# Patient Record
Sex: Female | Born: 1950 | Race: White | Hispanic: No | Marital: Married | Smoking: Never smoker
Health system: Southern US, Community
[De-identification: ages and names within clinical notes are randomized; demographics above are authoritative.]

## PROBLEM LIST (undated history)

## (undated) DIAGNOSIS — N189 Chronic kidney disease, unspecified: Secondary | ICD-10-CM

## (undated) DIAGNOSIS — T7840XA Allergy, unspecified, initial encounter: Secondary | ICD-10-CM

## (undated) DIAGNOSIS — F329 Major depressive disorder, single episode, unspecified: Secondary | ICD-10-CM

## (undated) DIAGNOSIS — M545 Low back pain, unspecified: Secondary | ICD-10-CM

## (undated) DIAGNOSIS — F419 Anxiety disorder, unspecified: Secondary | ICD-10-CM

## (undated) DIAGNOSIS — K589 Irritable bowel syndrome without diarrhea: Secondary | ICD-10-CM

## (undated) DIAGNOSIS — Z9889 Other specified postprocedural states: Secondary | ICD-10-CM

## (undated) DIAGNOSIS — H9191 Unspecified hearing loss, right ear: Secondary | ICD-10-CM

## (undated) DIAGNOSIS — M329 Systemic lupus erythematosus, unspecified: Secondary | ICD-10-CM

## (undated) DIAGNOSIS — I480 Paroxysmal atrial fibrillation: Secondary | ICD-10-CM

## (undated) DIAGNOSIS — K5790 Diverticulosis of intestine, part unspecified, without perforation or abscess without bleeding: Secondary | ICD-10-CM

## (undated) DIAGNOSIS — M810 Age-related osteoporosis without current pathological fracture: Secondary | ICD-10-CM

## (undated) DIAGNOSIS — E119 Type 2 diabetes mellitus without complications: Secondary | ICD-10-CM

## (undated) DIAGNOSIS — R9439 Abnormal result of other cardiovascular function study: Secondary | ICD-10-CM

## (undated) DIAGNOSIS — G43909 Migraine, unspecified, not intractable, without status migrainosus: Secondary | ICD-10-CM

## (undated) DIAGNOSIS — G4730 Sleep apnea, unspecified: Secondary | ICD-10-CM

## (undated) DIAGNOSIS — I10 Essential (primary) hypertension: Secondary | ICD-10-CM

## (undated) DIAGNOSIS — E781 Pure hyperglyceridemia: Secondary | ICD-10-CM

## (undated) DIAGNOSIS — J189 Pneumonia, unspecified organism: Secondary | ICD-10-CM

## (undated) DIAGNOSIS — F32A Depression, unspecified: Secondary | ICD-10-CM

## (undated) DIAGNOSIS — G8929 Other chronic pain: Secondary | ICD-10-CM

## (undated) DIAGNOSIS — R112 Nausea with vomiting, unspecified: Secondary | ICD-10-CM

## (undated) DIAGNOSIS — R498 Other voice and resonance disorders: Secondary | ICD-10-CM

## (undated) DIAGNOSIS — M1990 Unspecified osteoarthritis, unspecified site: Secondary | ICD-10-CM

## (undated) DIAGNOSIS — K635 Polyp of colon: Secondary | ICD-10-CM

## (undated) DIAGNOSIS — E039 Hypothyroidism, unspecified: Secondary | ICD-10-CM

## (undated) HISTORY — PX: CARDIAC CATHETERIZATION: SHX172

## (undated) HISTORY — DX: Chronic kidney disease, unspecified: N18.9

## (undated) HISTORY — DX: Unspecified osteoarthritis, unspecified site: M19.90

## (undated) HISTORY — PX: COLONOSCOPY: SHX174

## (undated) HISTORY — DX: Other voice and resonance disorders: R49.8

## (undated) HISTORY — DX: Depression, unspecified: F32.A

## (undated) HISTORY — DX: Polyp of colon: K63.5

## (undated) HISTORY — DX: Allergy, unspecified, initial encounter: T78.40XA

## (undated) HISTORY — DX: Pure hyperglyceridemia: E78.1

## (undated) HISTORY — DX: Irritable bowel syndrome, unspecified: K58.9

## (undated) HISTORY — PX: FRACTURE SURGERY: SHX138

## (undated) HISTORY — DX: Migraine, unspecified, not intractable, without status migrainosus: G43.909

## (undated) HISTORY — DX: Sleep apnea, unspecified: G47.30

## (undated) HISTORY — DX: Age-related osteoporosis without current pathological fracture: M81.0

## (undated) HISTORY — DX: Diverticulosis of intestine, part unspecified, without perforation or abscess without bleeding: K57.90

## (undated) HISTORY — DX: Major depressive disorder, single episode, unspecified: F32.9

## (undated) HISTORY — PX: OTHER SURGICAL HISTORY: SHX169

## (undated) HISTORY — DX: Unspecified hearing loss, right ear: H91.91

## (undated) HISTORY — DX: Anxiety disorder, unspecified: F41.9

## (undated) HISTORY — DX: Paroxysmal atrial fibrillation: I48.0

## (undated) HISTORY — DX: Essential (primary) hypertension: I10

---

## 1986-02-22 HISTORY — PX: ABDOMINAL HYSTERECTOMY: SHX81

## 2003-05-08 DIAGNOSIS — Z86010 Personal history of colonic polyps: Secondary | ICD-10-CM

## 2003-05-08 DIAGNOSIS — Z860101 Personal history of adenomatous and serrated colon polyps: Secondary | ICD-10-CM

## 2003-05-08 HISTORY — DX: Personal history of adenomatous and serrated colon polyps: Z86.0101

## 2003-05-08 HISTORY — DX: Personal history of colonic polyps: Z86.010

## 2006-02-22 HISTORY — PX: COCHLEAR IMPLANT: SUR684

## 2009-07-17 ENCOUNTER — Ambulatory Visit (HOSPITAL_COMMUNITY): Admission: RE | Admit: 2009-07-17 | Discharge: 2009-07-17 | Payer: Self-pay | Admitting: Otolaryngology

## 2010-05-11 LAB — DIFFERENTIAL
Eosinophils Absolute: 0.2 10*3/uL (ref 0.0–0.7)
Eosinophils Relative: 2 % (ref 0–5)
Lymphocytes Relative: 30 % (ref 12–46)
Monocytes Absolute: 0.6 10*3/uL (ref 0.1–1.0)
Neutrophils Relative %: 61 % (ref 43–77)

## 2010-05-11 LAB — COMPREHENSIVE METABOLIC PANEL
ALT: 24 U/L (ref 0–35)
AST: 17 U/L (ref 0–37)
Calcium: 9.7 mg/dL (ref 8.4–10.5)
Chloride: 105 mEq/L (ref 96–112)
Creatinine, Ser: 0.84 mg/dL (ref 0.4–1.2)
Glucose, Bld: 95 mg/dL (ref 70–99)
Potassium: 3.7 mEq/L (ref 3.5–5.1)
Sodium: 140 mEq/L (ref 135–145)

## 2010-05-11 LAB — CBC
HCT: 38.7 % (ref 36.0–46.0)
MCV: 86.7 fL (ref 78.0–100.0)
Platelets: 302 10*3/uL (ref 150–400)
RBC: 4.46 MIL/uL (ref 3.87–5.11)
RDW: 14.4 % (ref 11.5–15.5)
WBC: 9.5 10*3/uL (ref 4.0–10.5)

## 2010-05-11 LAB — PROTIME-INR
INR: 1.04 (ref 0.00–1.49)
Prothrombin Time: 13.5 seconds (ref 11.6–15.2)

## 2010-05-11 LAB — URINALYSIS, ROUTINE W REFLEX MICROSCOPIC
Bilirubin Urine: NEGATIVE
Specific Gravity, Urine: 1.021 (ref 1.005–1.030)
Urobilinogen, UA: 0.2 mg/dL (ref 0.0–1.0)

## 2010-05-11 LAB — GLUCOSE, CAPILLARY
Glucose-Capillary: 132 mg/dL — ABNORMAL HIGH (ref 70–99)
Glucose-Capillary: 138 mg/dL — ABNORMAL HIGH (ref 70–99)

## 2010-05-11 LAB — APTT: aPTT: 27 seconds (ref 24–37)

## 2013-04-05 LAB — HM COLONOSCOPY

## 2014-09-30 DIAGNOSIS — I4819 Other persistent atrial fibrillation: Secondary | ICD-10-CM | POA: Insufficient documentation

## 2014-09-30 DIAGNOSIS — G4733 Obstructive sleep apnea (adult) (pediatric): Secondary | ICD-10-CM | POA: Insufficient documentation

## 2014-09-30 DIAGNOSIS — E785 Hyperlipidemia, unspecified: Secondary | ICD-10-CM | POA: Insufficient documentation

## 2014-09-30 DIAGNOSIS — E119 Type 2 diabetes mellitus without complications: Secondary | ICD-10-CM | POA: Insufficient documentation

## 2014-09-30 DIAGNOSIS — I10 Essential (primary) hypertension: Secondary | ICD-10-CM | POA: Insufficient documentation

## 2016-04-12 LAB — BASIC METABOLIC PANEL
BUN: 16 mg/dL (ref 4–21)
CREATININE: 0.8 mg/dL (ref <=1.1)
GLUCOSE: 126 mg/dL
POTASSIUM: 3.8 mmol/L (ref 3.4–5.3)
Sodium: 141 mmol/L (ref 137–147)

## 2016-04-12 LAB — LIPID PANEL
CHOLESTEROL: 129 mg/dL (ref 0–200)
HDL: 37 mg/dL (ref 35–70)
LDL Cholesterol: 49 mg/dL
Triglycerides: 429 mg/dL — AB (ref 40–160)

## 2016-04-12 LAB — HEPATIC FUNCTION PANEL
ALK PHOS: 71 U/L (ref 25–125)
ALT: 14 U/L (ref 7–35)
AST: 13 U/L (ref 13–35)
BILIRUBIN, TOTAL: 0.3 mg/dL

## 2016-04-12 LAB — HEMOGLOBIN A1C: HEMOGLOBIN A1C: 6.8

## 2016-04-12 LAB — CBC AND DIFFERENTIAL
HCT: 38 % (ref 36–46)
Hemoglobin: 13 g/dL (ref 12.0–16.0)
PLATELETS: 361 10*3/uL (ref 150–399)
WBC: 8.5 10*3/mL

## 2016-04-12 LAB — TSH: TSH: 2.27 u[IU]/mL (ref <=5.90)

## 2016-04-12 LAB — MICROALBUMIN, URINE: MICROALB UR: 0.87

## 2016-04-13 LAB — HM DIABETES FOOT EXAM: HM DIABETIC FOOT EXAM: NORMAL

## 2016-04-13 LAB — HM DIABETES EYE EXAM

## 2016-05-14 ENCOUNTER — Ambulatory Visit
Admission: RE | Admit: 2016-05-14 | Discharge: 2016-05-14 | Disposition: A | Discharge status: Home / Self Care | Payer: Self-pay | Source: Ambulatory Visit | Attending: Internal Medicine | Admitting: Internal Medicine

## 2016-05-14 ENCOUNTER — Other Ambulatory Visit: Payer: Self-pay | Admitting: Internal Medicine

## 2016-05-14 DIAGNOSIS — M533 Sacrococcygeal disorders, not elsewhere classified: Secondary | ICD-10-CM

## 2016-06-29 ENCOUNTER — Ambulatory Visit (INDEPENDENT_AMBULATORY_CARE_PROVIDER_SITE_OTHER): Payer: BLUE CROSS/BLUE SHIELD | Admitting: Physician Assistant

## 2016-06-29 ENCOUNTER — Encounter: Payer: Self-pay | Admitting: Physician Assistant

## 2016-06-29 VITALS — BP 122/70 | HR 59 | Temp 98.3°F | Resp 14 | Ht 68.0 in | Wt 217.0 lb

## 2016-06-29 DIAGNOSIS — Z23 Encounter for immunization: Secondary | ICD-10-CM

## 2016-06-29 DIAGNOSIS — E785 Hyperlipidemia, unspecified: Secondary | ICD-10-CM | POA: Diagnosis not present

## 2016-06-29 DIAGNOSIS — Z1231 Encounter for screening mammogram for malignant neoplasm of breast: Secondary | ICD-10-CM

## 2016-06-29 DIAGNOSIS — Z780 Asymptomatic menopausal state: Secondary | ICD-10-CM

## 2016-06-29 DIAGNOSIS — Z1239 Encounter for other screening for malignant neoplasm of breast: Secondary | ICD-10-CM

## 2016-06-29 DIAGNOSIS — H8109 Meniere's disease, unspecified ear: Secondary | ICD-10-CM | POA: Insufficient documentation

## 2016-06-29 DIAGNOSIS — I10 Essential (primary) hypertension: Secondary | ICD-10-CM | POA: Diagnosis not present

## 2016-06-29 DIAGNOSIS — M5136 Other intervertebral disc degeneration, lumbar region: Secondary | ICD-10-CM

## 2016-06-29 DIAGNOSIS — I4891 Unspecified atrial fibrillation: Secondary | ICD-10-CM

## 2016-06-29 DIAGNOSIS — E119 Type 2 diabetes mellitus without complications: Secondary | ICD-10-CM

## 2016-06-29 NOTE — Progress Notes (Signed)
Patient presents to clinic today to establish care.  Previous PCP -- Dr. Rene Paci.   Acute Concerns: Has noted voice change over the past 2 years -- notes strain in voice.   Is not a smoker.  Sometimes dry cough.  Globus sensation from time to time. Denies dyspahgia or odynophagia. Endorses history of sinus drainage. No current symptoms.   Chronic Issues: Hypothyroidism -- Unspecified per patient. Currently on levothyroxine 75 mcg daily. Is taking daily as directed.  Denies any significant unexplained weight changes. Last TSH with previous PCP on 04/12/16. Normal on review. Will abstract results into chart.  Chronic Low Back Pain -- Is taking Cymbalta 60 mg daily for chronic lower back pain. Takes daily as directed. Patient states it is hard to tell to what extent the medication helps. Is currently seeing chiropractor 3 x week for 8 weeks. Has noted significant improvement in symptoms. Patient notes prior x-ray and MRI revealing some Is also followed by Orthopedics. Seeing about once per year.   Hyperlipidemia -- Is currently on Simvastatin 20 mg three times weekly. Notes good control of cholesterol with this regimen. .   Atrial Fibrillation/Hypertension -- Diagnosed 4 years ago. Is currently on regimen of Xarelto 20 mg daily, Norvasc 10 mg daily, Hydralazine 50 mg BID, Sotalol 80 mg BID and Diovan 160 mg daily. Is taking as directed. Patient denies history of DVT, PE, MI or CVA. Endorses + family history of CVA. Has been following a low-salt diet. Patient denies chest pain, palpitations, lightheadedness, dizziness, vision changes or frequent headaches. Is followed by Cardiology -- Dr. Clarene Essex.  Patient is requesting a new specialist in the area and is wondering if the amount of medications can potentially be reduced.  DM II -- Metformin 500 mg BID. Is taking medication as directed. Endorses diet is well overall but can always be improved. Gym at work  Past Medical History:  Diagnosis Date  .  Anxiety   . Arthritis   . Colon polyps   . Deafness in right ear   . Depression   . Diabetes mellitus without complication (HCC)   . Diverticulosis   . Hypertension   . Migraine   . Sleep apnea   . Thyroid disease     Past Surgical History:  Procedure Laterality Date  . ABDOMINAL HYSTERECTOMY  1988  . COCHLEAR IMPLANT  2008    No current outpatient prescriptions on file prior to visit.   No current facility-administered medications on file prior to visit.     Allergies  Allergen Reactions  . Doxycycline Rash  . Erythromycin Rash  . Lisinopril Cough  . Tetracyclines & Related Rash    Family History  Problem Relation Age of Onset  . Hypertension Mother   . Diabetes Mother   . Heart attack Mother   . Hypertension Father   . Heart attack Father   . Stroke Father   . Cancer Sister        Breast  . Hypertension Brother   . Cancer Brother        Prostate  . Diabetes Brother   . Dementia Brother   . Stroke Brother   . Heart attack Brother   . Hypertension Son   . Cancer Maternal Aunt        Breast  . Cancer Maternal Uncle        Lung    Social History   Social History  . Marital status: Married    Spouse name: N/A  .  Number of children: N/A  . Years of education: 1612   Occupational History  . General Manager    Social History Main Topics  . Smoking status: Never Smoker  . Smokeless tobacco: Never Used  . Alcohol use No  . Drug use: No  . Sexual activity: Yes   Other Topics Concern  . Not on file   Social History Narrative  . No narrative on file   Review of Systems  Constitutional: Negative for fever and weight loss.  Eyes: Negative for blurred vision.  Respiratory: Positive for cough. Negative for hemoptysis, sputum production, shortness of breath and wheezing.   Cardiovascular: Negative for chest pain and palpitations.  Gastrointestinal: Negative for abdominal pain, blood in stool and melena.  Neurological: Negative for dizziness and loss  of consciousness.  Psychiatric/Behavioral: Negative for depression and suicidal ideas. The patient is not nervous/anxious.    BP 122/70   Pulse (!) 59   Temp 98.3 F (36.8 C) (Oral)   Resp 14   Ht 5\' 8"  (1.727 m)   Wt 217 lb (98.4 kg)   SpO2 96%   BMI 32.99 kg/m   Physical Exam  Constitutional: She is oriented to person, place, and time and well-developed, well-nourished, and in no distress.  HENT:  Head: Normocephalic and atraumatic.  Right Ear: External ear normal.  Left Ear: External ear normal.  Nose: Nose normal.  Mouth/Throat: Oropharynx is clear and moist. No oropharyngeal exudate.  Eyes: Conjunctivae are normal. Pupils are equal, round, and reactive to light.  Neck: Neck supple.  Cardiovascular: Normal rate, regular rhythm, normal heart sounds and intact distal pulses.   Pulmonary/Chest: Effort normal and breath sounds normal. No respiratory distress. She has no wheezes. She has no rales. She exhibits no tenderness.  Abdominal: Soft. Bowel sounds are normal. She exhibits no distension. There is no tenderness.  Lymphadenopathy:    She has no cervical adenopathy.  Neurological: She is alert and oriented to person, place, and time.  Skin: Skin is warm and dry. No rash noted.  Psychiatric: Affect normal.  Vitals reviewed.  Assessment/Plan: Atrial fibrillation [facility](HCC) [facility] placed per patient preference. Will continue current regimen for now.   Hypertension BP controlled. Asymptomatic. Will follow.  Hyperlipemia Taking statin as directed. Discussed dietary and exercise recommendations with the patient. Followed by Cardiology. We are setting her up with new specialist as requested. Will obtain prior records for review.  Diabetes mellitus, type 2 (HCC) Taking medications as directed and endorses tolerating well. Will obtain previous records with recent labs for review. Follow-up and repeat labs based on these numbers. Immunizations updated today. Foot  and eye examinations up-to-date per patient.   DDD (degenerative disc disease), lumbar With chronic pain. Is doing well with her Cymbalta.  Is getting adjustments with Chiropractor which is helping quite a lot. Will monitor.    Piedad ClimesMartin, Arthur Aydelotte Cody, PA-C

## 2016-06-29 NOTE — Progress Notes (Signed)
Pre visit review using our clinic review tool, if applicable. No additional management support is needed unless otherwise documented below in the visit note. 

## 2016-06-29 NOTE — Patient Instructions (Signed)
Please continue chronic medications.  We will work on getting an extended release Meformin approved through insurance so we can cut back on the number of pills you are taking per day.   You will be contacted for a mammogram, bone density test (screening for osteoporosis) and by Cardiology to be set up with a new provider. Discuss tweaking cardiac medications with them.   We will try to get records from previous providers for review. The lab results you have given me will be placed in your chart here.  Everything looks good. You are due for repeat check of diabetes in 1 month. Schedule that with me today before leaving.  It was a pleasure meeting you!

## 2016-07-01 ENCOUNTER — Encounter: Payer: Self-pay | Admitting: Emergency Medicine

## 2016-07-04 NOTE — Assessment & Plan Note (Signed)
Referral to [facility] placed per patient preference. Will continue current regimen for now.

## 2016-07-04 NOTE — Assessment & Plan Note (Signed)
BP controlled. Asymptomatic. Will follow.

## 2016-07-04 NOTE — Assessment & Plan Note (Signed)
With chronic pain. Is doing well with her Cymbalta.  Is getting adjustments with Chiropractor which is helping quite a lot. Will monitor.

## 2016-07-04 NOTE — Assessment & Plan Note (Signed)
Taking statin as directed. Discussed dietary and exercise recommendations with the patient. Followed by Cardiology. We are setting her up with new specialist as requested. Will obtain prior records for review.

## 2016-07-04 NOTE — Assessment & Plan Note (Signed)
Taking medications as directed and endorses tolerating well. Will obtain previous records with recent labs for review. Follow-up and repeat labs based on these numbers. Immunizations updated today. Foot and eye examinations up-to-date per patient.

## 2016-07-12 ENCOUNTER — Encounter: Payer: Self-pay | Admitting: Physician Assistant

## 2016-07-12 DIAGNOSIS — R490 Dysphonia: Secondary | ICD-10-CM

## 2016-07-13 ENCOUNTER — Other Ambulatory Visit (HOSPITAL_COMMUNITY): Payer: Self-pay | Admitting: Emergency Medicine

## 2016-07-13 MED ORDER — POTASSIUM CHLORIDE CRYS ER 20 MEQ PO TBCR: 20.0000 meq | EXTENDED_RELEASE_TABLET | Freq: Two times a day (BID) | ORAL | 0 refills | Status: DC

## 2016-07-13 NOTE — Telephone Encounter (Signed)
Please see patient's MyChart message. Check on status of Cardiology referral and check on mammogram as patient has not been contacted to schedule.

## 2016-07-21 ENCOUNTER — Encounter: Payer: Self-pay | Admitting: *Deleted

## 2016-07-28 ENCOUNTER — Other Ambulatory Visit: Payer: Self-pay | Admitting: Physician Assistant

## 2016-07-28 DIAGNOSIS — Z1231 Encounter for screening mammogram for malignant neoplasm of breast: Secondary | ICD-10-CM

## 2016-08-02 NOTE — Progress Notes (Signed)
History of Present Illness: Patient is a 66 y.o. female who presents to clinic today for follow-up of Diabetes Mellitus II, previously controlled without complication. Patient currently on medication regimen of Metformin 500 mg BID.  Is taking medications as directed. Endorses some sweets from time to time but eats mainly broiled or baked foods and low carb diet.  Denies polyuria, polydipsia or polyphagia. Denies vision changes, numbness or tingling in the hands or the neck. Is not checking blood glucose as directed.   Latest Maintenance: A1C --  Lab Results  Component Value Date   HGBA1C 6.8 04/12/2016   Diabetic Eye Exam -- up-to-date. No retinopathy. Urine Microalbumin -- Patient on ARB. Last Urine microalbumin 03/2016. Foot Exam -- up-to-date. Denies current concerns.  Past Medical History:  Diagnosis Date  . Anxiety   . Arthritis   . Colon polyps   . Deafness in right ear   . Depression   . Diabetes mellitus without complication (HCC)   . Diverticulosis   . Hypertension   . Migraine   . Sleep apnea   . Thyroid disease     Current Outpatient Prescriptions on File Prior to Visit  Medication Sig Dispense Refill  . amLODipine (NORVASC) 10 MG tablet Take 1 tablet by mouth daily.  2  . DULoxetine (CYMBALTA) 60 MG capsule Take 1 capsule by mouth daily.  4  . hydrALAZINE (APRESOLINE) 50 MG tablet Take 1 tablet by mouth 2 (two) times daily.  7  . indapamide (LOZOL) 2.5 MG tablet Take 1 tablet by mouth daily.  5  . levothyroxine (SYNTHROID, LEVOTHROID) 75 MCG tablet Take 1 tablet by mouth daily.  2  . metFORMIN (GLUCOPHAGE) 500 MG tablet Take 1 tablet by mouth 2 (two) times daily with a meal.  5  . Omega-3 Fatty Acids (FISH OIL) 1000 MG CAPS Take 1 capsule by mouth daily.    . potassium chloride SA (K-DUR,KLOR-CON) 20 MEQ tablet Take 1 tablet (20 mEq total) by mouth 2 (two) times daily. 60 tablet 0  . simvastatin (ZOCOR) 20 MG tablet Take 1 tablet by mouth 3 (three) times a week.   0  . sotalol (BETAPACE) 80 MG tablet Take 1 tablet by mouth 2 (two) times daily.  4  . valsartan (DIOVAN) 160 MG tablet Take 1 tablet by mouth daily.  5  . XARELTO 20 MG TABS tablet Take 1 tablet by mouth daily.  5   No current facility-administered medications on file prior to visit.     Allergies  Allergen Reactions  . Doxycycline Rash  . Erythromycin Rash  . Lisinopril Cough  . Tetracyclines & Related Rash    Family History  Problem Relation Age of Onset  . Hypertension Mother   . Diabetes Mother   . Heart attack Mother   . Hypertension Father   . Heart attack Father   . Stroke Father   . Cancer Sister        Breast  . Hypertension Brother   . Cancer Brother        Prostate  . Diabetes Brother   . Dementia Brother   . Stroke Brother   . Heart attack Brother   . Hypertension Son   . Cancer Maternal Aunt        Breast  . Cancer Maternal Uncle        Lung    Social History   Social History  . Marital status: Married    Spouse name: N/A  . Number  of children: N/A  . Years of education: 6   Occupational History  . General Manager    Social History Main Topics  . Smoking status: Never Smoker  . Smokeless tobacco: Never Used  . Alcohol use No  . Drug use: No  . Sexual activity: Yes   Other Topics Concern  . None   Social History Narrative  . None   Review of Systems: Pertinent ROS are listed in HPI  Physical Examination: BP 118/70   Pulse (!) 59   Temp 98 F (36.7 C) (Oral)   Resp 14   Ht 5\' 8"  (1.727 m)   Wt 218 lb (98.9 kg)   SpO2 98%   BMI 33.15 kg/m  General appearance: alert, cooperative, appears stated age and no distress Lungs: clear to auscultation bilaterally Heart: regular rate and rhythm, S1, S2 normal, no murmur, click, rub or gallop Neurologic: Alert and oriented X 3, normal strength and tone. Normal symmetric reflexes. Normal coordination and gait   Assessment/Plan: Atrial fibrillation (HCC) Managed by Cardiology.  Continue current regimen.  Diabetes mellitus, type 2 (HCC) Taking medications as directed. Up-to-date on health maintenance. Repeat labs today. Diet and exercise reviewed. Will alter regimen based on results.

## 2016-08-03 ENCOUNTER — Encounter: Payer: Self-pay | Admitting: Physician Assistant

## 2016-08-03 ENCOUNTER — Ambulatory Visit (INDEPENDENT_AMBULATORY_CARE_PROVIDER_SITE_OTHER): Payer: BLUE CROSS/BLUE SHIELD | Admitting: Physician Assistant

## 2016-08-03 VITALS — BP 118/70 | HR 59 | Temp 98.0°F | Resp 14 | Ht 68.0 in | Wt 218.0 lb

## 2016-08-03 DIAGNOSIS — I482 Chronic atrial fibrillation, unspecified: Secondary | ICD-10-CM

## 2016-08-03 DIAGNOSIS — E119 Type 2 diabetes mellitus without complications: Secondary | ICD-10-CM | POA: Diagnosis not present

## 2016-08-03 LAB — COMPREHENSIVE METABOLIC PANEL
ALT: 21 U/L (ref 0–35)
AST: 14 U/L (ref 0–37)
Albumin: 4.1 g/dL (ref 3.5–5.2)
Alkaline Phosphatase: 66 U/L (ref 39–117)
BUN: 21 mg/dL (ref 6–23)
CALCIUM: 9.9 mg/dL (ref 8.4–10.5)
CHLORIDE: 105 meq/L (ref 96–112)
CO2: 28 meq/L (ref 19–32)
Creatinine, Ser: 0.91 mg/dL (ref 0.40–1.20)
GFR: 65.89 mL/min (ref >60.00)
Glucose, Bld: 139 mg/dL — ABNORMAL HIGH (ref 70–99)
POTASSIUM: 4.1 meq/L (ref 3.5–5.1)
SODIUM: 140 meq/L (ref 135–145)
Total Bilirubin: 0.4 mg/dL (ref 0.2–1.2)
Total Protein: 6.4 g/dL (ref 6.0–8.3)

## 2016-08-03 LAB — HEMOGLOBIN A1C: Hgb A1c MFr Bld: 7.1 % — ABNORMAL HIGH (ref 4.6–6.5)

## 2016-08-03 NOTE — Progress Notes (Signed)
Pre visit review using our clinic review tool, if applicable. No additional management support is needed unless otherwise documented below in the visit note. 

## 2016-08-03 NOTE — Assessment & Plan Note (Signed)
Taking medications as directed. Up-to-date on health maintenance. Repeat labs today. Diet and exercise reviewed. Will alter regimen based on results.

## 2016-08-03 NOTE — Assessment & Plan Note (Signed)
Managed by Cardiology. Continue current regimen 

## 2016-08-03 NOTE — Patient Instructions (Addendum)
Please go to the lab for blood work.  I will call you with your results.   Continue current medication regimen for now. For the back pain -- please try to take Tylenol Arthritis to help with pain. Start yoga and/or water aerobics.  Let me know if this starts to help. We may need to get an orthopedist or physical therapy back on board.  [facility] at [phone] to schedule your appointment.   Stop by the front desk to speak with [place] or Ashley Woodard about the mammogram and bone density test.   Follow-up will be based on results.   Diabetes Mellitus and Exercise Exercising regularly is important for your overall health, especially when you have diabetes (diabetes mellitus). Exercising is not only about losing weight. It has many health benefits, such as increasing muscle strength and bone density and reducing body fat and stress. This leads to improved fitness, flexibility, and endurance, all of which result in better overall health. Exercise has additional benefits for people with diabetes, including:  Reducing appetite.  Helping to lower and control blood glucose.  Lowering blood pressure.  Helping to control amounts of fatty substances (lipids) in the blood, such as cholesterol and triglycerides.  Helping the body to respond better to insulin (improving insulin sensitivity).  Reducing how much insulin the body needs.  Decreasing the risk for heart disease by: ? Lowering cholesterol and triglyceride levels. ? Increasing the levels of good cholesterol. ? Lowering blood glucose levels.  What is my activity plan? Your health care provider or certified diabetes educator can help you make a plan for the type and frequency of exercise (activity plan) that works for you. Make sure that you:  Do at least 150 minutes of moderate-intensity or vigorous-intensity exercise each week. This could be brisk walking, biking, or water aerobics. ? Do stretching and strength  exercises, such as yoga or weightlifting, at least 2 times a week. ? Spread out your activity over at least 3 days of the week.  Get some form of physical activity every day. ? Do not go more than 2 days in a row without some kind of physical activity. ? Avoid being inactive for more than 90 minutes at a time. Take frequent breaks to walk or stretch.  Choose a type of exercise or activity that you enjoy, and set realistic goals.  Start slowly, and gradually increase the intensity of your exercise over time.  What do I need to know about managing my diabetes?  Check your blood glucose before and after exercising. ? If your blood glucose is higher than 240 mg/dL (29.513.3 mmol/L) before you exercise, check your urine for ketones. If you have ketones in your urine, do not exercise until your blood glucose returns to normal.  Know the symptoms of low blood glucose (hypoglycemia) and how to treat it. Your risk for hypoglycemia increases during and after exercise. Common symptoms of hypoglycemia can include: ? Hunger. ? Anxiety. ? Sweating and feeling clammy. ? Confusion. ? Dizziness or feeling light-headed. ? Increased heart rate or palpitations. ? Blurry vision. ? Tingling or numbness around the mouth, lips, or tongue. ? Tremors or shakes. ? Irritability.  Keep a rapid-acting carbohydrate snack available before, during, and after exercise to help prevent or treat hypoglycemia.  Avoid injecting insulin into areas of the body that are going to be exercised. For example, avoid injecting insulin into: ? The arms, when playing tennis. ? The legs, when jogging.  Keep records of  your exercise habits. Doing this can help you and your health care provider adjust your diabetes management plan as needed. Write down: ? Food that you eat before and after you exercise. ? Blood glucose levels before and after you exercise. ? The type and amount of exercise you have done. ? When your insulin is  expected to peak, if you use insulin. Avoid exercising at times when your insulin is peaking.  When you start a new exercise or activity, work with your health care provider to make sure the activity is safe for you, and to adjust your insulin, medicines, or food intake as needed.  Drink plenty of water while you exercise to prevent dehydration or heat stroke. Drink enough fluid to keep your urine clear or pale yellow. This information is not intended to replace advice given to you by your health care provider. Make sure you discuss any questions you have with your health care provider. Document Released: 05/01/2003 Document Revised: 08/29/2015 Document Reviewed: 07/21/2015 Elsevier Interactive Patient Education  2018 [employer].

## 2016-08-05 ENCOUNTER — Encounter: Payer: Self-pay | Admitting: Physician Assistant

## 2016-08-07 ENCOUNTER — Other Ambulatory Visit: Payer: Self-pay | Admitting: Physician Assistant

## 2016-08-08 ENCOUNTER — Encounter: Payer: Self-pay | Admitting: Physician Assistant

## 2016-08-09 ENCOUNTER — Other Ambulatory Visit: Payer: Self-pay | Admitting: Emergency Medicine

## 2016-08-09 DIAGNOSIS — E119 Type 2 diabetes mellitus without complications: Secondary | ICD-10-CM

## 2016-08-09 MED ORDER — GLUCOSE BLOOD VI STRP
ORAL_STRIP | 6 refills | Status: DC
Start: 2016-08-09 — End: 2017-06-15

## 2016-08-09 MED ORDER — LANCETS ULTRA THIN 30G MISC: 1.0000 | Freq: Every day | 6 refills | Status: DC

## 2016-08-11 ENCOUNTER — Encounter: Payer: Self-pay | Admitting: Cardiology

## 2016-08-11 ENCOUNTER — Ambulatory Visit (INDEPENDENT_AMBULATORY_CARE_PROVIDER_SITE_OTHER): Payer: BLUE CROSS/BLUE SHIELD | Admitting: Cardiology

## 2016-08-11 ENCOUNTER — Telehealth (HOSPITAL_COMMUNITY): Payer: Self-pay | Admitting: *Deleted

## 2016-08-11 VITALS — BP 124/66 | HR 54 | Ht 68.0 in | Wt 216.4 lb

## 2016-08-11 DIAGNOSIS — I481 Persistent atrial fibrillation: Secondary | ICD-10-CM

## 2016-08-11 DIAGNOSIS — G4733 Obstructive sleep apnea (adult) (pediatric): Secondary | ICD-10-CM

## 2016-08-11 DIAGNOSIS — R9431 Abnormal electrocardiogram [ECG] [EKG]: Secondary | ICD-10-CM

## 2016-08-11 DIAGNOSIS — I4819 Other persistent atrial fibrillation: Secondary | ICD-10-CM

## 2016-08-11 DIAGNOSIS — I10 Essential (primary) hypertension: Secondary | ICD-10-CM

## 2016-08-11 MED ORDER — VALSARTAN-HYDROCHLOROTHIAZIDE 160-25 MG PO TABS: 1.0000 | ORAL_TABLET | Freq: Every day | ORAL | 11 refills | Status: DC

## 2016-08-11 NOTE — Patient Instructions (Addendum)
Medication Instructions:  1) STOP LOZEL 2) STOP DIOVAN 3) START DIOVAN HCT 160-25mg  daily  Labwork: You will have labs drawn the same day as your [facility] appointment.  Testing/Procedures: Dr. Mayford Knifeurner recommends you have a NUCLEAR STRESS TEST.  Dr. Mayford Knifeurner recommends you have a HOME SLEEP STUDY. Our CPAP assistant will call you to arrange this.  Follow-Up: Your physician recommends that you schedule a follow-up appointment in 2 WEEKS in the [facility].  Your physician wants you to follow-up in: 6 months with Dr. Mayford Knifeurner. You will receive a reminder letter in the mail two months in advance. If you don't receive a letter, please call our office to schedule the follow-up appointment.   Any Other Special Instructions Will Be Listed Below (If Applicable).     If you need a refill on your cardiac medications before your next appointment, please call your pharmacy.

## 2016-08-11 NOTE — Telephone Encounter (Signed)
Left message on voicemail per DPR in reference to upcoming appointment scheduled on 08/17/16 with detailed instructions given per Myocardial Perfusion Study Information Sheet for the test. LM to arrive 15 minutes early, and that it is imperative to arrive on time for appointment to keep from having the test rescheduled. If you need to cancel or reschedule your appointment, please call the office within 24 hours of your appointment. Failure to do so may result in a cancellation of your appointment, and a $50 no show fee. Phone number given for call back for any questions. Kezia Benevides Jacqueline     

## 2016-08-11 NOTE — Progress Notes (Addendum)
Cardiology Office Note    Date:  08/11/2016   ID:  Ashley Woodard, DOB 13-Jan-1951, MRN [id number]  PCP:  Waldon Merl, PA-C  Cardiologist:  Armanda Magic, MD   Chief Complaint  Patient presents with  . Atrial Fibrillation  . Hypertension  . Sleep Apnea    History of Present Illness:  Ashley Woodard is is referred here to Cardiology today for evaluation of atrial fibrillation at the request of Marcelline Mates, PAC.  Ashley Woodard is a 66 y.o. female with a history of DM, HTN, OSA and persistent atrial fibrillation.  She was followed in the past by a Cardiologist in [place] and is now moving her care here.  She has had atrial fibrillation off and on for about 8 years.  She has never had a cardioversion.  She rarely will have a breakthrough.  She has a history of OSA but was mild and she did not want to pursue CPAP.  She denies any chest pain or pressure, SOB, DOE, PND, orthopnea, awakening gasping for air, dizziness, claudication or syncope.  She rarely will have some mild LE edema.      Past Medical History:  Diagnosis Date  . Anxiety   . Arthritis   . Colon polyps   . Deafness in right ear   . Depression   . Diabetes mellitus without complication (HCC)   . Diverticulosis   . Hypertension   . Migraine   . Sleep apnea   . Thyroid disease     Past Surgical History:  Procedure Laterality Date  . ABDOMINAL HYSTERECTOMY  1988  . COCHLEAR IMPLANT  2008    Current Medications: Current Meds  Medication Sig  . amLODipine (NORVASC) 10 MG tablet Take 1 tablet by mouth daily.  . DULoxetine (CYMBALTA) 60 MG capsule Take 1 capsule by mouth daily.  Marland Kitchen glucose blood (CONTOUR NEXT TEST) test strip Check blood sugars daily  . hydrALAZINE (APRESOLINE) 50 MG tablet Take 1 tablet by mouth 2 (two) times daily.  . indapamide (LOZOL) 2.5 MG tablet Take 1 tablet by mouth daily.  Marland Kitchen LANCETS ULTRA THIN 30G MISC 1 Stick by Does not apply route daily.  Marland Kitchen levothyroxine (SYNTHROID, LEVOTHROID) 75 MCG tablet  Take 1 tablet by mouth daily.  . metFORMIN (GLUCOPHAGE) 500 MG tablet Take 1 tablet by mouth 2 (two) times daily with a meal.  . Omega-3 Fatty Acids (FISH OIL) 1000 MG CAPS Take 1 capsule by mouth daily.  . potassium chloride SA (K-DUR,KLOR-CON) 20 MEQ tablet TAKE 1 TABLET BY MOUTH TWICE DAILY  . simvastatin (ZOCOR) 20 MG tablet Take 1 tablet by mouth 3 (three) times a week.  . sotalol (BETAPACE) 80 MG tablet Take 1 tablet by mouth 2 (two) times daily.  . valsartan (DIOVAN) 160 MG tablet Take 1 tablet by mouth daily.  Carlena Hurl 20 MG TABS tablet Take 1 tablet by mouth daily.    Allergies:   Doxycycline; Erythromycin; Lisinopril; and Tetracyclines & related   Social History   Social History  . Marital status: Married    Spouse name: N/A  . Number of children: N/A  . Years of education: 31   Occupational History  . General Manager    Social History Main Topics  . Smoking status: Never Smoker  . Smokeless tobacco: Never Used  . Alcohol use No  . Drug use: No  . Sexual activity: Yes   Other Topics Concern  . None   Social History Narrative  .  None     Family History:  The patient's family history includes Cancer in her brother, maternal aunt, maternal uncle, and sister; Dementia in her brother; Diabetes in her brother and mother; Heart attack in her brother, father, and mother; Hypertension in her brother, father, mother, and son; Stroke in her brother and father.   ROS:   Please see the history of present illness.    ROS All other systems reviewed and are negative.  No flowsheet data found.     PHYSICAL EXAM:   VS:  BP 124/66   Pulse (!) 54   Ht 5\' 8"  (1.727 m)   Wt 216 lb 6.4 oz (98.2 kg)   BMI 32.90 kg/m    GEN: Well nourished, well developed, in no acute distress  HEENT: normal  Neck: no JVD, carotid bruits, or masses Cardiac: RRR; no murmurs, rubs, or gallops,no edema.  Intact distal pulses bilaterally.  Respiratory:  clear to auscultation bilaterally,  normal work of breathing GI: soft, nontender, nondistended, + BS MS: no deformity or atrophy  Skin: warm and dry, no rash Neuro:  Alert and Oriented x 3, Strength and sensation are intact Psych: euthymic mood, full affect  Wt Readings from Last 3 Encounters:  08/11/16 216 lb 6.4 oz (98.2 kg)  08/03/16 218 lb (98.9 kg)  06/29/16 217 lb (98.4 kg)      Studies/Labs Reviewed:   EKG:  EKG is ordered today.  The ekg ordered today demonstrates sinus bradycardia at 54bpm with T abnormality in the anterior precordial leads.   Recent Labs: 04/12/2016: Hemoglobin 13.0; Platelets 361; TSH 2.27 08/03/2016: ALT 21; BUN 21; Creatinine, Ser 0.91; Potassium 4.1; Sodium 140   Lipid Panel    Component Value Date/Time   CHOL 129 04/12/2016   TRIG 429 (A) 04/12/2016   HDL 37 04/12/2016   LDLCALC 49 04/12/2016    Additional studies/ records that were reviewed today include:  Office visit notes from PCP    ASSESSMENT:    1. Persistent atrial fibrillation (HCC)   2. Essential hypertension   3. Abnormal EKG   4. OSA (obstructive sleep apnea)      PLAN:  In order of problems listed above:  1. Persistent atrial fibrillation - she is maintaining sinus bradycardia.  Her CHADS2VASC score is 4.  She will continue on Xarelto 20mg  daily and Betapace 80mg  BID.  QTC stable on EKG at . 2. HTN - Her BP is adequately controlled on exam today.  She will continue on amlodipine 10mg  daily and hydralazine 50mg  BID. She would like to get off some of her HTN meds so I have told her to stop her Lozol and I will change her Diovan to Diovan HCT 160/25mg  daily and repeat a BMET in1 week.  3. Abnormal EKG with T wave changes in the anterior precordial leads.  She has multiple CRFs including a strong family history of CAD, and also has HTN, DM and dyslipidemia. I will get a stress myoview to rule out silent ischemia.  4. OSA - this reportedly was mild in the past and she did not tolerate her CPAP mask.  She  used an oral device for a while but her dog ate it.  I will get a home sleep study to determine degree of OSA. 5.     Medication Adjustments/Labs and Tests Ordered: Current medicines are reviewed at length with the patient today.  Concerns regarding medicines are outlined above.  Medication changes, Labs and Tests ordered today are listed  in the Patient Instructions below.  There are no Patient Instructions on file for this visit.   Signed, Armanda Magicraci Pier Bosher, MD  08/11/2016 9:17 AM    [facility] [address], [place], [place]  [address] Phone: [phone]; Fax: [phone]

## 2016-08-16 ENCOUNTER — Telehealth: Payer: Self-pay | Admitting: *Deleted

## 2016-08-16 NOTE — Telephone Encounter (Addendum)
Home Sleep Study ordered through [facility] 08/16/2016.  Informed patient of upcoming home sleep study and patient understanding was verbalized. Patient [facility] Patient understands she will receive a call in a week or so. Patient understands to call if she does not receive the call in a timely manner. Patient agrees with treatment and thanked me for call Patient's last office notes (08/11/2016) [facility], order # [id number]. Fax# [phone].

## 2016-08-17 ENCOUNTER — Ambulatory Visit (HOSPITAL_COMMUNITY): Payer: BLUE CROSS/BLUE SHIELD | Attending: Cardiology

## 2016-08-17 DIAGNOSIS — R9439 Abnormal result of other cardiovascular function study: Secondary | ICD-10-CM | POA: Diagnosis not present

## 2016-08-17 DIAGNOSIS — I481 Persistent atrial fibrillation: Secondary | ICD-10-CM | POA: Diagnosis not present

## 2016-08-17 DIAGNOSIS — R9431 Abnormal electrocardiogram [ECG] [EKG]: Secondary | ICD-10-CM | POA: Diagnosis not present

## 2016-08-17 DIAGNOSIS — I4819 Other persistent atrial fibrillation: Secondary | ICD-10-CM

## 2016-08-17 LAB — MYOCARDIAL PERFUSION IMAGING
CHL CUP NUCLEAR SDS: 8
CHL CUP NUCLEAR SRS: 5
CHL CUP RESTING HR STRESS: 53 {beats}/min
CSEPPHR: 105 {beats}/min
LV dias vol: 105 mL (ref 46–106)
LVSYSVOL: 38 mL
NUC STRESS TID: 1.09
RATE: 0.31
SSS: 13

## 2016-08-17 MED ORDER — REGADENOSON 0.4 MG/5ML IV SOLN
0.4000 mg | Freq: Once | INTRAVENOUS | Status: AC
  Administered 2016-08-17: 0.4 mg via INTRAVENOUS

## 2016-08-17 MED ORDER — TECHNETIUM TC 99M TETROFOSMIN IV KIT
10.5000 | PACK | Freq: Once | INTRAVENOUS | Status: AC | PRN
  Administered 2016-08-17: 10.5 via INTRAVENOUS
  Filled 2016-08-17: qty 11

## 2016-08-17 MED ORDER — TECHNETIUM TC 99M TETROFOSMIN IV KIT
32.7000 | PACK | Freq: Once | INTRAVENOUS | Status: AC | PRN
  Administered 2016-08-17: 32.7 via INTRAVENOUS
  Filled 2016-08-17: qty 33

## 2016-08-19 ENCOUNTER — Ambulatory Visit (INDEPENDENT_AMBULATORY_CARE_PROVIDER_SITE_OTHER): Payer: BLUE CROSS/BLUE SHIELD | Admitting: Internal Medicine

## 2016-08-19 ENCOUNTER — Encounter: Payer: Self-pay | Admitting: Internal Medicine

## 2016-08-19 ENCOUNTER — Inpatient Hospital Stay (HOSPITAL_COMMUNITY)
Admission: EM | Admit: 2016-08-19 | Discharge: 2016-08-21 | DRG: 287 | Disposition: A | Discharge status: Home / Self Care | Payer: BLUE CROSS/BLUE SHIELD | Attending: Cardiology | Admitting: Cardiology

## 2016-08-19 ENCOUNTER — Encounter (HOSPITAL_COMMUNITY): Payer: Self-pay | Admitting: Emergency Medicine

## 2016-08-19 ENCOUNTER — Encounter: Payer: Self-pay | Admitting: *Deleted

## 2016-08-19 ENCOUNTER — Emergency Department (HOSPITAL_COMMUNITY): Payer: BLUE CROSS/BLUE SHIELD

## 2016-08-19 VITALS — BP 138/58 | HR 64 | Ht 68.0 in | Wt 215.0 lb

## 2016-08-19 DIAGNOSIS — R079 Chest pain, unspecified: Secondary | ICD-10-CM | POA: Diagnosis present

## 2016-08-19 DIAGNOSIS — Z7901 Long term (current) use of anticoagulants: Secondary | ICD-10-CM

## 2016-08-19 DIAGNOSIS — G4733 Obstructive sleep apnea (adult) (pediatric): Secondary | ICD-10-CM | POA: Diagnosis present

## 2016-08-19 DIAGNOSIS — Z79890 Hormone replacement therapy (postmenopausal): Secondary | ICD-10-CM

## 2016-08-19 DIAGNOSIS — I4819 Other persistent atrial fibrillation: Secondary | ICD-10-CM | POA: Diagnosis present

## 2016-08-19 DIAGNOSIS — Z881 Allergy status to other antibiotic agents status: Secondary | ICD-10-CM

## 2016-08-19 DIAGNOSIS — E119 Type 2 diabetes mellitus without complications: Secondary | ICD-10-CM | POA: Diagnosis present

## 2016-08-19 DIAGNOSIS — I480 Paroxysmal atrial fibrillation: Secondary | ICD-10-CM | POA: Diagnosis present

## 2016-08-19 DIAGNOSIS — I10 Essential (primary) hypertension: Secondary | ICD-10-CM | POA: Diagnosis present

## 2016-08-19 DIAGNOSIS — R9431 Abnormal electrocardiogram [ECG] [EKG]: Secondary | ICD-10-CM | POA: Diagnosis present

## 2016-08-19 DIAGNOSIS — Z888 Allergy status to other drugs, medicaments and biological substances status: Secondary | ICD-10-CM | POA: Diagnosis not present

## 2016-08-19 DIAGNOSIS — I25110 Atherosclerotic heart disease of native coronary artery with unstable angina pectoris: Secondary | ICD-10-CM | POA: Diagnosis present

## 2016-08-19 DIAGNOSIS — R9439 Abnormal result of other cardiovascular function study: Secondary | ICD-10-CM | POA: Diagnosis present

## 2016-08-19 DIAGNOSIS — Z9889 Other specified postprocedural states: Secondary | ICD-10-CM | POA: Diagnosis not present

## 2016-08-19 DIAGNOSIS — Z7984 Long term (current) use of oral hypoglycemic drugs: Secondary | ICD-10-CM | POA: Diagnosis not present

## 2016-08-19 DIAGNOSIS — I481 Persistent atrial fibrillation: Secondary | ICD-10-CM

## 2016-08-19 DIAGNOSIS — E785 Hyperlipidemia, unspecified: Secondary | ICD-10-CM | POA: Diagnosis present

## 2016-08-19 DIAGNOSIS — I200 Unstable angina: Secondary | ICD-10-CM

## 2016-08-19 DIAGNOSIS — I249 Acute ischemic heart disease, unspecified: Secondary | ICD-10-CM

## 2016-08-19 HISTORY — DX: Abnormal result of other cardiovascular function study: R94.39

## 2016-08-19 HISTORY — DX: Other specified postprocedural states: Z98.890

## 2016-08-19 LAB — CBC
HCT: 40.9 % (ref 36.0–46.0)
Hemoglobin: 13.3 g/dL (ref 12.0–15.0)
MCH: 27.8 pg (ref 26.0–34.0)
MCHC: 32.5 g/dL (ref 30.0–36.0)
MCV: 85.4 fL (ref 78.0–100.0)
PLATELETS: 350 10*3/uL (ref 150–400)
RBC: 4.79 MIL/uL (ref 3.87–5.11)
RDW: 15.2 % (ref 11.5–15.5)
WBC: 9 10*3/uL (ref 4.0–10.5)

## 2016-08-19 LAB — I-STAT TROPONIN, ED: TROPONIN I, POC: 0 ng/mL (ref 0.00–0.08)

## 2016-08-19 LAB — BASIC METABOLIC PANEL
ANION GAP: 9 (ref 5–15)
BUN: 15 mg/dL (ref 6–20)
CO2: 25 mmol/L (ref 22–32)
Calcium: 9.9 mg/dL (ref 8.9–10.3)
Chloride: 105 mmol/L (ref 101–111)
Creatinine, Ser: 0.92 mg/dL (ref 0.44–1.00)
Glucose, Bld: 107 mg/dL — ABNORMAL HIGH (ref 65–99)
POTASSIUM: 3.6 mmol/L (ref 3.5–5.1)
SODIUM: 139 mmol/L (ref 135–145)

## 2016-08-19 LAB — PROTIME-INR
INR: 1.4
Prothrombin Time: 17.3 seconds — ABNORMAL HIGH (ref 11.4–15.2)

## 2016-08-19 LAB — CBG MONITORING, ED: GLUCOSE-CAPILLARY: 102 mg/dL — AB (ref 65–99)

## 2016-08-19 LAB — GLUCOSE, CAPILLARY: Glucose-Capillary: 149 mg/dL — ABNORMAL HIGH (ref 65–99)

## 2016-08-19 LAB — TROPONIN I: Troponin I: <0.03 ng/mL (ref <0.03)

## 2016-08-19 MED ORDER — HYDRALAZINE HCL 50 MG PO TABS
50.0000 mg | ORAL_TABLET | Freq: Two times a day (BID) | ORAL | Status: DC
  Administered 2016-08-19 – 2016-08-21 (×4): 50 mg via ORAL
  Filled 2016-08-19 (×4): qty 1

## 2016-08-19 MED ORDER — ZOLPIDEM TARTRATE 5 MG PO TABS
5.0000 mg | ORAL_TABLET | Freq: Every evening | ORAL | Status: DC | PRN
  Administered 2016-08-19 – 2016-08-20 (×2): 5 mg via ORAL
  Filled 2016-08-19 (×2): qty 1

## 2016-08-19 MED ORDER — POTASSIUM CHLORIDE CRYS ER 20 MEQ PO TBCR
20.0000 meq | EXTENDED_RELEASE_TABLET | Freq: Two times a day (BID) | ORAL | Status: DC
  Administered 2016-08-19 – 2016-08-21 (×4): 20 meq via ORAL
  Filled 2016-08-19 (×3): qty 1
  Filled 2016-08-19: qty 2

## 2016-08-19 MED ORDER — VALSARTAN-HYDROCHLOROTHIAZIDE 160-25 MG PO TABS: 1.0000 | ORAL_TABLET | Freq: Every day | ORAL | Status: DC

## 2016-08-19 MED ORDER — HYDROCHLOROTHIAZIDE 25 MG PO TABS
25.0000 mg | ORAL_TABLET | Freq: Every day | ORAL | Status: DC
  Administered 2016-08-21: 25 mg via ORAL
  Filled 2016-08-19 (×2): qty 1

## 2016-08-19 MED ORDER — SIMVASTATIN 20 MG PO TABS
20.0000 mg | ORAL_TABLET | ORAL | Status: DC
  Administered 2016-08-19: 20 mg via ORAL
  Filled 2016-08-19 (×2): qty 1

## 2016-08-19 MED ORDER — INSULIN ASPART 100 UNIT/ML ~~LOC~~ SOLN: 0.0000 [IU] | Freq: Every day | SUBCUTANEOUS | Status: DC

## 2016-08-19 MED ORDER — RIVAROXABAN 20 MG PO TABS
20.0000 mg | ORAL_TABLET | Freq: Every day | ORAL | Status: DC
  Filled 2016-08-19: qty 1

## 2016-08-19 MED ORDER — ASPIRIN 81 MG PO CHEW: 324.0000 mg | CHEWABLE_TABLET | Freq: Once | ORAL | Status: DC

## 2016-08-19 MED ORDER — ALPRAZOLAM 0.25 MG PO TABS: 0.2500 mg | ORAL_TABLET | Freq: Two times a day (BID) | ORAL | Status: DC | PRN

## 2016-08-19 MED ORDER — LEVOTHYROXINE SODIUM 75 MCG PO TABS
75.0000 ug | ORAL_TABLET | Freq: Every day | ORAL | Status: DC
  Administered 2016-08-20 – 2016-08-21 (×2): 75 ug via ORAL
  Filled 2016-08-19 (×2): qty 1

## 2016-08-19 MED ORDER — INSULIN ASPART 100 UNIT/ML ~~LOC~~ SOLN
0.0000 [IU] | Freq: Three times a day (TID) | SUBCUTANEOUS | Status: DC
  Administered 2016-08-21: 2 [IU] via SUBCUTANEOUS

## 2016-08-19 MED ORDER — NITROGLYCERIN 2 % TD OINT
0.5000 [in_us] | TOPICAL_OINTMENT | Freq: Three times a day (TID) | TRANSDERMAL | Status: DC
  Filled 2016-08-19: qty 30

## 2016-08-19 MED ORDER — DULOXETINE HCL 60 MG PO CPEP
60.0000 mg | ORAL_CAPSULE | Freq: Every day | ORAL | Status: DC
  Administered 2016-08-20 – 2016-08-21 (×2): 60 mg via ORAL
  Filled 2016-08-19 (×2): qty 1

## 2016-08-19 MED ORDER — ASPIRIN 81 MG PO CHEW
324.0000 mg | CHEWABLE_TABLET | Freq: Once | ORAL | Status: AC
  Administered 2016-08-19: 324 mg via ORAL

## 2016-08-19 MED ORDER — ONDANSETRON HCL 4 MG/2ML IJ SOLN: 4.0000 mg | Freq: Four times a day (QID) | INTRAMUSCULAR | Status: DC | PRN

## 2016-08-19 MED ORDER — OMEGA-3-ACID ETHYL ESTERS 1 G PO CAPS
1.0000 g | ORAL_CAPSULE | Freq: Every day | ORAL | Status: DC
  Administered 2016-08-21: 1 g via ORAL
  Filled 2016-08-19 (×2): qty 1

## 2016-08-19 MED ORDER — AMLODIPINE BESYLATE 10 MG PO TABS
10.0000 mg | ORAL_TABLET | Freq: Every day | ORAL | Status: DC
  Administered 2016-08-20 – 2016-08-21 (×2): 10 mg via ORAL
  Filled 2016-08-19: qty 1
  Filled 2016-08-19: qty 2

## 2016-08-19 MED ORDER — SOTALOL HCL 80 MG PO TABS
80.0000 mg | ORAL_TABLET | Freq: Two times a day (BID) | ORAL | Status: DC
  Administered 2016-08-19 – 2016-08-21 (×4): 80 mg via ORAL
  Filled 2016-08-19 (×4): qty 1

## 2016-08-19 MED ORDER — ACETAMINOPHEN 325 MG PO TABS: 650.0000 mg | ORAL_TABLET | ORAL | Status: DC | PRN

## 2016-08-19 MED ORDER — ASPIRIN EC 81 MG PO TBEC
81.0000 mg | DELAYED_RELEASE_TABLET | Freq: Every day | ORAL | Status: DC
  Administered 2016-08-20: 81 mg via ORAL
  Filled 2016-08-19: qty 1

## 2016-08-19 MED ORDER — ASPIRIN EC 81 MG PO TBEC: 81.0000 mg | DELAYED_RELEASE_TABLET | Freq: Every day | ORAL | 3 refills | Status: DC

## 2016-08-19 MED ORDER — IRBESARTAN 150 MG PO TABS
150.0000 mg | ORAL_TABLET | Freq: Every day | ORAL | Status: DC
  Administered 2016-08-21: 150 mg via ORAL
  Filled 2016-08-19 (×2): qty 1

## 2016-08-19 MED ORDER — NITROGLYCERIN 0.4 MG SL SUBL: 0.4000 mg | SUBLINGUAL_TABLET | SUBLINGUAL | 6 refills | Status: DC | PRN

## 2016-08-19 MED ORDER — NITROGLYCERIN 0.4 MG SL SUBL: 0.4000 mg | SUBLINGUAL_TABLET | SUBLINGUAL | Status: DC | PRN

## 2016-08-19 NOTE — Patient Instructions (Addendum)
Medication Instructions:  Your physician has recommended you make the following change in your medication:  Start Aspirin 81 mg by mouth daily.  A prescription for nitroglycerin to take as needed for chest pain has been sent to your pharmacy.      Labwork: Lab work to be done today-BMP, CBC, PT  Testing/Procedures: Your physician has requested that you have a cardiac catheterization. Cardiac catheterization is used to diagnose and/or treat various heart conditions. Doctors may recommend this procedure for a number of different reasons. The most common reason is to evaluate chest pain. Chest pain can be a symptom of coronary artery disease (CAD), and cardiac catheterization can show whether plaque is narrowing or blocking your heart's arteries. This procedure is also used to evaluate the valves, as well as measure the blood flow and oxygen levels in different parts of your heart. For further information please visit [email or web address] Please follow instruction sheet, as given.  Scheduled for 08/23/16  Follow-Up: To be arranged after catheterization.  Any Other Special Instructions Will Be Listed Below (If Applicable).  Appointments for blood work and BP check on 08/26/16 have been cancelled due to catheterization.  Will be rescheduled after procedure if needed.    If you need a refill on your cardiac medications before your next appointment, please call your pharmacy.

## 2016-08-19 NOTE — ED Triage Notes (Signed)
Pt to ED via [facility] with c/o central chest pressure x's 2 days.  Pt denies any radiation of pain.  Denies any shortness of breath, nausea or other symptoms.  Pt was sent from her MD"s office

## 2016-08-19 NOTE — ED Notes (Signed)
Admitting MD at bedside at this time.

## 2016-08-19 NOTE — H&P (Signed)
Patient ID: Mardene Celesteikki R Hippert MRN: [id number], DOB/AGE: 1950-11-25   Admit date: 08/19/2016   Primary Physician: Waldon MerlMartin, William C, PA-C Primary Cardiologist: Dr Mayford Knifeurner  HPI: 66 y.o. year-old female with history of persistent atrial fibrillation-on Xarelto, hypertension, diabetes mellitus, and obstructive sleep apnea, who was seen by Dr End 08/19/16 for follow-up of abnormal stress test. She was seen on 08/11/16 by Dr. Mayford Knifeurner, which time she complained of some mild fatigue. Given her significant family history of coronary artery disease and other cardiovascular risk factors, she was referred for myocardial perfusion stress test. This study, performed 2 days ago, demonstrated a sizable area of anterior ischemia, consistent with an intermediate risk study. The patient presented to the office discuss further evaluation and management options, including cardiac catheterization. When she saw Dr End she denied chest pain, though she endorses vague chest pressure across the upper chest for the last 2 days following stress test. The pressure is not worsened by activity. Dr End felt that given her ongoing chest pressure and abnormal stress test, [facility] EMS for further evaluation and admission. She will likely need cardiac catheterization tomorrow, 08/20/16. Aspirin 324 mg was given in the office today. Rivaroxaban should be held in anticipation of catheterization tomorrow.    Problem List: Past Medical History:  Diagnosis Date  . Anxiety   . Arthritis   . Colon polyps   . Deafness in right ear   . Depression   . Diabetes mellitus without complication (HCC)   . Diverticulosis   . Hypertension   . Migraine   . Sleep apnea   . Thyroid disease     Past Surgical History:  Procedure Laterality Date  . ABDOMINAL HYSTERECTOMY  1988  . COCHLEAR IMPLANT  2008     Allergies:  Allergies  Allergen Reactions  . Doxycycline Rash  . Erythromycin Rash  .  Lisinopril Cough  . Tetracyclines & Related Rash     Home Medications  Past Medical History:  Diagnosis Date  . Anxiety   . Arthritis   . Colon polyps   . Deafness in right ear   . Depression   . Diabetes mellitus without complication (HCC)   . Diverticulosis   . Hypertension   . Migraine   . Sleep apnea   . Thyroid disease     Prior to Admission medications   Medication Sig Start Date End Date Taking? Authorizing Provider  amLODipine (NORVASC) 10 MG tablet Take 1 tablet by mouth daily. 05/27/16   [provider]  aspirin EC 81 MG tablet Take 1 tablet (81 mg total) by mouth daily. 08/19/16   End, Cristal Deerhristopher, MD  DULoxetine (CYMBALTA) 60 MG capsule Take 1 capsule by mouth daily. 06/23/16   [provider]  glucose blood (CONTOUR NEXT TEST) test strip Check blood sugars daily 08/09/16   Waldon MerlMartin, William C, PA-C  hydrALAZINE (APRESOLINE) 50 MG tablet Take 1 tablet by mouth 2 (two) times daily. 06/23/16   [provider]  LANCETS ULTRA THIN 30G MISC 1 Stick by Does not apply route daily. 08/09/16   Waldon MerlMartin, William C, PA-C  levothyroxine (SYNTHROID, LEVOTHROID) 75 MCG tablet Take 1 tablet by mouth daily. 06/23/16   [provider]  metFORMIN (GLUCOPHAGE) 500 MG tablet Take 1 tablet by mouth 2 (two) times daily with a meal. 06/23/16   [provider]  nitroGLYCERIN (NITROSTAT) 0.4 MG SL tablet Place 1 tablet (0.4 mg total) under the  tongue every 5 (five) minutes as needed for chest pain. 08/19/16 11/17/16  End, Cristal Deer, MD  Omega-3 Fatty Acids (FISH OIL) 1000 MG CAPS Take 1 capsule by mouth daily.    [provider]  potassium chloride SA (K-DUR,KLOR-CON) 20 MEQ tablet TAKE 1 TABLET BY MOUTH TWICE DAILY 08/09/16   Waldon Merl, PA-C  simvastatin (ZOCOR) 20 MG tablet Take 1 tablet by mouth 3 (three) times a week. 05/27/16   [provider]  sotalol (BETAPACE) 80 MG tablet Take 1 tablet by mouth 2 (two) times daily. 06/23/16   [provider]  valsartan-hydrochlorothiazide (DIOVAN-HCT) 160-25 MG tablet Take 1 tablet by mouth daily. 08/11/16 08/06/17  Quintella Reichert, MD  XARELTO 20 MG TABS tablet Take 1 tablet by mouth daily. 06/23/16   [provider]     Family History  Problem Relation Age of Onset  . Hypertension Mother   . Diabetes Mother   . Heart attack Mother   . Hypertension Father   . Heart attack Father   . Stroke Father   . Cancer Sister        Breast  . Hypertension Brother   . Cancer Brother        Prostate  . Diabetes Brother   . Dementia Brother   . Stroke Brother   . Heart attack Brother   . Hypertension Son   . Cancer Maternal Aunt        Breast  . Cancer Maternal Uncle        Lung     Social History   Social History  . Marital status: Married    Spouse name: N/A  . Number of children: N/A  . Years of education: 67   Occupational History  . General Manager    Social History Main Topics  . Smoking status: Never Smoker  . Smokeless tobacco: Never Used  . Alcohol use No  . Drug use: No  . Sexual activity: Yes   Other Topics Concern  . Not on file   Social History Narrative  . No narrative on file     Review of Systems: General: negative for chills, fever, night sweats or weight changes.  Cardiovascular: negative for dyspnea on exertion, edema, orthopnea, palpitations, paroxysmal nocturnal dyspnea or shortness of breath HEENT: negative for any visual disturbances, blindness, glaucoma Dermatological: negative for rash Respiratory: negative for cough, hemoptysis, or wheezing Urologic: negative for hematuria or dysuria Abdominal: negative for nausea, vomiting, diarrhea, bright red blood per rectum, melena, or hematemesis Neurologic: negative for visual changes, syncope, or dizziness Musculoskeletal: negative for back pain, joint pain, or swelling Psych: cooperative and appropriate All other systems reviewed and are otherwise negative except as noted  above.  Physical Exam: Pulse 64, temperature 98.2 F (36.8 C), temperature source Oral, resp. rate 16, height 5\' 8"  (1.727 m), weight 215 lb (97.5 kg), SpO2 97 %.   General:  Obese woman, seated comfortably in the exam room. HEENT: No conjunctival pallor or scleral icterus. Moist mucous membranes.  OP clear. Neck: Supple without lymphadenopathy, thyromegaly, JVD, or HJR. No carotid bruit. Lungs: Normal work of breathing. Clear to auscultation bilaterally without wheezes or crackles. Heart: Regular rate and rhythm without murmurs, rubs, or gallops. Non-displaced PMI. Abd: Bowel sounds present. Soft, NT/ND without hepatosplenomegaly Ext: No lower extremity edema. Radial, PT, and DP pulses are 2+ bilaterally. Skin: Warm and dry without rash.   Labs:  No results found for this or any previous visit (from the  past 24 hour(s)).   Radiology/Studies: No results found.  EKG: Normal sinus rhythm without significant abnormalities. Previously noted ST/T changes in V2 and V3 are no longer evident  ASSESSMENT AND PLAN:   Unstable angina  She will need cardiac catheterization tomorrow. Aspirin 324 mg was given in the office today. Rivaroxaban should be held in anticipation of catheterization tomorrow.  Paroxysmal atrial fibrillation No evidence of atrial fibrillation on EKG today. The patient has not had any symptoms of A. fib either. We will hold rivaroxaban, as above. Continue sotalol.  Hypertension Blood pressure is mildly elevated with systolic pressure of 138 (goal < 130). We will defer medication changes at this time.  PLAN:  Admit to telemetry, hold Xarelto Sunday low dose nitrate, cath scheduled for Monday (schedule will not allow cath tomorrow).    Jolene Provost, PA-C 08/19/2016, 6:21 PM [phone]

## 2016-08-19 NOTE — ED Provider Notes (Signed)
[facility]-EMERGENCY DEPT Provider Note   CSN: [id number] Arrival date & time: 08/19/16  1747     History   Chief Complaint Chief Complaint  Patient presents with  . Chest Pain    HPI Ashley Woodard is a 66 y.o. female.  HPI  66 year old with past medical history significant for A. fib (Xerelto), hypertension, DM, who presents to the emergency department with a 2 day history of chest pain. States that she started having some chest tightness after she had stress testing 2 days ago. Patient scheduled to have a cardiac catheterization next week. Patient did have an abnormal stress test. Denies associated nausea, vomiting, shortness of breath, diaphoresis. Nothing improves or worsens the chest pain. No prior history of PE/DVT. No recent cough or congestion. Had an aspirin prior to arrival with no change of chest pain. Currently rates her chest pain 1/10.  Past Medical History:  Diagnosis Date  . Anxiety   . Arthritis   . Colon polyps   . Deafness in right ear   . Depression   . Diabetes mellitus without complication (HCC)   . Diverticulosis   . Hypertension   . Migraine   . Sleep apnea   . Thyroid disease     Patient Active Problem List   Diagnosis Date Noted  . Chest pain with moderate risk of acute coronary syndrome 08/19/2016  . Abnormal EKG 08/11/2016  . DDD (degenerative disc disease), lumbar 06/29/2016  . Meniere's disease 06/29/2016  . Persistent atrial fibrillation (HCC) 09/30/2014  . Diabetes mellitus, type 2 (HCC) 09/30/2014  . Hyperlipemia 09/30/2014  . Hypertension 09/30/2014  . OSA (obstructive sleep apnea) 09/30/2014    Past Surgical History:  Procedure Laterality Date  . ABDOMINAL HYSTERECTOMY  1988  . COCHLEAR IMPLANT  2008    OB History    No data available       Home Medications    Prior to Admission medications   Medication Sig Start Date End Date Taking? Authorizing Provider  amLODipine (NORVASC) 10 MG tablet Take 1 tablet by mouth daily. 05/27/16   Yes [provider]  Doxylamine Succinate, Sleep, (SLEEP AID PO) Take 1 tablet by mouth at bedtime.   Yes [provider]  DULoxetine (CYMBALTA) 60 MG capsule Take 1 capsule by mouth daily. Take a sleep aide for sleep. She does not know the name 06/23/16  Yes [provider]  glucose blood (CONTOUR NEXT TEST) test strip Check blood sugars daily 08/09/16  Yes Waldon Merl, PA-C  hydrALAZINE (APRESOLINE) 50 MG tablet Take 1 tablet by mouth 2 (two) times daily. 06/23/16  Yes [provider]  LANCETS ULTRA THIN 30G MISC 1 Stick by Does not apply route daily. 08/09/16  Yes Waldon Merl, PA-C  levothyroxine (SYNTHROID, LEVOTHROID) 75 MCG tablet Take 1 tablet by mouth daily. 06/23/16  Yes [provider]  metFORMIN (GLUCOPHAGE) 500 MG tablet Take 1 tablet by mouth 2 (two) times daily with a meal. 06/23/16  Yes [provider]  Omega-3 Fatty Acids (FISH OIL) 1000 MG CAPS Take 1 capsule by mouth daily.   Yes [provider]  potassium chloride SA (K-DUR,KLOR-CON) 20 MEQ tablet TAKE 1 TABLET BY MOUTH TWICE DAILY 08/09/16  Yes Waldon Merl, PA-C  simvastatin (ZOCOR) 20 MG tablet Take 1 tablet by mouth 3 (three) times a week. 05/27/16  Yes [provider]  sotalol (BETAPACE) 80 MG tablet Take 1 tablet by mouth 2 (two) times daily. 06/23/16  Yes [provider]  Tetrahydrozoline HCl (EYE DROPS OP) Apply 2 drops to eye as needed (Dry eyes).   Yes [provider]  valsartan-hydrochlorothiazide (DIOVAN-HCT) 160-25 MG tablet Take 1 tablet by mouth daily. 08/11/16 08/06/17 Yes Turner, Cornelious Bryant, MD  XARELTO 20 MG TABS tablet Take 1 tablet by mouth at bedtime.  06/23/16  Yes [provider]  nitroGLYCERIN (NITROSTAT) 0.4 MG SL tablet Place 1 tablet (0.4 mg total) under the tongue every 5 (five) minutes as needed for chest pain. 08/19/16 11/17/16  End, Cristal Deer, MD    Family History Family History  Problem Relation Age of  Onset  . Hypertension Mother   . Diabetes Mother   . Heart attack Mother   . Hypertension Father   . Heart attack Father   . Stroke Father   . Cancer Sister        Breast  . Hypertension Brother   . Cancer Brother        Prostate  . Diabetes Brother   . Dementia Brother   . Stroke Brother   . Heart attack Brother   . Hypertension Son   . Cancer Maternal Aunt        Breast  . Cancer Maternal Uncle        Lung    Social History Social History  Substance Use Topics  . Smoking status: Never Smoker  . Smokeless tobacco: Never Used  . Alcohol use No     Allergies   Doxycycline; Erythromycin; Lisinopril; and Tetracyclines & related   Review of Systems Review of Systems  Constitutional: Negative for appetite change and fever.  HENT: Negative for congestion.   Respiratory: Positive for chest tightness. Negative for cough and shortness of breath.   Cardiovascular: Positive for chest pain.  Gastrointestinal: Negative for abdominal pain, diarrhea and vomiting.  Genitourinary: Negative for dysuria.  Musculoskeletal: Negative for back pain.  Skin: Negative for rash.  Neurological: Negative for syncope and weakness.  Psychiatric/Behavioral: Negative for behavioral problems.     Physical Exam Updated Vital Signs BP (!) 156/65 (BP Location: Right Arm)   Pulse 64   Temp 98.7 F (37.1 C) (Oral)   Resp 18   Ht 5\' 8"  (1.727 m)   Wt 97.5 kg (215 lb)   SpO2 98%   BMI 32.69 kg/m   Physical Exam  Constitutional: She is oriented to person, place, and time. She appears well-developed and well-nourished. No distress.  HENT:  Head: Atraumatic.  Eyes: EOM are normal.  Neck: Normal range of motion. No JVD present.  Cardiovascular: Normal rate, regular rhythm, normal heart sounds and intact distal pulses.   Pulmonary/Chest: Effort normal and breath sounds normal. No respiratory distress.  Abdominal: There is no tenderness.  Musculoskeletal: Normal range of motion. She  exhibits no edema.  No unilateral leg swelling  Neurological: She is alert and oriented to person, place, and time.  Skin: Skin is warm. No pallor.  Psychiatric: She has a normal mood and affect.     ED Treatments / Results  Labs (all labs ordered are listed, but only abnormal results are displayed) Labs Reviewed  BASIC METABOLIC PANEL - Abnormal; Notable for the following:       Result Value   Glucose, Bld 107 (*)    All other components within normal limits  PROTIME-INR - Abnormal; Notable for the following:    Prothrombin Time 17.3 (*)    All other components within normal limits  CBG MONITORING, ED - Abnormal; Notable for the  following:    Glucose-Capillary 102 (*)    All other components within normal limits  CBC  TROPONIN I  HIV ANTIBODY (ROUTINE TESTING)  TROPONIN I  TROPONIN I  I-STAT TROPOININ, ED  I-STAT TROPOININ, ED    EKG  EKG Interpretation None       Radiology Dg Chest 2 View  Result Date: 08/19/2016 CLINICAL DATA:  Chest pain for 3 days. EXAM: CHEST  2 VIEW COMPARISON:  07/16/2009 and prior radiographs FINDINGS: Upper limits normal heart size again noted. There is no evidence of focal airspace disease, pulmonary edema, suspicious pulmonary nodule/mass, pleural effusion, or pneumothorax. No acute bony abnormalities are identified. IMPRESSION: No evidence of acute cardiopulmonary disease. Electronically Signed   By: Harmon PierJeffrey  Hu M.D.   On: 08/19/2016 18:59    Procedures Procedures (including critical care time)  Medications Ordered in ED Medications  aspirin EC tablet 81 mg (not administered)  nitroGLYCERIN (NITROSTAT) SL tablet 0.4 mg (not administered)  acetaminophen (TYLENOL) tablet 650 mg (not administered)  ondansetron (ZOFRAN) injection 4 mg (not administered)  nitroGLYCERIN (NITROGLYN) 2 % ointment 0.5 inch (not administered)  ALPRAZolam (XANAX) tablet 0.25 mg (not administered)  zolpidem (AMBIEN) tablet 5 mg (not administered)  amLODipine  (NORVASC) tablet 10 mg (not administered)  DULoxetine (CYMBALTA) DR capsule 60 mg (not administered)  hydrALAZINE (APRESOLINE) tablet 50 mg (not administered)  levothyroxine (SYNTHROID, LEVOTHROID) tablet 75 mcg (not administered)  omega-3 acid ethyl esters (LOVAZA) capsule 1 g (not administered)  potassium chloride SA (K-DUR,KLOR-CON) CR tablet 20 mEq (not administered)  simvastatin (ZOCOR) tablet 20 mg (not administered)  sotalol (BETAPACE) tablet 80 mg (not administered)  rivaroxaban (XARELTO) tablet 20 mg (not administered)  insulin aspart (novoLOG) injection 0-15 Units (not administered)  insulin aspart (novoLOG) injection 0-5 Units (not administered)  irbesartan (AVAPRO) tablet 150 mg (not administered)    And  hydrochlorothiazide (HYDRODIURIL) tablet 25 mg (not administered)     Initial Impression / Assessment and Plan / ED Course  I have reviewed the triage vital signs and the nursing notes.  Pertinent labs & imaging results that were available during my care of the patient were reviewed by me and considered in my medical decision making (see chart for details).  66 year old female with multiple comorbidities, who presents to the emergency department with 2 days of chest tightness. Abnormal stress test 2 days ago, scheduled for cardiac catheterization next week. Received aspirin prior to arrival.  Clinical picture concerning for ACS. Doubt PE, low risk well's criteria, no dyspnea, on anticoagulation. Initial troponin Negative. No significant electrolyte abnormalities. No leukocytosis. Cardiology consulted, evaluated the patient in the emergency department.  Pt admitted to Cardiology. Pt stable for the floor.    Final Clinical Impressions(s) / ED Diagnoses   Final diagnoses:  ACS (acute coronary syndrome) [facility](HCC)  Chest pain, unspecified type    New Prescriptions Current Discharge Medication List       Corena HerterMumma, Golden Emile, MD 08/19/16 2156    Benjiman CorePickering, Nathan,  MD 08/20/16 [phone]

## 2016-08-19 NOTE — Progress Notes (Signed)
Follow-up Outpatient Visit Date: 08/19/2016  Primary Care Provider: Waldon Merl, PA-C [address] [place] [address]  Chief Complaint: Abnormal stress test  HPI:  Ashley Woodard is a 66 y.o. year-old female with history of persistent atrial fibrillation, hypertension, diabetes mellitus, and obstructive sleep apnea, who presents for follow-up of abnormal stress test. She was seen on 08/11/16 by Dr. Mayford Knife, which time she worse some mild fatigue. Given her significant family history of coronary artery disease and other cardiovascular risk factors, she is referred for myocardial perfusion stress test. This study, performed 2 days ago, demonstrated a sizable area of anterior ischemia, consistent with an intermediate risk study. The patient presents today to discuss further evaluation and management options, including cardiac catheterization. She denies chest pain, though she endorses vague chest pressure across the upper chest for the last 2 days following stress test. The pressure is not worsened by activity. She has not taken any medication for this. Ashley Woodard has not had any shortness of breath, palpitations, lightheadedness, orthopnea, and PND. She has noted fatigue for the last several months, though she continues to exercise. She last took rivaroxaban yesterday evening. She has not noted any bleeding.  --------------------------------------------------------------------------------------------------  Cardiovascular History & Procedures: Cardiovascular Problems:  Abnormal stress test  Persistent atrial fibrillation  Risk Factors:  Diabetes, hypertension, obesity, age greater than 72, and family history  Cath/PCI:  None  CV Surgery:  None  EP Procedures and Devices:  None  Non-Invasive Evaluation(s):  Pharmacologic MPI (08/17/16): Large in size, moderate intensity, reversible anteroapical, apical, and lateral wall perfusion defect suggestive of ischemia and less likely  shifting breast attenuation. LVEF 64%. Intermediate risk study.  Recent CV Pertinent Labs: Lab Results  Component Value Date   CHOL 129 04/12/2016   HDL 37 04/12/2016   LDLCALC 49 04/12/2016   TRIG 429 (A) 04/12/2016   INR 1.04 07/16/2009   K 4.1 08/03/2016   BUN 21 08/03/2016   BUN 16 04/12/2016   CREATININE 0.91 08/03/2016    Past medical and surgical history were reviewed and updated in EPIC.  Current Meds  Medication Sig  . amLODipine (NORVASC) 10 MG tablet Take 1 tablet by mouth daily.  . DULoxetine (CYMBALTA) 60 MG capsule Take 1 capsule by mouth daily.  . hydrALAZINE (APRESOLINE) 50 MG tablet Take 1 tablet by mouth 2 (two) times daily.  Marland Kitchen levothyroxine (SYNTHROID, LEVOTHROID) 75 MCG tablet Take 1 tablet by mouth daily.  . metFORMIN (GLUCOPHAGE) 500 MG tablet Take 1 tablet by mouth 2 (two) times daily with a meal.  . Omega-3 Fatty Acids (FISH OIL) 1000 MG CAPS Take 1 capsule by mouth daily.  . potassium chloride SA (K-DUR,KLOR-CON) 20 MEQ tablet TAKE 1 TABLET BY MOUTH TWICE DAILY  . simvastatin (ZOCOR) 20 MG tablet Take 1 tablet by mouth 3 (three) times a week.  . sotalol (BETAPACE) 80 MG tablet Take 1 tablet by mouth 2 (two) times daily.  . valsartan-hydrochlorothiazide (DIOVAN-HCT) 160-25 MG tablet Take 1 tablet by mouth daily.  Carlena Hurl 20 MG TABS tablet Take 1 tablet by mouth daily.    Allergies: Doxycycline; Erythromycin; Lisinopril; and Tetracyclines & related  Social History   Social History  . Marital status: Married    Spouse name: N/A  . Number of children: N/A  . Years of education: 95   Occupational History  . General Manager    Social History Main Topics  . Smoking status: Never Smoker  . Smokeless tobacco: Never  Used  . Alcohol use No  . Drug use: No  . Sexual activity: Yes   Other Topics Concern  . Not on file   Social History Narrative  . No narrative on file    Family History  Problem Relation Age of Onset  . Hypertension Mother     . Diabetes Mother   . Heart attack Mother   . Hypertension Father   . Heart attack Father   . Stroke Father   . Cancer Sister        Breast  . Hypertension Brother   . Cancer Brother        Prostate  . Diabetes Brother   . Dementia Brother   . Stroke Brother   . Heart attack Brother   . Hypertension Son   . Cancer Maternal Aunt        Breast  . Cancer Maternal Uncle        Lung    Review of Systems: A 12-system review of systems was performed and was negative except as noted in the HPI.  --------------------------------------------------------------------------------------------------  Physical Exam: BP (!) 138/58   Pulse 64   Ht 5\' 8"  (1.727 m)   Wt 215 lb (97.5 kg)   BMI 32.69 kg/m   General:  Obese woman, seated comfortably in the exam room. HEENT: No conjunctival pallor or scleral icterus. Moist mucous membranes.  OP clear. Neck: Supple without lymphadenopathy, thyromegaly, JVD, or HJR. No carotid bruit. Lungs: Normal work of breathing. Clear to auscultation bilaterally without wheezes or crackles. Heart: Regular rate and rhythm without murmurs, rubs, or gallops. Non-displaced PMI. Abd: Bowel sounds present. Soft, NT/ND without hepatosplenomegaly Ext: No lower extremity edema. Radial, PT, and DP pulses are 2+ bilaterally. Skin: Warm and dry without rash.  EKG:  Normal sinus rhythm without significant abnormalities. Previously noted ST/T changes in V2 and V3 are no longer evident.  Lab Results  Component Value Date   WBC 8.5 04/12/2016   HGB 13.0 04/12/2016   HCT 38 04/12/2016   MCV 86.7 07/16/2009   PLT 361 04/12/2016    Lab Results  Component Value Date   NA 140 08/03/2016   K 4.1 08/03/2016   CL 105 08/03/2016   CO2 28 08/03/2016   BUN 21 08/03/2016   CREATININE 0.91 08/03/2016   GLUCOSE 139 (H) 08/03/2016   ALT 21 08/03/2016    Lab Results  Component Value Date   CHOL 129 04/12/2016   HDL 37 04/12/2016   LDLCALC 49 04/12/2016   TRIG 429  (A) 04/12/2016   --------------------------------------------------------------------------------------------------  ASSESSMENT AND PLAN: Unstable angina Though Ashley Woodard looks well and denies chest pain, she endorses vague chest pressure that began after her stress test 2 days ago. Her EKG today does not show any acute ischemic changes. However, her myocardial perfusion stress test showed a large reversible defect consistent with ischemia (intermediate-risk study). Given ongoing chest pressure and abnormal stress test, we will refer the patient to the [facility] Emergency Department via EMS for further evaluation and admission. She will likely need cardiac catheterization tomorrow. Aspirin 324 mg was given in the office today. Rivaroxaban should be held in anticipation of catheterization tomorrow.  Paroxysmal atrial fibrillation No evidence of atrial fibrillation on EKG today. The patient has not had any symptoms of A. fib either. We will hold rivaroxaban, as above. Continue sotalol.  Hypertension Blood pressure is mildly elevated with systolic pressure of 138 (goal < 130). We will defer medication changes at this  time.  Follow-up: To be determined based on hospital course.  Yvonne Kendallhristopher Nilesh Stegall, MD 08/19/2016 3:58 PM

## 2016-08-20 ENCOUNTER — Other Ambulatory Visit: Payer: Self-pay

## 2016-08-20 ENCOUNTER — Ambulatory Visit (HOSPITAL_COMMUNITY)
Admission: RE | Admit: 2016-08-20 | Payer: BLUE CROSS/BLUE SHIELD | Source: Ambulatory Visit | Admitting: Internal Medicine

## 2016-08-20 ENCOUNTER — Encounter (HOSPITAL_COMMUNITY)
Admission: EM | Disposition: A | Discharge status: Home / Self Care | Payer: Self-pay | Source: Home / Self Care | Attending: Cardiology

## 2016-08-20 DIAGNOSIS — I200 Unstable angina: Secondary | ICD-10-CM | POA: Diagnosis present

## 2016-08-20 DIAGNOSIS — R9439 Abnormal result of other cardiovascular function study: Secondary | ICD-10-CM

## 2016-08-20 HISTORY — PX: LEFT HEART CATH AND CORONARY ANGIOGRAPHY: CATH118249

## 2016-08-20 LAB — TROPONIN I
Troponin I: <0.03 ng/mL (ref <0.03)
Troponin I: <0.03 ng/mL (ref <0.03)

## 2016-08-20 LAB — HIV ANTIBODY (ROUTINE TESTING W REFLEX): HIV Screen 4th Generation wRfx: NONREACTIVE

## 2016-08-20 LAB — GLUCOSE, CAPILLARY
GLUCOSE-CAPILLARY: 138 mg/dL — AB (ref 65–99)
Glucose-Capillary: 109 mg/dL — ABNORMAL HIGH (ref 65–99)
Glucose-Capillary: 166 mg/dL — ABNORMAL HIGH (ref 65–99)

## 2016-08-20 LAB — PROTIME-INR
INR: 1.17
Prothrombin Time: 14.9 seconds (ref 11.4–15.2)

## 2016-08-20 SURGERY — LEFT HEART CATH AND CORONARY ANGIOGRAPHY
Anesthesia: LOCAL

## 2016-08-20 MED ORDER — MIDAZOLAM HCL 2 MG/2ML IJ SOLN
INTRAMUSCULAR | Status: AC
Start: 2016-08-20 — End: 2016-08-20
  Filled 2016-08-20: qty 2

## 2016-08-20 MED ORDER — SODIUM CHLORIDE 0.9 % WEIGHT BASED INFUSION
3.0000 mL/kg/h | INTRAVENOUS | Status: AC
  Administered 2016-08-20: 3 mL/kg/h via INTRAVENOUS

## 2016-08-20 MED ORDER — SODIUM CHLORIDE 0.9% FLUSH: 3.0000 mL | Freq: Two times a day (BID) | INTRAVENOUS | Status: DC

## 2016-08-20 MED ORDER — LIDOCAINE HCL 1 % IJ SOLN
INTRAMUSCULAR | Status: AC
  Filled 2016-08-20: qty 20

## 2016-08-20 MED ORDER — VERAPAMIL HCL 2.5 MG/ML IV SOLN
INTRAVENOUS | Status: DC | PRN
  Administered 2016-08-20: 10 mL via INTRA_ARTERIAL

## 2016-08-20 MED ORDER — IOPAMIDOL (ISOVUE-370) INJECTION 76%
INTRAVENOUS | Status: DC | PRN
  Administered 2016-08-20: 55 mL via INTRAVENOUS

## 2016-08-20 MED ORDER — MIDAZOLAM HCL 2 MG/2ML IJ SOLN
INTRAMUSCULAR | Status: DC | PRN
  Administered 2016-08-20: 1 mg via INTRAVENOUS

## 2016-08-20 MED ORDER — HEPARIN SODIUM (PORCINE) 1000 UNIT/ML IJ SOLN
INTRAMUSCULAR | Status: AC
  Filled 2016-08-20: qty 1

## 2016-08-20 MED ORDER — SODIUM CHLORIDE 0.9 % IV SOLN: 250.0000 mL | INTRAVENOUS | Status: DC | PRN

## 2016-08-20 MED ORDER — ASPIRIN 81 MG PO CHEW: 81.0000 mg | CHEWABLE_TABLET | ORAL | Status: DC

## 2016-08-20 MED ORDER — SODIUM CHLORIDE 0.9 % WEIGHT BASED INFUSION: 3.0000 mL/kg/h | INTRAVENOUS | Status: DC

## 2016-08-20 MED ORDER — SODIUM CHLORIDE 0.9 % WEIGHT BASED INFUSION: 1.0000 mL/kg/h | INTRAVENOUS | Status: DC

## 2016-08-20 MED ORDER — FENTANYL CITRATE (PF) 100 MCG/2ML IJ SOLN
INTRAMUSCULAR | Status: DC | PRN
  Administered 2016-08-20: 50 ug via INTRAVENOUS

## 2016-08-20 MED ORDER — SODIUM CHLORIDE 0.9% FLUSH: 3.0000 mL | INTRAVENOUS | Status: DC | PRN

## 2016-08-20 MED ORDER — SODIUM CHLORIDE 0.9% FLUSH
3.0000 mL | Freq: Two times a day (BID) | INTRAVENOUS | Status: DC
  Administered 2016-08-20: 3 mL via INTRAVENOUS

## 2016-08-20 MED ORDER — RIVAROXABAN 20 MG PO TABS
20.0000 mg | ORAL_TABLET | Freq: Every day | ORAL | Status: AC
  Administered 2016-08-21: 20 mg via ORAL
  Filled 2016-08-20: qty 1

## 2016-08-20 MED ORDER — HEPARIN (PORCINE) IN NACL 2-0.9 UNIT/ML-% IJ SOLN
INTRAMUSCULAR | Status: AC | PRN
  Administered 2016-08-20: 1000 mL

## 2016-08-20 MED ORDER — SODIUM CHLORIDE 0.9 % IV SOLN: INTRAVENOUS | Status: AC

## 2016-08-20 MED ORDER — HEPARIN (PORCINE) IN NACL 2-0.9 UNIT/ML-% IJ SOLN
INTRAMUSCULAR | Status: AC
  Filled 2016-08-20: qty 1000

## 2016-08-20 MED ORDER — LIDOCAINE HCL (PF) 1 % IJ SOLN
INTRAMUSCULAR | Status: DC | PRN
  Administered 2016-08-20: 2 mL via SUBCUTANEOUS

## 2016-08-20 MED ORDER — HEPARIN SODIUM (PORCINE) 1000 UNIT/ML IJ SOLN
INTRAMUSCULAR | Status: DC | PRN
  Administered 2016-08-20: 5000 [IU] via INTRAVENOUS

## 2016-08-20 MED ORDER — FENTANYL CITRATE (PF) 100 MCG/2ML IJ SOLN
INTRAMUSCULAR | Status: AC
  Filled 2016-08-20: qty 2

## 2016-08-20 MED ORDER — ISOSORBIDE MONONITRATE ER 30 MG PO TB24
15.0000 mg | ORAL_TABLET | Freq: Every day | ORAL | Status: DC
  Administered 2016-08-20 – 2016-08-21 (×2): 15 mg via ORAL
  Filled 2016-08-20 (×2): qty 1

## 2016-08-20 MED ORDER — VERAPAMIL HCL 2.5 MG/ML IV SOLN
INTRAVENOUS | Status: AC
  Filled 2016-08-20: qty 2

## 2016-08-20 SURGICAL SUPPLY — 10 items

## 2016-08-20 NOTE — Interval H&P Note (Signed)
History and Physical Interval Note:  08/20/2016 5:36 PM  Ashley Woodard  has presented today for cardiac catheterization, with the diagnosis of chest pain and abnormal stress test. The various methods of treatment have been discussed with the patient and family. After consideration of risks, benefits and other options for treatment, the patient has consented to  Procedure(s): Left Heart Cath and Coronary Angiography (N/A) as a surgical intervention .  The patient's history has been reviewed, patient examined, no change in status, stable for surgery.  I have reviewed the patient's chart and labs.  Questions were answered to the patient's satisfaction.    Cath Lab Visit (complete for each Cath Lab visit)  Clinical Evaluation Leading to the Procedure:   ACS: Yes.    Non-ACS:    Anginal Classification: CCS IV  Anti-ischemic medical therapy: Maximal Therapy (2 or more classes of medications)  Non-Invasive Test Results: Intermediate-risk stress test findings: cardiac mortality 1-3%/year  Prior CABG: No previous CABG  Dondre Catalfamo

## 2016-08-20 NOTE — H&P (View-Only) (Signed)
Progress Note  Patient Name: Ashley Woodard Date of Encounter: 08/20/2016  Primary Cardiologist: Dr. Mayford Woodard   Subjective   Currently CP free. No dyspnea. No complaints currently.   Inpatient Medications    Scheduled Meds: . amLODipine  10 mg Oral Daily  . aspirin EC  81 mg Oral Daily  . DULoxetine  60 mg Oral Daily  . hydrALAZINE  50 mg Oral BID  . irbesartan  150 mg Oral Daily   And  . hydrochlorothiazide  25 mg Oral Daily  . insulin aspart  0-15 Units Subcutaneous TID WC  . insulin aspart  0-5 Units Subcutaneous QHS  . levothyroxine  75 mcg Oral QAC breakfast  . nitroGLYCERIN  0.5 inch Topical Q8H  . omega-3 acid ethyl esters  1 g Oral Daily  . potassium chloride SA  20 mEq Oral BID  . rivaroxaban  20 mg Oral Daily  . simvastatin  20 mg Oral Q M,W,F-1800  . sotalol  80 mg Oral BID   Continuous Infusions:  PRN Meds: acetaminophen, ALPRAZolam, nitroGLYCERIN, ondansetron (ZOFRAN) IV, zolpidem   Vital Signs    Vitals:   08/19/16 1915 08/19/16 1923 08/19/16 2011 08/20/16 0500  BP: (!) 120/110 140/74 (!) 156/65 139/61  Pulse: 63 64  (!) 58  Resp: 18 16 18 16   Temp:   98.7 F (37.1 C) 97.7 F (36.5 C)  TempSrc:   Oral Oral  SpO2: 96% 99% 98% 97%  Weight:    210 lb 11.2 oz (95.6 kg)  Height:        Intake/Output Summary (Last 24 hours) at 08/20/16 0924 Last data filed at 08/19/16 2200  Gross per 24 hour  Intake                0 ml  Output              800 ml  Net             -800 ml   Filed Weights   08/19/16 1819 08/20/16 0500  Weight: 215 lb (97.5 kg) 210 lb 11.2 oz (95.6 kg)    Telemetry    NSR- Personally Reviewed  ECG    Sinus bradycardia - Personally Reviewed  Physical Exam   GEN: No acute distress, moderately obese.   Neck: No JVD Cardiac: RRR, no murmurs, rubs, or gallops.  Respiratory: Clear to auscultation bilaterally. GI: Soft, nontender, non-distended  MS: No edema; No deformity. Neuro:  Nonfocal  Psych: Normal affect   Labs    Chemistry Recent Labs Lab 08/19/16 1818  NA 139  K 3.6  CL 105  CO2 25  GLUCOSE 107*  BUN 15  CREATININE 0.92  CALCIUM 9.9  GFRNONAA >60  GFRAA >60  ANIONGAP 9     Hematology Recent Labs Lab 08/19/16 1818  WBC 9.0  RBC 4.79  HGB 13.3  HCT 40.9  MCV 85.4  MCH 27.8  MCHC 32.5  RDW 15.2  PLT 350    Cardiac Enzymes Recent Labs Lab 08/19/16 1939 08/20/16 0104 08/20/16 0747  TROPONINI <0.03 <0.03 <0.03    Recent Labs Lab 08/19/16 1824  TROPIPOC 0.00     BNPNo results for input(s): BNP, PROBNP in the last 168 hours.   DDimer No results for input(s): DDIMER in the last 168 hours.   Radiology    Dg Chest 2 View  Result Date: 08/19/2016 CLINICAL DATA:  Chest pain for 3 days. EXAM: CHEST  2 VIEW COMPARISON:  07/16/2009 and  prior radiographs FINDINGS: Upper limits normal heart size again noted. There is no evidence of focal airspace disease, pulmonary edema, suspicious pulmonary nodule/mass, pleural effusion, or pneumothorax. No acute bony abnormalities are identified. IMPRESSION: No evidence of acute cardiopulmonary disease. Electronically Signed   By: Harmon Pier M.D.   On: 08/19/2016 18:59    Cardiac Studies   NST 08/17/16 Study Highlights     Nuclear stress EF: 64%.  T wave inversion was noted during stress in the V4, V5 and V6 leads.  There was no ST segment deviation noted during stress.  Defect 1: There is a large defect of moderate severity.  Findings consistent with ischemia.  This is an intermediate risk study.   Large size, moderate intensity reversible anteroapical, apical and lateral wall perfusion defect (SDS 8) suggestive of ischemia or less likely shifting breast artifact. LVEF 64% with normal wall motion. This is an intermediate risk study. Clinical correlation is advised.      Patient Profile     66 y.o.year-old femalewith a history of atrial fibrillation-on Xarelto, hypertension, diabetes mellitus, obstructive sleep apnea  and recent intermediate risk NST admitted for unstable angina.   Assessment & Plan    1. Unstable Angina: she has ruled out for MI with negative cardiac enzymes x 3. Recent  NST 08/17/16 showed Large size, moderate intensity reversible anteroapical, apical and lateral wall perfusion defect (SDS 8) suggestive of ischemia. Given she has ruled out for MI, plan is to peform LHC on Monday. If recurrent CP over the weekend, consider IV nitro. Will need to hold Sunday night's dose of Xarelto for cath. Continue ASA and statin. LDL is controlled at 49. No room for BB given bradycardia. She is currently CP free. She is ok with staying for observation over the weekend until cath.   2. PAF: NSR currently on telemetry. On Xarelto for a/c. Will need to hold Sunday Night's dose for cath.   3. DM: metformin is on hold for anticipated cath. Cover with SSI.   4. HLD: LDL controlled with statin.    Signed, Ashley Lis, PA-C  08/20/2016, 9:24 AM    History and all data above reviewed.  Patient examined.  I agree with the findings as above.  She had chest pain last night but none since then.  The patient exam reveals COR:RRR  ,  Lungs: Clear  ,  Abd: Positive bowel sounds, no rebound no guarding, Ext No edema  .  All available labs, radiology testing, previous records reviewed. Agree with documented assessment and plan. Cath later today if possible.  Monday likely as the schedule is full.    Ashley Woodard  10:25 AM  08/20/2016  \

## 2016-08-20 NOTE — Progress Notes (Signed)
Progress Note  Patient Name: Ashley Woodard Date of Encounter: 08/20/2016  Primary Cardiologist: Dr. Mayford Knifeurner   Subjective   Currently CP free. No dyspnea. No complaints currently.   Inpatient Medications    Scheduled Meds: . amLODipine  10 mg Oral Daily  . aspirin EC  81 mg Oral Daily  . DULoxetine  60 mg Oral Daily  . hydrALAZINE  50 mg Oral BID  . irbesartan  150 mg Oral Daily   And  . hydrochlorothiazide  25 mg Oral Daily  . insulin aspart  0-15 Units Subcutaneous TID WC  . insulin aspart  0-5 Units Subcutaneous QHS  . levothyroxine  75 mcg Oral QAC breakfast  . nitroGLYCERIN  0.5 inch Topical Q8H  . omega-3 acid ethyl esters  1 g Oral Daily  . potassium chloride SA  20 mEq Oral BID  . rivaroxaban  20 mg Oral Daily  . simvastatin  20 mg Oral Q M,W,F-1800  . sotalol  80 mg Oral BID   Continuous Infusions:  PRN Meds: acetaminophen, ALPRAZolam, nitroGLYCERIN, ondansetron (ZOFRAN) IV, zolpidem   Vital Signs    Vitals:   08/19/16 1915 08/19/16 1923 08/19/16 2011 08/20/16 0500  BP: (!) 120/110 140/74 (!) 156/65 139/61  Pulse: 63 64  (!) 58  Resp: 18 16 18 16   Temp:   98.7 F (37.1 C) 97.7 F (36.5 C)  TempSrc:   Oral Oral  SpO2: 96% 99% 98% 97%  Weight:    210 lb 11.2 oz (95.6 kg)  Height:        Intake/Output Summary (Last 24 hours) at 08/20/16 0924 Last data filed at 08/19/16 2200  Gross per 24 hour  Intake                0 ml  Output              800 ml  Net             -800 ml   Filed Weights   08/19/16 1819 08/20/16 0500  Weight: 215 lb (97.5 kg) 210 lb 11.2 oz (95.6 kg)    Telemetry    NSR- Personally Reviewed  ECG    Sinus bradycardia - Personally Reviewed  Physical Exam   GEN: No acute distress, moderately obese.   Neck: No JVD Cardiac: RRR, no murmurs, rubs, or gallops.  Respiratory: Clear to auscultation bilaterally. GI: Soft, nontender, non-distended  MS: No edema; No deformity. Neuro:  Nonfocal  Psych: Normal affect   Labs    Chemistry Recent Labs Lab 08/19/16 1818  NA 139  K 3.6  CL 105  CO2 25  GLUCOSE 107*  BUN 15  CREATININE 0.92  CALCIUM 9.9  GFRNONAA >60  GFRAA >60  ANIONGAP 9     Hematology Recent Labs Lab 08/19/16 1818  WBC 9.0  RBC 4.79  HGB 13.3  HCT 40.9  MCV 85.4  MCH 27.8  MCHC 32.5  RDW 15.2  PLT 350    Cardiac Enzymes Recent Labs Lab 08/19/16 1939 08/20/16 0104 08/20/16 0747  TROPONINI <0.03 <0.03 <0.03    Recent Labs Lab 08/19/16 1824  TROPIPOC 0.00     BNPNo results for input(s): BNP, PROBNP in the last 168 hours.   DDimer No results for input(s): DDIMER in the last 168 hours.   Radiology    Dg Chest 2 View  Result Date: 08/19/2016 CLINICAL DATA:  Chest pain for 3 days. EXAM: CHEST  2 VIEW COMPARISON:  07/16/2009 and  prior radiographs FINDINGS: Upper limits normal heart size again noted. There is no evidence of focal airspace disease, pulmonary edema, suspicious pulmonary nodule/mass, pleural effusion, or pneumothorax. No acute bony abnormalities are identified. IMPRESSION: No evidence of acute cardiopulmonary disease. Electronically Signed   By: Harmon Pier M.D.   On: 08/19/2016 18:59    Cardiac Studies   NST 08/17/16 Study Highlights     Nuclear stress EF: 64%.  T wave inversion was noted during stress in the V4, V5 and V6 leads.  There was no ST segment deviation noted during stress.  Defect 1: There is a large defect of moderate severity.  Findings consistent with ischemia.  This is an intermediate risk study.   Large size, moderate intensity reversible anteroapical, apical and lateral wall perfusion defect (SDS 8) suggestive of ischemia or less likely shifting breast artifact. LVEF 64% with normal wall motion. This is an intermediate risk study. Clinical correlation is advised.      Patient Profile     66 y.o.year-old femalewith a history of atrial fibrillation-on Xarelto, hypertension, diabetes mellitus, obstructive sleep apnea  and recent intermediate risk NST admitted for unstable angina.   Assessment & Plan    1. Unstable Angina: she has ruled out for MI with negative cardiac enzymes x 3. Recent  NST 08/17/16 showed Large size, moderate intensity reversible anteroapical, apical and lateral wall perfusion defect (SDS 8) suggestive of ischemia. Given she has ruled out for MI, plan is to peform LHC on Monday. If recurrent CP over the weekend, consider IV nitro. Will need to hold Sunday night's dose of Xarelto for cath. Continue ASA and statin. LDL is controlled at 49. No room for BB given bradycardia. She is currently CP free. She is ok with staying for observation over the weekend until cath.   2. PAF: NSR currently on telemetry. On Xarelto for a/c. Will need to hold Sunday Night's dose for cath.   3. DM: metformin is on hold for anticipated cath. Cover with SSI.   4. HLD: LDL controlled with statin.    Signed, Robbie Lis, PA-C  08/20/2016, 9:24 AM    History and all data above reviewed.  Patient examined.  I agree with the findings as above.  She had chest pain last night but none since then.  The patient exam reveals COR:RRR  ,  Lungs: Clear  ,  Abd: Positive bowel sounds, no rebound no guarding, Ext No edema  .  All available labs, radiology testing, previous records reviewed. Agree with documented assessment and plan. Cath later today if possible.  Monday likely as the schedule is full.    [name]  10:25 AM  08/20/2016  \

## 2016-08-20 NOTE — Care Management Note (Signed)
Case Management Note  Patient Details  Name: Ashley Woodard MRN: [id number] Date of Birth: 12/18/1950  Subjective/Objective:  Pt presented for Chest Pain. Cath possible later today if can work in the schedule. PTA- Independent.                    Action/Plan: No home needs identified.   Expected Discharge Date:                  Expected Discharge Plan:  Home/Self Care  In-House Referral:  NA  Discharge planning Services  CM Consult  Post Acute Care Choice:  NA Choice offered to:  NA  DME Arranged:  N/A DME Agency:  NA  HH Arranged:  NA HH Agency:  NA  Status of Service:  Completed, signed off  If discussed at Long Length of Stay Meetings, dates discussed:    Additional Comments:  Gala LewandowskyGraves-Bigelow, Ripken Rekowski Kaye, RN 08/20/2016, 4:48 PM

## 2016-08-20 NOTE — Plan of Care (Signed)
Problem: Physical Regulation: Goal: Ability to maintain clinical measurements within normal limits will improve Outcome: Progressing Pt denies any CP throughout shift. VSS. Resting comfortable. Cardiac rhythm NSR/SB. Continue to monitor.

## 2016-08-20 NOTE — Discharge Instructions (Addendum)
Information on my medicine - XARELTO (Rivaroxaban)  This medication education was reviewed with me or my healthcare representative as part of my discharge preparation.  The pharmacist that spoke with me during my hospital stay was:  Allena Katzaroline E Welles, [facility]  Why was Xarelto prescribed for you? Xarelto was prescribed for you to reduce the risk of a blood clot forming that can cause a stroke if you have a medical condition called atrial fibrillation (a type of irregular heartbeat).  What do you need to know about xarelto ? Take your Xarelto ONCE DAILY at the same time every day with your evening meal. If you have difficulty swallowing the tablet whole, you may crush it and mix in applesauce just prior to taking your dose.  Take Xarelto exactly as prescribed by your doctor and DO NOT stop taking Xarelto without talking to the doctor who prescribed the medication.  Stopping without other stroke prevention medication to take the place of Xarelto may increase your risk of developing a clot that causes a stroke.  Refill your prescription before you run out.  After discharge, you should have regular check-up appointments with your healthcare provider that is prescribing your Xarelto.  In the future your dose may need to be changed if your kidney function or weight changes by a significant amount.  What do you do if you miss a dose? If you are taking Xarelto ONCE DAILY and you miss a dose, take it as soon as you remember on the same day then continue your regularly scheduled once daily regimen the next day. Do not take two doses of Xarelto at the same time or on the same day.   Important Safety Information A possible side effect of Xarelto is bleeding. You should call your healthcare provider right away if you experience any of the following: ? Bleeding from an injury or your nose that does not stop. ? Unusual colored urine (red or dark brown) or unusual colored stools (red or  black). ? Unusual bruising for unknown reasons. ? A serious fall or if you hit your head (even if there is no bleeding).  Some medicines may interact with Xarelto and might increase your risk of bleeding while on Xarelto. To help avoid this, consult your healthcare provider or pharmacist prior to using any new prescription or non-prescription medications, including herbals, vitamins, non-steroidal anti-inflammatory drugs (NSAIDs) and supplements.  This website has more information on Xarelto: [email or web address].xarelto.[email or web address].  [facility] [address] [facility] at [phone] if any bleeding, swelling or drainage at cath site.  May shower, no tub baths for 48 hours for groin sticks. No lifting over 5 pounds for 3 days.  No Driving for 3 days  Heart Healthy Diabetic Diet No Metformin until Monday 08/23/16 watch diet closely.  We added Imdur to meds.

## 2016-08-21 ENCOUNTER — Encounter (HOSPITAL_COMMUNITY): Payer: Self-pay | Admitting: Cardiology

## 2016-08-21 DIAGNOSIS — E785 Hyperlipidemia, unspecified: Secondary | ICD-10-CM

## 2016-08-21 DIAGNOSIS — I10 Essential (primary) hypertension: Secondary | ICD-10-CM

## 2016-08-21 DIAGNOSIS — G4733 Obstructive sleep apnea (adult) (pediatric): Secondary | ICD-10-CM

## 2016-08-21 DIAGNOSIS — Z9889 Other specified postprocedural states: Secondary | ICD-10-CM

## 2016-08-21 DIAGNOSIS — R9439 Abnormal result of other cardiovascular function study: Secondary | ICD-10-CM

## 2016-08-21 DIAGNOSIS — E119 Type 2 diabetes mellitus without complications: Secondary | ICD-10-CM

## 2016-08-21 DIAGNOSIS — I481 Persistent atrial fibrillation: Secondary | ICD-10-CM

## 2016-08-21 DIAGNOSIS — R9431 Abnormal electrocardiogram [ECG] [EKG]: Secondary | ICD-10-CM

## 2016-08-21 DIAGNOSIS — R079 Chest pain, unspecified: Principal | ICD-10-CM

## 2016-08-21 HISTORY — DX: Other specified postprocedural states: Z98.890

## 2016-08-21 HISTORY — DX: Abnormal result of other cardiovascular function study: R94.39

## 2016-08-21 LAB — GLUCOSE, CAPILLARY: Glucose-Capillary: 128 mg/dL — ABNORMAL HIGH (ref 65–99)

## 2016-08-21 MED ORDER — METFORMIN HCL 500 MG PO TABS: 500.0000 mg | ORAL_TABLET | Freq: Two times a day (BID) | ORAL | 5 refills | Status: DC

## 2016-08-21 MED ORDER — ISOSORBIDE MONONITRATE ER 30 MG PO TB24: 15.0000 mg | ORAL_TABLET | Freq: Every day | ORAL | 6 refills | Status: DC

## 2016-08-21 NOTE — Progress Notes (Signed)
Progress Note  Patient Name: Ashley Woodard Date of Encounter: 08/21/2016  Primary Cardiologist: Dr. Mayford Knifeurner  Subjective   No chest pain and no SOB  Inpatient Medications    Scheduled Meds: . amLODipine  10 mg Oral Daily  . DULoxetine  60 mg Oral Daily  . hydrALAZINE  50 mg Oral BID  . irbesartan  150 mg Oral Daily   And  . hydrochlorothiazide  25 mg Oral Daily  . insulin aspart  0-15 Units Subcutaneous TID WC  . insulin aspart  0-5 Units Subcutaneous QHS  . isosorbide mononitrate  15 mg Oral Daily  . levothyroxine  75 mcg Oral QAC breakfast  . omega-3 acid ethyl esters  1 g Oral Daily  . potassium chloride SA  20 mEq Oral BID  . simvastatin  20 mg Oral Q M,W,F-1800  . sodium chloride flush  3 mL Intravenous Q12H  . sotalol  80 mg Oral BID   Continuous Infusions: . sodium chloride     PRN Meds: sodium chloride, acetaminophen, ALPRAZolam, nitroGLYCERIN, ondansetron (ZOFRAN) IV, sodium chloride flush, zolpidem   Vital Signs    Vitals:   08/20/16 1810 08/20/16 1845 08/20/16 2012 08/21/16 0600  BP: 135/70 132/68 (!) 141/63 135/68  Pulse: 64 (!) 57    Resp: 20   16  Temp:      TempSrc:      SpO2: 100% 92%  94%  Weight:    209 lb 11.2 oz (95.1 kg)  Height:        Intake/Output Summary (Last 24 hours) at 08/21/16 1033 Last data filed at 08/21/16 0900  Gross per 24 hour  Intake           1117.5 ml  Output             2300 ml  Net          -1182.5 ml   Filed Weights   08/19/16 1819 08/20/16 0500 08/21/16 0600  Weight: 215 lb (97.5 kg) 210 lb 11.2 oz (95.6 kg) 209 lb 11.2 oz (95.1 kg)    Telemetry    SR to SB - Personally Reviewed  ECG    SB with TWI in ant leads. - Personally Reviewed  Physical Exam   GEN: No acute distress.   Neck: No JVD Cardiac: RRR, no murmurs, rubs, or gallops. Rt radial cath site with 2+ pulses mild bruising no hematoma.  Respiratory: Clear to auscultation bilaterally. GI: Soft, nontender, non-distended  MS: No edema; No  deformity. Neuro:  Nonfocal  Psych: Normal affect   Labs    Chemistry Recent Labs Lab 08/19/16 1818  NA 139  K 3.6  CL 105  CO2 25  GLUCOSE 107*  BUN 15  CREATININE 0.92  CALCIUM 9.9  GFRNONAA >60  GFRAA >60  ANIONGAP 9     Hematology Recent Labs Lab 08/19/16 1818  WBC 9.0  RBC 4.79  HGB 13.3  HCT 40.9  MCV 85.4  MCH 27.8  MCHC 32.5  RDW 15.2  PLT 350    Cardiac Enzymes Recent Labs Lab 08/19/16 1939 08/20/16 0104 08/20/16 0747  TROPONINI <0.03 <0.03 <0.03    Recent Labs Lab 08/19/16 1824  TROPIPOC 0.00     BNPNo results for input(s): BNP, PROBNP in the last 168 hours.   DDimer No results for input(s): DDIMER in the last 168 hours.   Radiology    Dg Chest 2 View  Result Date: 08/19/2016 CLINICAL DATA:  Chest pain for 3  days. EXAM: CHEST  2 VIEW COMPARISON:  07/16/2009 and prior radiographs FINDINGS: Upper limits normal heart size again noted. There is no evidence of focal airspace disease, pulmonary edema, suspicious pulmonary nodule/mass, pleural effusion, or pneumothorax. No acute bony abnormalities are identified. IMPRESSION: No evidence of acute cardiopulmonary disease. Electronically Signed   By: Harmon Pier M.D.   On: 08/19/2016 18:59    Cardiac Studies   08/20/16 Procedures   Left Heart Cath and Coronary Angiography  Conclusion   Conclusions: 1. No angiographically significant coronary artery disease. 2. Normal left ventricular contraction. 3. Mildly elevated left ventricular filling pressure.  Recommendations: 1. Primary prevention of coronary artery disease. 2. Add low dose isosorbide mononitrate for possible component of microvascular dysfunction. 3. Restart rivaroxaban tomorrow evening if no evidence of bleeding right radial arteriotomy site. No need to continue aspirin in the setting of chronic anticoagulation. 4. Anticipate discharge tomorrow if no further chest pain. Follow-up as an outpatient with Dr. Mayford Knife.       Patient Profile     66 y.o. female with a history of atrial fibrillation-on Xarelto, hypertension, diabetes mellitus, obstructive sleep apnea and recent intermediate risk NST admitted for unstable angina.   Assessment & Plan    Unstable angina, neg MI, and recent NST with Large size, moderate intensity reversible anteroapical, apical and lateral wall perfusion defect (SDS 8) suggestive of ischemia.  Xarelto held and pt with cath yesterday --no angiographically sig. CAD.  Imdur added for microvascular dysfunction.   PAF NSR on tele- Xarelto resumed this AM   DM  Hold metformin 48 hours post cath.    HLD continue statin.  Sinus brady with HR to 48 at night in 50s this AM  Ambulate and d/c home.     Signed, Nada Boozer, NP  08/21/2016, 10:33 AM

## 2016-08-21 NOTE — Discharge Summary (Signed)
Discharge Summary    Patient ID: YEIMI DEBNAM,  MRN: [id number], DOB/AGE: 1950/05/09 66 y.o.  Admit date: 08/19/2016 Discharge date: 08/21/2016  Primary Care Provider: Waldon Merl Primary Cardiologist: Dr. Mayford Knife  Discharge Diagnoses    Principal Problem:   Chest pain with moderate risk of acute coronary syndrome Active Problems:   S/P cardiac catheterization, 08/20/16 minimal CAD    Abnormal cardiovascular stress test   Persistent atrial fibrillation (HCC)   Diabetes mellitus, type 2 (HCC)   Hyperlipemia   Hypertension   OSA (obstructive sleep apnea)   Abnormal EKG   Unstable angina (HCC)   Allergies Allergies  Allergen Reactions  . Doxycycline Rash  . Erythromycin Rash  . Lisinopril Cough  . Tetracyclines & Related Rash    Diagnostic Studies/Procedures    08/20/16  Procedures   Left Heart Cath and Coronary Angiography  Conclusion   Conclusions: 1. No angiographically significant coronary artery disease. 2. Normal left ventricular contraction. 3. Mildly elevated left ventricular filling pressure.  Recommendations: 1. Primary prevention of coronary artery disease. 2. Add low dose isosorbide mononitrate for possible component of microvascular dysfunction. 3. Restart rivaroxaban tomorrow evening if no evidence of bleeding right radial arteriotomy site. No need to continue aspirin in the setting of chronic anticoagulation. 4. Anticipate discharge tomorrow if no further chest pain. Follow-up as an outpatient with Dr. Mayford Knife.    08/17/16 Study Highlights     Nuclear stress EF: 64%.  T wave inversion was noted during stress in the V4, V5 and V6 leads.  There was no ST segment deviation noted during stress.  Defect 1: There is a large defect of moderate severity.  Findings consistent with ischemia.  This is an intermediate risk study.   Large size, moderate intensity reversible anteroapical, apical and lateral wall perfusion defect (SDS 8)  suggestive of ischemia or less likely shifting breast artifact. LVEF 64% with normal wall motion. This is an intermediate risk study. Clinical correlation is advised.     _____________   History of Present Illness      66 y.o.year-old femalewith history of persistent atrial fibrillation-on Xarelto-maintaining SR to SB, hypertension, diabetes mellitus, and obstructive sleep apnea without CPAP, who was seen by Dr End 08/19/16 for follow-up of abnormal stress test. She was seen on 08/11/16 by Dr. Mayford Knife, which time she complained of some mild fatigue. Given her significant family history of coronary artery disease and other cardiovascular risk factors, she was referred for myocardial perfusion stress test. This study, performed on the 26th, demonstrated a sizable area of anterior ischemia, consistent with an intermediate risk study. The patient presented to the office discuss further evaluation and management options, including cardiac catheterization. When she saw Dr End she denied chest pain, though she did have vague chest pressure across the upper chest for the last 2 days prior to admit.  The pressure is not worsened by activity. Dr End felt that given her ongoing chest pressure and abnormal stress test, [facility] for further evaluation and admission. Further eval with cardiac cath.  Aspirin 324 mg was given in the office today. Rivaroxaban was held in anticipation of catheterization.   Hospital Course     Consultants: none   Pt's troponins were neg.  hgb A1C 7.1  _____________  Discharge Vitals Blood pressure 135/68, pulse (!) 57, temperature 98.2 F (36.8 C), temperature source Oral, resp. rate 16, height 5\' 8"  (1.727 m), weight 209  lb 11.2 oz (95.1 kg), SpO2 94 %.  Filed Weights   08/19/16 1819 08/20/16 0500 08/21/16 0600  Weight: 215 lb (97.5 kg) 210 lb 11.2 oz (95.6 kg) 209 lb 11.2 oz (95.1 kg)    Labs & Radiologic Studies     CBC  Recent Labs  08/19/16 1818  WBC 9.0  HGB 13.3  HCT 40.9  MCV 85.4  PLT 350   Basic Metabolic Panel  Recent Labs  08/19/16 1818  NA 139  K 3.6  CL 105  CO2 25  GLUCOSE 107*  BUN 15  CREATININE 0.92  CALCIUM 9.9   Liver Function Tests No results for input(s): AST, ALT, ALKPHOS, BILITOT, PROT, ALBUMIN in the last 72 hours. No results for input(s): LIPASE, AMYLASE in the last 72 hours. Cardiac Enzymes  Recent Labs  08/19/16 1939 08/20/16 0104 08/20/16 0747  TROPONINI <0.03 <0.03 <0.03   BNP Invalid input(s): POCBNP D-Dimer No results for input(s): DDIMER in the last 72 hours. Hemoglobin A1C No results for input(s): HGBA1C in the last 72 hours. Fasting Lipid Panel No results for input(s): CHOL, HDL, LDLCALC, TRIG, CHOLHDL, LDLDIRECT in the last 72 hours. Thyroid Function Tests No results for input(s): TSH, T4TOTAL, T3FREE, THYROIDAB in the last 72 hours.  Invalid input(s): FREET3 _____________  Dg Chest 2 View  Result Date: 08/19/2016 CLINICAL DATA:  Chest pain for 3 days. EXAM: CHEST  2 VIEW COMPARISON:  07/16/2009 and prior radiographs FINDINGS: Upper limits normal heart size again noted. There is no evidence of focal airspace disease, pulmonary edema, suspicious pulmonary nodule/mass, pleural effusion, or pneumothorax. No acute bony abnormalities are identified. IMPRESSION: No evidence of acute cardiopulmonary disease. Electronically Signed   By: Harmon PierJeffrey  Hu M.D.   On: 08/19/2016 18:59   Disposition   Pt is being discharged home today in good condition.  Follow-up Plans & Appointments   [facility] [address] [facility] at [phone] if any bleeding, swelling or drainage at cath site.  May shower, no tub baths for 48 hours for groin sticks. No lifting over 5 pounds for 3 days.  No Driving for 3 days  Heart Healthy Diabetic Diet No Metformin until Monday 08/23/16 watch diet closely.  We added Imdur to meds.    Follow-up Information    Quintella Reicherturner,  Traci R, MD Follow up.   Specialty:  Cardiology Why:  the office will call you with an appt  Contact information: [address] [place] [address] [phone]            Discharge Medications   Current Discharge Medication List    START taking these medications   Details  isosorbide mononitrate (IMDUR) 30 MG 24 hr tablet Take 0.5 tablets (15 mg total) by mouth daily. Qty: 30 tablet, Refills: 6      CONTINUE these medications which have CHANGED   Details  metFORMIN (GLUCOPHAGE) 500 MG tablet Take 1 tablet (500 mg total) by mouth 2 (two) times daily with a meal. Refills: 5      CONTINUE these medications which have NOT CHANGED   Details  amLODipine (NORVASC) 10 MG tablet Take 1 tablet by mouth daily. Refills: 2    Doxylamine Succinate, Sleep, (SLEEP AID PO) Take 1 tablet by mouth at bedtime.    DULoxetine (CYMBALTA) 60 MG capsule Take 1 capsule by mouth daily. Take a sleep aide for sleep. She does not know the name Refills: 4    glucose blood (CONTOUR NEXT TEST) test strip Check  blood sugars daily Qty: 100 each, Refills: 6   Associated Diagnoses: Type 2 diabetes mellitus without complication, without long-term current use of insulin (HCC)    hydrALAZINE (APRESOLINE) 50 MG tablet Take 1 tablet by mouth 2 (two) times daily. Refills: 7    LANCETS ULTRA THIN 30G MISC 1 Stick by Does not apply route daily. Qty: 100 each, Refills: 6   Associated Diagnoses: Type 2 diabetes mellitus without complication, without long-term current use of insulin (HCC)    levothyroxine (SYNTHROID, LEVOTHROID) 75 MCG tablet Take 1 tablet by mouth daily. Refills: 2    Omega-3 Fatty Acids (FISH OIL) 1000 MG CAPS Take 1 capsule by mouth daily.    potassium chloride SA (K-DUR,KLOR-CON) 20 MEQ tablet TAKE 1 TABLET BY MOUTH TWICE DAILY Qty: 60 tablet, Refills: 3    simvastatin (ZOCOR) 20 MG tablet Take 1 tablet by mouth 3 (three) times a week. Refills: 0    sotalol  (BETAPACE) 80 MG tablet Take 1 tablet by mouth 2 (two) times daily. Refills: 4    Tetrahydrozoline HCl (EYE DROPS OP) Apply 2 drops to eye as needed (Dry eyes).    valsartan-hydrochlorothiazide (DIOVAN-HCT) 160-25 MG tablet Take 1 tablet by mouth daily. Qty: 30 tablet, Refills: 11    XARELTO 20 MG TABS tablet Take 1 tablet by mouth at bedtime.  Refills: 5    nitroGLYCERIN (NITROSTAT) 0.4 MG SL tablet Place 1 tablet (0.4 mg total) under the tongue every 5 (five) minutes as needed for chest pain. Qty: 25 tablet, Refills: 6         Outstanding Labs/Studies   none  Duration of Discharge Encounter   Greater than 30 minutes including physician time.  Signed, Nada Boozer NP 08/21/2016, 11:18 AM

## 2016-08-23 ENCOUNTER — Encounter (HOSPITAL_COMMUNITY): Payer: Self-pay | Admitting: Internal Medicine

## 2016-08-23 ENCOUNTER — Other Ambulatory Visit: Payer: Self-pay | Admitting: Physician Assistant

## 2016-08-23 DIAGNOSIS — E2839 Other primary ovarian failure: Secondary | ICD-10-CM

## 2016-08-23 SURGERY — LEFT HEART CATH AND CORONARY ANGIOGRAPHY
Anesthesia: LOCAL

## 2016-08-26 ENCOUNTER — Other Ambulatory Visit: Payer: BLUE CROSS/BLUE SHIELD

## 2016-08-26 ENCOUNTER — Ambulatory Visit: Payer: BLUE CROSS/BLUE SHIELD

## 2016-08-26 ENCOUNTER — Ambulatory Visit
Admission: RE | Admit: 2016-08-26 | Discharge: 2016-08-26 | Disposition: A | Discharge status: Home / Self Care | Payer: BLUE CROSS/BLUE SHIELD | Source: Ambulatory Visit | Attending: Physician Assistant | Admitting: Physician Assistant

## 2016-08-26 DIAGNOSIS — Z1231 Encounter for screening mammogram for malignant neoplasm of breast: Secondary | ICD-10-CM

## 2016-08-26 DIAGNOSIS — E2839 Other primary ovarian failure: Secondary | ICD-10-CM

## 2016-08-27 ENCOUNTER — Encounter: Payer: Self-pay | Admitting: Emergency Medicine

## 2016-09-10 ENCOUNTER — Other Ambulatory Visit: Payer: Self-pay | Admitting: Physician Assistant

## 2016-09-10 DIAGNOSIS — M810 Age-related osteoporosis without current pathological fracture: Secondary | ICD-10-CM

## 2016-09-10 MED ORDER — ALENDRONATE SODIUM 70 MG PO TABS: 70.0000 mg | ORAL_TABLET | ORAL | 11 refills | Status: DC

## 2016-09-13 ENCOUNTER — Encounter: Payer: Self-pay | Admitting: Physician Assistant

## 2016-09-13 ENCOUNTER — Other Ambulatory Visit (INDEPENDENT_AMBULATORY_CARE_PROVIDER_SITE_OTHER): Payer: BLUE CROSS/BLUE SHIELD

## 2016-09-13 DIAGNOSIS — M810 Age-related osteoporosis without current pathological fracture: Secondary | ICD-10-CM

## 2016-09-13 LAB — VITAMIN D 25 HYDROXY (VIT D DEFICIENCY, FRACTURES): VITD: 20.21 ng/mL — AB (ref 30.00–100.00)

## 2016-09-13 NOTE — Progress Notes (Signed)
Cardiology Office Note    Date:  09/14/2016  ID:  Ashley Celesteikki R Piedra, DOB November 12, 1950, MRN [id number] PCP:  Waldon MerlMartin, William C, PA-C  Cardiologist:  Dr. Mayford Knifeurner    Chief Complaint: f/u cath  History of Present Illness:  Ashley Woodard is a 66 y.o. female with history of persistent atrial fibrillation, hypertension, diabetes mellitus, hypertriglyceridemia, obstructive sleep apnea, recent abnormal stress test (with no sig CAD on cath), thyroid disease who presents for post-cath follow-up.  She saw Dr. Mayford Knifeurner to establish care 08/11/16 -  She was followed in the past by a cardiologist in [place] and moved her care here..  She has had atrial fibrillation off and on for about 8 years. She has never had a cardioversion. She rarely will have a breakthrough. She has a history of OSA but was mild and she did not want to pursue CPAP. At that visit she was maintaining sinus bradycardia on Xarelto and Betapace. EKG was abnormal with TW changes in anterior precordial leads. Given CRFs and family history, nuclear stress test was arranged which was abnormal (Large size, moderate intensity reversible anteroapical, apical and lateral wall perfusion defect (SDS 8) suggestive of ischemia or less likely shifting breast artifact. LVEF 64% with normal wall motion). The patient presented to the office discuss further evaluation and management options, including cardiac catheterization. When she saw Dr End she denied chest pain, though she did have vague chest pressure across the upper chest for the last 2 days prior to admit. She was admitted directly for cath which showed no significant CAD, normal LV contraction, mildly elevated filling pressure. Low dose Imdur was added for possible component of microvascular dysfunction. Last labs 07/2016 showed normal CBC, BMET with Cr 0.92, K 3.6, A1C 7.1, LFTs wnl; LDL in 03/2016 was 49 with trig 429 - lipids followed by PCP. She is only on statin 3x a week because she states at one point her cholesterol  dropped to 10 and her physician advised she backed off. She is awaiting sleep study results from Dr. Mayford Knifeurner.  She returns for follow-up feeling well. She's back to the gym exercising without difficulty. Has not noticed any significant before/after change in the Imdur. Has noticed an occasional headache, but this has gotten better. She is interested in speaking to a dietician about her diet with regard to diabetes. No SOB, LEE, orthopnea. Atrial fib is well controlled, only rare breakthrough. No bleeding issues.   Past Medical History:  Diagnosis Date  . Abnormal cardiovascular stress test 08/21/2016   a. done for abnl EKG/cardiac risk factors -> admitted for cath 07/2016 which showed no significant CAD, normal LV contraction, mildly elevated filling pressure. Low dose Imdur was added for possible component of microvascular dysfunction.   Marland Kitchen. Anxiety   . Arthritis   . Colon polyps   . Deafness in right ear   . Depression   . Diabetes mellitus without complication (HCC)   . Diverticulosis   . Hypertension   . Hypertriglyceridemia   . Migraine   . Persistent atrial fibrillation (HCC)   . S/P cardiac catheterization, 08/20/16 minimal CAD  08/21/2016  . Sleep apnea   . Thyroid disease     Past Surgical History:  Procedure Laterality Date  . ABDOMINAL HYSTERECTOMY  1988  . COCHLEAR IMPLANT  2008  . LEFT HEART CATH AND CORONARY ANGIOGRAPHY N/A 08/20/2016   Procedure: Left Heart Cath and Coronary Angiography;  Surgeon: Yvonne KendallEnd, Christopher, MD;  Location: [facility];  Service: Cardiovascular;  Laterality: N/A;    Current Medications: Current Meds  Medication Sig  . alendronate (FOSAMAX) 70 MG tablet Take 1 tablet (70 mg total) by mouth every 7 (seven) days. Take with a full glass of water on an empty stomach.  Marland Kitchen amLODipine (NORVASC) 10 MG tablet Take 1 tablet by mouth daily.  . DULoxetine (CYMBALTA) 60 MG capsule Take 1 capsule by mouth daily. Take a sleep aide for sleep. She does not  know the name  . glucose blood (CONTOUR NEXT TEST) test strip Check blood sugars daily  . hydrALAZINE (APRESOLINE) 50 MG tablet Take 1 tablet by mouth 2 (two) times daily.  . isosorbide mononitrate (IMDUR) 30 MG 24 hr tablet Take 0.5 tablets (15 mg total) by mouth daily.  Marland Kitchen LANCETS ULTRA THIN 30G MISC 1 Stick by Does not apply route daily.  Marland Kitchen levothyroxine (SYNTHROID, LEVOTHROID) 75 MCG tablet Take 1 tablet by mouth daily.  . metFORMIN (GLUCOPHAGE) 500 MG tablet Take 1 tablet (500 mg total) by mouth 2 (two) times daily with a meal.  . nitroGLYCERIN (NITROSTAT) 0.4 MG SL tablet Place 1 tablet (0.4 mg total) under the tongue every 5 (five) minutes as needed for chest pain.  . Omega-3 Fatty Acids (FISH OIL) 1000 MG CAPS Take 1 capsule by mouth daily.  . potassium chloride SA (K-DUR,KLOR-CON) 20 MEQ tablet TAKE 1 TABLET BY MOUTH TWICE DAILY  . simvastatin (ZOCOR) 20 MG tablet Take 1 tablet by mouth 3 (three) times a week.  . sotalol (BETAPACE) 80 MG tablet Take 1 tablet by mouth 2 (two) times daily.  . Tetrahydrozoline HCl (EYE DROPS OP) Apply 2 drops to eye as needed (Dry eyes).  . valsartan-hydrochlorothiazide (DIOVAN-HCT) 160-25 MG tablet Take 1 tablet by mouth daily.  . Vitamin D, Ergocalciferol, (DRISDOL) 50000 units CAPS capsule Take 1 capsule (50,000 Units total) by mouth every 7 (seven) days.  Carlena Hurl 20 MG TABS tablet Take 1 tablet by mouth at bedtime.      Allergies:   Doxycycline; Erythromycin; Lisinopril; and Tetracyclines & related   Social History   Social History  . Marital status: Married    Spouse name: N/A  . Number of children: N/A  . Years of education: 1   Occupational History  . General Manager    Social History Main Topics  . Smoking status: Never Smoker  . Smokeless tobacco: Never Used  . Alcohol use No  . Drug use: No  . Sexual activity: Yes   Other Topics Concern  . None   Social History Narrative  . None     Family History:  Family History    Problem Relation Age of Onset  . Hypertension Mother   . Diabetes Mother   . Heart attack Mother   . Hypertension Father   . Heart attack Father   . Stroke Father   . Cancer Sister        Breast  . Hypertension Brother   . Cancer Brother        Prostate  . Diabetes Brother   . Dementia Brother   . Stroke Brother   . Heart attack Brother   . Hypertension Son   . Cancer Maternal Aunt        Breast  . Cancer Maternal Uncle        Lung    ROS:   Please see the history of present illness.  All other systems are reviewed and otherwise negative.    PHYSICAL EXAM:   VS:  BP 124/60   Pulse 60   Ht 5\' 7"  (1.702 m)   Wt 210 lb 12.8 oz (95.6 kg)   SpO2 97%   BMI 33.02 kg/m   BMI: Body mass index is 33.02 kg/m. GEN: Well nourished, well developed WF, in no acute distress  HEENT: normocephalic, atraumatic Neck: no JVD, carotid bruits, or masses Cardiac: RRR; no murmurs, rubs, or gallops, no edema  Respiratory:  clear to auscultation bilaterally, normal work of breathing GI: soft, nontender, nondistended, + BS MS: no deformity or atrophy  Skin: warm and dry, no rash. Right radial cath site without hematoma or ecchymosis; good pulse. Neuro:  Alert and Oriented x 3, Strength and sensation are intact, follows commands Psych: euthymic mood, full affect  Wt Readings from Last 3 Encounters:  09/14/16 210 lb 12.8 oz (95.6 kg)  08/21/16 209 lb 11.2 oz (95.1 kg)  08/19/16 215 lb (97.5 kg)      Studies/Labs Reviewed:   EKG:  EKG was not ordered today  Recent Labs: 04/12/2016: TSH 2.27 08/03/2016: ALT 21 08/19/2016: BUN 15; Creatinine, Ser 0.92; Hemoglobin 13.3; Platelets 350; Potassium 3.6; Sodium 139   Lipid Panel    Component Value Date/Time   CHOL 129 04/12/2016   TRIG 429 (A) 04/12/2016   HDL 37 04/12/2016   LDLCALC 49 04/12/2016    Additional studies/ records that were reviewed today include: Summarized above.    ASSESSMENT & PLAN:   1. Abnormal stress  test/abnormal EKG - cath reassuring. I reviewed her prior EKGs and do see the TW changes that Dr. Mayford Knife noted as abnormal. LVEF was normal by cath. She denies any recent anginal sx. She's been able to exercise without any functional limitation. She's had some headaches with Imdur (started empirically in case of microvascular angina). I told her I thought she could stop this if she did not see any benefit from this. Given her EKG abnormality, I do think updating echo to r/o cardiomyopathy would be prudent. This is non-urgent. 2. Persistent atrial fib - maintaining NSR on sotalol with only rare breakthrough. Further monitoring per primary cardiologist. She's not had any bleeding issues. 3. Essential HTN - controlled, follow. 4. Hypertriglyceridemia - lipids traditionally followed by PCP. Guidelines would recommend that she be on at least a moderate intensity statin given her DM, but she reports previous LDL dropping to 10 on standard therapy. Most recent LDL was 49. I will defer further decision making regarding this to her primary care given absence of CAD. She also states she's been on fish oil for her high trigs. She wishes to be referred to dietician to work on her diet more closely. Will refer. 5. OSA  - pt is awaiting results of sleep study from Dr. Mayford Knife.  Disposition: F/u with Dr. Mayford Knife per prior recall 01/2017.   Medication Adjustments/Labs and Tests Ordered: Current medicines are reviewed at length with the patient today.  Concerns regarding medicines are outlined above. Medication changes, Labs and Tests ordered today are summarized above and listed in the Patient Instructions accessible in Encounters.   Signed, Laurann Montana, PA-C  09/14/2016 3:42 PM    [facility] [address] [place], [place], [place]  [address] Phone: [phone]; Fax: [phone]

## 2016-09-14 ENCOUNTER — Other Ambulatory Visit: Payer: Self-pay | Admitting: Physician Assistant

## 2016-09-14 ENCOUNTER — Encounter: Payer: Self-pay | Admitting: Physician Assistant

## 2016-09-14 ENCOUNTER — Ambulatory Visit (INDEPENDENT_AMBULATORY_CARE_PROVIDER_SITE_OTHER): Payer: BLUE CROSS/BLUE SHIELD | Admitting: Physician Assistant

## 2016-09-14 VITALS — BP 124/60 | HR 60 | Ht 67.0 in | Wt 210.8 lb

## 2016-09-14 DIAGNOSIS — G4733 Obstructive sleep apnea (adult) (pediatric): Secondary | ICD-10-CM

## 2016-09-14 DIAGNOSIS — I10 Essential (primary) hypertension: Secondary | ICD-10-CM

## 2016-09-14 DIAGNOSIS — I4819 Other persistent atrial fibrillation: Secondary | ICD-10-CM

## 2016-09-14 DIAGNOSIS — E559 Vitamin D deficiency, unspecified: Secondary | ICD-10-CM

## 2016-09-14 DIAGNOSIS — R9431 Abnormal electrocardiogram [ECG] [EKG]: Secondary | ICD-10-CM | POA: Diagnosis not present

## 2016-09-14 DIAGNOSIS — E781 Pure hyperglyceridemia: Secondary | ICD-10-CM | POA: Diagnosis not present

## 2016-09-14 DIAGNOSIS — R9439 Abnormal result of other cardiovascular function study: Secondary | ICD-10-CM

## 2016-09-14 DIAGNOSIS — I481 Persistent atrial fibrillation: Secondary | ICD-10-CM | POA: Diagnosis not present

## 2016-09-14 MED ORDER — VITAMIN D (ERGOCALCIFEROL) 1.25 MG (50000 UNIT) PO CAPS: 50000.0000 [IU] | ORAL_CAPSULE | ORAL | 0 refills | Status: DC

## 2016-09-14 NOTE — Patient Instructions (Addendum)
Medication Instructions:  Your physician recommends that you continue on your current medications as directed. Please refer to the Current Medication list given to you today.   Labwork: None ordered  Testing/Procedures: Your physician has requested that you have an echocardiogram. Echocardiography is a painless test that uses sound waves to create images of your heart. It provides your doctor with information about the size and shape of your heart and how well your heart's chambers and valves are working. This procedure takes approximately one hour. There are no restrictions for this procedure.    Follow-Up: Your physician recommends that you schedule a follow-up appointment in: KEEP YOUR FOLLOW-UP AS PLANNED    You have been referred to DIABETES & NUTRITION.  THEY WILL CALL YOU WITH AN APPOINTMENT.   Echocardiogram An echocardiogram, or echocardiography, uses sound waves (ultrasound) to produce an image of your heart. The echocardiogram is simple, painless, obtained within a short period of time, and offers valuable information to your health care provider. The images from an echocardiogram can provide information such as:  Evidence of coronary artery disease (CAD).  Heart size.  Heart muscle function.  Heart valve function.  Aneurysm detection.  Evidence of a past heart attack.  Fluid buildup around the heart.  Heart muscle thickening.  Assess heart valve function.  Tell a health care provider about:  Any allergies you have.  All medicines you are taking, including vitamins, herbs, eye drops, creams, and over-the-counter medicines.  Any problems you or family members have had with anesthetic medicines.  Any blood disorders you have.  Any surgeries you have had.  Any medical conditions you have.  Whether you are pregnant or may be pregnant. What happens before the procedure? No special preparation is needed. Eat and drink normally. What happens during the  procedure?  In order to produce an image of your heart, gel will be applied to your chest and a wand-like tool (transducer) will be moved over your chest. The gel will help transmit the sound waves from the transducer. The sound waves will harmlessly bounce off your heart to allow the heart images to be captured in real-time motion. These images will then be recorded.  You may need an IV to receive a medicine that improves the quality of the pictures. What happens after the procedure? You may return to your normal schedule including diet, activities, and medicines, unless your health care provider tells you otherwise. This information is not intended to replace advice given to you by your health care provider. Make sure you discuss any questions you have with your health care provider. Document Released: 02/06/2000 Document Revised: 09/27/2015 Document Reviewed: 10/16/2012 Elsevier Interactive Patient Education  2017 [employer]  Any Other Special Instructions Will Be Listed Below (If Applicable).     If you need a refill on your cardiac medications before your next appointment, please call your pharmacy.

## 2016-09-20 ENCOUNTER — Telehealth: Payer: Self-pay | Admitting: *Deleted

## 2016-09-20 ENCOUNTER — Other Ambulatory Visit: Payer: Self-pay

## 2016-09-20 ENCOUNTER — Ambulatory Visit (HOSPITAL_COMMUNITY): Payer: BLUE CROSS/BLUE SHIELD | Attending: Internal Medicine

## 2016-09-20 DIAGNOSIS — I313 Pericardial effusion (noninflammatory): Secondary | ICD-10-CM | POA: Insufficient documentation

## 2016-09-20 DIAGNOSIS — I481 Persistent atrial fibrillation: Secondary | ICD-10-CM | POA: Insufficient documentation

## 2016-09-20 DIAGNOSIS — R9439 Abnormal result of other cardiovascular function study: Secondary | ICD-10-CM | POA: Diagnosis not present

## 2016-09-20 DIAGNOSIS — I4819 Other persistent atrial fibrillation: Secondary | ICD-10-CM

## 2016-09-20 NOTE — Telephone Encounter (Signed)
-----   Message from Quintella Reichertraci R Turner, MD sent at 09/13/2016  7:58 PM EDT ----- No significant OSA or O2 desats.

## 2016-09-20 NOTE — Telephone Encounter (Signed)
Informed patient of sleep study results and patient understanding was verbalized. Patient understands she does not have OSA or drops in oxygen. Patient understands she has no further appointments scheduled. Patient was grateful for the call and thanked me.

## 2016-09-21 ENCOUNTER — Telehealth: Payer: Self-pay | Admitting: *Deleted

## 2016-09-21 MED ORDER — HYDROCHLOROTHIAZIDE 25 MG PO TABS: 25.0000 mg | ORAL_TABLET | Freq: Every day | ORAL | 1 refills | Status: DC

## 2016-09-21 MED ORDER — IRBESARTAN 150 MG PO TABS: 150.0000 mg | ORAL_TABLET | Freq: Every day | ORAL | 1 refills | Status: DC

## 2016-09-21 NOTE — Telephone Encounter (Signed)
Irbesartan HCTZ 80/12.5 not available, will replace valsartan /HCTZ 160/25 with Irbesartan 150 mg daily and HCTZ 25 mg daily, pt aware

## 2016-09-21 NOTE — Telephone Encounter (Signed)
Notes recorded by Ashley Woodard, Ashley Woodard M, RN on 09/21/2016 at 1:32 PM EDT I reviewed with Ashley MilletMegan, pharmacist. Per Ashley MilletMegan can replace valsartan/HCTZ 160/25 mg daily with Irbesartan HCTZ 80/12.5 (2) tablets daily or Irbesartan 150 mg and HCTZ 25 mg.  I discussed with patient, pt prefers to replace valsartan/HCTZ 160/25 daily with Irbesartan HCTZ 80/12.5 mg (2) tablets daily. ------

## 2016-09-24 ENCOUNTER — Other Ambulatory Visit: Payer: Self-pay | Admitting: Physician Assistant

## 2016-09-24 MED ORDER — VITAMIN D (ERGOCALCIFEROL) 1.25 MG (50000 UNIT) PO CAPS: 50000.0000 [IU] | ORAL_CAPSULE | ORAL | 0 refills | Status: DC

## 2016-10-11 ENCOUNTER — Encounter: Payer: BLUE CROSS/BLUE SHIELD | Attending: Physician Assistant | Admitting: Registered"

## 2016-10-11 ENCOUNTER — Encounter: Payer: Self-pay | Admitting: Registered"

## 2016-10-11 ENCOUNTER — Encounter: Payer: Self-pay | Admitting: Physician Assistant

## 2016-10-11 DIAGNOSIS — Z713 Dietary counseling and surveillance: Secondary | ICD-10-CM | POA: Diagnosis not present

## 2016-10-11 DIAGNOSIS — E781 Pure hyperglyceridemia: Secondary | ICD-10-CM | POA: Diagnosis not present

## 2016-10-11 DIAGNOSIS — E119 Type 2 diabetes mellitus without complications: Secondary | ICD-10-CM

## 2016-10-11 DIAGNOSIS — E559 Vitamin D deficiency, unspecified: Secondary | ICD-10-CM

## 2016-10-11 NOTE — Progress Notes (Signed)
Diabetes Self-Management Education  Visit Type: First/Initial  Appt. Start Time: 0925 Appt. End Time: 1100  10/11/2016  Ms. Sunoco, identified by name and date of birth, is a 66 y.o. female with a diagnosis of Diabetes: Type 2.   ASSESSMENT This patient is accompanied in the office by her spouse. Pt states she would like to learn about how to eat to control blood sugar as well as heart health recommendations. Pt reports she started Low carb 3 months ago and has felt low energy.  Pt's spouse states he is is retired and enjoys doing the cooking. Pt reports eating out one night per week. Pt states she doesn't eat many snacks, but because lunch and dinner are far apart she feels hungry before dinner especially when working out.  Pt is has low vitamin D 20.21 per chart 4 wks ago. Pt states she uses a sleep aid to counter effect of duloxetine. Pt states that racing mind also affects sleep. Pt states she drinks ~3 cups coffee during the morning and tea around lunch time. Pt reports that caffeine doesn't seem to keep her from going to sleep.     Diabetes Self-Management Education - 10/11/16 0935      Visit Information   Visit Type First/Initial     Initial Visit   Diabetes Type Type 2   Are you currently following a meal plan? Yes   What type of meal plan do you follow? low carb   Are you taking your medications as prescribed? Yes   Date Diagnosed ~15 years ago     Health Coping   How would you rate your overall health? Fair     Psychosocial Assessment   Patient Belief/Attitude about Diabetes Other (comment)  struggle, loves sweets and breads   How often do you need to have someone help you when you read instructions, pamphlets, or other written materials from your doctor or pharmacy? 1 - Never   What is the last grade level you completed in school? 12th     Complications   Last HgB A1C per patient/outside source 7.1 %  08/03/16   How often do you check your blood sugar? 3-4 times  / week   Fasting Blood glucose range (mg/dL) --  818-299 couple of times 158 2 wks ago   Number of hypoglycemic episodes per month 0   Number of hyperglycemic episodes per week 0   Have you had a dilated eye exam in the past 12 months? Yes   Have you had a dental exam in the past 12 months? Yes   Are you checking your feet? --  doctor checks 3-6 months     Dietary Intake   Breakfast low carb mufin, fruit, coffee OR bacon and eggs   Snack (morning) none OR cheese or nuts OR berries   Lunch salad OR left overs   Snack (afternoon) none   Dinner girlled or baked meat, vegetable   Snack (evening) none   Beverage(s) water, coffee, unsweet tea, diet cranberry juice     Exercise   Exercise Type Moderate (swimming / aerobic walking)   How many days per week to you exercise? 2   How many minutes per day do you exercise? 60   Total minutes per week of exercise 120     Patient Education   Previous Diabetes Education No   Disease state  Definition of diabetes, type 1 and 2, and the diagnosis of diabetes   Nutrition management  Role of  diet in the treatment of diabetes and the relationship between the three main macronutrients and blood glucose level;Food label reading, portion sizes and measuring food.   Physical activity and exercise  Role of exercise on diabetes management, blood pressure control and cardiac health.   Medications Reviewed patients medication for diabetes, action, purpose, timing of dose and side effects.   Monitoring Identified appropriate SMBG and/or A1C goals.   Acute complications Taught treatment of hypoglycemia - the 15 rule.   Psychosocial adjustment Role of stress on diabetes     Individualized Goals (developed by patient)   Nutrition General guidelines for healthy choices and portions discussed   Physical Activity Exercise 5-7 days per week     Outcomes   Expected Outcomes Demonstrated interest in learning. Expect positive outcomes   Future DMSE 4-6 wks    Program Status Not Completed      Individualized Plan for Diabetes Self-Management Training:   Learning Objective:  Patient will have a greater understanding of diabetes self-management. Patient education plan is to attend individual and/or group sessions per assessed needs and concerns.   Patient Instructions   Check with your doctor or pharmacist about taking potassium with Amlodipine  Mindfulness Based Stress Reduction course: [email or web address].[email or web address]/  Consider taking a B12 vitamin due to Metformin and talk to your doctor about symptoms you described  Plan for eating:  Aim for ~45-50 grams carbs for meals Aim for 0-15 grams Carbs per snack if hungry  Include protein with your meals and snacks Consider reading food labels for Total Carbohydrate Consider increasing your activity daily as tolerated, check with doctor before adding too much vigorous activity. Consider checking BG at alternate times per day as directed by MD  Continue taking medication as directed by MD For heart health check nutrition facts label for Trans fat close to zero, Sat fat under 6 g per meal, and lower sodium.  Expected Outcomes:  Demonstrated interest in learning. Expect positive outcomes  Education material provided: Living Well with Diabetes, A1C conversion sheet, My Plate, Snack sheet and Carbohydrate counting sheet  If problems or questions, patient to contact team via:  Phone and MyChart  Future DSME appointment: 4-6 wks

## 2016-10-11 NOTE — Patient Instructions (Addendum)
   Check with your doctor or pharmacist about taking potassium with Amlodipine  Mindfulness Based Stress Reduction course: [email or web address].com/  Consider taking a B12 vitamin due to Metformin and talk to your doctor about symptoms you described  Plan for eating:  Aim for ~45-50 grams carbs for meals Aim for 0-15 grams Carbs per snack if hungry  Include protein with your meals and snacks Consider reading food labels for Total Carbohydrate Consider increasing your activity daily as tolerated, check with doctor before adding too much vigorous activity. Consider checking BG at alternate times per day as directed by MD  Continue taking medication as directed by MD For heart health check nutrition facts label for Trans fat close to zero, Sat fat under 6 g per meal, and lower sodium.

## 2016-10-15 NOTE — Telephone Encounter (Signed)
Orders placed.

## 2016-10-15 NOTE — Telephone Encounter (Signed)
Please place orders so pt may have labs done in [facility].

## 2016-10-20 ENCOUNTER — Encounter: Payer: Self-pay | Admitting: Cardiology

## 2016-10-21 ENCOUNTER — Encounter: Payer: Self-pay | Admitting: Physician Assistant

## 2016-10-23 MED ORDER — SIMVASTATIN 20 MG PO TABS: 20.0000 mg | ORAL_TABLET | ORAL | 1 refills | Status: DC

## 2016-11-05 ENCOUNTER — Other Ambulatory Visit (INDEPENDENT_AMBULATORY_CARE_PROVIDER_SITE_OTHER): Payer: BLUE CROSS/BLUE SHIELD

## 2016-11-05 DIAGNOSIS — E119 Type 2 diabetes mellitus without complications: Secondary | ICD-10-CM

## 2016-11-05 DIAGNOSIS — E559 Vitamin D deficiency, unspecified: Secondary | ICD-10-CM

## 2016-11-05 LAB — BASIC METABOLIC PANEL
BUN: 15 mg/dL (ref 6–23)
CALCIUM: 9.6 mg/dL (ref 8.4–10.5)
CHLORIDE: 105 meq/L (ref 96–112)
CO2: 27 mEq/L (ref 19–32)
CREATININE: 0.78 mg/dL (ref 0.40–1.20)
GFR: 78.66 mL/min (ref >60.00)
Glucose, Bld: 144 mg/dL — ABNORMAL HIGH (ref 70–99)
Potassium: 3.6 mEq/L (ref 3.5–5.1)
Sodium: 141 mEq/L (ref 135–145)

## 2016-11-05 LAB — VITAMIN B12: Vitamin B-12: 168 pg/mL — ABNORMAL LOW (ref 211–911)

## 2016-11-05 LAB — VITAMIN D 25 HYDROXY (VIT D DEFICIENCY, FRACTURES): VITD: 37.32 ng/mL (ref 30.00–100.00)

## 2016-11-05 LAB — HEMOGLOBIN A1C: HEMOGLOBIN A1C: 6.4 % (ref 4.6–6.5)

## 2016-11-08 ENCOUNTER — Ambulatory Visit: Payer: BLUE CROSS/BLUE SHIELD | Admitting: Registered"

## 2016-11-08 ENCOUNTER — Other Ambulatory Visit: Payer: Self-pay | Admitting: Physician Assistant

## 2016-11-08 DIAGNOSIS — E538 Deficiency of other specified B group vitamins: Secondary | ICD-10-CM

## 2016-11-08 MED ORDER — VITAMIN B-12 1000 MCG PO TABS: 1000.0000 ug | ORAL_TABLET | Freq: Every day | ORAL | 0 refills | Status: DC

## 2016-12-06 ENCOUNTER — Encounter: Payer: Self-pay | Admitting: Physician Assistant

## 2016-12-06 ENCOUNTER — Ambulatory Visit (INDEPENDENT_AMBULATORY_CARE_PROVIDER_SITE_OTHER): Payer: BLUE CROSS/BLUE SHIELD | Admitting: Physician Assistant

## 2016-12-06 VITALS — BP 162/80 | HR 60 | Temp 98.2°F | Resp 14 | Ht 67.0 in | Wt 203.0 lb

## 2016-12-06 DIAGNOSIS — I10 Essential (primary) hypertension: Secondary | ICD-10-CM | POA: Diagnosis not present

## 2016-12-06 LAB — BASIC METABOLIC PANEL
BUN: 18 mg/dL (ref 6–23)
CHLORIDE: 103 meq/L (ref 96–112)
CO2: 30 meq/L (ref 19–32)
CREATININE: 0.86 mg/dL (ref 0.40–1.20)
Calcium: 9.7 mg/dL (ref 8.4–10.5)
GFR: 70.26 mL/min (ref >60.00)
Glucose, Bld: 135 mg/dL — ABNORMAL HIGH (ref 70–99)
POTASSIUM: 3.4 meq/L — AB (ref 3.5–5.1)
SODIUM: 141 meq/L (ref 135–145)

## 2016-12-06 NOTE — Progress Notes (Signed)
Patient presents to clinic today c/o elevated BP measurements over the past couple of months. Patient currently treated by Cardiology for hypertension. Is currently on a multi-drug regimen. Due to recent recall of valsartan, this was switched to irbesartan 150 mg. Has continued other medication as directed. Endorses headaches. Patient denies chest pain, palpitations, lightheadedness, dizziness, vision changes. Is staying active and keeping a low-salt diet.   BP Readings from Last 3 Encounters:  12/06/16 (!) 162/80  09/14/16 124/60  08/21/16 135/68    Past Medical History:  Diagnosis Date  . Abnormal cardiovascular stress test 08/21/2016   a. done for abnl EKG/cardiac risk factors -> admitted for cath 07/2016 which showed no significant CAD, normal LV contraction, mildly elevated filling pressure. Low dose Imdur was added for possible component of microvascular dysfunction.   Marland Kitchen Anxiety   . Arthritis   . Colon polyps   . Deafness in right ear   . Depression   . Diabetes mellitus without complication (HCC)   . Diverticulosis   . Hypertension   . Hypertriglyceridemia   . Migraine   . Persistent atrial fibrillation (HCC)   . S/P cardiac catheterization, 08/20/16 minimal CAD  08/21/2016  . Sleep apnea   . Thyroid disease     Current Outpatient Prescriptions on File Prior to Visit  Medication Sig Dispense Refill  . alendronate (FOSAMAX) 70 MG tablet Take 1 tablet (70 mg total) by mouth every 7 (seven) days. Take with a full glass of water on an empty stomach. 4 tablet 11  . amLODipine (NORVASC) 10 MG tablet Take 1 tablet by mouth daily.  2  . Cholecalciferol (VITAMIN D PO) Take by mouth.    . DULoxetine (CYMBALTA) 60 MG capsule Take 1 capsule by mouth daily. Take a sleep aide for sleep. She does not know the name  4  . glucose blood (CONTOUR NEXT TEST) test strip Check blood sugars daily 100 each 6  . hydrALAZINE (APRESOLINE) 50 MG tablet Take 1 tablet by mouth 2 (two) times daily.  7    . hydrochlorothiazide (HYDRODIURIL) 25 MG tablet Take 1 tablet (25 mg total) by mouth daily. 90 tablet 1  . irbesartan (AVAPRO) 150 MG tablet Take 1 tablet (150 mg total) by mouth daily. 90 tablet 1  . LANCETS ULTRA THIN 30G MISC 1 Stick by Does not apply route daily. 100 each 6  . levothyroxine (SYNTHROID, LEVOTHROID) 75 MCG tablet Take 1 tablet by mouth daily.  2  . metFORMIN (GLUCOPHAGE) 500 MG tablet Take 1 tablet (500 mg total) by mouth 2 (two) times daily with a meal.  5  . Omega-3 Fatty Acids (FISH OIL) 1000 MG CAPS Take 1 capsule by mouth daily.    . potassium chloride SA (K-DUR,KLOR-CON) 20 MEQ tablet TAKE 1 TABLET BY MOUTH TWICE DAILY 60 tablet 3  . simvastatin (ZOCOR) 20 MG tablet Take 1 tablet (20 mg total) by mouth 3 (three) times a week. 90 tablet 1  . sotalol (BETAPACE) 80 MG tablet Take 1 tablet by mouth 2 (two) times daily.  4  . Tetrahydrozoline HCl (EYE DROPS OP) Apply 2 drops to eye as needed (Dry eyes).    . vitamin B-12 (CYANOCOBALAMIN) 1000 MCG tablet Take 1 tablet (1,000 mcg total) by mouth daily. 30 tablet 0  . XARELTO 20 MG TABS tablet Take 1 tablet by mouth at bedtime.   5   No current facility-administered medications on file prior to visit.     Allergies  Allergen Reactions  .  Doxycycline Rash  . Erythromycin Rash  . Lisinopril Cough  . Tetracyclines & Related Rash    Family History  Problem Relation Age of Onset  . Hypertension Mother   . Diabetes Mother   . Heart attack Mother   . Hypertension Father   . Heart attack Father   . Stroke Father   . Cancer Sister        Breast  . Hypertension Brother   . Cancer Brother        Prostate  . Diabetes Brother   . Dementia Brother   . Stroke Brother   . Heart attack Brother   . Hypertension Son   . Cancer Maternal Aunt        Breast  . Cancer Maternal Uncle        Lung    Social History   Social History  . Marital status: Married    Spouse name: N/A  . Number of children: N/A  . Years of  education: 7   Occupational History  . General Manager    Social History Main Topics  . Smoking status: Never Smoker  . Smokeless tobacco: Never Used  . Alcohol use No  . Drug use: No  . Sexual activity: Yes   Other Topics Concern  . None   Social History Narrative  . None   Review of Systems - See HPI.  All other ROS are negative.  BP (!) 162/80   Pulse 60   Temp 98.2 F (36.8 C) (Oral)   Resp 14   Ht  (1.702 m)   Wt 203 lb (92.1 kg)   SpO2 98%   BMI 31.79 kg/m   Physical Exam  Constitutional: She is oriented to person, place, and time and well-developed, well-nourished, and in no distress.  HENT:  Head: Normocephalic and atraumatic.  Right Ear: External ear normal.  Left Ear: External ear normal.  Nose: Nose normal.  Mouth/Throat: Oropharynx is clear and moist. No oropharyngeal exudate.  TM within normal limits bilaterally.  Eyes: Conjunctivae are normal.  Neck: Neck supple.  Cardiovascular: Normal rate, regular rhythm, normal heart sounds and intact distal pulses.   Pulmonary/Chest: Effort normal and breath sounds normal. No respiratory distress. She has no wheezes. She has no rales. She exhibits no tenderness.  Lymphadenopathy:    She has no cervical adenopathy.  Neurological: She is alert and oriented to person, place, and time.  Skin: Skin is warm and dry. No rash noted.  Psychiatric: Affect normal.  Vitals reviewed.  Recent Results (from the past 2160 hour(s))  Vitamin D (25 hydroxy)     Status: Abnormal   Collection Time: 09/13/16 10:44 AM  Result Value Ref Range   VITD 20.21 (L) 30.00 - 100.00 ng/mL  Basic metabolic panel     Status: Abnormal   Collection Time: 11/05/16  8:06 AM  Result Value Ref Range   Sodium 141 135 - 145 mEq/L   Potassium 3.6 3.5 - 5.1 mEq/L   Chloride 105 96 - 112 mEq/L   CO2 27 19 - 32 mEq/L   Glucose, Bld 144 (H) 70 - 99 mg/dL   BUN 15 6 - 23 mg/dL   Creatinine, Ser 1.61 0.40 - 1.20 mg/dL   Calcium 9.6 8.4 - 09.6  mg/dL   GFR 04.54 >09.81 mL/min  B12     Status: Abnormal   Collection Time: 11/05/16  8:06 AM  Result Value Ref Range   Vitamin B-12 168 (L) 211 - 911  pg/mL  Hemoglobin A1c     Status: None   Collection Time: 11/05/16  8:06 AM  Result Value Ref Range   Hgb A1c MFr Bld 6.4 4.6 - 6.5 %    Comment: Glycemic Control Guidelines for People with Diabetes:Non Diabetic:  <6%Goal of Therapy: <7%Additional Action Suggested:  >8%   Vitamin D (25 hydroxy)     Status: None   Collection Time: 11/05/16  8:06 AM  Result Value Ref Range   VITD 37.32 30.00 - 100.00 ng/mL   Assessment/Plan: Hypertension Increase Irbesartan to 300 mg daily. DASH diet . BMP today. Follow-up 1 week.    Piedad Climes, PA-C

## 2016-12-06 NOTE — Assessment & Plan Note (Signed)
Increase Irbesartan to 300 mg daily. DASH diet . BMP today. Follow-up 1 week.

## 2016-12-06 NOTE — Progress Notes (Signed)
Pre visit review using our clinic review tool, if applicable. No additional management support is needed unless otherwise documented below in the visit note. 

## 2016-12-06 NOTE — Patient Instructions (Signed)
Please go to the lab today for blood work.  I will call you with your results.  Keep low-salt diet.  Increase the Irbesartan to 300 mg daily (You can take 2 of your 150 mg daily.   Follow-up with me in 1 week. If you note any chest pain, dizziness or shortness of breath -- call 911.

## 2016-12-07 DIAGNOSIS — J383 Other diseases of vocal cords: Secondary | ICD-10-CM | POA: Insufficient documentation

## 2016-12-07 MED ORDER — AMLODIPINE BESYLATE 10 MG PO TABS: 10.0000 mg | ORAL_TABLET | Freq: Every day | ORAL | 2 refills | Status: DC

## 2016-12-13 ENCOUNTER — Ambulatory Visit (INDEPENDENT_AMBULATORY_CARE_PROVIDER_SITE_OTHER): Payer: BLUE CROSS/BLUE SHIELD | Admitting: Physician Assistant

## 2016-12-13 ENCOUNTER — Encounter: Payer: Self-pay | Admitting: Physician Assistant

## 2016-12-13 VITALS — BP 128/70 | HR 61 | Temp 98.0°F | Resp 14 | Ht 67.0 in | Wt 203.0 lb

## 2016-12-13 DIAGNOSIS — I10 Essential (primary) hypertension: Secondary | ICD-10-CM

## 2016-12-13 NOTE — Patient Instructions (Signed)
Please continue BP medications as directed. Check your BP twice daily -- once in the morning and once in the afternoon.  Write these down. If not continuing to improve daily, will need further assessment.  Follow-up with me in 1 week via phone with your readings.  Start saline nasal rinse and restart your Flonase.

## 2016-12-13 NOTE — Progress Notes (Signed)
Patient presents to clinic today for follow-up of hypertension after restarting her amlodipine in addition to other chronic anti-hypertensive agents. Is taking as directed. Patient denies chest pain, palpitations, lightheadedness, dizziness, vision changes or frequent headaches.  BP Readings from Last 3 Encounters:  12/13/16 128/70  12/06/16 (!) 162/80  09/14/16 124/60   Past Medical History:  Diagnosis Date  . Abnormal cardiovascular stress test 08/21/2016   a. done for abnl EKG/cardiac risk factors -> admitted for cath 07/2016 which showed no significant CAD, normal LV contraction, mildly elevated filling pressure. Low dose Imdur was added for possible component of microvascular dysfunction.   Marland Kitchen. Anxiety   . Arthritis   . Colon polyps   . Deafness in right ear   . Depression   . Diabetes mellitus without complication (HCC)   . Diverticulosis   . Hypertension   . Hypertriglyceridemia   . Migraine   . Persistent atrial fibrillation (HCC)   . S/P cardiac catheterization, 08/20/16 minimal CAD  08/21/2016  . Sleep apnea   . Thyroid disease     Current Outpatient Prescriptions on File Prior to Visit  Medication Sig Dispense Refill  . alendronate (FOSAMAX) 70 MG tablet Take 1 tablet (70 mg total) by mouth every 7 (seven) days. Take with a full glass of water on an empty stomach. 4 tablet 11  . amLODipine (NORVASC) 10 MG tablet Take 1 tablet (10 mg total) by mouth daily. 30 tablet 2  . Cholecalciferol (VITAMIN D PO) Take by mouth.    . DULoxetine (CYMBALTA) 60 MG capsule Take 1 capsule by mouth daily. Take a sleep aide for sleep. She does not know the name  4  . glucose blood (CONTOUR NEXT TEST) test strip Check blood sugars daily 100 each 6  . hydrALAZINE (APRESOLINE) 50 MG tablet Take 1 tablet by mouth 2 (two) times daily.  7  . hydrochlorothiazide (HYDRODIURIL) 25 MG tablet Take 1 tablet (25 mg total) by mouth daily. 90 tablet 1  . irbesartan (AVAPRO) 150 MG tablet Take 1 tablet (150  mg total) by mouth daily. 90 tablet 1  . LANCETS ULTRA THIN 30G MISC 1 Stick by Does not apply route daily. 100 each 6  . levothyroxine (SYNTHROID, LEVOTHROID) 75 MCG tablet Take 1 tablet by mouth daily.  2  . metFORMIN (GLUCOPHAGE) 500 MG tablet Take 1 tablet (500 mg total) by mouth 2 (two) times daily with a meal.  5  . Omega-3 Fatty Acids (FISH OIL) 1000 MG CAPS Take 1 capsule by mouth daily.    . potassium chloride SA (K-DUR,KLOR-CON) 20 MEQ tablet TAKE 1 TABLET BY MOUTH TWICE DAILY 60 tablet 3  . simvastatin (ZOCOR) 20 MG tablet Take 1 tablet (20 mg total) by mouth 3 (three) times a week. 90 tablet 1  . sotalol (BETAPACE) 80 MG tablet Take 1 tablet by mouth 2 (two) times daily.  4  . Tetrahydrozoline HCl (EYE DROPS OP) Apply 2 drops to eye as needed (Dry eyes).    . vitamin B-12 (CYANOCOBALAMIN) 1000 MCG tablet Take 1 tablet (1,000 mcg total) by mouth daily. 30 tablet 0  . XARELTO 20 MG TABS tablet Take 1 tablet by mouth at bedtime.   5   No current facility-administered medications on file prior to visit.     Allergies  Allergen Reactions  . Demeclocycline Rash  . Doxycycline Rash  . Erythromycin Rash  . Lisinopril Cough  . Tetracyclines & Related Rash    Family History  Problem  Relation Age of Onset  . Hypertension Mother   . Diabetes Mother   . Heart attack Mother   . Hypertension Father   . Heart attack Father   . Stroke Father   . Cancer Sister        Breast  . Hypertension Brother   . Cancer Brother        Prostate  . Diabetes Brother   . Dementia Brother   . Stroke Brother   . Heart attack Brother   . Hypertension Son   . Cancer Maternal Aunt        Breast  . Cancer Maternal Uncle        Lung    Social History   Social History  . Marital status: Married    Spouse name: N/A  . Number of children: N/A  . Years of education: 62   Occupational History  . General Manager    Social History Main Topics  . Smoking status: Never Smoker  . Smokeless  tobacco: Never Used  . Alcohol use No  . Drug use: No  . Sexual activity: Yes   Other Topics Concern  . None   Social History Narrative  . None   Review of Systems - See HPI.  All other ROS are negative.  BP 128/70   Pulse 61   Temp 98 F (36.7 C) (Oral)   Resp 14   Ht 5\' 7"  (1.702 m)   Wt 203 lb (92.1 kg)   SpO2 97%   BMI 31.79 kg/m   Physical Exam  Constitutional: She is oriented to person, place, and time and well-developed, well-nourished, and in no distress.  HENT:  Head: Normocephalic and atraumatic.  Right Ear: External ear normal.  Left Ear: External ear normal.  Nose: Nose normal.  Mouth/Throat: Oropharynx is clear and moist.  Eyes: Conjunctivae are normal.  Cardiovascular: Normal rate, regular rhythm, normal heart sounds and intact distal pulses.   Pulmonary/Chest: Effort normal and breath sounds normal. No respiratory distress. She has no wheezes. She has no rales. She exhibits no tenderness.  Neurological: She is alert and oriented to person, place, and time.  Skin: Skin is warm and dry. No rash noted.  Psychiatric: Affect normal.  Vitals reviewed.  Recent Results (from the past 2160 hour(s))  Basic metabolic panel     Status: Abnormal   Collection Time: 11/05/16  8:06 AM  Result Value Ref Range   Sodium 141 135 - 145 mEq/L   Potassium 3.6 3.5 - 5.1 mEq/L   Chloride 105 96 - 112 mEq/L   CO2 27 19 - 32 mEq/L   Glucose, Bld 144 (H) 70 - 99 mg/dL   BUN 15 6 - 23 mg/dL   Creatinine, Ser 1.61 0.40 - 1.20 mg/dL   Calcium 9.6 8.4 - 09.6 mg/dL   GFR 04.54 >09.81 mL/min  B12     Status: Abnormal   Collection Time: 11/05/16  8:06 AM  Result Value Ref Range   Vitamin B-12 168 (L) 211 - 911 pg/mL  Hemoglobin A1c     Status: None   Collection Time: 11/05/16  8:06 AM  Result Value Ref Range   Hgb A1c MFr Bld 6.4 4.6 - 6.5 %    Comment: Glycemic Control Guidelines for People with Diabetes:Non Diabetic:  <6%Goal of Therapy: <7%Additional Action Suggested:  >8%    Vitamin D (25 hydroxy)     Status: None   Collection Time: 11/05/16  8:06 AM  Result Value Ref  Range   VITD 37.32 30.00 - 100.00 ng/mL  Basic metabolic panel     Status: Abnormal   Collection Time: 12/06/16  3:01 PM  Result Value Ref Range   Sodium 141 135 - 145 mEq/L   Potassium 3.4 (L) 3.5 - 5.1 mEq/L   Chloride 103 96 - 112 mEq/L   CO2 30 19 - 32 mEq/L   Glucose, Bld 135 (H) 70 - 99 mg/dL   BUN 18 6 - 23 mg/dL   Creatinine, Ser 1.61 0.40 - 1.20 mg/dL   Calcium 9.7 8.4 - 09.6 mg/dL   GFR 04.54 >09.81 mL/min   Assessment/Plan: Hypertension BP much improved. HR stable. Continue current regimen. Follow-up 1 month.    Piedad Climes, PA-C

## 2016-12-13 NOTE — Assessment & Plan Note (Signed)
BP much improved. HR stable. Continue current regimen. Follow-up 1 month.

## 2016-12-13 NOTE — Progress Notes (Signed)
Pre visit review using our clinic review tool, if applicable. No additional management support is needed unless otherwise documented below in the visit note. 

## 2016-12-22 ENCOUNTER — Other Ambulatory Visit: Payer: Self-pay | Admitting: Physician Assistant

## 2017-01-04 ENCOUNTER — Encounter: Payer: Self-pay | Admitting: Cardiology

## 2017-01-18 ENCOUNTER — Encounter: Payer: Self-pay | Admitting: Cardiology

## 2017-01-19 ENCOUNTER — Telehealth: Payer: Self-pay

## 2017-01-19 NOTE — Telephone Encounter (Signed)
-----   Message from Mychart, Generic sent at 01/18/2017 8:32 PM EST -----    For the last week, I have had an A-Fib episode each afternoon. It starts around 6:00pm and last for several hours.  I take my Sotalo at 7:30 am and again around 9:00pm.  When I feel the A-Fib starting I go ahead and take it immediately rather than waiting until 9:00.  I have a check up in late January but was wondering if this continues, should I be seen sooner?   This message was sent from patient. I called patient to follow up. Patient is taking sotalol 80 mg BID. Patient states she takes her first dose around 7:30AM and the second dose around 9PM, for the past week she has been taking the second dose early once she feels that she is in afib, and the symptoms go away. Pt did say that one night she took the second dose and the symptoms did not resolve so she took an additional dose which made the symptoms go away. She denies any other symptoms with these episodes.   Should I make her an appointment in the afib clinic or adjust medication?.   Thanks  Artelia Larocheena

## 2017-01-20 NOTE — Telephone Encounter (Signed)
Left message to make appt.

## 2017-01-20 NOTE — Telephone Encounter (Signed)
Yes afib clinic

## 2017-01-24 NOTE — Telephone Encounter (Signed)
Spoke with patient today. She stated she has the number to call Kennyth ArnoldStacy, RN to make appointment for the afib clinic, pt stated she will call today to make that appointment

## 2017-01-25 ENCOUNTER — Ambulatory Visit (HOSPITAL_COMMUNITY)
Admission: RE | Admit: 2017-01-25 | Discharge: 2017-01-25 | Disposition: A | Discharge status: Home / Self Care | Payer: BLUE CROSS/BLUE SHIELD | Source: Ambulatory Visit | Attending: Nurse Practitioner | Admitting: Nurse Practitioner

## 2017-01-25 ENCOUNTER — Encounter (HOSPITAL_COMMUNITY): Payer: Self-pay | Admitting: Nurse Practitioner

## 2017-01-25 VITALS — BP 124/64 | HR 57 | Ht 67.0 in | Wt 197.8 lb

## 2017-01-25 DIAGNOSIS — E119 Type 2 diabetes mellitus without complications: Secondary | ICD-10-CM | POA: Diagnosis not present

## 2017-01-25 DIAGNOSIS — F329 Major depressive disorder, single episode, unspecified: Secondary | ICD-10-CM | POA: Diagnosis not present

## 2017-01-25 DIAGNOSIS — Z881 Allergy status to other antibiotic agents status: Secondary | ICD-10-CM | POA: Diagnosis not present

## 2017-01-25 DIAGNOSIS — E781 Pure hyperglyceridemia: Secondary | ICD-10-CM | POA: Insufficient documentation

## 2017-01-25 DIAGNOSIS — F419 Anxiety disorder, unspecified: Secondary | ICD-10-CM | POA: Insufficient documentation

## 2017-01-25 DIAGNOSIS — I10 Essential (primary) hypertension: Secondary | ICD-10-CM | POA: Insufficient documentation

## 2017-01-25 DIAGNOSIS — Z823 Family history of stroke: Secondary | ICD-10-CM | POA: Insufficient documentation

## 2017-01-25 DIAGNOSIS — Z833 Family history of diabetes mellitus: Secondary | ICD-10-CM | POA: Insufficient documentation

## 2017-01-25 DIAGNOSIS — Z801 Family history of malignant neoplasm of trachea, bronchus and lung: Secondary | ICD-10-CM | POA: Insufficient documentation

## 2017-01-25 DIAGNOSIS — Z8249 Family history of ischemic heart disease and other diseases of the circulatory system: Secondary | ICD-10-CM | POA: Diagnosis not present

## 2017-01-25 DIAGNOSIS — Z79899 Other long term (current) drug therapy: Secondary | ICD-10-CM | POA: Diagnosis not present

## 2017-01-25 DIAGNOSIS — Z7984 Long term (current) use of oral hypoglycemic drugs: Secondary | ICD-10-CM | POA: Insufficient documentation

## 2017-01-25 DIAGNOSIS — Z7901 Long term (current) use of anticoagulants: Secondary | ICD-10-CM | POA: Diagnosis not present

## 2017-01-25 DIAGNOSIS — I480 Paroxysmal atrial fibrillation: Secondary | ICD-10-CM | POA: Diagnosis not present

## 2017-01-25 DIAGNOSIS — Z86010 Personal history of colonic polyps: Secondary | ICD-10-CM | POA: Insufficient documentation

## 2017-01-25 DIAGNOSIS — Z90710 Acquired absence of both cervix and uterus: Secondary | ICD-10-CM | POA: Insufficient documentation

## 2017-01-25 DIAGNOSIS — E079 Disorder of thyroid, unspecified: Secondary | ICD-10-CM | POA: Insufficient documentation

## 2017-01-25 DIAGNOSIS — Z888 Allergy status to other drugs, medicaments and biological substances status: Secondary | ICD-10-CM | POA: Insufficient documentation

## 2017-01-25 DIAGNOSIS — G4733 Obstructive sleep apnea (adult) (pediatric): Secondary | ICD-10-CM | POA: Diagnosis not present

## 2017-01-25 DIAGNOSIS — Z803 Family history of malignant neoplasm of breast: Secondary | ICD-10-CM | POA: Insufficient documentation

## 2017-01-25 DIAGNOSIS — Z9889 Other specified postprocedural states: Secondary | ICD-10-CM | POA: Diagnosis not present

## 2017-01-25 DIAGNOSIS — I481 Persistent atrial fibrillation: Secondary | ICD-10-CM | POA: Diagnosis present

## 2017-01-25 DIAGNOSIS — I2510 Atherosclerotic heart disease of native coronary artery without angina pectoris: Secondary | ICD-10-CM | POA: Insufficient documentation

## 2017-01-25 LAB — MAGNESIUM: Magnesium: 1.8 mg/dL (ref 1.7–2.4)

## 2017-01-25 LAB — BASIC METABOLIC PANEL
Anion gap: 11 (ref 5–15)
BUN: 16 mg/dL (ref 6–20)
CALCIUM: 9.6 mg/dL (ref 8.9–10.3)
CO2: 24 mmol/L (ref 22–32)
CREATININE: 0.78 mg/dL (ref 0.44–1.00)
Chloride: 106 mmol/L (ref 101–111)
GFR calc Af Amer: >60 mL/min (ref >60)
GLUCOSE: 104 mg/dL — AB (ref 65–99)
Potassium: 3.6 mmol/L (ref 3.5–5.1)
Sodium: 141 mmol/L (ref 135–145)

## 2017-01-25 LAB — TSH: TSH: 5.67 u[IU]/mL — ABNORMAL HIGH (ref 0.350–4.500)

## 2017-01-25 MED ORDER — DILTIAZEM HCL 30 MG PO TABS: ORAL_TABLET | ORAL | 1 refills | Status: DC

## 2017-01-25 NOTE — Patient Instructions (Signed)
Your physician has recommended you make the following change in your medication:  1)Cardizem 30mg -- take 1 tablet every 4 hours AS NEEDED for AFIB heart rate over 100. 

## 2017-01-25 NOTE — Progress Notes (Signed)
Primary Care Physician: Noel Journey Referring Physician: Dr. Charlene Brooke R Ashley Woodard is a 66 y.o. female with a h/o  persistent atrial fibrillation, hypertension, diabetes mellitus, hypertriglyceridemia, obstructive sleep apnea,  abnormal stress test (with no sig CAD on cath,07/2016), thyroid disease who presents  in the afib clinic, who present with episodes of afib every evening for about the last week. She was placed on sotalol, several years ago by a cardiologist in [facility], and has worked well for her until recently. Denies any new meds or change in her usual health. No alcohol or excessive caffeine. No OTC meds. + mild OSA, does not use cpap. On xarelto with a chadsvasc score of at least 4.No bleeding issues.  Today, she denies symptoms of palpitations, chest pain, shortness of breath, orthopnea, PND, lower extremity edema, dizziness, presyncope, syncope, or neurologic sequela. The patient is tolerating medications without difficulties and is otherwise without complaint today.   Past Medical History:  Diagnosis Date  . Abnormal cardiovascular stress test 08/21/2016   a. done for abnl EKG/cardiac risk factors -> admitted for cath 07/2016 which showed no significant CAD, normal LV contraction, mildly elevated filling pressure. Low dose Imdur was added for possible component of microvascular dysfunction.   Marland Kitchen Anxiety   . Arthritis   . Colon polyps   . Deafness in right ear   . Depression   . Diabetes mellitus without complication (HCC)   . Diverticulosis   . Hypertension   . Hypertriglyceridemia   . Migraine   . Persistent atrial fibrillation (HCC)   . S/P cardiac catheterization, 08/20/16 minimal CAD  08/21/2016  . Sleep apnea   . Thyroid disease    Past Surgical History:  Procedure Laterality Date  . ABDOMINAL HYSTERECTOMY  1988  . COCHLEAR IMPLANT  2008  . LEFT HEART CATH AND CORONARY ANGIOGRAPHY N/A 08/20/2016   Procedure: Left Heart Cath and Coronary Angiography;   Surgeon: Yvonne Kendall, MD;  Location: [facility] INVASIVE CV LAB;  Service: Cardiovascular;  Laterality: N/A;    Current Outpatient Medications  Medication Sig Dispense Refill  . alendronate (FOSAMAX) 70 MG tablet Take 1 tablet (70 mg total) by mouth every 7 (seven) days. Take with a full glass of water on an empty stomach. 4 tablet 11  . amLODipine (NORVASC) 10 MG tablet Take 1 tablet (10 mg total) by mouth daily. 30 tablet 2  . Cholecalciferol (VITAMIN D PO) Take by mouth.    . DULoxetine (CYMBALTA) 60 MG capsule Take 1 capsule by mouth daily. Take a sleep aide for sleep. She does not know the name  4  . glucose blood (CONTOUR NEXT TEST) test strip Check blood sugars daily 100 each 6  . hydrALAZINE (APRESOLINE) 50 MG tablet Take 1 tablet by mouth 2 (two) times daily.  7  . hydrochlorothiazide (HYDRODIURIL) 25 MG tablet Take 1 tablet (25 mg total) by mouth daily. 90 tablet 1  . irbesartan (AVAPRO) 150 MG tablet Take 1 tablet (150 mg total) by mouth daily. 90 tablet 1  . LANCETS ULTRA THIN 30G MISC 1 Stick by Does not apply route daily. 100 each 6  . levothyroxine (SYNTHROID, LEVOTHROID) 75 MCG tablet Take 1 tablet by mouth daily.  2  . metFORMIN (GLUCOPHAGE) 500 MG tablet Take 1 tablet (500 mg total) by mouth 2 (two) times daily with a meal.  5  . Omega-3 Fatty Acids (FISH OIL) 1000 MG CAPS Take 1 capsule by mouth daily.    Marland Kitchen  potassium chloride SA (K-DUR,KLOR-CON) 20 MEQ tablet TAKE 1 TABLET BY MOUTH TWICE DAILY 180 tablet 1  . simvastatin (ZOCOR) 20 MG tablet Take 1 tablet (20 mg total) by mouth 3 (three) times a week. 90 tablet 1  . sotalol (BETAPACE) 80 MG tablet Take 1 tablet by mouth 2 (two) times daily.  4  . Tetrahydrozoline HCl (EYE DROPS OP) Apply 2 drops to eye as needed (Dry eyes).    . vitamin B-12 (CYANOCOBALAMIN) 1000 MCG tablet Take 1 tablet (1,000 mcg total) by mouth daily. 30 tablet 0  . XARELTO 20 MG TABS tablet Take 1 tablet by mouth at bedtime.   5  . diltiazem (CARDIZEM) 30  MG tablet Take 1 tablet every 4 hours AS NEEDED for AFIB heart rate over 100 45 tablet 1   No current facility-administered medications for this encounter.     Allergies  Allergen Reactions  . Demeclocycline Rash  . Doxycycline Rash  . Erythromycin Rash  . Lisinopril Cough  . Tetracyclines & Related Rash    Social History   Socioeconomic History  . Marital status: Married    Spouse name: Not on file  . Number of children: Not on file  . Years of education: 8112  . Highest education level: Not on file  Social Needs  . Financial resource strain: Not on file  . Food insecurity - worry: Not on file  . Food insecurity - inability: Not on file  . Transportation needs - medical: Not on file  . Transportation needs - non-medical: Not on file  Occupational History  . Occupation: Art therapistGeneral Manager  Tobacco Use  . Smoking status: Never Smoker  . Smokeless tobacco: Never Used  Substance and Sexual Activity  . Alcohol use: No  . Drug use: No  . Sexual activity: Yes  Other Topics Concern  . Not on file  Social History Narrative  . Not on file    Family History  Problem Relation Age of Onset  . Hypertension Mother   . Diabetes Mother   . Heart attack Mother   . Hypertension Father   . Heart attack Father   . Stroke Father   . Cancer Sister        Breast  . Hypertension Brother   . Cancer Brother        Prostate  . Diabetes Brother   . Dementia Brother   . Stroke Brother   . Heart attack Brother   . Hypertension Son   . Cancer Maternal Aunt        Breast  . Cancer Maternal Uncle        Lung    ROS- All systems are reviewed and negative except as per the HPI above  Physical Exam: Vitals:   01/25/17 1151  BP: 124/64  Pulse: (!) 57  Weight: 197 lb 12.8 oz (89.7 kg)  Height: 5\' 7"  (1.702 m)   Wt Readings from Last 3 Encounters:  01/25/17 197 lb 12.8 oz (89.7 kg)  12/13/16 203 lb (92.1 kg)  12/06/16 203 lb (92.1 kg)    Labs: Lab Results  Component Value  Date   NA 141 12/06/2016   K 3.4 (L) 12/06/2016   CL 103 12/06/2016   CO2 30 12/06/2016   GLUCOSE 135 (H) 12/06/2016   BUN 18 12/06/2016   CREATININE 0.86 12/06/2016   CALCIUM 9.7 12/06/2016   Lab Results  Component Value Date   INR 1.17 08/20/2016   Lab Results  Component Value Date  CHOL 129 04/12/2016   HDL 37 04/12/2016   LDLCALC 49 04/12/2016   TRIG 429 (A) 04/12/2016     GEN- The patient is well appearing, alert and oriented x 3 today.   Head- normocephalic, atraumatic Eyes-  Sclera clear, conjunctiva pink Ears- hearing intact Oropharynx- clear Neck- supple, no JVP Lymph- no cervical lymphadenopathy Lungs- Clear to ausculation bilaterally, normal work of breathing Heart- Regular rate and rhythm, no murmurs, rubs or gallops, PMI not laterally displaced GI- soft, NT, ND, + BS Extremities- no clubbing, cyanosis, or edema MS- no significant deformity or atrophy Skin- no rash or lesion Psych- euthymic mood, full affect Neuro- strength and sensation are intact  EKG-Sinus brady at 57 bpm, pr int 178 ms, qrs int 92 ms, qtc 480 ms Epic records reviewed Echo-Study Conclusions  - Left ventricle: The cavity size was normal. Wall thickness was   normal. Systolic function was normal. The estimated ejection   fraction was in the range of 60% to 65%. Doppler parameters are   consistent with high ventricular filling pressure. - Aortic valve: There was trivial regurgitation. - Left atrium: The atrium was mildly dilated. - Pericardium, extracardiac: A trivial pericardial effusion was   identified.  Cardiac cath-Conclusions: 1. No angiographically significant coronary artery disease. 2. Normal left ventricular contraction. 3. Mildly elevated left ventricular filling pressure.   Assessment and Plan: 1. afib Has been on sotalol 80 mg bid for years and has done well until the last week with pm breakthrough Will check bmet for K+ levels as well as mag as pt is on HCTZ  and electrolyte imbalance may be contributing, k+ low one month ago Will also check TSH for possible thyroid issues  Will Rx 30 mg Cardizem to use as needed afib episodes, but was told not to take extra  Sotalol as it could greatly impact qtc. Discussed options to maintain SR if no electrolyte of thyroid abnormalities Tikosyn or ablation discussed, pt is not very interested in either one at this point May be able to increase sotalol to 120 mg bid as outpt if in SR when increased and  with very close f/u in 48 hours, will discuss with Dr. Johney FrameAllred  F/u in 2 weeks  Ashley Woodard, ANP-C [facility] [address] [place], [place] [address] [phone]

## 2017-01-25 NOTE — Addendum Note (Signed)
Encounter addended by: Newman Niparroll, Malaky Tetrault C, NP on: 01/25/2017 2:03 PM  Actions taken: Result note filed, Results reviewed in IB

## 2017-01-28 ENCOUNTER — Encounter: Payer: Self-pay | Admitting: Physician Assistant

## 2017-02-04 ENCOUNTER — Ambulatory Visit (HOSPITAL_COMMUNITY)
Admission: RE | Admit: 2017-02-04 | Discharge: 2017-02-04 | Disposition: A | Discharge status: Home / Self Care | Payer: BLUE CROSS/BLUE SHIELD | Source: Ambulatory Visit | Attending: Nurse Practitioner | Admitting: Nurse Practitioner

## 2017-02-04 ENCOUNTER — Encounter (HOSPITAL_COMMUNITY): Payer: Self-pay | Admitting: Nurse Practitioner

## 2017-02-04 VITALS — BP 130/72 | HR 63 | Ht 67.0 in | Wt 196.0 lb

## 2017-02-04 DIAGNOSIS — E781 Pure hyperglyceridemia: Secondary | ICD-10-CM | POA: Diagnosis not present

## 2017-02-04 DIAGNOSIS — I4891 Unspecified atrial fibrillation: Secondary | ICD-10-CM | POA: Diagnosis present

## 2017-02-04 DIAGNOSIS — E079 Disorder of thyroid, unspecified: Secondary | ICD-10-CM | POA: Insufficient documentation

## 2017-02-04 DIAGNOSIS — I481 Persistent atrial fibrillation: Secondary | ICD-10-CM | POA: Insufficient documentation

## 2017-02-04 DIAGNOSIS — I2510 Atherosclerotic heart disease of native coronary artery without angina pectoris: Secondary | ICD-10-CM | POA: Insufficient documentation

## 2017-02-04 DIAGNOSIS — F329 Major depressive disorder, single episode, unspecified: Secondary | ICD-10-CM | POA: Diagnosis not present

## 2017-02-04 DIAGNOSIS — Z86010 Personal history of colonic polyps: Secondary | ICD-10-CM | POA: Insufficient documentation

## 2017-02-04 DIAGNOSIS — I10 Essential (primary) hypertension: Secondary | ICD-10-CM | POA: Diagnosis not present

## 2017-02-04 DIAGNOSIS — Z7984 Long term (current) use of oral hypoglycemic drugs: Secondary | ICD-10-CM | POA: Diagnosis not present

## 2017-02-04 DIAGNOSIS — R9431 Abnormal electrocardiogram [ECG] [EKG]: Secondary | ICD-10-CM | POA: Diagnosis not present

## 2017-02-04 DIAGNOSIS — F419 Anxiety disorder, unspecified: Secondary | ICD-10-CM | POA: Diagnosis not present

## 2017-02-04 DIAGNOSIS — Z79899 Other long term (current) drug therapy: Secondary | ICD-10-CM | POA: Insufficient documentation

## 2017-02-04 DIAGNOSIS — I480 Paroxysmal atrial fibrillation: Secondary | ICD-10-CM

## 2017-02-04 DIAGNOSIS — G4733 Obstructive sleep apnea (adult) (pediatric): Secondary | ICD-10-CM | POA: Insufficient documentation

## 2017-02-04 DIAGNOSIS — Z7901 Long term (current) use of anticoagulants: Secondary | ICD-10-CM | POA: Diagnosis not present

## 2017-02-04 DIAGNOSIS — E119 Type 2 diabetes mellitus without complications: Secondary | ICD-10-CM | POA: Insufficient documentation

## 2017-02-04 LAB — BASIC METABOLIC PANEL
Anion gap: 8 (ref 5–15)
BUN: 16 mg/dL (ref 6–20)
CALCIUM: 9.4 mg/dL (ref 8.9–10.3)
CO2: 24 mmol/L (ref 22–32)
CREATININE: 0.9 mg/dL (ref 0.44–1.00)
Chloride: 106 mmol/L (ref 101–111)
GFR calc non Af Amer: >60 mL/min (ref >60)
Glucose, Bld: 126 mg/dL — ABNORMAL HIGH (ref 65–99)
Potassium: 4.3 mmol/L (ref 3.5–5.1)
SODIUM: 138 mmol/L (ref 135–145)

## 2017-02-04 LAB — MAGNESIUM: MAGNESIUM: 2 mg/dL (ref 1.7–2.4)

## 2017-02-04 NOTE — Progress Notes (Signed)
Primary Care Physician: Noel JourneyMartin, William C, PA-C Referring Physician: Dr. Charlene Brookeurner   Ashley Woodard is a 66 y.o. female with a h/o  persistent atrial fibrillation, hypertension, diabetes mellitus, hypertriglyceridemia, obstructive sleep apnea,  abnormal stress test (with no sig CAD on cath,07/2016), thyroid disease who presents  in the afib clinic, who present with episodes of afib every evening for about the last week. She was placed on sotalol, several years ago by a cardiologist in [facility], and has worked well for her until recently. Denies any new meds or change in her usual health. No alcohol or excessive caffeine. No OTC meds. + mild OSA, does not use cpap. On xarelto with a chadsvasc score of at least 4.No bleeding issues. She was told in the past that she could take an extra 80 mg sotalol when in afib and she has done that recently for several doses.  F/u in afib clinic 12/14. She has had less afib this week but is still having breakthrough episodes. QTc looks better to possible increase sotalol on a outpatient basis at 448 ms vrs 480 ms last week. Bmet and mag last week with slightly low K+/mag levels for sotalol and started on supplements. TSH was also found to slightly elevated and PCP is following.  Today, she denies symptoms of palpitations, chest pain, shortness of breath, orthopnea, PND, lower extremity edema, dizziness, presyncope, syncope, or neurologic sequela. The patient is tolerating medications without difficulties and is otherwise without complaint today.   Past Medical History:  Diagnosis Date  . Abnormal cardiovascular stress test 08/21/2016   a. done for abnl EKG/cardiac risk factors -> admitted for cath 07/2016 which showed no significant CAD, normal LV contraction, mildly elevated filling pressure. Low dose Imdur was added for possible component of microvascular dysfunction.   Marland Kitchen. Anxiety   . Arthritis   . Colon polyps   . Deafness in right ear   . Depression   . Diabetes  mellitus without complication (HCC)   . Diverticulosis   . Hypertension   . Hypertriglyceridemia   . Migraine   . Persistent atrial fibrillation (HCC)   . S/P cardiac catheterization, 08/20/16 minimal CAD  08/21/2016  . Sleep apnea   . Thyroid disease    Past Surgical History:  Procedure Laterality Date  . ABDOMINAL HYSTERECTOMY  1988  . COCHLEAR IMPLANT  2008  . LEFT HEART CATH AND CORONARY ANGIOGRAPHY N/A 08/20/2016   Procedure: Left Heart Cath and Coronary Angiography;  Surgeon: Yvonne KendallEnd, Christopher, MD;  Location: [facility];  Service: Cardiovascular;  Laterality: N/A;    Current Outpatient Medications  Medication Sig Dispense Refill  . alendronate (FOSAMAX) 70 MG tablet Take 1 tablet (70 mg total) by mouth every 7 (seven) days. Take with a full glass of water on an empty stomach. 4 tablet 11  . amLODipine (NORVASC) 10 MG tablet Take 1 tablet (10 mg total) by mouth daily. 30 tablet 2  . Cholecalciferol (VITAMIN D PO) Take by mouth.    . diltiazem (CARDIZEM) 30 MG tablet Take 1 tablet every 4 hours AS NEEDED for AFIB heart rate over 100 45 tablet 1  . DULoxetine (CYMBALTA) 60 MG capsule Take 1 capsule by mouth daily. Take a sleep aide for sleep. She does not know the name  4  . glucose blood (CONTOUR NEXT TEST) test strip Check blood sugars daily 100 each 6  . hydrALAZINE (APRESOLINE) 50 MG tablet Take 1 tablet by mouth 2 (two) times daily.  7  .  irbesartan (AVAPRO) 150 MG tablet Take 1 tablet (150 mg total) by mouth daily. 90 tablet 1  . LANCETS ULTRA THIN 30G MISC 1 Stick by Does not apply route daily. 100 each 6  . levothyroxine (SYNTHROID, LEVOTHROID) 75 MCG tablet Take 1 tablet by mouth daily.  2  . Magnesium 200 MG TABS Take 1 tablet by mouth daily.    . metFORMIN (GLUCOPHAGE) 500 MG tablet Take 1 tablet (500 mg total) by mouth 2 (two) times daily with a meal.  5  . Omega-3 Fatty Acids (FISH OIL) 1000 MG CAPS Take 1 capsule by mouth daily.    . potassium chloride SA  (K-DUR,KLOR-CON) 20 MEQ tablet TAKE 1 TABLET BY MOUTH TWICE DAILY (Patient taking differently: take 2 tablets in the morning and 1 tablet in the afternoon) 180 tablet 1  . simvastatin (ZOCOR) 20 MG tablet Take 1 tablet (20 mg total) by mouth 3 (three) times a week. 90 tablet 1  . sotalol (BETAPACE) 80 MG tablet Take 1 tablet by mouth 2 (two) times daily.  4  . Tetrahydrozoline HCl (EYE DROPS OP) Apply 2 drops to eye as needed (Dry eyes).    . vitamin B-12 (CYANOCOBALAMIN) 1000 MCG tablet Take 1 tablet (1,000 mcg total) by mouth daily. 30 tablet 0  . XARELTO 20 MG TABS tablet Take 1 tablet by mouth at bedtime.   5  . hydrochlorothiazide (HYDRODIURIL) 25 MG tablet Take 1 tablet (25 mg total) by mouth daily. 90 tablet 1   No current facility-administered medications for this encounter.     Allergies  Allergen Reactions  . Demeclocycline Rash  . Doxycycline Rash  . Erythromycin Rash  . Lisinopril Cough  . Tetracyclines & Related Rash    Social History   Socioeconomic History  . Marital status: Married    Spouse name: Not on file  . Number of children: Not on file  . Years of education: 1412  . Highest education level: Not on file  Social Needs  . Financial resource strain: Not on file  . Food insecurity - worry: Not on file  . Food insecurity - inability: Not on file  . Transportation needs - medical: Not on file  . Transportation needs - non-medical: Not on file  Occupational History  . Occupation: Art therapistGeneral Manager  Tobacco Use  . Smoking status: Never Smoker  . Smokeless tobacco: Never Used  Substance and Sexual Activity  . Alcohol use: No  . Drug use: No  . Sexual activity: Yes  Other Topics Concern  . Not on file  Social History Narrative  . Not on file    Family History  Problem Relation Age of Onset  . Hypertension Mother   . Diabetes Mother   . Heart attack Mother   . Hypertension Father   . Heart attack Father   . Stroke Father   . Cancer Sister         Breast  . Hypertension Brother   . Cancer Brother        Prostate  . Diabetes Brother   . Dementia Brother   . Stroke Brother   . Heart attack Brother   . Hypertension Son   . Cancer Maternal Aunt        Breast  . Cancer Maternal Uncle        Lung    ROS- All systems are reviewed and negative except as per the HPI above  Physical Exam: Vitals:   02/04/17 0833  BP: 130/72  Pulse: 63  Weight: 196 lb (88.9 kg)  Height: 5\' 7"  (1.702 m)   Wt Readings from Last 3 Encounters:  02/04/17 196 lb (88.9 kg)  01/25/17 197 lb 12.8 oz (89.7 kg)  12/13/16 203 lb (92.1 kg)    Labs: Lab Results  Component Value Date   NA 141 01/25/2017   K 3.6 01/25/2017   CL 106 01/25/2017   CO2 24 01/25/2017   GLUCOSE 104 (H) 01/25/2017   BUN 16 01/25/2017   CREATININE 0.78 01/25/2017   CALCIUM 9.6 01/25/2017   MG 1.8 01/25/2017   Lab Results  Component Value Date   INR 1.17 08/20/2016   Lab Results  Component Value Date   CHOL 129 04/12/2016   HDL 37 04/12/2016   LDLCALC 49 04/12/2016   TRIG 429 (A) 04/12/2016     GEN- The patient is well appearing, alert and oriented x 3 today.   Head- normocephalic, atraumatic Eyes-  Sclera clear, conjunctiva pink Ears- hearing intact Oropharynx- clear Neck- supple, no JVP Lymph- no cervical lymphadenopathy Lungs- Clear to ausculation bilaterally, normal work of breathing Heart- Regular rate and rhythm, no murmurs, rubs or gallops, PMI not laterally displaced GI- soft, NT, ND, + BS Extremities- no clubbing, cyanosis, or edema MS- no significant deformity or atrophy Skin- no rash or lesion Psych- euthymic mood, full affect Neuro- strength and sensation are intact  EKG- Normal sinus rhythm at 63 bpm, PR int 178 ms, qrs int 84 ms, qtc 448 ms Epic records reviewed Echo-Study Conclusions  - Left ventricle: The cavity size was normal. Wall thickness was   normal. Systolic function was normal. The estimated ejection   fraction was in the  range of 60% to 65%. Doppler parameters are   consistent with high ventricular filling pressure. - Aortic valve: There was trivial regurgitation. - Left atrium: The atrium was mildly dilated. - Pericardium, extracardiac: A trivial pericardial effusion was   identified.  Cardiac cath-Conclusions: 1. No angiographically significant coronary artery disease. 2. Normal left ventricular contraction. 3. Mildly elevated left ventricular filling pressure.   Assessment and Plan: 1. afib Has been on sotalol 80 mg bid for years and has done well until the last week with pm breakthrough Better this week but episodes persisit Will Rx 30 mg Cardizem to use as needed afib episodes, but was told not to take extra  Sotalol as it could greatly impact qtc. Tikosyn or ablation discussed, pt is not very interested in either one at this point May be able to increase sotalol to 120 mg bid as outpt if in SR when increased and if labs acceptable today with K+/mag levels  with very close f/u after 3 doses to check qtc  I will be in contact with pt the first of the week after labs reviewed to discuss increase of sotalol  Lupita Leash C. [facility] [address] [place], [place] [address] [phone]

## 2017-02-10 ENCOUNTER — Ambulatory Visit (HOSPITAL_COMMUNITY)
Admission: RE | Admit: 2017-02-10 | Discharge: 2017-02-10 | Disposition: A | Discharge status: Home / Self Care | Payer: BLUE CROSS/BLUE SHIELD | Source: Ambulatory Visit | Attending: Nurse Practitioner | Admitting: Nurse Practitioner

## 2017-02-10 DIAGNOSIS — R001 Bradycardia, unspecified: Secondary | ICD-10-CM | POA: Insufficient documentation

## 2017-02-10 DIAGNOSIS — I4891 Unspecified atrial fibrillation: Secondary | ICD-10-CM | POA: Diagnosis present

## 2017-02-10 NOTE — Progress Notes (Addendum)
Pt in for repeat EKG.  To be reviewed by Rudi Cocoonna Audreana Hancox, NP  Pt was having increased afib burden. Decision was made to increase sotalol on an outpt basis after reviewing EKG with Dr. Johney FrameAllred and awareness of Dr. Mayford Knifeurner. Renal function and qt good for increase in drug. She returns to afib clinic after 3 doses and EKG shows sinus brady at 54 bpm with pr int 178 ms, qrs int 88 ms, qtc 445 ms, intervals unchanged with 120 mg bid. She is slightly slower in the 50's but is not symptomatic and is not on rate control I can decrease. She will come back in one week for EKG review. She feels unchanged on sotalol dose.

## 2017-02-11 ENCOUNTER — Other Ambulatory Visit: Payer: Self-pay | Admitting: Physician Assistant

## 2017-02-17 ENCOUNTER — Ambulatory Visit (HOSPITAL_COMMUNITY)
Admission: RE | Admit: 2017-02-17 | Discharge: 2017-02-17 | Disposition: A | Discharge status: Home / Self Care | Payer: BLUE CROSS/BLUE SHIELD | Source: Ambulatory Visit | Attending: Nurse Practitioner | Admitting: Nurse Practitioner

## 2017-02-17 DIAGNOSIS — I4891 Unspecified atrial fibrillation: Secondary | ICD-10-CM | POA: Diagnosis not present

## 2017-02-17 NOTE — Progress Notes (Addendum)
Pt in for EKG to be reviewed by Rudi Cocoonna Tiona Ruane, NP  Pt had increase in Sotalol 80 mg bid to 120  mg bid and is back in  clinic for repeat EKG after one week. Intervals are still OK but she is still having increase in afib burden. Continue sotalol for now but will get appointment with Dr. Johney FrameAllred  to discuss ablation vrs antiarrythmic change. Given info on tikosyn to call and inquire re cost.

## 2017-02-21 ENCOUNTER — Ambulatory Visit: Payer: Self-pay | Admitting: Physician Assistant

## 2017-02-24 ENCOUNTER — Ambulatory Visit (INDEPENDENT_AMBULATORY_CARE_PROVIDER_SITE_OTHER): Payer: BLUE CROSS/BLUE SHIELD | Admitting: Physician Assistant

## 2017-02-24 ENCOUNTER — Encounter: Payer: Self-pay | Admitting: Physician Assistant

## 2017-02-24 ENCOUNTER — Other Ambulatory Visit (INDEPENDENT_AMBULATORY_CARE_PROVIDER_SITE_OTHER): Payer: BLUE CROSS/BLUE SHIELD

## 2017-02-24 VITALS — BP 118/70 | HR 51 | Temp 97.8°F | Resp 14 | Ht 67.0 in | Wt 195.0 lb

## 2017-02-24 DIAGNOSIS — R7309 Other abnormal glucose: Secondary | ICD-10-CM

## 2017-02-24 DIAGNOSIS — R102 Pelvic and perineal pain: Secondary | ICD-10-CM | POA: Diagnosis not present

## 2017-02-24 DIAGNOSIS — I10 Essential (primary) hypertension: Secondary | ICD-10-CM | POA: Diagnosis not present

## 2017-02-24 DIAGNOSIS — E039 Hypothyroidism, unspecified: Secondary | ICD-10-CM | POA: Diagnosis not present

## 2017-02-24 LAB — POCT URINALYSIS DIPSTICK
Bilirubin, UA: NEGATIVE
Blood, UA: NEGATIVE
GLUCOSE UA: NEGATIVE
Ketones, UA: NEGATIVE
LEUKOCYTES UA: NEGATIVE
NITRITE UA: NEGATIVE
PROTEIN UA: NEGATIVE
SPEC GRAV UA: 1.02 (ref 1.010–1.025)
Urobilinogen, UA: 0.2 E.U./dL
pH, UA: 6 (ref 5.0–8.0)

## 2017-02-24 LAB — COMPREHENSIVE METABOLIC PANEL
ALK PHOS: 67 U/L (ref 39–117)
ALT: 14 U/L (ref 0–35)
AST: 10 U/L (ref 0–37)
Albumin: 4.2 g/dL (ref 3.5–5.2)
BUN: 22 mg/dL (ref 6–23)
CHLORIDE: 104 meq/L (ref 96–112)
CO2: 30 mEq/L (ref 19–32)
CREATININE: 0.93 mg/dL (ref 0.40–1.20)
Calcium: 9.6 mg/dL (ref 8.4–10.5)
GFR: 64.15 mL/min (ref >60.00)
GLUCOSE: 116 mg/dL — AB (ref 70–99)
POTASSIUM: 4.4 meq/L (ref 3.5–5.1)
SODIUM: 141 meq/L (ref 135–145)
TOTAL PROTEIN: 6.4 g/dL (ref 6.0–8.3)
Total Bilirubin: 0.6 mg/dL (ref 0.2–1.2)

## 2017-02-24 LAB — MAGNESIUM: Magnesium: 2.2 mg/dL (ref 1.5–2.5)

## 2017-02-24 LAB — TSH: TSH: 2.02 u[IU]/mL (ref 0.35–4.50)

## 2017-02-24 LAB — HEMOGLOBIN A1C: Hgb A1c MFr Bld: 6.4 % (ref 4.6–6.5)

## 2017-02-24 NOTE — Patient Instructions (Signed)
Please go to the lab today for blood work.  I will call you with your results. We will alter treatment regimen(s) if indicated by your results.   Continue your medications as directed. Stop by the front desk to speak with Marylene LandAngela or Lanora Manislizabeth to schedule your US.  I will call you with your results.

## 2017-02-24 NOTE — Progress Notes (Signed)
Patient presents to clinic today for follow-up of hypertension and hypothyroidism. Patient also with acute concerns today.  Hypertension -- Taking all medications as directed. Currently on a regimen of amlodipine, irbesartan, sotalol and hydralazine. Denies side effect of medication. Patient denies chest pain, palpitations, lightheadedness, dizziness, vision changes or frequent headaches. Patient following closely with Cardiology for her atrial fibrillation. Is being considered for ablation.  Hypothyroidism -- TSH elevated recently on check with Cardiology. Patient was not taking her levothyroxine as directed. Has sent restarted, taking every day as directed. Is due for repeat TSH today.   Patient also endorses around 3 months of R pelvic pain occurring only at night when laying down. Does not happen every night but occurs frequently. Is a aching pain and sometimes sharp. Does not radiate. Denies any nausea or vomiting. Denies vaginal pain, itching, discharge or dyspareunia. Bowels are regular. Denies change to urinary habits.  Past Medical History:  Diagnosis Date  . Abnormal cardiovascular stress test 08/21/2016   a. done for abnl EKG/cardiac risk factors -> admitted for cath 07/2016 which showed no significant CAD, normal LV contraction, mildly elevated filling pressure. Low dose Imdur was added for possible component of microvascular dysfunction.   Marland Kitchen Anxiety   . Arthritis   . Colon polyps   . Deafness in right ear   . Depression   . Diabetes mellitus without complication ([place])   . Diverticulosis   . Hypertension   . Hypertriglyceridemia   . Migraine   . Persistent atrial fibrillation ([place])   . S/P cardiac catheterization, 08/20/16 minimal CAD  08/21/2016  . Sleep apnea   . Thyroid disease     Current Outpatient Medications on File Prior to Visit  Medication Sig Dispense Refill  . amLODipine (NORVASC) 10 MG tablet TAKE 1 TABLET(10 MG) BY MOUTH DAILY 30 tablet 6  . Cholecalciferol  (VITAMIN D PO) Take by mouth.    . diltiazem (CARDIZEM) 30 MG tablet Take 1 tablet every 4 hours AS NEEDED for AFIB heart rate over 100 45 tablet 1  . DULoxetine (CYMBALTA) 60 MG capsule Take 1 capsule by mouth daily. Take a sleep aide for sleep. She does not know the name  4  . glucose blood (CONTOUR NEXT TEST) test strip Check blood sugars daily 100 each 6  . hydrALAZINE (APRESOLINE) 50 MG tablet Take 1 tablet by mouth 2 (two) times daily.  7  . irbesartan (AVAPRO) 150 MG tablet Take 1 tablet (150 mg total) by mouth daily. 90 tablet 1  . LANCETS ULTRA THIN 30G MISC 1 Stick by Does not apply route daily. 100 each 6  . levothyroxine (SYNTHROID, LEVOTHROID) 75 MCG tablet Take 1 tablet by mouth daily.  2  . Magnesium 200 MG TABS Take 1 tablet by mouth daily.    . metFORMIN (GLUCOPHAGE) 500 MG tablet Take 1 tablet (500 mg total) by mouth 2 (two) times daily with a meal.  5  . Omega-3 Fatty Acids (FISH OIL) 1000 MG CAPS Take 1 capsule by mouth daily.    . potassium chloride SA (K-DUR,KLOR-CON) 20 MEQ tablet TAKE 1 TABLET BY MOUTH TWICE DAILY (Patient taking differently: take 2 tablets in the morning and 1 tablet in the afternoon) 180 tablet 1  . simvastatin (ZOCOR) 20 MG tablet Take 1 tablet (20 mg total) by mouth 3 (three) times a week. 90 tablet 1  . sotalol (BETAPACE) 80 MG tablet Take 1.5 tablets by mouth 2 (two) times daily.   4  .  Tetrahydrozoline HCl (EYE DROPS OP) Apply 2 drops to eye as needed (Dry eyes).    . vitamin B-12 (CYANOCOBALAMIN) 1000 MCG tablet Take 1 tablet (1,000 mcg total) by mouth daily. 30 tablet 0  . XARELTO 20 MG TABS tablet Take 1 tablet by mouth at bedtime.   5  . alendronate (FOSAMAX) 70 MG tablet Take 1 tablet (70 mg total) by mouth every 7 (seven) days. Take with a full glass of water on an empty stomach. (Patient not taking: Reported on 02/24/2017) 4 tablet 11  . hydrochlorothiazide (HYDRODIURIL) 25 MG tablet Take 1 tablet (25 mg total) by mouth daily. 90 tablet 1   No  current facility-administered medications on file prior to visit.     Allergies  Allergen Reactions  . Demeclocycline Rash  . Doxycycline Rash  . Erythromycin Rash  . Lisinopril Cough  . Tetracyclines & Related Rash    Family History  Problem Relation Age of Onset  . Hypertension Mother   . Diabetes Mother   . Heart attack Mother   . Hypertension Father   . Heart attack Father   . Stroke Father   . Cancer Sister        Breast  . Hypertension Brother   . Cancer Brother        Prostate  . Diabetes Brother   . Dementia Brother   . Stroke Brother   . Heart attack Brother   . Hypertension Son   . Cancer Maternal Aunt        Breast  . Cancer Maternal Uncle        Lung    Social History   Socioeconomic History  . Marital status: Married    Spouse name: None  . Number of children: None  . Years of education: 43  . Highest education level: None  Social Needs  . Financial resource strain: None  . Food insecurity - worry: None  . Food insecurity - inability: None  . Transportation needs - medical: None  . Transportation needs - non-medical: None  Occupational History  . Occupation: Health and safety inspector  Tobacco Use  . Smoking status: Never Smoker  . Smokeless tobacco: Never Used  Substance and Sexual Activity  . Alcohol use: No  . Drug use: No  . Sexual activity: Yes  Other Topics Concern  . None  Social History Narrative  . None   Review of Systems - See HPI.  All other ROS are negative.  BP 118/70   Pulse (!) 51   Temp 97.8 F (36.6 C) (Oral)   Resp 14   Ht '5\' 7"'  (1.702 m)   Wt 195 lb (88.5 kg)   SpO2 97%   BMI 30.54 kg/m   Physical Exam  Constitutional: She is oriented to person, place, and time and well-developed, well-nourished, and in no distress.  HENT:  Head: Normocephalic and atraumatic.  Eyes: Conjunctivae are normal.  Neck: Neck supple.  Cardiovascular: Normal rate, regular rhythm, normal heart sounds and intact distal pulses.    Pulmonary/Chest: Effort normal and breath sounds normal. No respiratory distress. She has no wheezes. She has no rales. She exhibits no tenderness.  Abdominal: Soft. Bowel sounds are normal. She exhibits no distension and no mass. There is no tenderness. There is no rebound and no guarding.  Neurological: She is alert and oriented to person, place, and time.  Skin: Skin is warm and dry. No rash noted.  Psychiatric: Affect normal.  Vitals reviewed.  Recent Results (from the  past 2160 hour(s))  Basic metabolic panel     Status: Abnormal   Collection Time: 12/06/16  3:01 PM  Result Value Ref Range   Sodium 141 135 - 145 mEq/L   Potassium 3.4 (L) 3.5 - 5.1 mEq/L   Chloride 103 96 - 112 mEq/L   CO2 30 19 - 32 mEq/L   Glucose, Bld 135 (H) 70 - 99 mg/dL   BUN 18 6 - 23 mg/dL   Creatinine, Ser 0.86 0.40 - 1.20 mg/dL   Calcium 9.7 8.4 - 10.5 mg/dL   GFR 70.26 >60.00 mL/min  Basic metabolic panel     Status: Abnormal   Collection Time: 01/25/17 12:00 PM  Result Value Ref Range   Sodium 141 135 - 145 mmol/L   Potassium 3.6 3.5 - 5.1 mmol/L   Chloride 106 101 - 111 mmol/L   CO2 24 22 - 32 mmol/L   Glucose, Bld 104 (H) 65 - 99 mg/dL   BUN 16 6 - 20 mg/dL   Creatinine, Ser 0.78 0.44 - 1.00 mg/dL   Calcium 9.6 8.9 - 10.3 mg/dL   GFR calc non Af Amer >60 >60 mL/min   GFR calc Af Amer >60 >60 mL/min    Comment: (NOTE) The eGFR has been calculated using the CKD EPI equation. This calculation has not been validated in all clinical situations. eGFR's persistently <60 mL/min signify possible Chronic Kidney Disease.    Anion gap 11 5 - 15  Magnesium     Status: None   Collection Time: 01/25/17 12:00 PM  Result Value Ref Range   Magnesium 1.8 1.7 - 2.4 mg/dL  TSH     Status: Abnormal   Collection Time: 01/25/17 12:00 PM  Result Value Ref Range   TSH 5.670 (H) 0.350 - 4.500 uIU/mL    Comment: Performed by a 3rd Generation assay with a functional sensitivity of <=0.01 uIU/mL.  Basic  metabolic panel     Status: Abnormal   Collection Time: 02/04/17  8:20 AM  Result Value Ref Range   Sodium 138 135 - 145 mmol/L   Potassium 4.3 3.5 - 5.1 mmol/L   Chloride 106 101 - 111 mmol/L   CO2 24 22 - 32 mmol/L   Glucose, Bld 126 (H) 65 - 99 mg/dL   BUN 16 6 - 20 mg/dL   Creatinine, Ser 0.90 0.44 - 1.00 mg/dL   Calcium 9.4 8.9 - 10.3 mg/dL   GFR calc non Af Amer >60 >60 mL/min   GFR calc Af Amer >60 >60 mL/min    Comment: (NOTE) The eGFR has been calculated using the CKD EPI equation. This calculation has not been validated in all clinical situations. eGFR's persistently <60 mL/min signify possible Chronic Kidney Disease.    Anion gap 8 5 - 15  Magnesium     Status: None   Collection Time: 02/04/17  8:20 AM  Result Value Ref Range   Magnesium 2.0 1.7 - 2.4 mg/dL  POCT Urinalysis Dipstick     Status: Normal   Collection Time: 02/24/17  8:54 AM  Result Value Ref Range   Color, UA amber    Clarity, UA clear    Glucose, UA negative    Bilirubin, UA negative    Ketones, UA negative    Spec Grav, UA 1.020 1.010 - 1.025   Blood, UA negative    pH, UA 6.0 5.0 - 8.0   Protein, UA negative    Urobilinogen, UA 0.2 0.2 or 1.0 E.U./dL  Nitrite, UA negative    Leukocytes, UA Negative Negative   Appearance     Odor     Assessment/Plan: Hypertension BP stable. Asymptomatic. CMP today. Continue care and follow-up as directed by Cardiology.  Hypothyroidism Is now taking her medication as directed. Repeat TSH today. Will alter dose according to results.   Pelvic pain Episodic -- nighttime only. No palpable mass on abdominal examination. No noted tenderness on examination. ROM normal without pain. Urine dip negative. Labs today. Will check US pelvis to further assess.     Leeanne Rio, PA-C

## 2017-02-24 NOTE — Assessment & Plan Note (Signed)
BP stable. Asymptomatic. CMP today. Continue care and follow-up as directed by Cardiology.

## 2017-02-24 NOTE — Assessment & Plan Note (Signed)
Episodic -- nighttime only. No palpable mass on abdominal examination. No noted tenderness on examination. ROM normal without pain. Urine dip negative. Labs today. Will check US pelvis to further assess.

## 2017-02-24 NOTE — Assessment & Plan Note (Signed)
Is now taking her medication as directed. Repeat TSH today. Will alter dose according to results.

## 2017-02-25 ENCOUNTER — Telehealth: Payer: Self-pay | Admitting: *Deleted

## 2017-02-25 DIAGNOSIS — R102 Pelvic and perineal pain: Secondary | ICD-10-CM

## 2017-02-25 NOTE — Telephone Encounter (Signed)
Orders have been placed and faxed to [facility]

## 2017-02-25 NOTE — Telephone Encounter (Signed)
Copied from CRM [id number]. Topic: General - Other >> Feb 24, 2017  4:40 PM Windy KalataMichael, Taylor L, NT wrote: Reason for CRM: Mar[facility]Sarah [facility] and is stating that she received an order in regards to this patient. She states she will need an order for transvaginal also before she can contact this patient to schedule. Thank you  Fax number [phone]

## 2017-03-01 ENCOUNTER — Ambulatory Visit: Payer: BLUE CROSS/BLUE SHIELD | Admitting: Cardiology

## 2017-03-02 ENCOUNTER — Ambulatory Visit
Admission: RE | Admit: 2017-03-02 | Discharge: 2017-03-02 | Disposition: A | Discharge status: Home / Self Care | Payer: BLUE CROSS/BLUE SHIELD | Source: Ambulatory Visit | Attending: Physician Assistant | Admitting: Physician Assistant

## 2017-03-02 DIAGNOSIS — R102 Pelvic and perineal pain: Secondary | ICD-10-CM

## 2017-03-03 ENCOUNTER — Encounter: Payer: Self-pay | Admitting: Physician Assistant

## 2017-03-04 ENCOUNTER — Encounter: Payer: Self-pay | Admitting: Emergency Medicine

## 2017-03-04 ENCOUNTER — Other Ambulatory Visit (HOSPITAL_COMMUNITY): Payer: Self-pay | Admitting: *Deleted

## 2017-03-04 MED ORDER — SOTALOL HCL 80 MG PO TABS: 120.0000 mg | ORAL_TABLET | Freq: Two times a day (BID) | ORAL | 4 refills | Status: DC

## 2017-03-04 MED ORDER — POTASSIUM CHLORIDE CRYS ER 20 MEQ PO TBCR: EXTENDED_RELEASE_TABLET | ORAL | 1 refills | Status: DC

## 2017-03-08 ENCOUNTER — Telehealth: Payer: Self-pay | Admitting: Neurology

## 2017-03-08 ENCOUNTER — Ambulatory Visit (INDEPENDENT_AMBULATORY_CARE_PROVIDER_SITE_OTHER): Payer: BLUE CROSS/BLUE SHIELD | Admitting: Neurology

## 2017-03-08 ENCOUNTER — Telehealth: Payer: Self-pay | Admitting: *Deleted

## 2017-03-08 ENCOUNTER — Encounter: Payer: Self-pay | Admitting: Neurology

## 2017-03-08 VITALS — BP 125/74 | HR 64 | Ht 67.0 in | Wt 196.0 lb

## 2017-03-08 DIAGNOSIS — J383 Other diseases of vocal cords: Secondary | ICD-10-CM | POA: Diagnosis not present

## 2017-03-08 NOTE — Telephone Encounter (Signed)
Patient needs Botox appt w/ Dr. Terrace ArabiaYan and Dr. Jenne PaneBates.

## 2017-03-08 NOTE — Progress Notes (Signed)
PATIENT: Ashley Woodard DOB: 14-Oct-1950  Chief Complaint  Patient presents with  . Spasmodic Dystonia    She is here to discuss treatment options for her voice tremors.  Marland Kitchen ENT    Christia Reading, MD - referring MD  . PCP    Waldon Merl, PA-C     HISTORICAL  Ashley Woodard is a 67 year old female, seen in refer by ENT physician Dr. Jenne Pane, Karren Burly for evaluation of dysphonia, initial evaluation was on March 08, 2017.  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  She noticed shaky voice with throat tightness since 2016,  the problem has worsened with more talking, the voice is strained, "sound like I am mad",  stress and anxiety aggravate the symptoms, it impact her work life,   never received any treatment in the past.   She denies dysarthria, no dysphagia, she reported family history of tremor, her maternal aunt suffered essential tremor, maternal uncle has severe essential tremor, spastic dysphonia, required deep brain stimulator, her 45 year old son suffered bilateral hand tremor  For laparoscopic laryngoscope vocal fold without mass, scarring, or ulceration, the vocal folds adduct and abduct to symmetrically, but there is good glottal closure, there is rhythmic spasm of the vocal fold with phonation in adduction pattern, she was recommended laryngeal Botox treatment   REVIEW OF SYSTEMS: Full 14 system review of systems performed and notable only for cold intolerance, excessive eating, environmental allergies, leg swelling, depression  ALLERGIES: Allergies  Allergen Reactions  . Demeclocycline Rash  . Doxycycline Rash  . Erythromycin Rash  . Lisinopril Cough  . Tetracyclines & Related Rash    HOME MEDICATIONS: Current Outpatient Medications  Medication Sig Dispense Refill  . amLODipine  (NORVASC) 10 MG tablet TAKE 1 TABLET(10 MG) BY MOUTH DAILY 30 tablet 6  . Cholecalciferol (VITAMIN D PO) Take by mouth.    . diltiazem (CARDIZEM) 30 MG tablet Take 1 tablet every 4 hours AS NEEDED for AFIB heart rate over 100 45 tablet 1  . DULoxetine (CYMBALTA) 60 MG capsule Take 1 capsule by mouth daily. Take a sleep aide for sleep. She does not know the name  4  . glucose blood (CONTOUR NEXT TEST) test strip Check blood sugars daily 100 each 6  . hydrALAZINE (APRESOLINE) 50 MG tablet Take 1 tablet by mouth 2 (two) times daily.  7  . irbesartan (AVAPRO) 150 MG tablet Take 1 tablet (150 mg total) by mouth daily. 90 tablet 1  . LANCETS ULTRA THIN 30G MISC 1 Stick by Does not apply route daily. 100 each 6  . levothyroxine (SYNTHROID, LEVOTHROID) 75 MCG tablet Take 1 tablet by mouth daily.  2  . Magnesium 200 MG TABS Take 1 tablet by mouth daily.    . metFORMIN (GLUCOPHAGE) 500 MG tablet Take 1 tablet (500 mg total) by mouth 2 (two) times daily with a meal.  5  . Omega-3 Fatty Acids (FISH OIL) 1000 MG CAPS Take 1 capsule by mouth daily.    . potassium chloride SA (K-DUR,KLOR-CON) 20 MEQ tablet take 2 tablets in the morning and 1 tablet in the afternoon 180 tablet 1  . simvastatin (ZOCOR) 20 MG tablet Take 1 tablet (20 mg total) by mouth 3 (three) times a week. 90 tablet 1  . sotalol (BETAPACE) 80 MG tablet Take 1.5 tablets (  120 mg total) by mouth 2 (two) times daily. 90 tablet 4  . vitamin B-12 (CYANOCOBALAMIN) 1000 MCG tablet Take 1 tablet (1,000 mcg total) by mouth daily. 30 tablet 0  . XARELTO 20 MG TABS tablet Take 1 tablet by mouth at bedtime.   5  . hydrochlorothiazide (HYDRODIURIL) 25 MG tablet Take 1 tablet (25 mg total) by mouth daily. 90 tablet 1   No current facility-administered medications for this visit.     PAST MEDICAL HISTORY: Past Medical History:  Diagnosis Date  . Abnormal cardiovascular stress test 08/21/2016   a. done for abnl EKG/cardiac risk factors -> admitted for  cath 07/2016 which showed no significant CAD, normal LV contraction, mildly elevated filling pressure. Low dose Imdur was added for possible component of microvascular dysfunction.   Marland Kitchen. Anxiety   . Arthritis   . Colon polyps   . Deafness in right ear   . Depression   . Diabetes mellitus without complication (HCC)   . Diverticulosis   . Hypertension   . Hypertriglyceridemia   . Migraine   . Persistent atrial fibrillation (HCC)   . S/P cardiac catheterization, 08/20/16 minimal CAD  08/21/2016  . Sleep apnea   . Thyroid disease   . Voice tremor     PAST SURGICAL HISTORY: Past Surgical History:  Procedure Laterality Date  . ABDOMINAL HYSTERECTOMY  1988  . COCHLEAR IMPLANT  2008  . LEFT HEART CATH AND CORONARY ANGIOGRAPHY N/A 08/20/2016   Procedure: Left Heart Cath and Coronary Angiography;  Surgeon: Yvonne KendallEnd, Christopher, MD;  Location: [facility];  Service: Cardiovascular;  Laterality: N/A;    FAMILY HISTORY: Family History  Problem Relation Age of Onset  . Hypertension Mother   . Diabetes Mother   . Heart attack Mother   . Hypertension Father   . Heart attack Father   . Stroke Father   . Cancer Sister        Breast  . Hypertension Brother   . Cancer Brother        Prostate  . Diabetes Brother   . Dementia Brother   . Stroke Brother   . Heart attack Brother   . Hypertension Son   . Cancer Maternal Aunt        Breast  . Cancer Maternal Uncle        Lung    SOCIAL HISTORY:  Social History   Socioeconomic History  . Marital status: Married    Spouse name: Not on file  . Number of children: 2  . Years of education: 6112  . Highest education level: Not on file  Social Needs  . Financial resource strain: Not on file  . Food insecurity - worry: Not on file  . Food insecurity - inability: Not on file  . Transportation needs - medical: Not on file  . Transportation needs - non-medical: Not on file  Occupational History  . Occupation: Art therapistGeneral Manager  Tobacco Use    . Smoking status: Never Smoker  . Smokeless tobacco: Never Used  Substance and Sexual Activity  . Alcohol use: No  . Drug use: No  . Sexual activity: Yes  Other Topics Concern  . Not on file  Social History Narrative   Lives at home with husband.   Right-handed.   No caffeine use.     PHYSICAL EXAM   Vitals:   03/08/17 0745  BP: 125/74  Pulse: 64  Weight: 196 lb (88.9 kg)  Height: 5\' 7"  (1.702 m)  Not recorded      Body mass index is 30.7 kg/m.  PHYSICAL EXAMNIATION:  Gen: NAD, conversant, well nourised, obese, well groomed                     Cardiovascular: Regular rate rhythm, no peripheral edema, warm, nontender. Eyes: Conjunctivae clear without exudates or hemorrhage Neck: Supple, no carotid bruits. Pulmonary: Clear to auscultation bilaterally   NEUROLOGICAL EXAM:  MENTAL STATUS: Speech:    Dysphonic speech, fluent and spontaneous with normal comprehension.  Cognition:     Orientation to time, place and person     Normal recent and remote memory     Normal Attention span and concentration     Normal Language, naming, repeating,spontaneous speech     Fund of knowledge   CRANIAL NERVES: CN II: Visual fields are full to confrontation. Fundoscopic exam is normal with sharp discs and no vascular changes. Pupils are round equal and briskly reactive to light. CN III, IV, VI: extraocular movement are normal. No ptosis. CN V: Facial sensation is intact to pinprick in all 3 divisions bilaterally. Corneal responses are intact.  CN VII: Face is symmetric with normal eye closure and smile. CN VIII: Hearing is normal to rubbing fingers CN IX, X: Palate elevates symmetrically. Phonation is normal. CN XI: Head turning and shoulder shrug are intact CN XII: Tongue is midline with normal movements and no atrophy.  MOTOR: There is no pronator drift of out-stretched arms. Muscle bulk and tone are normal. Muscle strength is normal.  REFLEXES: Reflexes are 2+ and  symmetric at the biceps, triceps, knees, and ankles. Plantar responses are flexor.  SENSORY: Intact to light touch, pinprick, positional sensation and vibratory sensation are intact in fingers and toes.  COORDINATION: Rapid alternating movements and fine finger movements are intact. There is no dysmetria on finger-to-nose and heel-knee-shin.    GAIT/STANCE: Posture is normal. Gait is steady with normal steps, base, arm swing, and turning. Heel and toe walking are normal. Tandem gait is normal.  Romberg is absent.   DIAGNOSTIC DATA (LABS, IMAGING, TESTING) - I reviewed patient records, labs, notes, testing and imaging myself where available.   ASSESSMENT AND PLAN  Ashley Woodard is a 67 y.o. female   Spastic dysphonia  She is a candidate for EMG guided botulism toxin injection, start preauthorization,  Will coordinate with Dr. Jenne Pane office to do the injection under EMG guidance   Levert Feinstein, M.D. Ph.D.  [facility] [address], [address] [place], [place] [address] Ph: [phone] Fax: [phone]  CC: Christia Reading, MD

## 2017-03-08 NOTE — Telephone Encounter (Signed)
Please follow up on patient for EMG guided dysphonic injection with Dr. Jenne PaneBates,  Start preauthorization today.

## 2017-03-08 NOTE — Telephone Encounter (Signed)
Noted  

## 2017-03-10 ENCOUNTER — Encounter: Payer: Self-pay | Admitting: Physician Assistant

## 2017-03-11 ENCOUNTER — Encounter: Payer: Self-pay | Admitting: Internal Medicine

## 2017-03-11 ENCOUNTER — Other Ambulatory Visit: Payer: Self-pay | Admitting: Physician Assistant

## 2017-03-11 ENCOUNTER — Ambulatory Visit (INDEPENDENT_AMBULATORY_CARE_PROVIDER_SITE_OTHER): Payer: BLUE CROSS/BLUE SHIELD | Admitting: Internal Medicine

## 2017-03-11 VITALS — BP 128/64 | HR 59 | Ht 67.0 in | Wt 196.2 lb

## 2017-03-11 DIAGNOSIS — I480 Paroxysmal atrial fibrillation: Secondary | ICD-10-CM | POA: Diagnosis not present

## 2017-03-11 DIAGNOSIS — I10 Essential (primary) hypertension: Secondary | ICD-10-CM | POA: Diagnosis not present

## 2017-03-11 MED ORDER — AMLODIPINE BESYLATE 10 MG PO TABS: ORAL_TABLET | ORAL | 6 refills | Status: DC

## 2017-03-11 NOTE — Patient Instructions (Signed)
Medication Instructions:  Your physician recommends that you continue on your current medications as directed. Please refer to the Current Medication list given to you today.   Labwork: None ordered   Testing/Procedures: None ordered   Follow-Up: Your physician recommends that you schedule a follow-up appointment in: 3 months with Rudi Cocoonna Carroll, [facility]   Any Other Special Instructions Will Be Listed Below (If Applicable).     If you need a refill on your cardiac medications before your next appointment, please call your pharmacy.

## 2017-03-11 NOTE — Progress Notes (Signed)
Electrophysiology Office Note   Date:  03/11/2017   ID:  Ashley Woodard, DOB 05-30-1950, MRN [id number]  PCP:  Waldon Merl, PA-C  Cardiologist:  Dr Mayford Knife Primary Electrophysiologist: Hillis Range, MD    Chief Complaint  Patient presents with  . Atrial Fibrillation     History of Present Illness: Ashley Woodard is a 67 y.o. female who presents today for electrophysiology evaluation.   She is referred by Rudi Coco NP and Armanda Magic for EP consultation regarding her paroxysmal atrial fibrillation.  She reports being diagnosed with atrial fibrillation approximately 20 years ago.  In retrospect, she thinks that shes had symptoms since her 30s.  She has been on sotalol for years and has done well.  In December, she began having increasing frequency and duration of afib.  She is unaware of triggers. She was seen in the AF clinic and her sotalol was increased to 120mg  BID.  She thinks that she is doing better now.  She states "I have not had afib in 4 days".  Today, she denies symptoms of palpitations, chest pain, shortness of breath, orthopnea, PND, lower extremity edema, claudication, dizziness, presyncope, syncope, bleeding, or neurologic sequela. The patient is tolerating medications without difficulties and is otherwise without complaint today.    Past Medical History:  Diagnosis Date  . Abnormal cardiovascular stress test 08/21/2016   a. done for abnl EKG/cardiac risk factors -> admitted for cath 07/2016 which showed no significant CAD, normal LV contraction, mildly elevated filling pressure. Low dose Imdur was added for possible component of microvascular dysfunction.   Marland Kitchen Anxiety   . Arthritis   . Colon polyps   . Deafness in right ear   . Depression   . Diabetes mellitus without complication (HCC)   . Diverticulosis   . Hypertension   . Hypertriglyceridemia   . Migraine   . Paroxysmal atrial fibrillation (HCC)   . S/P cardiac catheterization, 08/20/16 minimal CAD   08/21/2016  . Sleep apnea    mild per patient  . Thyroid disease   . Voice tremor    Past Surgical History:  Procedure Laterality Date  . ABDOMINAL HYSTERECTOMY  1988  . COCHLEAR IMPLANT  2008  . LEFT HEART CATH AND CORONARY ANGIOGRAPHY N/A 08/20/2016   Procedure: Left Heart Cath and Coronary Angiography;  Surgeon: Yvonne Kendall, MD;  Location: [facility];  Service: Cardiovascular;  Laterality: N/A;     Current Outpatient Medications  Medication Sig Dispense Refill  . amLODipine (NORVASC) 10 MG tablet TAKE 1 TABLET(10 MG) BY MOUTH DAILY 30 tablet 6  . Cholecalciferol (VITAMIN D PO) Take by mouth.    . diltiazem (CARDIZEM) 30 MG tablet Take 1 tablet every 4 hours AS NEEDED for AFIB heart rate over 100 45 tablet 1  . DULoxetine (CYMBALTA) 60 MG capsule Take 1 capsule by mouth daily. Take a sleep aide for sleep. She does not know the name  4  . glucose blood (CONTOUR NEXT TEST) test strip Check blood sugars daily 100 each 6  . hydrALAZINE (APRESOLINE) 50 MG tablet Take 1 tablet by mouth 2 (two) times daily.  7  . irbesartan (AVAPRO) 150 MG tablet Take 1 tablet (150 mg total) by mouth daily. 90 tablet 1  . LANCETS ULTRA THIN 30G MISC 1 Stick by Does not apply route daily. 100 each 6  . levothyroxine (SYNTHROID, LEVOTHROID) 75 MCG tablet Take 1 tablet by mouth daily.  2  . Magnesium 200 MG TABS  Take 1 tablet by mouth daily.    . metFORMIN (GLUCOPHAGE) 500 MG tablet Take 1 tablet (500 mg total) by mouth 2 (two) times daily with a meal.  5  . Omega-3 Fatty Acids (FISH OIL) 1000 MG CAPS Take 1 capsule by mouth daily.    . potassium chloride SA (K-DUR,KLOR-CON) 20 MEQ tablet take 2 tablets in the morning and 1 tablet in the afternoon 180 tablet 1  . simvastatin (ZOCOR) 20 MG tablet Take 1 tablet (20 mg total) by mouth 3 (three) times a week. 90 tablet 1  . sotalol (BETAPACE) 80 MG tablet Take 1.5 tablets (120 mg total) by mouth 2 (two) times daily. 90 tablet 4  . vitamin B-12  (CYANOCOBALAMIN) 1000 MCG tablet Take 1 tablet (1,000 mcg total) by mouth daily. 30 tablet 0  . XARELTO 20 MG TABS tablet Take 1 tablet by mouth at bedtime.   5  . hydrochlorothiazide (HYDRODIURIL) 25 MG tablet Take 1 tablet (25 mg total) by mouth daily. 90 tablet 1   No current facility-administered medications for this visit.     Allergies:   Demeclocycline; Doxycycline; Erythromycin; Lisinopril; and Tetracyclines & related   Social History:  The patient  reports that  has never smoked. she has never used smokeless tobacco. She reports that she does not drink alcohol or use drugs.   Family History:  The patient's  family history includes Cancer in her brother, maternal aunt, maternal uncle, and sister; Dementia in her brother; Diabetes in her brother and mother; Heart attack in her brother, father, and mother; Hypertension in her brother, father, mother, and son; Stroke in her brother and father.    ROS:  Please see the history of present illness.   All other systems are personally reviewed and negative.    PHYSICAL EXAM: VS:  BP 128/64   Pulse (!) 59   Ht 5\' 7"  (1.702 m)   Wt 196 lb 4 oz (89 kg)   BMI 30.74 kg/m  , BMI Body mass index is 30.74 kg/m. GEN: Well nourished, well developed, in no acute distress  HEENT: normal  Neck: no JVD, carotid bruits, or masses Cardiac: RRR; no murmurs, rubs, or gallops,no edema  Respiratory:  clear to auscultation bilaterally, normal work of breathing GI: soft, nontender, nondistended, + BS MS: no deformity or atrophy  Skin: warm and dry  Neuro:  Strength and sensation are intact Psych: euthymic mood, full affect  EKG:  EKG is ordered today. The ekg ordered today is personally reviewed and shows sinus rhythm 58 bpm, PR 182 msec, QRS 90 msec, Qtc 445 msec   Recent Labs: 08/19/2016: Hemoglobin 13.3; Platelets 350 02/24/2017: ALT 14; BUN 22; Creatinine, Ser 0.93; Magnesium 2.2; Potassium 4.4; Sodium 141; TSH 2.02  personally reviewed    Lipid Panel     Component Value Date/Time   CHOL 129 04/12/2016   TRIG 429 (A) 04/12/2016   HDL 37 04/12/2016   LDLCALC 49 04/12/2016   personally reviewed   Wt Readings from Last 3 Encounters:  03/11/17 196 lb 4 oz (89 kg)  03/08/17 196 lb (88.9 kg)  02/24/17 195 lb (88.5 kg)     Other studies personally reviewed: Additional studies/ records that were reviewed today include:   AF clinic notes, prior cath/echo Review of the above records today demonstrates: as above   ASSESSMENT AND PLAN:  1.  Paroxysmal atrial fibrillation Episodes last < 7 days Has not previously required cardioversion Symptomatic with recent increasing episodes despite sotalol.  Therapeutic strategies for afib including medicine and ablation were discussed in detail with the patient today. Risk, benefits, and alternatives to EP study and radiofrequency ablation for afib were also discussed in detail today. These risks include but are not limited to stroke, bleeding, vascular damage, tamponade, perforation, damage to the esophagus, lungs, and other structures, pulmonary vein stenosis, worsening renal function, and death. The patient understands these risk and wishes to think about this further.  If she has more afib then she may be more willing to proceed.  She would require cardiac CT within a week of ablation to exclude LAA thrombus.  chads2vasc score is currently 3.  She is on xarelto  2. HTN Stable No change required today   Follow-up with Dr Mayford Knifeurner as scheduled Return to see Lupita LeashDonna in the AF clinic in 3 months She will contact the AF clinic in the interim if she decides to pursue ablation.  I am happy to see when needed  Current medicines are reviewed at length with the patient today.   The patient does not have concerns regarding her medicines.  The following changes were made today:  none   Signed, Hillis RangeJames Nyonna Hargrove, MD  03/11/2017 8:49 AM     [facility] [address] [place] [id number] [phone] (office) [phone] (fax)

## 2017-03-16 NOTE — Telephone Encounter (Signed)
Patient scheduled.

## 2017-03-16 NOTE — Telephone Encounter (Signed)
I called to make the patient aware of her apt time but she did not answer so I left a VM asking her to call me back.

## 2017-03-21 NOTE — Telephone Encounter (Signed)
I called to make the patient aware of her apt but she did not answer so I left her a vm with the time and date of the apt.

## 2017-03-22 ENCOUNTER — Ambulatory Visit: Payer: BLUE CROSS/BLUE SHIELD | Admitting: Cardiology

## 2017-03-22 NOTE — Telephone Encounter (Signed)
I called the patient to make her aware of apt time, she did not answer so I left a VM asking her to call me back.

## 2017-03-24 ENCOUNTER — Other Ambulatory Visit: Payer: Self-pay | Admitting: Cardiology

## 2017-04-05 NOTE — Telephone Encounter (Signed)
Patient called and confirmed Botox appointment with Dr. Terrace ArabiaYan on 04-18-17 at 11am arrive at 10:30am.

## 2017-04-07 NOTE — Telephone Encounter (Signed)
Noted thank you

## 2017-04-18 ENCOUNTER — Encounter: Payer: Self-pay | Admitting: *Deleted

## 2017-04-18 ENCOUNTER — Ambulatory Visit (INDEPENDENT_AMBULATORY_CARE_PROVIDER_SITE_OTHER): Payer: BLUE CROSS/BLUE SHIELD | Admitting: Neurology

## 2017-04-18 ENCOUNTER — Encounter: Payer: Self-pay | Admitting: Neurology

## 2017-04-18 VITALS — BP 133/72 | HR 59 | Ht 67.0 in | Wt 197.5 lb

## 2017-04-18 DIAGNOSIS — J383 Other diseases of vocal cords: Secondary | ICD-10-CM | POA: Diagnosis not present

## 2017-04-18 DIAGNOSIS — J385 Laryngeal spasm: Secondary | ICD-10-CM | POA: Insufficient documentation

## 2017-04-18 MED ORDER — ONABOTULINUMTOXINA 100 UNITS IJ SOLR
100.0000 [IU] | Freq: Once | INTRAMUSCULAR | Status: AC
  Administered 2017-04-18: 100 [IU] via INTRAMUSCULAR

## 2017-04-18 NOTE — Progress Notes (Signed)
PATIENT: Ashley Woodard DOB: April 15, 1950  Chief Complaint  Patient presents with  . Laryngeal Spasm    Botox 100 units - office supply     HISTORICAL  Ashley Woodard is a 67 year old female, seen in refer by ENT physician Dr. Jenne Pane, Karren Burly for evaluation of dysphonia, initial evaluation was on March 08, 2017.  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  She noticed shaky voice with throat tightness since 2016,  the problem has worsened with more talking, the voice is strained, "sound like I am mad",  stress and anxiety aggravate the symptoms, it impact her work life,   never received any treatment in the past.   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 Demeclocycline Rash  . Doxycycline Rash  . Erythromycin Rash  . Lisinopril Cough  . Tetracyclines & Related Rash    HOME MEDICATIONS: Current Outpatient Medications  Medication Sig Dispense Refill  . amLODipine (NORVASC) 10 MG tablet TAKE 1  TABLET(10 MG) BY MOUTH DAILY 30 tablet 6  . Cholecalciferol (VITAMIN D PO) Take by mouth.    . diltiazem (CARDIZEM) 30 MG tablet Take 1 tablet every 4 hours AS NEEDED for AFIB heart rate over 100 45 tablet 1  . DULoxetine (CYMBALTA) 60 MG capsule Take 1 capsule by mouth daily. Take a sleep aide for sleep. She does not know the name  4  . glucose blood (CONTOUR NEXT TEST) test strip Check blood sugars daily 100 each 6  . hydrALAZINE (APRESOLINE) 50 MG tablet Take 1 tablet by mouth 2 (two) times daily.  7  . hydrochlorothiazide (HYDRODIURIL) 25 MG tablet Take 1 tablet (25 mg total) by mouth daily. Patient needs to call and schedule an appointment for further refills 1st attempt 30 tablet 0  . irbesartan (AVAPRO) 150 MG tablet Take 1 tablet (150 mg total) by mouth daily. Patient needs to call and schedule an appointment for further refills 1st attempt 30 tablet 0  . LANCETS ULTRA THIN 30G MISC 1 Stick by Does not apply route daily. 100 each 6  . levothyroxine (SYNTHROID, LEVOTHROID) 75 MCG tablet Take 1 tablet by mouth daily.  2  . Magnesium 200 MG TABS Take 1 tablet by mouth daily.    . metFORMIN (GLUCOPHAGE) 500 MG tablet Take 1 tablet (500 mg total) by mouth 2 (two) times daily with a meal.  5  . Omega-3 Fatty Acids (FISH OIL) 1000 MG CAPS Take 1 capsule by mouth daily.    . potassium chloride SA (K-DUR,KLOR-CON) 20 MEQ tablet take 2 tablets in the morning and 1 tablet in the afternoon 180 tablet 1  .  simvastatin (ZOCOR) 20 MG tablet Take 1 tablet (20 mg total) by mouth 3 (three) times a week. 90 tablet 1  . sotalol (BETAPACE) 80 MG tablet Take 1.5 tablets (120 mg total) by mouth 2 (two) times daily. 90 tablet 4  . vitamin B-12 (CYANOCOBALAMIN) 1000 MCG tablet Take 1 tablet (1,000 mcg total) by mouth daily. 30 tablet 0  . XARELTO 20 MG TABS tablet Take 1 tablet by mouth at bedtime.   5   No current facility-administered medications for this visit.     PAST MEDICAL HISTORY: Past Medical  History:  Diagnosis Date  . Abnormal cardiovascular stress test 08/21/2016   a. done for abnl EKG/cardiac risk factors -> admitted for cath 07/2016 which showed no significant CAD, normal LV contraction, mildly elevated filling pressure. Low dose Imdur was added for possible component of microvascular dysfunction.   Marland Kitchen. Anxiety   . Arthritis   . Colon polyps   . Deafness in right ear   . Depression   . Diabetes mellitus without complication (HCC)   . Diverticulosis   . Hypertension   . Hypertriglyceridemia   . Migraine   . Paroxysmal atrial fibrillation (HCC)   . S/P cardiac catheterization, 08/20/16 minimal CAD  08/21/2016  . Sleep apnea    mild per patient  . Thyroid disease   . Voice tremor     PAST SURGICAL HISTORY: Past Surgical History:  Procedure Laterality Date  . ABDOMINAL HYSTERECTOMY  1988  . COCHLEAR IMPLANT  2008  . LEFT HEART CATH AND CORONARY ANGIOGRAPHY N/A 08/20/2016   Procedure: Left Heart Cath and Coronary Angiography;  Surgeon: Yvonne KendallEnd, Christopher, MD;  Location: [facility];  Service: Cardiovascular;  Laterality: N/A;    FAMILY HISTORY: Family History  Problem Relation Age of Onset  . Hypertension Mother   . Diabetes Mother   . Heart attack Mother   . Hypertension Father   . Heart attack Father   . Stroke Father   . Cancer Sister        Breast  . Hypertension Brother   . Cancer Brother        Prostate  . Diabetes Brother   . Dementia Brother   . Stroke Brother   . Heart attack Brother   . Hypertension Son   . Cancer Maternal Aunt        Breast  . Cancer Maternal Uncle        Lung    SOCIAL HISTORY:  Social History   Socioeconomic History  . Marital status: Married    Spouse name: Not on file  . Number of children: 2  . Years of education: 6812  . Highest education level: Not on file  Social Needs  . Financial resource strain: Not on file  . Food insecurity - worry: Not on file  . Food insecurity - inability: Not on file  .  Transportation needs - medical: Not on file  . Transportation needs - non-medical: Not on file  Occupational History  . Occupation: Art therapistGeneral Manager  Tobacco Use  . Smoking status: Never Smoker  . Smokeless tobacco: Never Used  Substance and Sexual Activity  . Alcohol use: No  . Drug use: No  . Sexual activity: Yes  Other Topics Concern  . Not on file  Social History Narrative   [place].   Right-handed.   No caffeine use.   Manages a call center     PHYSICAL EXAM  Vitals:   04/18/17 1046  BP: 133/72  Pulse: (!) 59  Weight: 197 lb 8 oz (89.6 kg)  Height: 5\' 7"  (1.702 m)    Not recorded      Body mass index is 30.93 kg/m.  PHYSICAL EXAMNIATION:  Gen: NAD, conversant, well nourised, obese, well groomed                     Cardiovascular: Regular rate rhythm, no peripheral edema, warm, nontender. Eyes: Conjunctivae clear without exudates or hemorrhage Neck: Supple, no carotid bruits. Pulmonary: Clear to auscultation bilaterally   NEUROLOGICAL EXAM:  MENTAL STATUS: Speech:    Dysphonic speech, fluent and spontaneous with normal comprehension.  Cognition:     Orientation to time, place and person     Normal recent and remote memory     Normal Attention span and concentration     Normal Language, naming, repeating,spontaneous speech     Fund of knowledge   CRANIAL NERVES: CN II: Visual fields are full to confrontation. Fundoscopic exam is normal with sharp discs and no vascular changes. Pupils are round equal and briskly reactive to light. CN III, IV, VI: extraocular movement are normal. No ptosis. CN V: Facial sensation is intact to pinprick in all 3 divisions bilaterally. Corneal responses are intact.  CN VII: Face is symmetric with normal eye closure and smile. CN VIII: Hearing is normal to rubbing fingers CN IX, X: Palate elevates symmetrically. Phonation is normal. CN XI: Head turning and shoulder shrug are intact CN XII:  Tongue is midline with normal movements and no atrophy.  MOTOR: There is no pronator drift of out-stretched arms. Muscle bulk and tone are normal. Muscle strength is normal.  REFLEXES: Reflexes are 2+ and symmetric at the biceps, triceps, knees, and ankles. Plantar responses are flexor.  SENSORY: Intact to light touch, pinprick, positional sensation and vibratory sensation are intact in fingers and toes.  COORDINATION: Rapid alternating movements and fine finger movements are intact. There is no dysmetria on finger-to-nose and heel-knee-shin.    GAIT/STANCE: Posture is normal. Gait is steady with normal steps, base, arm swing, and turning. Heel and toe walking are normal. Tandem gait is normal.  Romberg is absent.   DIAGNOSTIC DATA (LABS, IMAGING, TESTING) - I reviewed patient records, labs, notes, testing and imaging myself where available.   ASSESSMENT AND PLAN  Ashley Woodard is a 67 y.o. female   Spastic dysphonia  under EMG guidance, BOTOX A was injected into bilateral thyroarytenoid muscles, 2.0 Units at each side.       Levert Feinstein, M.D. Ph.D.  [facility] [address], [address] [place], [place] [address] Ph: [phone] Fax: [phone]  CC: Christia Reading, MD

## 2017-04-18 NOTE — Progress Notes (Signed)
**  Botox 100 units x 1 vials, NDC [id number], Lot [id number], Exp 09/2119, office supply.//mck,rn**

## 2017-04-19 ENCOUNTER — Ambulatory Visit: Payer: BLUE CROSS/BLUE SHIELD | Admitting: Neurology

## 2017-04-19 ENCOUNTER — Other Ambulatory Visit (HOSPITAL_COMMUNITY): Payer: Self-pay | Admitting: *Deleted

## 2017-04-19 DIAGNOSIS — I4819 Other persistent atrial fibrillation: Secondary | ICD-10-CM

## 2017-04-26 ENCOUNTER — Other Ambulatory Visit: Payer: Self-pay | Admitting: Cardiology

## 2017-04-27 ENCOUNTER — Telehealth: Payer: Self-pay | Admitting: *Deleted

## 2017-04-27 ENCOUNTER — Other Ambulatory Visit: Payer: BLUE CROSS/BLUE SHIELD | Admitting: *Deleted

## 2017-04-27 DIAGNOSIS — I4819 Other persistent atrial fibrillation: Secondary | ICD-10-CM

## 2017-04-27 DIAGNOSIS — I10 Essential (primary) hypertension: Secondary | ICD-10-CM

## 2017-04-27 DIAGNOSIS — E785 Hyperlipidemia, unspecified: Secondary | ICD-10-CM

## 2017-04-27 LAB — BASIC METABOLIC PANEL
BUN/Creatinine Ratio: 20 (ref 12–28)
BUN: 18 mg/dL (ref 8–27)
CALCIUM: 9.3 mg/dL (ref 8.7–10.3)
CO2: 24 mmol/L (ref 20–29)
Chloride: 103 mmol/L (ref 96–106)
Creatinine, Ser: 0.92 mg/dL (ref 0.57–1.00)
GFR calc non Af Amer: 66 mL/min/{1.73_m2} (ref >59)
GFR, EST AFRICAN AMERICAN: 76 mL/min/{1.73_m2} (ref >59)
Glucose: 158 mg/dL — ABNORMAL HIGH (ref 65–99)
Potassium: 4.2 mmol/L (ref 3.5–5.2)
Sodium: 142 mmol/L (ref 134–144)

## 2017-04-27 LAB — CBC WITH DIFFERENTIAL/PLATELET
BASOS ABS: 0 10*3/uL (ref 0.0–0.2)
BASOS: 0 %
EOS (ABSOLUTE): 0.2 10*3/uL (ref 0.0–0.4)
Eos: 3 %
Hematocrit: 38.6 % (ref 34.0–46.6)
Hemoglobin: 12.7 g/dL (ref 11.1–15.9)
Immature Grans (Abs): 0 10*3/uL (ref 0.0–0.1)
Immature Granulocytes: 0 %
Lymphocytes Absolute: 2.4 10*3/uL (ref 0.7–3.1)
Lymphs: 33 %
MCH: 28.2 pg (ref 26.6–33.0)
MCHC: 32.9 g/dL (ref 31.5–35.7)
MCV: 86 fL (ref 79–97)
MONOS ABS: 0.4 10*3/uL (ref 0.1–0.9)
Monocytes: 6 %
NEUTROS ABS: 4.1 10*3/uL (ref 1.4–7.0)
Neutrophils: 58 %
PLATELETS: 326 10*3/uL (ref 150–379)
RBC: 4.51 x10E6/uL (ref 3.77–5.28)
RDW: 14.8 % (ref 12.3–15.4)
WBC: 7.1 10*3/uL (ref 3.4–10.8)

## 2017-04-27 NOTE — Telephone Encounter (Signed)
Order for BMET and CBC W DIFF placed in the system for the pt to have done this morning.  Labs had to be re-entered, for they were placed in for [facility] and not [facility], per our lab tech.  Correction made.

## 2017-04-28 ENCOUNTER — Telehealth: Payer: Self-pay

## 2017-04-28 NOTE — Telephone Encounter (Signed)
-----   Message from James Allred, MD sent at 04/27/2017  5:11 PM EST ----- Results reviewed.  Jenny, please inform pt of result. I will route to primary care also. 

## 2017-04-28 NOTE — Telephone Encounter (Signed)
Pt is aware and agreeable to normal results and that PCP has been made aware of results as well.

## 2017-05-05 ENCOUNTER — Ambulatory Visit (HOSPITAL_COMMUNITY): Payer: BLUE CROSS/BLUE SHIELD

## 2017-05-05 ENCOUNTER — Ambulatory Visit (HOSPITAL_COMMUNITY)
Admission: RE | Admit: 2017-05-05 | Discharge: 2017-05-05 | Disposition: A | Discharge status: Home / Self Care | Payer: BLUE CROSS/BLUE SHIELD | Source: Ambulatory Visit | Attending: Internal Medicine | Admitting: Internal Medicine

## 2017-05-05 DIAGNOSIS — I4819 Other persistent atrial fibrillation: Secondary | ICD-10-CM

## 2017-05-05 DIAGNOSIS — I481 Persistent atrial fibrillation: Secondary | ICD-10-CM | POA: Insufficient documentation

## 2017-05-05 DIAGNOSIS — R079 Chest pain, unspecified: Secondary | ICD-10-CM | POA: Diagnosis not present

## 2017-05-05 MED ORDER — NITROGLYCERIN 0.4 MG SL SUBL
0.8000 mg | SUBLINGUAL_TABLET | Freq: Once | SUBLINGUAL | Status: AC
  Administered 2017-05-05: 0.8 mg via SUBLINGUAL
  Filled 2017-05-05: qty 25

## 2017-05-05 MED ORDER — IOPAMIDOL (ISOVUE-300) INJECTION 61%
INTRAVENOUS | Status: AC
  Filled 2017-05-05: qty 100

## 2017-05-05 MED ORDER — IOPAMIDOL (ISOVUE-370) INJECTION 76%
INTRAVENOUS | Status: AC
  Administered 2017-05-05: 100 mL
  Filled 2017-05-05: qty 100

## 2017-05-05 MED ORDER — NITROGLYCERIN 0.4 MG SL SUBL
SUBLINGUAL_TABLET | SUBLINGUAL | Status: AC
  Filled 2017-05-05: qty 2

## 2017-05-05 NOTE — Progress Notes (Signed)
Patient states she feels "ok, no dizziness." BP normotensive, IV removed. Patient walked to waiting room with no issues. Coffee given to patient to ward off nitro-headache.

## 2017-05-09 NOTE — Anesthesia Preprocedure Evaluation (Signed)
Anesthesia Evaluation  Patient identified by MRN, date of birth, ID band Patient awake    Reviewed: Allergy & Precautions, NPO status , Patient's Chart, lab work & pertinent test results  Airway Mallampati: II  TM Distance: >3 FB Neck ROM: Full    Dental no notable dental hx.    Pulmonary neg pulmonary ROS, sleep apnea ,    Pulmonary exam normal breath sounds clear to auscultation       Cardiovascular hypertension, Pt. on medications (-) anginaNormal cardiovascular exam Rhythm:Regular Rate:Normal  Abnormal cardiovascular stress test 08/21/2016 a. done for abnl EKG/cardiac risk factors -> admitted for cath 07/2016 which showed no significant CAD, normal LV contraction, mildly elevated filling pressure. Low dose Imdur was added for possible component of microvascular dysfunction    Neuro/Psych  Headaches, PSYCHIATRIC DISORDERS Anxiety Depression    GI/Hepatic negative GI ROS, Neg liver ROS,   Endo/Other  diabetes, Insulin DependentHypothyroidism   Renal/GU negative Renal ROS  negative genitourinary   Musculoskeletal  (+) Arthritis , Osteoarthritis,    Abdominal   Peds negative pediatric ROS (+)  Hematology negative hematology ROS (+)   Anesthesia Other Findings   Reproductive/Obstetrics negative OB ROS                             Anesthesia Physical Anesthesia Plan  ASA: III  Anesthesia Plan: General   Post-op Pain Management:    Induction: Intravenous  PONV Risk Score and Plan: 3 and Ondansetron and Treatment may vary due to age or medical condition  Airway Management Planned: Oral ETT and LMA  Additional Equipment:   Intra-op Plan:   Post-operative Plan: Extubation in OR  Informed Consent: I have reviewed the patients History and Physical, chart, labs and discussed the procedure including the risks, benefits and alternatives for the proposed anesthesia with the patient or  authorized representative who has indicated his/her understanding and acceptance.     Plan Discussed with: CRNA, Anesthesiologist and Surgeon  Anesthesia Plan Comments: (  )        Anesthesia Quick Evaluation

## 2017-05-10 ENCOUNTER — Encounter (HOSPITAL_COMMUNITY): Payer: Self-pay | Admitting: Critical Care Medicine

## 2017-05-10 ENCOUNTER — Ambulatory Visit (HOSPITAL_COMMUNITY): Payer: BLUE CROSS/BLUE SHIELD | Admitting: Anesthesiology

## 2017-05-10 ENCOUNTER — Other Ambulatory Visit: Payer: Self-pay

## 2017-05-10 ENCOUNTER — Ambulatory Visit (HOSPITAL_COMMUNITY)
Admission: RE | Disposition: A | Discharge status: Home / Self Care | Payer: Self-pay | Source: Ambulatory Visit | Attending: Internal Medicine

## 2017-05-10 ENCOUNTER — Ambulatory Visit (HOSPITAL_COMMUNITY)
Admission: RE | Admit: 2017-05-10 | Discharge: 2017-05-10 | Disposition: A | Discharge status: Home / Self Care | Payer: BLUE CROSS/BLUE SHIELD | Source: Ambulatory Visit | Attending: Internal Medicine | Admitting: Internal Medicine

## 2017-05-10 DIAGNOSIS — E781 Pure hyperglyceridemia: Secondary | ICD-10-CM | POA: Insufficient documentation

## 2017-05-10 DIAGNOSIS — G43909 Migraine, unspecified, not intractable, without status migrainosus: Secondary | ICD-10-CM | POA: Diagnosis not present

## 2017-05-10 DIAGNOSIS — E119 Type 2 diabetes mellitus without complications: Secondary | ICD-10-CM | POA: Diagnosis not present

## 2017-05-10 DIAGNOSIS — Z8249 Family history of ischemic heart disease and other diseases of the circulatory system: Secondary | ICD-10-CM | POA: Insufficient documentation

## 2017-05-10 DIAGNOSIS — F329 Major depressive disorder, single episode, unspecified: Secondary | ICD-10-CM | POA: Insufficient documentation

## 2017-05-10 DIAGNOSIS — E079 Disorder of thyroid, unspecified: Secondary | ICD-10-CM | POA: Diagnosis not present

## 2017-05-10 DIAGNOSIS — Z7984 Long term (current) use of oral hypoglycemic drugs: Secondary | ICD-10-CM | POA: Insufficient documentation

## 2017-05-10 DIAGNOSIS — G4730 Sleep apnea, unspecified: Secondary | ICD-10-CM | POA: Diagnosis not present

## 2017-05-10 DIAGNOSIS — I10 Essential (primary) hypertension: Secondary | ICD-10-CM | POA: Diagnosis not present

## 2017-05-10 DIAGNOSIS — I480 Paroxysmal atrial fibrillation: Secondary | ICD-10-CM | POA: Diagnosis present

## 2017-05-10 DIAGNOSIS — Z7901 Long term (current) use of anticoagulants: Secondary | ICD-10-CM | POA: Diagnosis not present

## 2017-05-10 DIAGNOSIS — F419 Anxiety disorder, unspecified: Secondary | ICD-10-CM | POA: Insufficient documentation

## 2017-05-10 DIAGNOSIS — M1990 Unspecified osteoarthritis, unspecified site: Secondary | ICD-10-CM | POA: Diagnosis not present

## 2017-05-10 HISTORY — DX: Hypothyroidism, unspecified: E03.9

## 2017-05-10 HISTORY — DX: Low back pain: M54.5

## 2017-05-10 HISTORY — DX: Other chronic pain: G89.29

## 2017-05-10 HISTORY — DX: Low back pain, unspecified: M54.50

## 2017-05-10 HISTORY — DX: Nausea with vomiting, unspecified: R11.2

## 2017-05-10 HISTORY — DX: Pneumonia, unspecified organism: J18.9

## 2017-05-10 HISTORY — PX: ATRIAL FIBRILLATION ABLATION: EP1191

## 2017-05-10 HISTORY — DX: Other specified postprocedural states: Z98.890

## 2017-05-10 HISTORY — DX: Type 2 diabetes mellitus without complications: E11.9

## 2017-05-10 LAB — GLUCOSE, CAPILLARY
Glucose-Capillary: 132 mg/dL — ABNORMAL HIGH (ref 65–99)
Glucose-Capillary: 120 mg/dL — ABNORMAL HIGH (ref 65–99)
Glucose-Capillary: 133 mg/dL — ABNORMAL HIGH (ref 65–99)

## 2017-05-10 LAB — POCT ACTIVATED CLOTTING TIME
ACTIVATED CLOTTING TIME: 329 s
ACTIVATED CLOTTING TIME: 313 s
Activated Clotting Time: 356 seconds
Activated Clotting Time: 356 seconds

## 2017-05-10 SURGERY — ATRIAL FIBRILLATION ABLATION
Anesthesia: General

## 2017-05-10 MED ORDER — HYDROCHLOROTHIAZIDE 25 MG PO TABS: 25.0000 mg | ORAL_TABLET | Freq: Every day | ORAL | Status: DC

## 2017-05-10 MED ORDER — MAGNESIUM 200 MG PO TABS: 200.0000 mg | ORAL_TABLET | Freq: Every day | ORAL | Status: DC

## 2017-05-10 MED ORDER — ISOPROTERENOL HCL 0.2 MG/ML IJ SOLN
INTRAMUSCULAR | Status: AC
  Filled 2017-05-10: qty 5

## 2017-05-10 MED ORDER — IOPAMIDOL (ISOVUE-370) INJECTION 76%
INTRAVENOUS | Status: DC | PRN
  Administered 2017-05-10: 6 mL via INTRAVENOUS

## 2017-05-10 MED ORDER — AMLODIPINE BESYLATE 10 MG PO TABS: 10.0000 mg | ORAL_TABLET | Freq: Every day | ORAL | Status: DC

## 2017-05-10 MED ORDER — PROPOFOL 500 MG/50ML IV EMUL
INTRAVENOUS | Status: DC | PRN
  Administered 2017-05-10: 50 ug/kg/min via INTRAVENOUS
  Administered 2017-05-10: 09:00:00 via INTRAVENOUS

## 2017-05-10 MED ORDER — ONDANSETRON HCL 4 MG/2ML IJ SOLN: 4.0000 mg | Freq: Four times a day (QID) | INTRAMUSCULAR | Status: DC | PRN

## 2017-05-10 MED ORDER — BUPIVACAINE HCL (PF) 0.25 % IJ SOLN
INTRAMUSCULAR | Status: DC | PRN
  Administered 2017-05-10: 25 mL

## 2017-05-10 MED ORDER — PANTOPRAZOLE SODIUM 40 MG PO TBEC: 40.0000 mg | DELAYED_RELEASE_TABLET | Freq: Every day | ORAL | 0 refills | Status: DC

## 2017-05-10 MED ORDER — INSULIN ASPART 100 UNIT/ML ~~LOC~~ SOLN: 0.0000 [IU] | Freq: Three times a day (TID) | SUBCUTANEOUS | Status: DC

## 2017-05-10 MED ORDER — HEPARIN (PORCINE) IN NACL 2-0.9 UNIT/ML-% IJ SOLN
INTRAMUSCULAR | Status: AC
  Filled 2017-05-10: qty 500

## 2017-05-10 MED ORDER — FENTANYL CITRATE (PF) 100 MCG/2ML IJ SOLN
25.0000 ug | INTRAMUSCULAR | Status: DC | PRN
  Administered 2017-05-10 (×2): 50 ug via INTRAVENOUS

## 2017-05-10 MED ORDER — OXYCODONE HCL 5 MG PO TABS
5.0000 mg | ORAL_TABLET | Freq: Once | ORAL | Status: DC | PRN
Start: 2017-05-10 — End: 2017-05-10

## 2017-05-10 MED ORDER — PROTAMINE SULFATE 10 MG/ML IV SOLN
INTRAVENOUS | Status: DC | PRN
  Administered 2017-05-10: 30 mg via INTRAVENOUS

## 2017-05-10 MED ORDER — MEPERIDINE HCL 25 MG/ML IJ SOLN: 6.2500 mg | INTRAMUSCULAR | Status: DC | PRN

## 2017-05-10 MED ORDER — FENTANYL CITRATE (PF) 100 MCG/2ML IJ SOLN
INTRAMUSCULAR | Status: AC
  Filled 2017-05-10: qty 2

## 2017-05-10 MED ORDER — BUPIVACAINE HCL (PF) 0.25 % IJ SOLN
INTRAMUSCULAR | Status: AC
  Filled 2017-05-10: qty 30

## 2017-05-10 MED ORDER — HEPARIN SODIUM (PORCINE) 1000 UNIT/ML IJ SOLN
INTRAMUSCULAR | Status: AC
  Filled 2017-05-10: qty 1

## 2017-05-10 MED ORDER — SODIUM CHLORIDE 0.9 % IV SOLN
INTRAVENOUS | Status: DC | PRN
  Administered 2017-05-10: 07:00:00 via INTRAVENOUS

## 2017-05-10 MED ORDER — SOTALOL HCL 120 MG PO TABS
120.0000 mg | ORAL_TABLET | Freq: Two times a day (BID) | ORAL | Status: DC
  Filled 2017-05-10: qty 1

## 2017-05-10 MED ORDER — DULOXETINE HCL 60 MG PO CPEP
60.0000 mg | ORAL_CAPSULE | Freq: Every day | ORAL | Status: DC
  Administered 2017-05-10: 60 mg via ORAL
  Filled 2017-05-10: qty 1

## 2017-05-10 MED ORDER — FENTANYL CITRATE (PF) 100 MCG/2ML IJ SOLN
INTRAMUSCULAR | Status: DC | PRN
  Administered 2017-05-10 (×3): 25 ug via INTRAVENOUS
  Administered 2017-05-10: 50 ug via INTRAVENOUS
  Administered 2017-05-10: 25 ug via INTRAVENOUS

## 2017-05-10 MED ORDER — SODIUM CHLORIDE 0.9% FLUSH: 3.0000 mL | Freq: Two times a day (BID) | INTRAVENOUS | Status: DC

## 2017-05-10 MED ORDER — HYDROCODONE-ACETAMINOPHEN 5-325 MG PO TABS
1.0000 | ORAL_TABLET | ORAL | Status: DC | PRN
  Administered 2017-05-10: 2 via ORAL
  Filled 2017-05-10: qty 2

## 2017-05-10 MED ORDER — HYDRALAZINE HCL 50 MG PO TABS
50.0000 mg | ORAL_TABLET | Freq: Two times a day (BID) | ORAL | Status: DC
  Administered 2017-05-10: 50 mg via ORAL
  Filled 2017-05-10: qty 1

## 2017-05-10 MED ORDER — IRBESARTAN 150 MG PO TABS: 150.0000 mg | ORAL_TABLET | Freq: Every day | ORAL | Status: DC

## 2017-05-10 MED ORDER — OXYCODONE HCL 5 MG/5ML PO SOLN: 5.0000 mg | Freq: Once | ORAL | Status: DC | PRN

## 2017-05-10 MED ORDER — INSULIN ASPART 100 UNIT/ML ~~LOC~~ SOLN: 0.0000 [IU] | Freq: Every day | SUBCUTANEOUS | Status: DC

## 2017-05-10 MED ORDER — KETOROLAC TROMETHAMINE 30 MG/ML IJ SOLN: 30.0000 mg | Freq: Once | INTRAMUSCULAR | Status: DC | PRN

## 2017-05-10 MED ORDER — HEPARIN SODIUM (PORCINE) 1000 UNIT/ML IJ SOLN
INTRAMUSCULAR | Status: DC | PRN
  Administered 2017-05-10: 3000 [IU] via INTRAVENOUS
  Administered 2017-05-10: 2000 [IU] via INTRAVENOUS
  Administered 2017-05-10: 1000 [IU] via INTRAVENOUS

## 2017-05-10 MED ORDER — RIVAROXABAN 20 MG PO TABS: 20.0000 mg | ORAL_TABLET | Freq: Every day | ORAL | Status: DC

## 2017-05-10 MED ORDER — HEPARIN SODIUM (PORCINE) 1000 UNIT/ML IJ SOLN
INTRAMUSCULAR | Status: DC | PRN
  Administered 2017-05-10: 1000 [IU] via INTRAVENOUS
  Administered 2017-05-10: 12000 [IU] via INTRAVENOUS

## 2017-05-10 MED ORDER — EPHEDRINE SULFATE-NACL 50-0.9 MG/10ML-% IV SOSY
PREFILLED_SYRINGE | INTRAVENOUS | Status: DC | PRN
  Administered 2017-05-10: 10 mg via INTRAVENOUS
  Administered 2017-05-10: 5 mg via INTRAVENOUS
  Administered 2017-05-10: 10 mg via INTRAVENOUS
  Administered 2017-05-10 (×2): 5 mg via INTRAVENOUS
  Administered 2017-05-10: 10 mg via INTRAVENOUS
  Administered 2017-05-10: 5 mg via INTRAVENOUS

## 2017-05-10 MED ORDER — ACETAMINOPHEN 325 MG PO TABS: 650.0000 mg | ORAL_TABLET | ORAL | Status: DC | PRN

## 2017-05-10 MED ORDER — MIDAZOLAM HCL 5 MG/5ML IJ SOLN
INTRAMUSCULAR | Status: DC | PRN
  Administered 2017-05-10: 2 mg via INTRAVENOUS

## 2017-05-10 MED ORDER — SODIUM CHLORIDE 0.9% FLUSH: 3.0000 mL | INTRAVENOUS | Status: DC | PRN

## 2017-05-10 MED ORDER — ISOPROTERENOL HCL 0.2 MG/ML IJ SOLN
INTRAVENOUS | Status: DC | PRN
  Administered 2017-05-10: 20 ug/min via INTRAVENOUS

## 2017-05-10 MED ORDER — ONDANSETRON HCL 4 MG/2ML IJ SOLN: 4.0000 mg | Freq: Once | INTRAMUSCULAR | Status: DC | PRN

## 2017-05-10 MED ORDER — LEVOTHYROXINE SODIUM 75 MCG PO TABS: 75.0000 ug | ORAL_TABLET | Freq: Every day | ORAL | Status: DC

## 2017-05-10 MED ORDER — IOPAMIDOL (ISOVUE-370) INJECTION 76%
INTRAVENOUS | Status: AC
  Filled 2017-05-10: qty 50

## 2017-05-10 MED ORDER — PHENYLEPHRINE 40 MCG/ML (10ML) SYRINGE FOR IV PUSH (FOR BLOOD PRESSURE SUPPORT)
PREFILLED_SYRINGE | INTRAVENOUS | Status: DC | PRN
  Administered 2017-05-10: 40 ug via INTRAVENOUS

## 2017-05-10 MED ORDER — SODIUM CHLORIDE 0.9 % IV SOLN: 250.0000 mL | INTRAVENOUS | Status: DC | PRN

## 2017-05-10 MED ORDER — ACETAMINOPHEN 160 MG/5ML PO SOLN: 325.0000 mg | ORAL | Status: DC | PRN

## 2017-05-10 MED ORDER — POTASSIUM CHLORIDE CRYS ER 20 MEQ PO TBCR: 20.0000 meq | EXTENDED_RELEASE_TABLET | Freq: Every day | ORAL | Status: DC

## 2017-05-10 MED ORDER — ONDANSETRON HCL 4 MG/2ML IJ SOLN
INTRAMUSCULAR | Status: DC | PRN
  Administered 2017-05-10: 4 mg via INTRAVENOUS

## 2017-05-10 MED ORDER — ACETAMINOPHEN 325 MG PO TABS: 325.0000 mg | ORAL_TABLET | ORAL | Status: DC | PRN

## 2017-05-10 MED ORDER — POTASSIUM CHLORIDE CRYS ER 20 MEQ PO TBCR: 40.0000 meq | EXTENDED_RELEASE_TABLET | Freq: Every day | ORAL | Status: DC

## 2017-05-10 MED ORDER — GLYCOPYRROLATE 0.2 MG/ML IV SOSY
PREFILLED_SYRINGE | INTRAVENOUS | Status: DC | PRN
  Administered 2017-05-10: .2 mg via INTRAVENOUS

## 2017-05-10 SURGICAL SUPPLY — 15 items
CATH MAPPNG PENTARAY F 2-6-2MM (CATHETERS) ×1 IMPLANT
CATH NAVISTAR SMARTTOUCH DF (ABLATOR) ×3 IMPLANT
CATH SOUNDSTAR 3D IMAGING (CATHETERS) ×3 IMPLANT
CATH WEBSTER BI DIR CS D-F CRV (CATHETERS) ×3 IMPLANT
NEEDLE TRANSEP BRK 71CM 407200 (NEEDLE) ×3 IMPLANT
PACK EP LATEX FREE (CUSTOM PROCEDURE TRAY) ×2
PACK EP LF (CUSTOM PROCEDURE TRAY) ×1 IMPLANT
PAD DEFIB LIFELINK (PAD) ×3 IMPLANT
PATCH CARTO3 (PAD) ×3 IMPLANT
PENTARAY F 2-6-2MM (CATHETERS) ×3
SHEATH AVANTI 11CM 7FR (SHEATH) ×6 IMPLANT
SHEATH AVANTI 11CM 9FR (SHEATH) ×3 IMPLANT
SHEATH AVANTI 11F 11CM (SHEATH) ×3 IMPLANT
SHEATH SWARTZ TS SL2 63CM 8.5F (SHEATH) ×3 IMPLANT
TUBING SMART ABLATE COOLFLOW (TUBING) ×3 IMPLANT

## 2017-05-10 NOTE — Anesthesia Procedure Notes (Signed)
Procedure Name: MAC Date/Time: 05/10/2017 7:31 AM Performed by: Wilburn Cornelia, CRNA Pre-anesthesia Checklist: Patient identified, Emergency Drugs available, Suction available, Patient being monitored and Timeout performed Patient Re-evaluated:Patient Re-evaluated prior to induction Oxygen Delivery Method: Simple face mask Placement Confirmation: CO2 detector,  positive ETCO2 and breath sounds checked- equal and bilateral Dental Injury: Teeth and Oropharynx as per pre-operative assessment

## 2017-05-10 NOTE — H&P (Signed)
Chief Complaint  Patient presents with  . Atrial Fibrillation     History of Present Illness: Ashley Woodard is a 67 y.o. female who presents today for electrophysiology study and ablation.  She reports being diagnosed with atrial fibrillation approximately 20 years ago.  In retrospect, she thinks that shes had symptoms since her 30s.  She has been on sotalol for years and has done well.  In December, she began having increasing frequency and duration of afib.  She is unaware of triggers. She was seen in the AF clinic and her sotalol was increased to 120mg  BID. Unfortunately, she continues to have increasing frequency and duration of atrial fibrillation.  Today, she denies symptoms of palpitations, chest pain, shortness of breath, orthopnea, PND, lower extremity edema, claudication, dizziness, presyncope, syncope, bleeding, or neurologic sequela. The patient is tolerating medications without difficulties and is otherwise without complaint today.        Past Medical History:  Diagnosis Date  . Abnormal cardiovascular stress test 08/21/2016   a. done for abnl EKG/cardiac risk factors -> admitted for cath 07/2016 which showed no significant CAD, normal LV contraction, mildly elevated filling pressure. Low dose Imdur was added for possible component of microvascular dysfunction.   Marland Kitchen. Anxiety   . Arthritis   . Colon polyps   . Deafness in right ear   . Depression   . Diabetes mellitus without complication (HCC)   . Diverticulosis   . Hypertension   . Hypertriglyceridemia   . Migraine   . Paroxysmal atrial fibrillation (HCC)   . S/P cardiac catheterization, 08/20/16 minimal CAD  08/21/2016  . Sleep apnea    mild per patient  . Thyroid disease   . Voice tremor         Past Surgical History:  Procedure Laterality Date  . ABDOMINAL HYSTERECTOMY  1988  . COCHLEAR IMPLANT  2008  . LEFT HEART CATH AND CORONARY ANGIOGRAPHY N/A 08/20/2016   Procedure: Left Heart Cath and  Coronary Angiography;  Surgeon: Yvonne KendallEnd, Christopher, MD;  Location: [facility];  Service: Cardiovascular;  Laterality: N/A;           Current Outpatient Medications  Medication Sig Dispense Refill  . amLODipine (NORVASC) 10 MG tablet TAKE 1 TABLET(10 MG) BY MOUTH DAILY 30 tablet 6  . Cholecalciferol (VITAMIN D PO) Take by mouth.    . diltiazem (CARDIZEM) 30 MG tablet Take 1 tablet every 4 hours AS NEEDED for AFIB heart rate over 100 45 tablet 1  . DULoxetine (CYMBALTA) 60 MG capsule Take 1 capsule by mouth daily. Take a sleep aide for sleep. She does not know the name  4  . glucose blood (CONTOUR NEXT TEST) test strip Check blood sugars daily 100 each 6  . hydrALAZINE (APRESOLINE) 50 MG tablet Take 1 tablet by mouth 2 (two) times daily.  7  . irbesartan (AVAPRO) 150 MG tablet Take 1 tablet (150 mg total) by mouth daily. 90 tablet 1  . LANCETS ULTRA THIN 30G MISC 1 Stick by Does not apply route daily. 100 each 6  . levothyroxine (SYNTHROID, LEVOTHROID) 75 MCG tablet Take 1 tablet by mouth daily.  2  . Magnesium 200 MG TABS Take 1 tablet by mouth daily.    . metFORMIN (GLUCOPHAGE) 500 MG tablet Take 1 tablet (500 mg total) by mouth 2 (two) times daily with a meal.  5  . Omega-3 Fatty Acids (FISH OIL) 1000 MG CAPS Take 1 capsule by mouth daily.    .Marland Kitchen  potassium chloride SA (K-DUR,KLOR-CON) 20 MEQ tablet take 2 tablets in the morning and 1 tablet in the afternoon 180 tablet 1  . simvastatin (ZOCOR) 20 MG tablet Take 1 tablet (20 mg total) by mouth 3 (three) times a week. 90 tablet 1  . sotalol (BETAPACE) 80 MG tablet Take 1.5 tablets (120 mg total) by mouth 2 (two) times daily. 90 tablet 4  . vitamin B-12 (CYANOCOBALAMIN) 1000 MCG tablet Take 1 tablet (1,000 mcg total) by mouth daily. 30 tablet 0  . XARELTO 20 MG TABS tablet Take 1 tablet by mouth at bedtime.   5  . hydrochlorothiazide (HYDRODIURIL) 25 MG tablet Take 1 tablet (25 mg total) by mouth daily. 90 tablet 1   No  current facility-administered medications for this visit.     Allergies:   Demeclocycline; Doxycycline; Erythromycin; Lisinopril; and Tetracyclines & related   Social History:  The patient  reports that  has never smoked. she has never used smokeless tobacco. She reports that she does not drink alcohol or use drugs.   Family History:  The patient's  family history includes Cancer in her brother, maternal aunt, maternal uncle, and sister; Dementia in her brother; Diabetes in her brother and mother; Heart attack in her brother, father, and mother; Hypertension in her brother, father, mother, and son; Stroke in her brother and father.    ROS:  Please see the history of present illness.   All other systems are personally reviewed and negative.    PHYSICAL EXAM: Vitals:   05/10/17 0545  BP: (!) 152/65  Pulse: (!) 56  Temp: 97.9 F (36.6 C)  SpO2: 98%   GEN: Well nourished, well developed, in no acute distress  HEENT: normal  Neck: no JVD, carotid bruits, or masses Cardiac: RRR; no murmurs, rubs, or gallops,no edema  Respiratory:  clear to auscultation bilaterally, normal work of breathing GI: soft, nontender, nondistended, + BS MS: no deformity or atrophy  Skin: warm and dry  Neuro:  Strength and sensation are intact Psych: euthymic mood, full affect       Wt Readings from Last 3 Encounters:  03/11/17 196 lb 4 oz (89 kg)  03/08/17 196 lb (88.9 kg)  02/24/17 195 lb (88.5 kg)     ASSESSMENT AND PLAN:  1.  Paroxysmal atrial fibrillation Episodes last < 7 days Has not previously required cardioversion Symptomatic with recent increasing episodes despite sotalol. Therapeutic strategies for afib including medicine and ablation were discussed in detail with the patient today. Risk, benefits, and alternatives to EP study and radiofrequency ablation for afib were also discussed in detail today. These risks include but are not limited to stroke, bleeding, vascular damage,  tamponade, perforation, damage to the esophagus, lungs, and other structures, pulmonary vein stenosis, worsening renal function, and death. The patient understands these risk and wishes to proceed.    chads2vasc score is currently 3.  She is on xarelto and reports compliance without interruption. Cardiac CT is reviewed and reveals no LAA thrombus.  Hillis Range MD, [facility] 05/10/2017 7:13 AM

## 2017-05-10 NOTE — Transfer of Care (Signed)
Immediate Anesthesia Transfer of Care Note  Patient: Ashley Woodard  Procedure(s) Performed: ATRIAL FIBRILLATION ABLATION (N/A )  Patient Location: Cath Lab  Anesthesia Type:MAC  Level of Consciousness: awake, alert  and oriented  Airway & Oxygen Therapy: Patient Spontanous Breathing and Patient connected to nasal cannula oxygen  Post-op Assessment: Report given to RN and Post -op Vital signs reviewed and stable  Post vital signs: Reviewed and stable  Last Vitals:  Vitals:   05/10/17 0545  BP: (!) 152/65  Pulse: (!) 56  Temp: 36.6 C  SpO2: 98%    Last Pain:  Vitals:   05/10/17 0545  TempSrc: Oral      Patients Stated Pain Goal: 3 (56/31/49 7026)  Complications: No apparent anesthesia complications

## 2017-05-10 NOTE — Anesthesia Postprocedure Evaluation (Signed)
Anesthesia Post Note  Patient: Arlyss Gandyikki R Verrilli  Procedure(s) Performed: ATRIAL FIBRILLATION ABLATION (N/A )     Patient location during evaluation: PACU Anesthesia Type: General Level of consciousness: awake and alert Pain management: pain level controlled Vital Signs Assessment: post-procedure vital signs reviewed and stable Respiratory status: spontaneous breathing, nonlabored ventilation, respiratory function stable and patient connected to nasal cannula oxygen Cardiovascular status: blood pressure returned to baseline and stable Postop Assessment: no apparent nausea or vomiting Anesthetic complications: no    Last Vitals:  Vitals:   05/10/17 1355 05/10/17 1436  BP: (!) 146/78 (!) 145/71  Pulse: 64   Resp: 16 18  Temp:  36.7 C  SpO2: 95% 96%    Last Pain:  Vitals:   05/10/17 1443  TempSrc:   PainSc: 7                  Aliha Diedrich

## 2017-05-10 NOTE — Progress Notes (Signed)
Discharge instructions (including medications) discussed with and copy provided to patient/caregiver 

## 2017-05-10 NOTE — Progress Notes (Signed)
Site area: Right groin a 7,9,and 11 french venous sheath was removed  Site Prior to Removal:  Level 0  Pressure Applied For 20 MINUTES    Bedrest Beginning at 1205p Manual:   Yes.    Patient Status During Pull:  stable  Post Pull Groin Site:  Level 0  Post Pull Instructions Given:  Yes.    Post Pull Pulses Present:  Yes.    Dressing Applied:  Yes.    Comments:

## 2017-05-10 NOTE — Progress Notes (Signed)
The patient was seen by Dr. Johney FrameAllred post procedure Telemetry is SR VSS Patient feels well and would like to go home tonight Activity restrictions, site care were discussed with the patient Routine follow up is in place Planned for discharge once bedrest is completed, after ambulation of patient and procedure site remain stable.  Francis Dowseenee Tresia Revolorio, PA-C

## 2017-05-11 LAB — POCT ACTIVATED CLOTTING TIME: Activated Clotting Time: 186 seconds

## 2017-05-11 MED FILL — Heparin Sodium (Porcine) 2 Unit/ML in Sodium Chloride 0.9%: INTRAMUSCULAR | Qty: 500 | Status: AC

## 2017-06-09 ENCOUNTER — Ambulatory Visit (HOSPITAL_COMMUNITY): Payer: BLUE CROSS/BLUE SHIELD | Admitting: Nurse Practitioner

## 2017-06-10 ENCOUNTER — Ambulatory Visit (HOSPITAL_COMMUNITY)
Admission: RE | Admit: 2017-06-10 | Discharge: 2017-06-10 | Disposition: A | Discharge status: Home / Self Care | Payer: Medicare Other | Source: Ambulatory Visit | Attending: Nurse Practitioner | Admitting: Nurse Practitioner

## 2017-06-10 ENCOUNTER — Encounter (HOSPITAL_COMMUNITY): Payer: Self-pay | Admitting: Nurse Practitioner

## 2017-06-10 VITALS — BP 118/64 | HR 65 | Ht 67.0 in | Wt 198.0 lb

## 2017-06-10 DIAGNOSIS — I480 Paroxysmal atrial fibrillation: Secondary | ICD-10-CM | POA: Diagnosis not present

## 2017-06-10 DIAGNOSIS — Z7901 Long term (current) use of anticoagulants: Secondary | ICD-10-CM | POA: Diagnosis not present

## 2017-06-10 DIAGNOSIS — F329 Major depressive disorder, single episode, unspecified: Secondary | ICD-10-CM | POA: Insufficient documentation

## 2017-06-10 DIAGNOSIS — Z79890 Hormone replacement therapy (postmenopausal): Secondary | ICD-10-CM | POA: Diagnosis not present

## 2017-06-10 DIAGNOSIS — G4730 Sleep apnea, unspecified: Secondary | ICD-10-CM | POA: Diagnosis not present

## 2017-06-10 DIAGNOSIS — Z86010 Personal history of colonic polyps: Secondary | ICD-10-CM | POA: Diagnosis not present

## 2017-06-10 DIAGNOSIS — E781 Pure hyperglyceridemia: Secondary | ICD-10-CM | POA: Insufficient documentation

## 2017-06-10 DIAGNOSIS — F419 Anxiety disorder, unspecified: Secondary | ICD-10-CM | POA: Diagnosis not present

## 2017-06-10 DIAGNOSIS — I2510 Atherosclerotic heart disease of native coronary artery without angina pectoris: Secondary | ICD-10-CM | POA: Diagnosis not present

## 2017-06-10 DIAGNOSIS — I10 Essential (primary) hypertension: Secondary | ICD-10-CM | POA: Insufficient documentation

## 2017-06-10 DIAGNOSIS — E039 Hypothyroidism, unspecified: Secondary | ICD-10-CM | POA: Diagnosis not present

## 2017-06-10 DIAGNOSIS — Z79899 Other long term (current) drug therapy: Secondary | ICD-10-CM | POA: Insufficient documentation

## 2017-06-10 DIAGNOSIS — K5790 Diverticulosis of intestine, part unspecified, without perforation or abscess without bleeding: Secondary | ICD-10-CM | POA: Diagnosis not present

## 2017-06-10 DIAGNOSIS — E119 Type 2 diabetes mellitus without complications: Secondary | ICD-10-CM | POA: Insufficient documentation

## 2017-06-10 DIAGNOSIS — Z7984 Long term (current) use of oral hypoglycemic drugs: Secondary | ICD-10-CM | POA: Insufficient documentation

## 2017-06-10 NOTE — Progress Notes (Signed)
Primary Care Physician: Ashley Woodard Referring Physician: Dr. Narda Amber Woodard Woodard is a 67 y.o. female with a h/o afib that is in the afib clinic for f/u ablation 3/19. She reports that she is doing well. Has been enjoying SR. Continues on sotalol. No swallowing or groin issues.   Today, she denies symptoms of palpitations, chest pain, shortness of breath, orthopnea, PND, lower extremity edema, dizziness, presyncope, syncope, or neurologic sequela. The patient is tolerating medications without difficulties and is otherwise without complaint today.   Past Medical History:  Diagnosis Date  . Abnormal cardiovascular stress test 08/21/2016   a. done for abnl EKG/cardiac risk factors -> admitted for cath 07/2016 which showed no significant CAD, normal LV contraction, mildly elevated filling pressure. Low dose Imdur was added for possible component of microvascular dysfunction.   Marland Kitchen Anxiety   . Arthritis    "back, knees, fingers" (05/10/2017)  . Chronic lower back pain   . Colon polyps   . Deafness in right ear   . Depression   . Diverticulosis   . Hypertension   . Hypertriglyceridemia   . Hypothyroidism   . Migraine    "none in the 2000s" (05/10/2017)  . Paroxysmal atrial fibrillation (HCC)   . Pneumonia    "several times" (05/10/2017)  . PONV (postoperative nausea and vomiting)   . S/P cardiac catheterization, 08/20/16 minimal CAD 08/21/2016  . Sleep apnea    mild per patient; "recently had another test and they said I don't have it" (05/10/2017)  . Type II diabetes mellitus (HCC)   . Voice tremor    Past Surgical History:  Procedure Laterality Date  . ABDOMINAL HYSTERECTOMY  1988  . ATRIAL FIBRILLATION ABLATION N/A 05/10/2017   Procedure: ATRIAL FIBRILLATION ABLATION;  Surgeon: Hillis Range, MD;  Location: [facility];  Service: Cardiovascular;  Laterality: N/A;  . COCHLEAR IMPLANT Right 2008  . LEFT HEART CATH AND CORONARY ANGIOGRAPHY N/A 08/20/2016   Procedure:  Left Heart Cath and Coronary Angiography;  Surgeon: Yvonne Kendall, MD;  Location: [facility];  Service: Cardiovascular;  Laterality: N/A;    Current Outpatient Medications  Medication Sig Dispense Refill  . amLODipine (NORVASC) 10 MG tablet TAKE 1 TABLET(10 MG) BY MOUTH DAILY 30 tablet 6  . Cholecalciferol (VITAMIN D PO) Take 1 capsule by mouth daily.     Marland Kitchen diltiazem (CARDIZEM) 30 MG tablet Take 1 tablet every 4 hours AS NEEDED for AFIB heart rate over 100 (Patient taking differently: Take 30 mg by mouth every 4 (four) hours as needed (for AFIB heart rate over 100). ) 45 tablet 1  . diphenhydrAMINE (BENADRYL) 25 mg capsule Take 25 mg by mouth at bedtime.    . DULoxetine (CYMBALTA) 60 MG capsule Take 60 mg by mouth daily.   4  . glucose blood (CONTOUR NEXT TEST) test strip Check blood sugars daily 100 each 6  . hydrALAZINE (APRESOLINE) 50 MG tablet Take 50 mg by mouth 2 (two) times daily.   7  . hydrochlorothiazide (HYDRODIURIL) 25 MG tablet TAKE 1 TABLET(25 MG) BY MOUTH DAILY 90 tablet 3  . irbesartan (AVAPRO) 150 MG tablet TAKE 1 TABLET(150 MG) BY MOUTH DAILY 90 tablet 3  . LANCETS ULTRA THIN 30G MISC 1 Stick by Does not apply route daily. 100 each 6  . levothyroxine (SYNTHROID, LEVOTHROID) 75 MCG tablet Take 75 mcg by mouth daily.   2  . Magnesium 200 MG TABS Take 200 mg by mouth  daily.     . metFORMIN (GLUCOPHAGE) 500 MG tablet Take 1 tablet (500 mg total) by mouth 2 (two) times daily with a meal.  5  . Omega-3 Fatty Acids (FISH OIL) 1000 MG CAPS Take 1,000 mg by mouth daily.     . pantoprazole (PROTONIX) 40 MG tablet Take 1 tablet (40 mg total) by mouth daily. 45 tablet 0  . potassium chloride SA (K-DUR,KLOR-CON) 20 MEQ tablet take 2 tablets in the morning and 1 tablet in the afternoon (Patient taking differently: Take 20-40 mEq by mouth See admin instructions. Take 40 MEQ by mouth in the morning and take 20 MEQ by mouth at bedtime) 180 tablet 1  . simvastatin (ZOCOR) 20 MG tablet  Take 1 tablet (20 mg total) by mouth 3 (three) times a week. (Patient taking differently: Take 20 mg by mouth every Monday, Wednesday, and Friday. ) 90 tablet 1  . sotalol (BETAPACE) 80 MG tablet Take 1.5 tablets (120 mg total) by mouth 2 (two) times daily. 90 tablet 4  . vitamin B-12 (CYANOCOBALAMIN) 1000 MCG tablet Take 1 tablet (1,000 mcg total) by mouth daily. 30 tablet 0  . XARELTO 20 MG TABS tablet Take 20 mg by mouth at bedtime.   5   No current facility-administered medications for this encounter.     Allergies  Allergen Reactions  . Demeclocycline Rash  . Doxycycline Rash  . Erythromycin Rash  . Lisinopril Cough  . Tetracyclines & Related Rash    Social History   Socioeconomic History  . Marital status: Married    Spouse name: Not on file  . Number of children: 2  . Years of education: 2012  . Highest education level: Not on file  Occupational History  . Occupation: Art therapistGeneral Manager  Social Needs  . Financial resource strain: Not on file  . Food insecurity:    Worry: Not on file    Inability: Not on file  . Transportation needs:    Medical: Not on file    Non-medical: Not on file  Tobacco Use  . Smoking status: Never Smoker  . Smokeless tobacco: Never Used  Substance and Sexual Activity  . Alcohol use: No  . Drug use: No  . Sexual activity: Not Currently  Lifestyle  . Physical activity:    Days per week: Not on file    Minutes per session: Not on file  . Stress: Not on file  Relationships  . Social connections:    Talks on phone: Not on file    Gets together: Not on file    Attends religious service: Not on file    Active member of club or organization: Not on file    Attends meetings of clubs or organizations: Not on file    Relationship status: Not on file  . Intimate partner violence:    Fear of current or ex partner: Not on file    Emotionally abused: Not on file    Physically abused: Not on file    Forced sexual activity: Not on file  Other  Topics Concern  . Not on file  Social History Narrative   Lives at home with husband in [place].   Right-handed.   No caffeine use.   Manages a call center    Family History  Problem Relation Age of Onset  . Hypertension Mother   . Diabetes Mother   . Heart attack Mother   . Hypertension Father   . Heart attack Father   . Stroke  Father   . Cancer Sister        Breast  . Hypertension Brother   . Cancer Brother        Prostate  . Diabetes Brother   . Dementia Brother   . Stroke Brother   . Heart attack Brother   . Hypertension Son   . Cancer Maternal Aunt        Breast  . Cancer Maternal Uncle        Lung    ROS- All systems are reviewed and negative except as per the HPI above  Physical Exam: Vitals:   06/10/17 0830  BP: 118/64  Pulse: 65  Weight: 198 lb (89.8 kg)  Height: 5\' 7"  (1.702 m)   Wt Readings from Last 3 Encounters:  06/10/17 198 lb (89.8 kg)  05/10/17 203 lb (92.1 kg)  04/18/17 197 lb 8 oz (89.6 kg)    Labs: Lab Results  Component Value Date   NA 142 04/27/2017   K 4.2 04/27/2017   CL 103 04/27/2017   CO2 24 04/27/2017   GLUCOSE 158 (H) 04/27/2017   BUN 18 04/27/2017   CREATININE 0.92 04/27/2017   CALCIUM 9.3 04/27/2017   MG 2.2 02/24/2017   Lab Results  Component Value Date   INR 1.17 08/20/2016   Lab Results  Component Value Date   CHOL 129 04/12/2016   HDL 37 04/12/2016   LDLCALC 49 04/12/2016   TRIG 429 (A) 04/12/2016     GEN- The patient is well appearing, alert and oriented x 3 today.   Head- normocephalic, atraumatic Eyes-  Sclera clear, conjunctiva pink Ears- hearing intact Oropharynx- clear Neck- supple, no JVP Lymph- no cervical lymphadenopathy Lungs- Clear to ausculation bilaterally, normal work of breathing Heart- Regular rate and rhythm, no murmurs, rubs or gallops, PMI not laterally displaced GI- soft, NT, ND, + BS Extremities- no clubbing, cyanosis, or edema MS- no significant deformity or atrophy Skin- no  rash or lesion Psych- euthymic mood, full affect Neuro- strength and sensation are intact  EKG-NSR at 60, print 158 ms, qrs int 88 ms, qtc 430 ms Epic records reviewed   Assessment and Plan: 1.Paroxysmal afib Doing well s/p ablation  Maintaining SR Continue sotalol 120 mg bid Resume normal activities  2. Chadsvasc score of at least 4 Reminded not to miss any anticoagulation for the full 3 month healing period Continue xarelto 20 mg daily  3. HTN Stable  F/u with Dr. Johney Frame 6/24   [facility] [address] [place], [place] [address] [phone]

## 2017-06-15 ENCOUNTER — Other Ambulatory Visit: Payer: Self-pay | Admitting: Emergency Medicine

## 2017-06-15 DIAGNOSIS — E119 Type 2 diabetes mellitus without complications: Secondary | ICD-10-CM

## 2017-06-15 MED ORDER — LANCETS ULTRA THIN 30G MISC: 1.0000 | Freq: Every day | 6 refills | Status: DC

## 2017-06-15 MED ORDER — GLUCOSE BLOOD VI STRP: ORAL_STRIP | 6 refills | Status: DC

## 2017-06-20 ENCOUNTER — Other Ambulatory Visit: Payer: Self-pay

## 2017-06-20 ENCOUNTER — Ambulatory Visit (INDEPENDENT_AMBULATORY_CARE_PROVIDER_SITE_OTHER): Payer: Medicare Other | Admitting: Physician Assistant

## 2017-06-20 ENCOUNTER — Encounter: Payer: Self-pay | Admitting: Physician Assistant

## 2017-06-20 VITALS — BP 108/70 | HR 56 | Temp 97.3°F | Resp 14 | Ht 67.0 in | Wt 200.0 lb

## 2017-06-20 DIAGNOSIS — E7849 Other hyperlipidemia: Secondary | ICD-10-CM | POA: Diagnosis not present

## 2017-06-20 DIAGNOSIS — E1100 Type 2 diabetes mellitus with hyperosmolarity without nonketotic hyperglycemic-hyperosmolar coma (NKHHC): Secondary | ICD-10-CM | POA: Diagnosis not present

## 2017-06-20 DIAGNOSIS — E039 Hypothyroidism, unspecified: Secondary | ICD-10-CM

## 2017-06-20 DIAGNOSIS — F329 Major depressive disorder, single episode, unspecified: Secondary | ICD-10-CM

## 2017-06-20 DIAGNOSIS — R4589 Other symptoms and signs involving emotional state: Secondary | ICD-10-CM

## 2017-06-20 DIAGNOSIS — Z794 Long term (current) use of insulin: Secondary | ICD-10-CM

## 2017-06-20 MED ORDER — DULOXETINE HCL 30 MG PO CPEP: 30.0000 mg | ORAL_CAPSULE | Freq: Every day | ORAL | 0 refills | Status: DC

## 2017-06-20 NOTE — Assessment & Plan Note (Signed)
Repeat TSH levels today. 

## 2017-06-20 NOTE — Progress Notes (Signed)
History of Present Illness: Patient is a 67 y.o. female who presents to clinic today for follow-up of Diabetes Mellitus II, controlled.  Patient currently on medication regimen of Metformin 500 mg BID.  Is taking medications as directed. Endorses working harder on diet -- watching carb counts. Notes she has a sweet tooth.  Was released by her specialist to start exercising again. Is going to be starting in the next week. . Is checking blood glucose as directed. Notes them averaging 130s.   Latest Maintenance: A1C --  Lab Results  Component Value Date   HGBA1C 6.4 02/24/2017   Diabetic Eye Exam -- Due. No history of retinopathy. Patient sees [facility]. . Urine Microalbumin -- On ARB therapy. Foot Exam -- Due. Denies concerns today.   Patient is also due for repeat TSH. Is taking her levothyroxine as directed.   Patient would also like to wean off of her Cymbalta. Mood has been stable.    Past Medical History:  Diagnosis Date  . Abnormal cardiovascular stress test 08/21/2016   a. done for abnl EKG/cardiac risk factors -> admitted for cath 07/2016 which showed no significant CAD, normal LV contraction, mildly elevated filling pressure. Low dose Imdur was added for possible component of microvascular dysfunction.   Marland Kitchen Anxiety   . Arthritis    "back, knees, fingers" (05/10/2017)  . Chronic lower back pain   . Colon polyps   . Deafness in right ear   . Depression   . Diverticulosis   . Hypertension   . Hypertriglyceridemia   . Hypothyroidism   . Migraine    "none in the 2000s" (05/10/2017)  . Paroxysmal atrial fibrillation (HCC)   . Pneumonia    "several times" (05/10/2017)  . PONV (postoperative nausea and vomiting)   . S/P cardiac catheterization, 08/20/16 minimal CAD 08/21/2016  . Sleep apnea    mild per patient; "recently had another test and they said I don't have it" (05/10/2017)  . Type II diabetes mellitus (HCC)   . Voice tremor     Current Outpatient Medications on  File Prior to Visit  Medication Sig Dispense Refill  . amLODipine (NORVASC) 10 MG tablet TAKE 1 TABLET(10 MG) BY MOUTH DAILY 30 tablet 6  . Cholecalciferol (VITAMIN D PO) Take 1 capsule by mouth daily.     . diphenhydrAMINE (BENADRYL) 25 mg capsule Take 25 mg by mouth at bedtime.    Marland Kitchen glucose blood (CONTOUR NEXT TEST) test strip Check blood sugars once daily 100 each 6  . hydrALAZINE (APRESOLINE) 50 MG tablet Take 50 mg by mouth 2 (two) times daily.   7  . hydrochlorothiazide (HYDRODIURIL) 25 MG tablet TAKE 1 TABLET(25 MG) BY MOUTH DAILY 90 tablet 3  . irbesartan (AVAPRO) 150 MG tablet TAKE 1 TABLET(150 MG) BY MOUTH DAILY 90 tablet 3  . LANCETS ULTRA THIN 30G MISC 1 Stick by Does not apply route daily. 100 each 6  . levothyroxine (SYNTHROID, LEVOTHROID) 75 MCG tablet Take 75 mcg by mouth daily.   2  . Magnesium 200 MG TABS Take 200 mg by mouth daily.     . metFORMIN (GLUCOPHAGE) 500 MG tablet Take 1 tablet (500 mg total) by mouth 2 (two) times daily with a meal.  5  . Omega-3 Fatty Acids (FISH OIL) 1000 MG CAPS Take 1,000 mg by mouth daily.     . potassium chloride SA (K-DUR,KLOR-CON) 20 MEQ tablet take 2 tablets in the morning and 1 tablet in the afternoon (Patient  taking differently: Take 20-40 mEq by mouth See admin instructions. Take 40 MEQ by mouth in the morning and take 20 MEQ by mouth at bedtime) 180 tablet 1  . simvastatin (ZOCOR) 20 MG tablet Take 1 tablet (20 mg total) by mouth 3 (three) times a week. (Patient taking differently: Take 20 mg by mouth every Monday, Wednesday, and Friday. ) 90 tablet 1  . sotalol (BETAPACE) 80 MG tablet Take 1.5 tablets (120 mg total) by mouth 2 (two) times daily. 90 tablet 4  . vitamin B-12 (CYANOCOBALAMIN) 1000 MCG tablet Take 1 tablet (1,000 mcg total) by mouth daily. 30 tablet 0  . XARELTO 20 MG TABS tablet Take 20 mg by mouth at bedtime.   5  . diltiazem (CARDIZEM) 30 MG tablet Take 1 tablet every 4 hours AS NEEDED for AFIB heart rate over 100  (Patient not taking: Reported on 06/20/2017) 45 tablet 1   No current facility-administered medications on file prior to visit.     Allergies  Allergen Reactions  . Demeclocycline Rash  . Doxycycline Rash  . Erythromycin Rash  . Lisinopril Cough  . Tetracyclines & Related Rash    Family History  Problem Relation Age of Onset  . Hypertension Mother   . Diabetes Mother   . Heart attack Mother   . Hypertension Father   . Heart attack Father   . Stroke Father   . Cancer Sister        Breast  . Hypertension Brother   . Cancer Brother        Prostate  . Diabetes Brother   . Dementia Brother   . Stroke Brother   . Heart attack Brother   . Hypertension Son   . Cancer Maternal Aunt        Breast  . Cancer Maternal Uncle        Lung    Social History   Socioeconomic History  . Marital status: Married    Spouse name: Not on file  . Number of children: 2  . Years of education: 71  . Highest education level: Not on file  Occupational History  . Occupation: Art therapist  Social Needs  . Financial resource strain: Not on file  . Food insecurity:    Worry: Not on file    Inability: Not on file  . Transportation needs:    Medical: Not on file    Non-medical: Not on file  Tobacco Use  . Smoking status: Never Smoker  . Smokeless tobacco: Never Used  Substance and Sexual Activity  . Alcohol use: No  . Drug use: No  . Sexual activity: Not Currently  Lifestyle  . Physical activity:    Days per week: Not on file    Minutes per session: Not on file  . Stress: Not on file  Relationships  . Social connections:    Talks on phone: Not on file    Gets together: Not on file    Attends religious service: Not on file    Active member of club or organization: Not on file    Attends meetings of clubs or organizations: Not on file    Relationship status: Not on file  Other Topics Concern  . Not on file  Social History Narrative   Lives at home with husband in [place].    Right-handed.   No caffeine use.   Manages a call center   Review of Systems: Pertinent ROS are listed in HPI  Physical Examination: BP  108/70   Pulse (!) 56   Temp (!) 97.3 F (36.3 C) (Oral)   Resp 14   Ht  (1.702 m)   Wt 200 lb (90.7 kg)   SpO2 98%   BMI 31.32 kg/m  General appearance: alert, cooperative, appears stated age and no distress Head: Normocephalic, without obvious abnormality, atraumatic Lungs: clear to auscultation bilaterally Heart: regular rate and rhythm, S1, S2 normal, no murmur, click, rub or gallop Extremities: extremities normal, atraumatic, no cyanosis or edema Pulses: 2+ and symmetric Skin: Skin color, texture, turgor normal. No rashes or lesions  Diabetic Foot Exam - Simple   Simple Foot Form Diabetic Foot exam was performed with the following findings:  Yes 06/20/2017  4:42 PM  Visual Inspection No deformities, no ulcerations, no other skin breakdown bilaterally:  Yes Sensation Testing Intact to touch and monofilament testing bilaterally:  Yes Pulse Check Posterior Tibialis and Dorsalis pulse intact bilaterally:  Yes Comments    Assessment/Plan: Hypothyroidism Repeat TSH levels today.   Diabetes mellitus, type 2 (HCC) Taking medications as directed. Last A1C at 6.4. Repeat today along with lipids and CMP. On ARB therapy. BP stable. Foot exam updated. No abnormal findings. Is scheduled dilated eye exam with her specialist. She will have them send records.   Hyperlipemia Repeat lipids and LFTs today.   Depressed mood Stable. Wanting to wean. Will alternate 60 mg and 30 mg every other day for 1 week. Then decrease to 30 mg daily x 1 week. Will then decrease to 30 mg QOD for 1 week before stopping.

## 2017-06-20 NOTE — Patient Instructions (Signed)
Please go to the lab today for blood work.  I will call you with your results. We will alter treatment regimen(s) if indicated by your results.   Please continue all chronic medications with the following exception: - For the Cymbalta, take daily alternating the 60 mg capsule and the 30 mg capsule every other day x 1 week. Then decrease to 30 mg daily x 1 week. Then 30 mg every other day x 1 week before stopping.  Let me know if you have any issue.

## 2017-06-20 NOTE — Assessment & Plan Note (Signed)
Repeat lipids and LFTs today.

## 2017-06-20 NOTE — Progress Notes (Deleted)
History of Present Illness: Patient is a 67 y.o. female who presents to clinic today for follow-up of Diabetes Mellitus II, controlled. ***.  Patient currently on medication regimen of Metformin 500 mg BID.  *** taking medications as directed. Endorses ***.  Denies ***. *** checking blood glucose as directed. ***.   Latest Maintenance: A1C --  Lab Results  Component Value Date   HGBA1C 6.4 02/24/2017   Diabetic Eye Exam -- Due. ***. Urine Microalbumin -- On ARB therapy.  Foot Exam -- Due. ***.   Patient is due for repeat TSH for hypothyroidism. Endorses taking her medication as directed. ***.   Past Medical History:  Diagnosis Date  . Abnormal cardiovascular stress test 08/21/2016   a. done for abnl EKG/cardiac risk factors -> admitted for cath 07/2016 which showed no significant CAD, normal LV contraction, mildly elevated filling pressure. Low dose Imdur was added for possible component of microvascular dysfunction.   Marland Kitchen Anxiety   . Arthritis    "back, knees, fingers" (05/10/2017)  . Chronic lower back pain   . Colon polyps   . Deafness in right ear   . Depression   . Diverticulosis   . Hypertension   . Hypertriglyceridemia   . Hypothyroidism   . Migraine    "none in the 2000s" (05/10/2017)  . Paroxysmal atrial fibrillation (HCC)   . Pneumonia    "several times" (05/10/2017)  . PONV (postoperative nausea and vomiting)   . S/P cardiac catheterization, 08/20/16 minimal CAD 08/21/2016  . Sleep apnea    mild per patient; "recently had another test and they said I don't have it" (05/10/2017)  . Type II diabetes mellitus (HCC)   . Voice tremor     Current Outpatient Medications on File Prior to Visit  Medication Sig Dispense Refill  . amLODipine (NORVASC) 10 MG tablet TAKE 1 TABLET(10 MG) BY MOUTH DAILY 30 tablet 6  . Cholecalciferol (VITAMIN D PO) Take 1 capsule by mouth daily.     Marland Kitchen diltiazem (CARDIZEM) 30 MG tablet Take 1 tablet every 4 hours AS NEEDED for AFIB heart rate  over 100 (Patient taking differently: Take 30 mg by mouth every 4 (four) hours as needed (for AFIB heart rate over 100). ) 45 tablet 1  . diphenhydrAMINE (BENADRYL) 25 mg capsule Take 25 mg by mouth at bedtime.    . DULoxetine (CYMBALTA) 60 MG capsule Take 60 mg by mouth daily.   4  . glucose blood (CONTOUR NEXT TEST) test strip Check blood sugars once daily 100 each 6  . hydrALAZINE (APRESOLINE) 50 MG tablet Take 50 mg by mouth 2 (two) times daily.   7  . hydrochlorothiazide (HYDRODIURIL) 25 MG tablet TAKE 1 TABLET(25 MG) BY MOUTH DAILY 90 tablet 3  . irbesartan (AVAPRO) 150 MG tablet TAKE 1 TABLET(150 MG) BY MOUTH DAILY 90 tablet 3  . LANCETS ULTRA THIN 30G MISC 1 Stick by Does not apply route daily. 100 each 6  . levothyroxine (SYNTHROID, LEVOTHROID) 75 MCG tablet Take 75 mcg by mouth daily.   2  . Magnesium 200 MG TABS Take 200 mg by mouth daily.     . metFORMIN (GLUCOPHAGE) 500 MG tablet Take 1 tablet (500 mg total) by mouth 2 (two) times daily with a meal.  5  . Omega-3 Fatty Acids (FISH OIL) 1000 MG CAPS Take 1,000 mg by mouth daily.     . pantoprazole (PROTONIX) 40 MG tablet Take 1 tablet (40 mg total) by mouth daily. 45 tablet  0  . potassium chloride SA (K-DUR,KLOR-CON) 20 MEQ tablet take 2 tablets in the morning and 1 tablet in the afternoon (Patient taking differently: Take 20-40 mEq by mouth See admin instructions. Take 40 MEQ by mouth in the morning and take 20 MEQ by mouth at bedtime) 180 tablet 1  . simvastatin (ZOCOR) 20 MG tablet Take 1 tablet (20 mg total) by mouth 3 (three) times a week. (Patient taking differently: Take 20 mg by mouth every Monday, Wednesday, and Friday. ) 90 tablet 1  . sotalol (BETAPACE) 80 MG tablet Take 1.5 tablets (120 mg total) by mouth 2 (two) times daily. 90 tablet 4  . vitamin B-12 (CYANOCOBALAMIN) 1000 MCG tablet Take 1 tablet (1,000 mcg total) by mouth daily. 30 tablet 0  . XARELTO 20 MG TABS tablet Take 20 mg by mouth at bedtime.   5   No current  facility-administered medications on file prior to visit.     Allergies  Allergen Reactions  . Demeclocycline Rash  . Doxycycline Rash  . Erythromycin Rash  . Lisinopril Cough  . Tetracyclines & Related Rash    Family History  Problem Relation Age of Onset  . Hypertension Mother   . Diabetes Mother   . Heart attack Mother   . Hypertension Father   . Heart attack Father   . Stroke Father   . Cancer Sister        Breast  . Hypertension Brother   . Cancer Brother        Prostate  . Diabetes Brother   . Dementia Brother   . Stroke Brother   . Heart attack Brother   . Hypertension Son   . Cancer Maternal Aunt        Breast  . Cancer Maternal Uncle        Lung    Social History   Socioeconomic History  . Marital status: Married    Spouse name: Not on file  . Number of children: 2  . Years of education: 103  . Highest education level: Not on file  Occupational History  . Occupation: Art therapist  Social Needs  . Financial resource strain: Not on file  . Food insecurity:    Worry: Not on file    Inability: Not on file  . Transportation needs:    Medical: Not on file    Non-medical: Not on file  Tobacco Use  . Smoking status: Never Smoker  . Smokeless tobacco: Never Used  Substance and Sexual Activity  . Alcohol use: No  . Drug use: No  . Sexual activity: Not Currently  Lifestyle  . Physical activity:    Days per week: Not on file    Minutes per session: Not on file  . Stress: Not on file  Relationships  . Social connections:    Talks on phone: Not on file    Gets together: Not on file    Attends religious service: Not on file    Active member of club or organization: Not on file    Attends meetings of clubs or organizations: Not on file    Relationship status: Not on file  Other Topics Concern  . Not on file  Social History Narrative   Lives at home with husband in [place].   Right-handed.   No caffeine use.   Manages a call center    Review  of Systems: Pertinent ROS are listed in HPI  Physical Examination: {Exam; complete female:17872}  Diabetic Foot Exam -  Simple   No data filed       Assessment/Plan: No problem-specific Assessment & Plan notes found for this encounter.

## 2017-06-20 NOTE — Assessment & Plan Note (Signed)
Taking medications as directed. Last A1C at 6.4. Repeat today along with lipids and CMP. On ARB therapy. BP stable. Foot exam updated. No abnormal findings. Is scheduled dilated eye exam with her specialist. She will have them send records.

## 2017-06-20 NOTE — Assessment & Plan Note (Signed)
Stable. Wanting to wean. Will alternate 60 mg and 30 mg every other day for 1 week. Then decrease to 30 mg daily x 1 week. Will then decrease to 30 mg QOD for 1 week before stopping.

## 2017-06-21 ENCOUNTER — Other Ambulatory Visit: Payer: Self-pay

## 2017-06-21 MED ORDER — XARELTO 20 MG PO TABS: 20.0000 mg | ORAL_TABLET | Freq: Every day | ORAL | 11 refills | Status: DC

## 2017-06-21 NOTE — Telephone Encounter (Signed)
Xarelto  refill request received; pt is 67 yrs old, wt-90.7kg, Crea-0.92 on 04/27/17, last seen by Rudi Coco on 06/10/17, CrCl-86.62ml/min; will send refill to requested pharmacy.

## 2017-06-21 NOTE — Addendum Note (Signed)
Addended by: Lenis Dickinson on: 06/21/2017 08:00 AM   Modules accepted: Orders

## 2017-06-23 ENCOUNTER — Telehealth: Payer: Self-pay | Admitting: Cardiology

## 2017-06-23 ENCOUNTER — Other Ambulatory Visit (INDEPENDENT_AMBULATORY_CARE_PROVIDER_SITE_OTHER): Payer: Medicare Other

## 2017-06-23 DIAGNOSIS — E7849 Other hyperlipidemia: Secondary | ICD-10-CM | POA: Diagnosis not present

## 2017-06-23 DIAGNOSIS — F329 Major depressive disorder, single episode, unspecified: Secondary | ICD-10-CM | POA: Diagnosis not present

## 2017-06-23 DIAGNOSIS — E039 Hypothyroidism, unspecified: Secondary | ICD-10-CM

## 2017-06-23 DIAGNOSIS — Z794 Long term (current) use of insulin: Secondary | ICD-10-CM | POA: Diagnosis not present

## 2017-06-23 DIAGNOSIS — E1100 Type 2 diabetes mellitus with hyperosmolarity without nonketotic hyperglycemic-hyperosmolar coma (NKHHC): Secondary | ICD-10-CM

## 2017-06-23 DIAGNOSIS — R4589 Other symptoms and signs involving emotional state: Secondary | ICD-10-CM

## 2017-06-23 LAB — LIPID PANEL
CHOLESTEROL: 108 mg/dL (ref 0–200)
HDL: 36.3 mg/dL — ABNORMAL LOW (ref >39.00)
LDL Cholesterol: 41 mg/dL (ref 0–99)
NonHDL: 71.51
TRIGLYCERIDES: 152 mg/dL — AB (ref 0.0–149.0)
Total CHOL/HDL Ratio: 3
VLDL: 30.4 mg/dL (ref 0.0–40.0)

## 2017-06-23 LAB — COMPREHENSIVE METABOLIC PANEL
ALBUMIN: 3.9 g/dL (ref 3.5–5.2)
ALK PHOS: 60 U/L (ref 39–117)
ALT: 16 U/L (ref 0–35)
AST: 15 U/L (ref 0–37)
BUN: 25 mg/dL — AB (ref 6–23)
CHLORIDE: 105 meq/L (ref 96–112)
CO2: 26 mEq/L (ref 19–32)
CREATININE: 0.87 mg/dL (ref 0.40–1.20)
Calcium: 9.5 mg/dL (ref 8.4–10.5)
GFR: 69.21 mL/min (ref >60.00)
GLUCOSE: 126 mg/dL — AB (ref 70–99)
Potassium: 3.7 mEq/L (ref 3.5–5.1)
SODIUM: 141 meq/L (ref 135–145)
TOTAL PROTEIN: 6.3 g/dL (ref 6.0–8.3)
Total Bilirubin: 0.3 mg/dL (ref 0.2–1.2)

## 2017-06-23 LAB — HEMOGLOBIN A1C: Hgb A1c MFr Bld: 6.3 % (ref 4.6–6.5)

## 2017-06-23 LAB — TSH: TSH: 3.89 u[IU]/mL (ref 0.35–4.50)

## 2017-06-23 NOTE — Telephone Encounter (Signed)
Pt c/o medication issue:  1. Name of Medication:  XARELTO 20 MG TABS tablet    2. How are you currently taking this medication (dosage and times per day)? Take 1 tablet (20 mg total) by mouth at bedtime.  3. Are you having a reaction (difficulty breathing--STAT)? no  4. What is your medication issue? Due to the price she want to take Warfrin

## 2017-06-24 MED ORDER — WARFARIN SODIUM 5 MG PO TABS: 5.0000 mg | ORAL_TABLET | Freq: Every day | ORAL | 3 refills | Status: DC

## 2017-06-24 NOTE — Telephone Encounter (Signed)
Call returned to Pt.  Pt aware that changing to warfarin requires addl monitoring.  Will get additional information and f/u.

## 2017-06-24 NOTE — Telephone Encounter (Signed)
Pt to change from Xarelto 20 mg daily to warfarin.  Notified Pt she should take Xarelto 20 mg with the warfarin (5 mg) for the first 3 days, then just take the warfarin.  Pt indicates understanding. Appt made for 07/01/2017 at 2:30 pm for new coumadin visit.

## 2017-07-01 ENCOUNTER — Ambulatory Visit (INDEPENDENT_AMBULATORY_CARE_PROVIDER_SITE_OTHER): Payer: Medicare Other | Admitting: *Deleted

## 2017-07-01 DIAGNOSIS — I481 Persistent atrial fibrillation: Secondary | ICD-10-CM

## 2017-07-01 DIAGNOSIS — Z5181 Encounter for therapeutic drug level monitoring: Secondary | ICD-10-CM | POA: Diagnosis not present

## 2017-07-01 DIAGNOSIS — I4819 Other persistent atrial fibrillation: Secondary | ICD-10-CM

## 2017-07-01 LAB — POCT INR: INR: 1.3

## 2017-07-01 NOTE — Patient Instructions (Addendum)
A full discussion of the nature of anticoagulants has been carried out.  A benefit risk analysis has been presented to the patient, so that they understand the justification for choosing anticoagulation at this time. The need for frequent and regular monitoring, precise dosage adjustment and compliance is stressed.  Side effects of potential bleeding are discussed.  The patient should avoid any OTC items containing aspirin or ibuprofen, and should avoid great swings in general diet.  Avoid alcohol consumption.  Call if any signs of abnormal bleeding.   Description   Change your dose to 1 tablet daily except 1.5 tablets on Mondays and Fridays. Recheck in one week. Call us with any medication changes or concerns, Coumadin clinic # [phone]. Main #  [phone].

## 2017-07-08 ENCOUNTER — Ambulatory Visit (INDEPENDENT_AMBULATORY_CARE_PROVIDER_SITE_OTHER): Payer: Medicare Other | Admitting: *Deleted

## 2017-07-08 DIAGNOSIS — Z5181 Encounter for therapeutic drug level monitoring: Secondary | ICD-10-CM

## 2017-07-08 DIAGNOSIS — I481 Persistent atrial fibrillation: Secondary | ICD-10-CM | POA: Diagnosis not present

## 2017-07-08 DIAGNOSIS — I4819 Other persistent atrial fibrillation: Secondary | ICD-10-CM

## 2017-07-08 LAB — POCT INR: INR: 2.7

## 2017-07-08 NOTE — Patient Instructions (Signed)
Description   Continue taking 1 tablet daily except 1.5 tablets on Mondays and Fridays. Recheck in one week. Call us with any medication changes or concerns, Coumadin clinic # [phone]. Main #  [phone].

## 2017-07-18 ENCOUNTER — Other Ambulatory Visit: Payer: Self-pay | Admitting: Physician Assistant

## 2017-07-19 ENCOUNTER — Ambulatory Visit (INDEPENDENT_AMBULATORY_CARE_PROVIDER_SITE_OTHER): Payer: Medicare Other | Admitting: *Deleted

## 2017-07-19 DIAGNOSIS — I481 Persistent atrial fibrillation: Secondary | ICD-10-CM | POA: Diagnosis not present

## 2017-07-19 DIAGNOSIS — Z5181 Encounter for therapeutic drug level monitoring: Secondary | ICD-10-CM

## 2017-07-19 DIAGNOSIS — I480 Paroxysmal atrial fibrillation: Secondary | ICD-10-CM

## 2017-07-19 DIAGNOSIS — I4819 Other persistent atrial fibrillation: Secondary | ICD-10-CM

## 2017-07-19 LAB — POCT INR: INR: 3.9 — AB (ref 2.0–3.0)

## 2017-07-19 NOTE — Telephone Encounter (Signed)
Last OV 06/20/17, Next OV 12/26/17  Last filled 06/20/17, # 30 with 0 refills  Note from last OV that patient is weaning off of this medication.

## 2017-07-19 NOTE — Patient Instructions (Signed)
Description   Do not take coumadin today then change dose of coumadin to  1 tablet daily except 1.5 tablets only on  Mondays . Recheck in one week. Call us with any medication changes or concerns, Coumadin clinic # [phone]. Main #  [phone].

## 2017-07-26 ENCOUNTER — Encounter: Payer: Self-pay | Admitting: Emergency Medicine

## 2017-07-26 ENCOUNTER — Ambulatory Visit (INDEPENDENT_AMBULATORY_CARE_PROVIDER_SITE_OTHER): Payer: Medicare Other | Admitting: *Deleted

## 2017-07-26 DIAGNOSIS — I481 Persistent atrial fibrillation: Secondary | ICD-10-CM

## 2017-07-26 DIAGNOSIS — Z5181 Encounter for therapeutic drug level monitoring: Secondary | ICD-10-CM

## 2017-07-26 DIAGNOSIS — I4819 Other persistent atrial fibrillation: Secondary | ICD-10-CM

## 2017-07-26 LAB — POCT INR: INR: 3.2 — AB (ref 2.0–3.0)

## 2017-07-26 NOTE — Patient Instructions (Addendum)
Description   Today take 1/2 tablet then change dose of coumadin to 1 tablet daily. Recheck in one week. Call us with any medication changes or concerns, Coumadin clinic # [phone]. Main #  [phone].

## 2017-08-02 ENCOUNTER — Ambulatory Visit (INDEPENDENT_AMBULATORY_CARE_PROVIDER_SITE_OTHER): Payer: Medicare Other | Admitting: Pharmacist

## 2017-08-02 DIAGNOSIS — I481 Persistent atrial fibrillation: Secondary | ICD-10-CM | POA: Diagnosis not present

## 2017-08-02 DIAGNOSIS — I4819 Other persistent atrial fibrillation: Secondary | ICD-10-CM

## 2017-08-02 DIAGNOSIS — Z5181 Encounter for therapeutic drug level monitoring: Secondary | ICD-10-CM

## 2017-08-02 LAB — POCT INR: INR: 2.7 (ref 2.0–3.0)

## 2017-08-02 NOTE — Patient Instructions (Signed)
Description   Continue taking 1 tablet daily. Recheck in one week. Call us with any medication changes or concerns, Coumadin clinic # [phone]. Main #  [phone].

## 2017-08-06 ENCOUNTER — Other Ambulatory Visit: Payer: Self-pay | Admitting: Physician Assistant

## 2017-08-08 ENCOUNTER — Encounter: Payer: Self-pay | Admitting: Physician Assistant

## 2017-08-09 MED ORDER — AMITRIPTYLINE HCL 10 MG PO TABS: 10.0000 mg | ORAL_TABLET | Freq: Every day | ORAL | 2 refills | Status: DC

## 2017-08-15 ENCOUNTER — Ambulatory Visit (INDEPENDENT_AMBULATORY_CARE_PROVIDER_SITE_OTHER): Payer: Medicare Other | Admitting: Internal Medicine

## 2017-08-15 ENCOUNTER — Encounter: Payer: Self-pay | Admitting: Internal Medicine

## 2017-08-15 ENCOUNTER — Ambulatory Visit (INDEPENDENT_AMBULATORY_CARE_PROVIDER_SITE_OTHER): Payer: Medicare Other | Admitting: *Deleted

## 2017-08-15 VITALS — BP 130/68 | HR 57 | Ht 67.0 in | Wt 198.0 lb

## 2017-08-15 DIAGNOSIS — I480 Paroxysmal atrial fibrillation: Secondary | ICD-10-CM

## 2017-08-15 DIAGNOSIS — Z5181 Encounter for therapeutic drug level monitoring: Secondary | ICD-10-CM

## 2017-08-15 DIAGNOSIS — G4733 Obstructive sleep apnea (adult) (pediatric): Secondary | ICD-10-CM | POA: Diagnosis not present

## 2017-08-15 DIAGNOSIS — I4819 Other persistent atrial fibrillation: Secondary | ICD-10-CM

## 2017-08-15 DIAGNOSIS — I481 Persistent atrial fibrillation: Secondary | ICD-10-CM | POA: Diagnosis not present

## 2017-08-15 DIAGNOSIS — I10 Essential (primary) hypertension: Secondary | ICD-10-CM

## 2017-08-15 LAB — POCT INR: INR: 2.6 (ref 2.0–3.0)

## 2017-08-15 MED ORDER — SOTALOL HCL 80 MG PO TABS: 80.0000 mg | ORAL_TABLET | Freq: Two times a day (BID) | ORAL | 3 refills | Status: DC

## 2017-08-15 NOTE — Patient Instructions (Signed)
Description   Continue taking 1 tablet daily. Recheck in 2 weeks. Call us with any medication changes or concerns, Coumadin clinic # [phone]. Main #  [phone].

## 2017-08-15 NOTE — Progress Notes (Signed)
PCP: Waldon MerlMartin, William C, PA-C Primary Cardiologist: Dr Sedalia Mutaurner  Ashley Woodard is a 67 y.o. female who presents today for routine electrophysiology followup.  Since his recent afib ablation, the patient reports doing very well.  she denies procedure related complications and is pleased with the results of the procedure.  Today, she denies symptoms of palpitations, chest pain, shortness of breath,  lower extremity edema, dizziness, presyncope, or syncope.  The patient is otherwise without complaint today.   Past Medical History:  Diagnosis Date  . Abnormal cardiovascular stress test 08/21/2016   a. done for abnl EKG/cardiac risk factors -> admitted for cath 07/2016 which showed no significant CAD, normal LV contraction, mildly elevated filling pressure. Low dose Imdur was added for possible component of microvascular dysfunction.   Marland Kitchen. Anxiety   . Arthritis    "back, knees, fingers" (05/10/2017)  . Chronic lower back pain   . Colon polyps   . Deafness in right ear   . Depression   . Diverticulosis   . Hypertension   . Hypertriglyceridemia   . Hypothyroidism   . Migraine    "none in the 2000s" (05/10/2017)  . Paroxysmal atrial fibrillation (HCC)   . Pneumonia    "several times" (05/10/2017)  . PONV (postoperative nausea and vomiting)   . S/P cardiac catheterization, 08/20/16 minimal CAD 08/21/2016  . Sleep apnea    mild per patient; "recently had another test and they said I don't have it" (05/10/2017)  . Type II diabetes mellitus (HCC)   . Voice tremor    Past Surgical History:  Procedure Laterality Date  . ABDOMINAL HYSTERECTOMY  1988  . ATRIAL FIBRILLATION ABLATION N/A 05/10/2017   Procedure: ATRIAL FIBRILLATION ABLATION;  Surgeon: Hillis RangeAllred, Teleshia Lemere, MD;  [facility]: [facility];  Service: Cardiovascular;  Laterality: N/A;  . COCHLEAR IMPLANT Right 2008  . LEFT HEART CATH AND CORONARY ANGIOGRAPHY N/A 08/20/2016   Procedure: Left Heart Cath and Coronary Angiography;  Surgeon: Yvonne KendallEnd,  Christopher, MD;  Location: [facility];  Service: Cardiovascular;  Laterality: N/A;    ROS- all systems are personally reviewed and negatives except as per HPI above  Current Outpatient Medications  Medication Sig Dispense Refill  . amitriptyline (ELAVIL) 10 MG tablet Take 1 tablet (10 mg total) by mouth at bedtime. 30 tablet 2  . amLODipine (NORVASC) 10 MG tablet TAKE 1 TABLET(10 MG) BY MOUTH DAILY 30 tablet 6  . Cholecalciferol (VITAMIN D PO) Take 1 capsule by mouth daily.     . diphenhydrAMINE (BENADRYL) 25 mg capsule Take 25 mg by mouth at bedtime.    Marland Kitchen. glucose blood (CONTOUR NEXT TEST) test strip Check blood sugars once daily 100 each 6  . hydrALAZINE (APRESOLINE) 50 MG tablet Take 50 mg by mouth 2 (two) times daily.   7  . hydrochlorothiazide (HYDRODIURIL) 25 MG tablet TAKE 1 TABLET(25 MG) BY MOUTH DAILY 90 tablet 3  . irbesartan (AVAPRO) 150 MG tablet TAKE 1 TABLET(150 MG) BY MOUTH DAILY 90 tablet 3  . LANCETS ULTRA THIN 30G MISC 1 Stick by Does not apply route daily. 100 each 6  . levothyroxine (SYNTHROID, LEVOTHROID) 75 MCG tablet Take 75 mcg by mouth daily.   2  . Magnesium 200 MG TABS Take 200 mg by mouth daily.     . metFORMIN (GLUCOPHAGE) 500 MG tablet Take 1 tablet (500 mg total) by mouth 2 (two) times daily with a meal.  5  . Omega-3 Fatty Acids (FISH OIL) 1000 MG  CAPS Take 1,000 mg by mouth daily.     . potassium chloride SA (K-DUR,KLOR-CON) 20 MEQ tablet take 2 tablets in the morning and 1 tablet in the afternoon 180 tablet 1  . simvastatin (ZOCOR) 20 MG tablet Take 1 tablet (20 mg total) by mouth 3 (three) times a week. (Patient taking differently: Take 20 mg by mouth every Monday, Wednesday, and Friday. ) 90 tablet 1  . vitamin B-12 (CYANOCOBALAMIN) 1000 MCG tablet Take 1 tablet (1,000 mcg total) by mouth daily. 30 tablet 0  . warfarin (COUMADIN) 5 MG tablet Take 1 tablet (5 mg total) by mouth daily. 90 tablet 3  . sotalol (BETAPACE) 80 MG tablet Take 1 tablet (80  mg total) by mouth 2 (two) times daily. 60 tablet 3   No current facility-administered medications for this visit.     Physical Exam: Vitals:   08/15/17 0819  BP: 130/68  Pulse: (!) 57  Weight: 198 lb (89.8 kg)  Height: 5\' 7"  (1.702 m)    GEN- The patient is well appearing, alert and oriented x 3 today.   Head- normocephalic, atraumatic Eyes-  Sclera clear, conjunctiva pink Ears- hearing intact Oropharynx- clear Lungs- Clear to ausculation bilaterally, normal work of breathing Heart- Regular rate and rhythm, no murmurs, rubs or gallops, PMI not laterally displaced GI- soft, NT, ND, + BS Extremities- no clubbing, cyanosis, or edema  EKG tracing ordered today is personally reviewed and shows sinus rhythm   Assessment and Plan:  1. Paroxysmal atrial fibrillation Doing well s/p ablation chads2vasc score is 4.  Continue coumadin Reduce sotalol to 80mg  BID today.  She will consider stopping sotalol in 4-6 weeks. She would prefer to stop coumadin.  She was on xarelto but stopped due to costs.  I have advised for now that she continue.  We could consider ILR to guide anticoagulation in the future.  She would also be interested in REACT AF study if this becomes an option.  2. HTN Stable No change required today  3. OSA Mild Followed by Dr Mayford Knife  Return to see me in 3 months  Hillis Range MD, [facility] 08/15/2017 9:09 AM

## 2017-08-15 NOTE — Patient Instructions (Addendum)
Medication Instructions:  Your physician has recommended you make the following change in your medication:  1. DECREASE Sotalol to 80 mg twice daily  Labwork: None ordered  Testing/Procedures: None ordered  Follow-Up: Your physician recommends that you schedule a follow-up appointment in: 3 months with Dr. Johney FrameAllred.   * If you need a refill on your cardiac medications before your next appointment, please call your pharmacy.   *Please note that any paperwork needing to be filled out by the provider will need to be addressed at the front desk prior to seeing the provider. Please note that any FMLA, disability or other documents regarding health condition is subject to a $25.00 charge that must be received prior to completion of paperwork in the form of a money order or check.  Thank you for choosing [facility]!!

## 2017-09-01 ENCOUNTER — Other Ambulatory Visit: Payer: Self-pay | Admitting: Physician Assistant

## 2017-09-02 ENCOUNTER — Ambulatory Visit (INDEPENDENT_AMBULATORY_CARE_PROVIDER_SITE_OTHER): Payer: Medicare Other | Admitting: *Deleted

## 2017-09-02 DIAGNOSIS — Z5181 Encounter for therapeutic drug level monitoring: Secondary | ICD-10-CM | POA: Diagnosis not present

## 2017-09-02 DIAGNOSIS — I481 Persistent atrial fibrillation: Secondary | ICD-10-CM | POA: Diagnosis not present

## 2017-09-02 DIAGNOSIS — I4819 Other persistent atrial fibrillation: Secondary | ICD-10-CM

## 2017-09-02 LAB — POCT INR: INR: 2.6 (ref 2.0–3.0)

## 2017-09-02 NOTE — Telephone Encounter (Signed)
Please see message . Thank you .

## 2017-09-02 NOTE — Telephone Encounter (Signed)
Last OV 06/20/17, Next OV 12/26/17  Last filled by Rudi Cocoonna Carroll, NP 03/04/17, # 180 with 1 refill  Not sure if you are filling this medication now or if the other doctor is filling? Please advise.

## 2017-09-02 NOTE — Patient Instructions (Signed)
Description   Continue taking 1 tablet daily. Recheck in 4 weeks. Call us with any medication changes or concerns, Coumadin clinic # [phone]. Main #  [phone].

## 2017-09-02 NOTE — Telephone Encounter (Signed)
Please forward to prescribing provider

## 2017-09-07 DIAGNOSIS — E119 Type 2 diabetes mellitus without complications: Secondary | ICD-10-CM | POA: Diagnosis not present

## 2017-09-07 DIAGNOSIS — H04123 Dry eye syndrome of bilateral lacrimal glands: Secondary | ICD-10-CM | POA: Diagnosis not present

## 2017-09-07 LAB — HM DIABETES EYE EXAM

## 2017-09-09 ENCOUNTER — Encounter: Payer: Self-pay | Admitting: Emergency Medicine

## 2017-09-13 ENCOUNTER — Other Ambulatory Visit: Payer: Self-pay | Admitting: Physician Assistant

## 2017-09-15 ENCOUNTER — Encounter: Payer: Self-pay | Admitting: Physician Assistant

## 2017-09-16 MED ORDER — HYDRALAZINE HCL 50 MG PO TABS: 50.0000 mg | ORAL_TABLET | Freq: Two times a day (BID) | ORAL | 3 refills | Status: DC

## 2017-09-30 ENCOUNTER — Ambulatory Visit (INDEPENDENT_AMBULATORY_CARE_PROVIDER_SITE_OTHER): Payer: Medicare Other

## 2017-09-30 DIAGNOSIS — I481 Persistent atrial fibrillation: Secondary | ICD-10-CM | POA: Diagnosis not present

## 2017-09-30 DIAGNOSIS — Z5181 Encounter for therapeutic drug level monitoring: Secondary | ICD-10-CM | POA: Diagnosis not present

## 2017-09-30 DIAGNOSIS — I4819 Other persistent atrial fibrillation: Secondary | ICD-10-CM

## 2017-09-30 LAB — POCT INR: INR: 2.8 (ref 2.0–3.0)

## 2017-09-30 NOTE — Patient Instructions (Signed)
Description   Continue taking 1 tablet daily. Recheck in 6 weeks. Call us with any medication changes or concerns, Coumadin clinic # [phone]. Main #  [phone].

## 2017-10-03 ENCOUNTER — Encounter: Payer: Self-pay | Admitting: Physician Assistant

## 2017-10-07 ENCOUNTER — Encounter: Payer: Self-pay | Admitting: Physician Assistant

## 2017-10-10 ENCOUNTER — Encounter: Payer: Self-pay | Admitting: Physician Assistant

## 2017-10-10 MED ORDER — AMITRIPTYLINE HCL 10 MG PO TABS: 10.0000 mg | ORAL_TABLET | Freq: Every day | ORAL | 2 refills | Status: DC

## 2017-10-10 MED ORDER — AMITRIPTYLINE HCL 10 MG PO TABS: 20.0000 mg | ORAL_TABLET | Freq: Every day | ORAL | 2 refills | Status: DC

## 2017-10-11 ENCOUNTER — Other Ambulatory Visit: Payer: Self-pay | Admitting: Physician Assistant

## 2017-10-11 NOTE — Telephone Encounter (Signed)
Received and reviewed medication refill request.  Request is appropriate and was approved.  Please see medication orders for details.   Alyha Marines,  LPN   

## 2017-10-14 ENCOUNTER — Other Ambulatory Visit: Payer: Self-pay | Admitting: Physician Assistant

## 2017-10-25 ENCOUNTER — Other Ambulatory Visit: Payer: Self-pay | Admitting: Physician Assistant

## 2017-10-31 ENCOUNTER — Other Ambulatory Visit: Payer: Self-pay | Admitting: Physician Assistant

## 2017-10-31 ENCOUNTER — Encounter: Payer: Self-pay | Admitting: Physician Assistant

## 2017-10-31 DIAGNOSIS — Z1231 Encounter for screening mammogram for malignant neoplasm of breast: Secondary | ICD-10-CM

## 2017-10-31 MED ORDER — SIMVASTATIN 20 MG PO TABS: 20.0000 mg | ORAL_TABLET | ORAL | 0 refills | Status: DC

## 2017-10-31 MED ORDER — DULOXETINE HCL 30 MG PO CPEP: 30.0000 mg | ORAL_CAPSULE | Freq: Every day | ORAL | 1 refills | Status: DC

## 2017-10-31 MED ORDER — HYDRALAZINE HCL 50 MG PO TABS: 50.0000 mg | ORAL_TABLET | Freq: Two times a day (BID) | ORAL | 1 refills | Status: DC

## 2017-10-31 MED ORDER — AMLODIPINE BESYLATE 10 MG PO TABS: ORAL_TABLET | ORAL | 1 refills | Status: DC

## 2017-10-31 MED ORDER — METFORMIN HCL 500 MG PO TABS: ORAL_TABLET | ORAL | 1 refills | Status: DC

## 2017-10-31 MED ORDER — LEVOTHYROXINE SODIUM 75 MCG PO TABS: 75.0000 ug | ORAL_TABLET | Freq: Every day | ORAL | 1 refills | Status: DC

## 2017-10-31 NOTE — Telephone Encounter (Signed)
Please advise of changes with Cymbalta and Elavil.

## 2017-10-31 NOTE — Addendum Note (Signed)
Addended by: Waldon Merl on: 10/31/2017 02:14 PM   Modules accepted: Orders

## 2017-11-02 ENCOUNTER — Other Ambulatory Visit: Payer: Self-pay

## 2017-11-02 MED ORDER — POTASSIUM CHLORIDE CRYS ER 20 MEQ PO TBCR: 20.0000 meq | EXTENDED_RELEASE_TABLET | Freq: Two times a day (BID) | ORAL | 3 refills | Status: DC

## 2017-11-02 MED ORDER — IRBESARTAN 150 MG PO TABS: 150.0000 mg | ORAL_TABLET | Freq: Every day | ORAL | 3 refills | Status: DC

## 2017-11-02 MED ORDER — WARFARIN SODIUM 5 MG PO TABS: 5.0000 mg | ORAL_TABLET | Freq: Every day | ORAL | 3 refills | Status: DC

## 2017-11-02 MED ORDER — HYDROCHLOROTHIAZIDE 25 MG PO TABS: 25.0000 mg | ORAL_TABLET | Freq: Every day | ORAL | 3 refills | Status: DC

## 2017-11-02 MED ORDER — SOTALOL HCL 80 MG PO TABS: 80.0000 mg | ORAL_TABLET | Freq: Two times a day (BID) | ORAL | 3 refills | Status: DC

## 2017-11-02 NOTE — Progress Notes (Signed)
Orders sent to [facility] per request.

## 2017-11-07 ENCOUNTER — Encounter: Payer: Self-pay | Admitting: Physician Assistant

## 2017-11-11 ENCOUNTER — Ambulatory Visit (INDEPENDENT_AMBULATORY_CARE_PROVIDER_SITE_OTHER): Payer: Medicare Other | Admitting: *Deleted

## 2017-11-11 DIAGNOSIS — I481 Persistent atrial fibrillation: Secondary | ICD-10-CM

## 2017-11-11 DIAGNOSIS — Z5181 Encounter for therapeutic drug level monitoring: Secondary | ICD-10-CM | POA: Diagnosis not present

## 2017-11-11 DIAGNOSIS — I4819 Other persistent atrial fibrillation: Secondary | ICD-10-CM

## 2017-11-11 LAB — POCT INR: INR: 4.1 — AB (ref 2.0–3.0)

## 2017-11-11 NOTE — Patient Instructions (Signed)
Description   Skip today's dose, tomorrow only take 1/2 tablet, then Continue taking 1 tablet daily. Recheck in 10 days.  Call us with any medication changes or concerns, Coumadin clinic # [phone]. Main #  [phone].

## 2017-11-14 ENCOUNTER — Encounter: Payer: Self-pay | Admitting: Physician Assistant

## 2017-11-21 ENCOUNTER — Ambulatory Visit (INDEPENDENT_AMBULATORY_CARE_PROVIDER_SITE_OTHER): Payer: Medicare Other

## 2017-11-21 ENCOUNTER — Ambulatory Visit (INDEPENDENT_AMBULATORY_CARE_PROVIDER_SITE_OTHER): Payer: Medicare Other | Admitting: Internal Medicine

## 2017-11-21 ENCOUNTER — Encounter: Payer: Self-pay | Admitting: Internal Medicine

## 2017-11-21 VITALS — BP 126/72 | HR 59 | Ht 67.0 in | Wt 206.0 lb

## 2017-11-21 DIAGNOSIS — I10 Essential (primary) hypertension: Secondary | ICD-10-CM | POA: Diagnosis not present

## 2017-11-21 DIAGNOSIS — I4819 Other persistent atrial fibrillation: Secondary | ICD-10-CM

## 2017-11-21 DIAGNOSIS — I480 Paroxysmal atrial fibrillation: Secondary | ICD-10-CM | POA: Diagnosis not present

## 2017-11-21 DIAGNOSIS — I481 Persistent atrial fibrillation: Secondary | ICD-10-CM

## 2017-11-21 DIAGNOSIS — G4733 Obstructive sleep apnea (adult) (pediatric): Secondary | ICD-10-CM | POA: Diagnosis not present

## 2017-11-21 DIAGNOSIS — Z5181 Encounter for therapeutic drug level monitoring: Secondary | ICD-10-CM

## 2017-11-21 LAB — POCT INR: INR: 3.5 — AB (ref 2.0–3.0)

## 2017-11-21 NOTE — Progress Notes (Signed)
 PCP: Martin, William C, PA-C Primary Cardiologist: Dr Turner Primary EP: Dr Jahquan Klugh  Ashley Woodard is a 67 y.o. female who presents today for routine electrophysiology followup.  Since last being seen in our clinic, the patient reports doing resaonably well.  She has begun having palpitations during the later afternoon and early evening.  She is not certain as to the cause for these.  She does not think that this is afib.  She feels "tired" which she is having them.  Today, she denies symptoms of chest pain, shortness of breath,  lower extremity edema, dizziness, presyncope, or syncope.  The patient is otherwise without complaint today.   Past Medical History:  Diagnosis Date  . Abnormal cardiovascular stress test 08/21/2016   a. done for abnl EKG/cardiac risk factors -> admitted for cath 07/2016 which showed no significant CAD, normal LV contraction, mildly elevated filling pressure. Low dose Imdur was added for possible component of microvascular dysfunction.   . Anxiety   . Arthritis    "back, knees, fingers" (05/10/2017)  . Chronic lower back pain   . Colon polyps   . Deafness in right ear   . Depression   . Diverticulosis   . Hypertension   . Hypertriglyceridemia   . Hypothyroidism   . Migraine    "none in the 2000s" (05/10/2017)  . Paroxysmal atrial fibrillation (HCC)   . Pneumonia    "several times" (05/10/2017)  . PONV (postoperative nausea and vomiting)   . S/P cardiac catheterization, 08/20/16 minimal CAD 08/21/2016  . Sleep apnea    mild per patient; "recently had another test and they said I don't have it" (05/10/2017)  . Type II diabetes mellitus (HCC)   . Voice tremor    Past Surgical History:  Procedure Laterality Date  . ABDOMINAL HYSTERECTOMY  1988  . ATRIAL FIBRILLATION ABLATION N/A 05/10/2017   Procedure: ATRIAL FIBRILLATION ABLATION;  Surgeon: Lennon Richins, MD;  Location: [facility];  Service: Cardiovascular;  Laterality: N/A;  . COCHLEAR IMPLANT Right  2008  . LEFT HEART CATH AND CORONARY ANGIOGRAPHY N/A 08/20/2016   Procedure: Left Heart Cath and Coronary Angiography;  Surgeon: End, Christopher, MD;  Location: [facility];  Service: Cardiovascular;  Laterality: N/A;    ROS- all systems are reviewed and negatives except as per HPI above  Current Outpatient Medications  Medication Sig Dispense Refill  . amLODipine (NORVASC) 10 MG tablet TAKE 1 TABLET BY MOUTH EVERY DAY 90 tablet 1  . Cholecalciferol (VITAMIN D PO) Take 1 capsule by mouth daily.     . diphenhydrAMINE (BENADRYL) 25 mg capsule Take 25 mg by mouth at bedtime.    . glucose blood (CONTOUR NEXT TEST) test strip Check blood sugars once daily 100 each 6  . hydrALAZINE (APRESOLINE) 50 MG tablet Take 1 tablet (50 mg total) by mouth 2 (two) times daily. 180 tablet 1  . hydrochlorothiazide (HYDRODIURIL) 25 MG tablet Take 1 tablet (25 mg total) by mouth daily. 90 tablet 3  . irbesartan (AVAPRO) 150 MG tablet Take 1 tablet (150 mg total) by mouth daily. 90 tablet 3  . LANCETS ULTRA THIN 30G MISC 1 Stick by Does not apply route daily. 100 each 6  . levothyroxine (SYNTHROID, LEVOTHROID) 75 MCG tablet Take 1 tablet (75 mcg total) by mouth daily. 90 tablet 1  . Magnesium 200 MG TABS Take 200 mg by mouth daily.     . metFORMIN (GLUCOPHAGE) 500 MG tablet TAKE 1 TABLET BY MOUTH   TWICE A DAY WITH A MEAL 180 tablet 1  . Omega-3 Fatty Acids (FISH OIL) 1000 MG CAPS Take 1,000 mg by mouth daily.     . potassium chloride SA (KLOR-CON M20) 20 MEQ tablet Take 1 tablet (20 mEq total) by mouth 2 (two) times daily. TAKE 1 TABLET BY MOUTH TWICE A DAY 180 tablet 3  . simvastatin (ZOCOR) 20 MG tablet Take 1 tablet (20 mg total) by mouth every Monday, Wednesday, and Friday. 90 tablet 0  . sotalol (BETAPACE) 80 MG tablet Take 1 tablet (80 mg total) by mouth 2 (two) times daily. 180 tablet 3  . vitamin B-12 (CYANOCOBALAMIN) 1000 MCG tablet Take 1 tablet (1,000 mcg total) by mouth daily. 30 tablet 0  .  warfarin (COUMADIN) 5 MG tablet Take 1 tablet (5 mg total) by mouth daily. 90 tablet 3  . DULoxetine (CYMBALTA) 30 MG capsule Take 1 capsule (30 mg total) by mouth daily. (Patient not taking: Reported on 11/21/2017) 90 capsule 1   No current facility-administered medications for this visit.     Physical Exam: Vitals:   11/21/17 1623  BP: 126/72  Pulse: (!) 59  SpO2: 98%  Weight: 206 lb (93.4 kg)  Height: 5' 7" (1.702 m)    GEN- The patient is well appearing, alert and oriented x 3 today.   Head- normocephalic, atraumatic Eyes-  Sclera clear, conjunctiva pink Ears- hearing intact Oropharynx- clear Lungs- Clear to ausculation bilaterally, normal work of breathing Heart- Regular rate and rhythm, no murmurs, rubs or gallops, PMI not laterally displaced GI- soft, NT, ND, + BS Extremities- no clubbing, cyanosis, or edema  Wt Readings from Last 3 Encounters:  11/21/17 206 lb (93.4 kg)  08/15/17 198 lb (89.8 kg)  06/20/17 200 lb (90.7 kg)    EKG tracing ordered today is personally reviewed and shows sinus rhythm 59 bpm, PR 188 msec, QRS 94 msec, Qtc 473 msec  Assessment and Plan:  1. Paroxysmal atrial fibrillation Doing well s/p ablation off AAD therapy She is having palpitations of unclear etiology.  There is concern for afib as a possible cause.  I would advise ILR for long term monitoring and afib management post ablation.  chads2vasc score is 4.  Continue coumadin.  If we find that she has no further afib on ILR, she would like to consider discontinuation of coumadin at some point.  2. HTN Stable No change required today  3. Mild OSA Followed by Dr Turner  Return in 6 weeks after ILR implant  Marlies Ligman MD, FACC 11/21/2017 4:33 PM   

## 2017-11-21 NOTE — H&P (View-Only) (Signed)
PCP: Waldon Merl, PA-C Primary Cardiologist: Dr Mayford Knife Primary EP: Dr Sharmon Leyden Ashley Woodard is a 67 y.o. female who presents today for routine electrophysiology followup.  Since last being seen in our clinic, the patient reports doing resaonably well.  She has begun having palpitations during the later afternoon and early evening.  She is not certain as to the cause for these.  She does not think that this is afib.  She feels "tired" which she is having them.  Today, she denies symptoms of chest pain, shortness of breath,  lower extremity edema, dizziness, presyncope, or syncope.  The patient is otherwise without complaint today.   Past Medical History:  Diagnosis Date  . Abnormal cardiovascular stress test 08/21/2016   a. done for abnl EKG/cardiac risk factors -> admitted for cath 07/2016 which showed no significant CAD, normal LV contraction, mildly elevated filling pressure. Low dose Imdur was added for possible component of microvascular dysfunction.   Marland Kitchen Anxiety   . Arthritis    "back, knees, fingers" (05/10/2017)  . Chronic lower back pain   . Colon polyps   . Deafness in right ear   . Depression   . Diverticulosis   . Hypertension   . Hypertriglyceridemia   . Hypothyroidism   . Migraine    "none in the 2000s" (05/10/2017)  . Paroxysmal atrial fibrillation (HCC)   . Pneumonia    "several times" (05/10/2017)  . PONV (postoperative nausea and vomiting)   . S/P cardiac catheterization, 08/20/16 minimal CAD 08/21/2016  . Sleep apnea    mild per patient; "recently had another test and they said I don't have it" (05/10/2017)  . Type II diabetes mellitus (HCC)   . Voice tremor    Past Surgical History:  Procedure Laterality Date  . ABDOMINAL HYSTERECTOMY  1988  . ATRIAL FIBRILLATION ABLATION N/A 05/10/2017   Procedure: ATRIAL FIBRILLATION ABLATION;  Surgeon: Hillis Range, MD;  Location: [facility];  Service: Cardiovascular;  Laterality: N/A;  . COCHLEAR IMPLANT Right  2008  . LEFT HEART CATH AND CORONARY ANGIOGRAPHY N/A 08/20/2016   Procedure: Left Heart Cath and Coronary Angiography;  Surgeon: Yvonne Kendall, MD;  Location: [facility];  Service: Cardiovascular;  Laterality: N/A;    ROS- all systems are reviewed and negatives except as per HPI above  Current Outpatient Medications  Medication Sig Dispense Refill  . amLODipine (NORVASC) 10 MG tablet TAKE 1 TABLET BY MOUTH EVERY DAY 90 tablet 1  . Cholecalciferol (VITAMIN D PO) Take 1 capsule by mouth daily.     . diphenhydrAMINE (BENADRYL) 25 mg capsule Take 25 mg by mouth at bedtime.    Marland Kitchen glucose blood (CONTOUR NEXT TEST) test strip Check blood sugars once daily 100 each 6  . hydrALAZINE (APRESOLINE) 50 MG tablet Take 1 tablet (50 mg total) by mouth 2 (two) times daily. 180 tablet 1  . hydrochlorothiazide (HYDRODIURIL) 25 MG tablet Take 1 tablet (25 mg total) by mouth daily. 90 tablet 3  . irbesartan (AVAPRO) 150 MG tablet Take 1 tablet (150 mg total) by mouth daily. 90 tablet 3  . LANCETS ULTRA THIN 30G MISC 1 Stick by Does not apply route daily. 100 each 6  . levothyroxine (SYNTHROID, LEVOTHROID) 75 MCG tablet Take 1 tablet (75 mcg total) by mouth daily. 90 tablet 1  . Magnesium 200 MG TABS Take 200 mg by mouth daily.     . metFORMIN (GLUCOPHAGE) 500 MG tablet TAKE 1 TABLET BY MOUTH  TWICE A DAY WITH A MEAL 180 tablet 1  . Omega-3 Fatty Acids (FISH OIL) 1000 MG CAPS Take 1,000 mg by mouth daily.     . potassium chloride SA (KLOR-CON M20) 20 MEQ tablet Take 1 tablet (20 mEq total) by mouth 2 (two) times daily. TAKE 1 TABLET BY MOUTH TWICE A DAY 180 tablet 3  . simvastatin (ZOCOR) 20 MG tablet Take 1 tablet (20 mg total) by mouth every Monday, Wednesday, and Friday. 90 tablet 0  . sotalol (BETAPACE) 80 MG tablet Take 1 tablet (80 mg total) by mouth 2 (two) times daily. 180 tablet 3  . vitamin B-12 (CYANOCOBALAMIN) 1000 MCG tablet Take 1 tablet (1,000 mcg total) by mouth daily. 30 tablet 0  .  warfarin (COUMADIN) 5 MG tablet Take 1 tablet (5 mg total) by mouth daily. 90 tablet 3  . DULoxetine (CYMBALTA) 30 MG capsule Take 1 capsule (30 mg total) by mouth daily. (Patient not taking: Reported on 11/21/2017) 90 capsule 1   No current facility-administered medications for this visit.     Physical Exam: Vitals:   11/21/17 1623  BP: 126/72  Pulse: (!) 59  SpO2: 98%  Weight: 206 lb (93.4 kg)  Height: 5\' 7"  (1.702 m)    GEN- The patient is well appearing, alert and oriented x 3 today.   Head- normocephalic, atraumatic Eyes-  Sclera clear, conjunctiva pink Ears- hearing intact Oropharynx- clear Lungs- Clear to ausculation bilaterally, normal work of breathing Heart- Regular rate and rhythm, no murmurs, rubs or gallops, PMI not laterally displaced GI- soft, NT, ND, + BS Extremities- no clubbing, cyanosis, or edema  Wt Readings from Last 3 Encounters:  11/21/17 206 lb (93.4 kg)  08/15/17 198 lb (89.8 kg)  06/20/17 200 lb (90.7 kg)    EKG tracing ordered today is personally reviewed and shows sinus rhythm 59 bpm, PR 188 msec, QRS 94 msec, Qtc 473 msec  Assessment and Plan:  1. Paroxysmal atrial fibrillation Doing well s/p ablation off AAD therapy She is having palpitations of unclear etiology.  There is concern for afib as a possible cause.  I would advise ILR for long term monitoring and afib management post ablation.  chads2vasc score is 4.  Continue coumadin.  If we find that she has no further afib on ILR, she would like to consider discontinuation of coumadin at some point.  2. HTN Stable No change required today  3. Mild OSA Followed by Dr Mayford Knife  Return in 6 weeks after ILR implant  Hillis Range MD, [facility] 11/21/2017 4:33 PM

## 2017-11-21 NOTE — Patient Instructions (Addendum)
Medication Instructions:  Your physician recommends that you continue on your current medications as directed. Please refer to the Current Medication list given to you today.  Labwork: None ordered.  Procedures: Implantable Loop Recorder Placement An implantable loop recorder is a small electronic device that is placed under the skin of your chest. It is about the size of an AA ("double A") battery. The device records the electrical activity of your heart over a long period of time. Your health care provider can download these recordings to monitor your heart. You may need an implantable loop recorder if you have periods of abnormal heart activity (arrhythmias) or unexplained fainting (syncope) caused by a heart problem.  CODES FOR LOOP: IMPLANT CODE IS 332[address] MONITORING CODES ARE:  93298 AND 16109  Please arrive to Admitting at the [facility] main entrance of [facility] hospital at:  6:30 am on November 29, 2017 No special instructions:  You may eat, drink and take your normal medications. You will be discharged shortly after your procedure.  Follow-Up:  You will follow up with device clinic 7-10 days after your procedure for a wound check.  Your physician wants you to follow-up in: 6 weeks with Dr. Johney Frame.      Any Other Special Instructions Will Be Listed Below (If Applicable).  If you need a refill on your cardiac medications before your next appointment, please call your pharmacy.

## 2017-11-21 NOTE — Patient Instructions (Signed)
Description   Skip today's dosage of Coumadin, then start taking 1 tablet daily except 1/2 tablet on Mondays. Recheck in 2 weeks.  Call us with any medication changes or concerns, Coumadin clinic # [phone]. Main #  [phone].

## 2017-11-28 ENCOUNTER — Ambulatory Visit
Admission: RE | Admit: 2017-11-28 | Discharge: 2017-11-28 | Disposition: A | Discharge status: Home / Self Care | Payer: Medicare Other | Source: Ambulatory Visit | Attending: Physician Assistant | Admitting: Physician Assistant

## 2017-11-28 DIAGNOSIS — Z1231 Encounter for screening mammogram for malignant neoplasm of breast: Secondary | ICD-10-CM | POA: Diagnosis not present

## 2017-11-29 ENCOUNTER — Encounter (HOSPITAL_COMMUNITY)
Admission: RE | Disposition: A | Discharge status: Home / Self Care | Payer: Self-pay | Source: Ambulatory Visit | Attending: Internal Medicine

## 2017-11-29 ENCOUNTER — Ambulatory Visit (HOSPITAL_COMMUNITY)
Admission: RE | Admit: 2017-11-29 | Discharge: 2017-11-29 | Disposition: A | Discharge status: Home / Self Care | Payer: Medicare Other | Source: Ambulatory Visit | Attending: Internal Medicine | Admitting: Internal Medicine

## 2017-11-29 ENCOUNTER — Encounter: Payer: Self-pay | Admitting: Emergency Medicine

## 2017-11-29 ENCOUNTER — Encounter (HOSPITAL_COMMUNITY): Payer: Self-pay | Admitting: Internal Medicine

## 2017-11-29 DIAGNOSIS — I2510 Atherosclerotic heart disease of native coronary artery without angina pectoris: Secondary | ICD-10-CM | POA: Insufficient documentation

## 2017-11-29 DIAGNOSIS — E039 Hypothyroidism, unspecified: Secondary | ICD-10-CM | POA: Diagnosis not present

## 2017-11-29 DIAGNOSIS — Z7984 Long term (current) use of oral hypoglycemic drugs: Secondary | ICD-10-CM | POA: Diagnosis not present

## 2017-11-29 DIAGNOSIS — Z9889 Other specified postprocedural states: Secondary | ICD-10-CM | POA: Diagnosis not present

## 2017-11-29 DIAGNOSIS — G4733 Obstructive sleep apnea (adult) (pediatric): Secondary | ICD-10-CM | POA: Diagnosis not present

## 2017-11-29 DIAGNOSIS — E119 Type 2 diabetes mellitus without complications: Secondary | ICD-10-CM | POA: Diagnosis not present

## 2017-11-29 DIAGNOSIS — Z79899 Other long term (current) drug therapy: Secondary | ICD-10-CM | POA: Insufficient documentation

## 2017-11-29 DIAGNOSIS — E781 Pure hyperglyceridemia: Secondary | ICD-10-CM | POA: Insufficient documentation

## 2017-11-29 DIAGNOSIS — M1990 Unspecified osteoarthritis, unspecified site: Secondary | ICD-10-CM | POA: Insufficient documentation

## 2017-11-29 DIAGNOSIS — I480 Paroxysmal atrial fibrillation: Secondary | ICD-10-CM | POA: Insufficient documentation

## 2017-11-29 DIAGNOSIS — Z79890 Hormone replacement therapy (postmenopausal): Secondary | ICD-10-CM | POA: Insufficient documentation

## 2017-11-29 DIAGNOSIS — F419 Anxiety disorder, unspecified: Secondary | ICD-10-CM | POA: Diagnosis not present

## 2017-11-29 DIAGNOSIS — Z955 Presence of coronary angioplasty implant and graft: Secondary | ICD-10-CM | POA: Diagnosis not present

## 2017-11-29 DIAGNOSIS — I10 Essential (primary) hypertension: Secondary | ICD-10-CM | POA: Diagnosis not present

## 2017-11-29 DIAGNOSIS — Z86010 Personal history of colonic polyps: Secondary | ICD-10-CM | POA: Insufficient documentation

## 2017-11-29 DIAGNOSIS — R002 Palpitations: Secondary | ICD-10-CM | POA: Insufficient documentation

## 2017-11-29 DIAGNOSIS — H9191 Unspecified hearing loss, right ear: Secondary | ICD-10-CM | POA: Diagnosis not present

## 2017-11-29 DIAGNOSIS — Z7901 Long term (current) use of anticoagulants: Secondary | ICD-10-CM | POA: Insufficient documentation

## 2017-11-29 DIAGNOSIS — F329 Major depressive disorder, single episode, unspecified: Secondary | ICD-10-CM | POA: Insufficient documentation

## 2017-11-29 DIAGNOSIS — Z8701 Personal history of pneumonia (recurrent): Secondary | ICD-10-CM | POA: Diagnosis not present

## 2017-11-29 DIAGNOSIS — I4891 Unspecified atrial fibrillation: Secondary | ICD-10-CM | POA: Diagnosis not present

## 2017-11-29 DIAGNOSIS — Z90710 Acquired absence of both cervix and uterus: Secondary | ICD-10-CM | POA: Diagnosis not present

## 2017-11-29 HISTORY — PX: LOOP RECORDER INSERTION: EP1214

## 2017-11-29 SURGERY — LOOP RECORDER INSERTION

## 2017-11-29 MED ORDER — LIDOCAINE-EPINEPHRINE 1 %-1:100000 IJ SOLN
INTRAMUSCULAR | Status: DC | PRN
  Administered 2017-11-29: 10 mL

## 2017-11-29 SURGICAL SUPPLY — 2 items
LOOP REVEAL LINQSYS (Prosthesis & Implant Heart) ×3 IMPLANT
PACK LOOP INSERTION (CUSTOM PROCEDURE TRAY) ×3 IMPLANT

## 2017-11-29 NOTE — Interval H&P Note (Signed)
History and Physical Interval Note:  11/29/2017 7:10 AM  Ashley Woodard  has presented today for surgery, with the diagnosis of afib  The various methods of treatment have been discussed with the patient and family. After consideration of risks, benefits and other options for treatment, the patient has consented to  Procedure(s): LOOP RECORDER INSERTION (N/A) as a surgical intervention .  The patient's history has been reviewed, patient examined, no change in status, stable for surgery.  I have reviewed the patient's chart and labs.  Questions were answered to the patient's satisfaction.     Hillis Range

## 2017-12-05 DIAGNOSIS — Z6832 Body mass index (BMI) 32.0-32.9, adult: Secondary | ICD-10-CM | POA: Diagnosis not present

## 2017-12-05 DIAGNOSIS — J019 Acute sinusitis, unspecified: Secondary | ICD-10-CM | POA: Diagnosis not present

## 2017-12-05 DIAGNOSIS — R05 Cough: Secondary | ICD-10-CM | POA: Diagnosis not present

## 2017-12-09 ENCOUNTER — Ambulatory Visit (INDEPENDENT_AMBULATORY_CARE_PROVIDER_SITE_OTHER): Payer: Medicare Other | Admitting: Pharmacist

## 2017-12-09 DIAGNOSIS — I4819 Other persistent atrial fibrillation: Secondary | ICD-10-CM

## 2017-12-09 DIAGNOSIS — Z5181 Encounter for therapeutic drug level monitoring: Secondary | ICD-10-CM | POA: Diagnosis not present

## 2017-12-09 LAB — POCT INR: INR: 3.4 — AB (ref 2.0–3.0)

## 2017-12-09 NOTE — Patient Instructions (Signed)
Description   Skip your Coumadin today, then start taking 1 tablet daily except 1/2 tablet on Mondays and Fridays. Recheck in 2 weeks.  Call us with any medication changes or concerns, Coumadin clinic # [phone]. Main #  [phone].

## 2017-12-12 ENCOUNTER — Ambulatory Visit (INDEPENDENT_AMBULATORY_CARE_PROVIDER_SITE_OTHER): Payer: Medicare Other | Admitting: *Deleted

## 2017-12-12 DIAGNOSIS — I480 Paroxysmal atrial fibrillation: Secondary | ICD-10-CM

## 2017-12-12 LAB — CUP PACEART INCLINIC DEVICE CHECK
Date Time Interrogation Session: 20191021155823
MDC IDC PG IMPLANT DT: 20191008

## 2017-12-12 NOTE — Progress Notes (Signed)
Wound check appointment. Steri-strips removed by patient prior to appointment. Wound without redness or edema. Incision edges approximated, wound well healed. Normal device function. Battery status: good. R-waves 0.38mV. No symptom, tachy, pause, brady, or AF episodes, +warfarin and sotalol. Patient educated about symptom activator and Carelink monitor. Monthly summary reports and ROV with JA on 01/11/18.

## 2017-12-23 ENCOUNTER — Ambulatory Visit (INDEPENDENT_AMBULATORY_CARE_PROVIDER_SITE_OTHER): Payer: Medicare Other | Admitting: Physician Assistant

## 2017-12-23 ENCOUNTER — Telehealth: Payer: Self-pay | Admitting: Physician Assistant

## 2017-12-23 ENCOUNTER — Other Ambulatory Visit: Payer: Self-pay

## 2017-12-23 ENCOUNTER — Encounter: Payer: Self-pay | Admitting: Physician Assistant

## 2017-12-23 ENCOUNTER — Ambulatory Visit (INDEPENDENT_AMBULATORY_CARE_PROVIDER_SITE_OTHER): Payer: Medicare Other | Admitting: Pharmacist

## 2017-12-23 VITALS — BP 118/62 | HR 61 | Temp 98.2°F | Resp 14 | Ht 67.0 in | Wt 208.0 lb

## 2017-12-23 DIAGNOSIS — Z794 Long term (current) use of insulin: Secondary | ICD-10-CM

## 2017-12-23 DIAGNOSIS — I4819 Other persistent atrial fibrillation: Secondary | ICD-10-CM

## 2017-12-23 DIAGNOSIS — M5136 Other intervertebral disc degeneration, lumbar region: Secondary | ICD-10-CM | POA: Diagnosis not present

## 2017-12-23 DIAGNOSIS — Z5181 Encounter for therapeutic drug level monitoring: Secondary | ICD-10-CM | POA: Diagnosis not present

## 2017-12-23 DIAGNOSIS — E1100 Type 2 diabetes mellitus with hyperosmolarity without nonketotic hyperglycemic-hyperosmolar coma (NKHHC): Secondary | ICD-10-CM

## 2017-12-23 LAB — POCT GLYCOSYLATED HEMOGLOBIN (HGB A1C): Hemoglobin A1C: 6.2 % — AB (ref 4.0–5.6)

## 2017-12-23 LAB — BASIC METABOLIC PANEL
BUN: 18 mg/dL (ref 6–23)
CHLORIDE: 106 meq/L (ref 96–112)
CO2: 28 mEq/L (ref 19–32)
CREATININE: 0.83 mg/dL (ref 0.40–1.20)
Calcium: 9.5 mg/dL (ref 8.4–10.5)
GFR: 72.96 mL/min (ref >60.00)
GLUCOSE: 109 mg/dL — AB (ref 70–99)
Potassium: 3.8 mEq/L (ref 3.5–5.1)
Sodium: 142 mEq/L (ref 135–145)

## 2017-12-23 LAB — POCT INR: INR: 2.8 (ref 2.0–3.0)

## 2017-12-23 MED ORDER — NORTRIPTYLINE HCL 25 MG PO CAPS: 25.0000 mg | ORAL_CAPSULE | Freq: Every day | ORAL | 3 refills | Status: DC

## 2017-12-23 NOTE — Telephone Encounter (Signed)
Copied from CRM [id number]. Topic: General - Other >> Dec 23, 2017  2:55 PM Leafy Ro wrote: Reason for CRM: allison pharmacist at  [facility] is calling there is drug interaction with nortriptyline and sotalol it can cause increase risk of heart arrhthymia. Please advise

## 2017-12-23 NOTE — Patient Instructions (Addendum)
Please go to the lab today for blood work.  I will call you with your results. We will alter treatment regimen(s) if indicated by your results.  Please start the Pamelor as directed. Follow-up 3-4 weeks for reassessment.   Diabetes Mellitus and Exercise Exercising regularly is important for your overall health, especially when you have diabetes (diabetes mellitus). Exercising is not only about losing weight. It has many health benefits, such as increasing muscle strength and bone density and reducing body fat and stress. This leads to improved fitness, flexibility, and endurance, all of which result in better overall health. Exercise has additional benefits for people with diabetes, including:  Reducing appetite.  Helping to lower and control blood glucose.  Lowering blood pressure.  Helping to control amounts of fatty substances (lipids) in the blood, such as cholesterol and triglycerides.  Helping the body to respond better to insulin (improving insulin sensitivity).  Reducing how much insulin the body needs.  Decreasing the risk for heart disease by: ? Lowering cholesterol and triglyceride levels. ? Increasing the levels of good cholesterol. ? Lowering blood glucose levels.  What is my activity plan? Your health care provider or certified diabetes educator can help you make a plan for the type and frequency of exercise (activity plan) that works for you. Make sure that you:  Do at least 150 minutes of moderate-intensity or vigorous-intensity exercise each week. This could be brisk walking, biking, or water aerobics. ? Do stretching and strength exercises, such as yoga or weightlifting, at least 2 times a week. ? Spread out your activity over at least 3 days of the week.  Get some form of physical activity every day. ? Do not go more than 2 days in a row without some kind of physical activity. ? Avoid being inactive for more than 90 minutes at a time. Take frequent breaks to  walk or stretch.  Choose a type of exercise or activity that you enjoy, and set realistic goals.  Start slowly, and gradually increase the intensity of your exercise over time.  What do I need to know about managing my diabetes?  Check your blood glucose before and after exercising. ? If your blood glucose is higher than 240 mg/dL (40.9 mmol/L) before you exercise, check your urine for ketones. If you have ketones in your urine, do not exercise until your blood glucose returns to normal.  Know the symptoms of low blood glucose (hypoglycemia) and how to treat it. Your risk for hypoglycemia increases during and after exercise. Common symptoms of hypoglycemia can include: ? Hunger. ? Anxiety. ? Sweating and feeling clammy. ? Confusion. ? Dizziness or feeling light-headed. ? Increased heart rate or palpitations. ? Blurry vision. ? Tingling or numbness around the mouth, lips, or tongue. ? Tremors or shakes. ? Irritability.  Keep a rapid-acting carbohydrate snack available before, during, and after exercise to help prevent or treat hypoglycemia.  Avoid injecting insulin into areas of the body that are going to be exercised. For example, avoid injecting insulin into: ? The arms, when playing tennis. ? The legs, when jogging.  Keep records of your exercise habits. Doing this can help you and your health care provider adjust your diabetes management plan as needed. Write down: ? Food that you eat before and after you exercise. ? Blood glucose levels before and after you exercise. ? The type and amount of exercise you have done. ? When your insulin is expected to peak, if you use insulin. Avoid exercising at times  when your insulin is peaking.  When you start a new exercise or activity, work with your health care provider to make sure the activity is safe for you, and to adjust your insulin, medicines, or food intake as needed.  Drink plenty of water while you exercise to prevent  dehydration or heat stroke. Drink enough fluid to keep your urine clear or pale yellow. This information is not intended to replace advice given to you by your health care provider. Make sure you discuss any questions you have with your health care provider. Document Released: 05/01/2003 Document Revised: 08/29/2015 Document Reviewed: 07/21/2015 Elsevier Interactive Patient Education  2018 [employer].

## 2017-12-23 NOTE — Progress Notes (Signed)
History of Present Illness: Patient is a 67 y.o. female who presents to clinic today for follow-up of Diabetes Mellitus II, previously controlled.  Patient currently on medication regimen of Metformin.  Endorses taking medications as directed. Endorses trying to keep a well-balanced diet. Is not as active as she was previously.  Denies change in vision, urinary habits. Is not checking blood glucose as directed.   Latest Maintenance: A1C --  Lab Results  Component Value Date   HGBA1C 6.3 06/23/2017   Diabetic Eye Exam -- up to-date Urine Microalbumin -- up-to-date Foot Exam -- up-to-date  Also wanting to restart medication for chronic back pain. Was on Cymbalta previously but interacts with her warfarin. Was unable to tolerate Gabapentin, Lyrica previously.  Past Medical History:  Diagnosis Date  . Abnormal cardiovascular stress test 08/21/2016   a. done for abnl EKG/cardiac risk factors -> admitted for cath 07/2016 which showed no significant CAD, normal LV contraction, mildly elevated filling pressure. Low dose Imdur was added for possible component of microvascular dysfunction.   Marland Kitchen Anxiety   . Arthritis    "back, knees, fingers" (05/10/2017)  . Chronic lower back pain   . Colon polyps   . Deafness in right ear   . Depression   . Diverticulosis   . Hypertension   . Hypertriglyceridemia   . Hypothyroidism   . Migraine    "none in the 2000s" (05/10/2017)  . Paroxysmal atrial fibrillation (HCC)   . Pneumonia    "several times" (05/10/2017)  . PONV (postoperative nausea and vomiting)   . S/P cardiac catheterization, 08/20/16 minimal CAD 08/21/2016  . Sleep apnea    mild per patient; "recently had another test and they said I don't have it" (05/10/2017)  . Type II diabetes mellitus (HCC)   . Voice tremor     Current Outpatient Medications on File Prior to Visit  Medication Sig Dispense Refill  . amLODipine (NORVASC) 10 MG tablet TAKE 1 TABLET BY MOUTH EVERY DAY 90 tablet 1  .  Cholecalciferol (VITAMIN D) 2000 units CAPS Take 1 capsule by mouth daily.     Marland Kitchen doxylamine, Sleep, (SLEEP AID) 25 MG tablet Take 25 mg by mouth at bedtime.    Marland Kitchen glucose blood (CONTOUR NEXT TEST) test strip Check blood sugars once daily 100 each 6  . hydrALAZINE (APRESOLINE) 50 MG tablet Take 1 tablet (50 mg total) by mouth 2 (two) times daily. 180 tablet 1  . hydrochlorothiazide (HYDRODIURIL) 25 MG tablet Take 1 tablet (25 mg total) by mouth daily. 90 tablet 3  . irbesartan (AVAPRO) 150 MG tablet Take 1 tablet (150 mg total) by mouth daily. 90 tablet 3  . LANCETS ULTRA THIN 30G MISC 1 Stick by Does not apply route daily. 100 each 6  . levothyroxine (SYNTHROID, LEVOTHROID) 75 MCG tablet Take 1 tablet (75 mcg total) by mouth daily. 90 tablet 1  . Magnesium 200 MG TABS Take 200 mg by mouth daily.     . metFORMIN (GLUCOPHAGE) 500 MG tablet TAKE 1 TABLET BY MOUTH TWICE A DAY WITH A MEAL 180 tablet 1  . Omega-3 Fatty Acids (FISH OIL) 1000 MG CAPS Take 1,000 mg by mouth daily.     . potassium chloride SA (KLOR-CON M20) 20 MEQ tablet Take 1 tablet (20 mEq total) by mouth 2 (two) times daily. TAKE 1 TABLET BY MOUTH TWICE A DAY 180 tablet 3  . simvastatin (ZOCOR) 20 MG tablet Take 1 tablet (20 mg total) by mouth every Monday,  Wednesday, and Friday. 90 tablet 0  . sotalol (BETAPACE) 80 MG tablet Take 1 tablet (80 mg total) by mouth 2 (two) times daily. 180 tablet 3  . vitamin B-12 (CYANOCOBALAMIN) 1000 MCG tablet Take 1 tablet (1,000 mcg total) by mouth daily. 30 tablet 0  . warfarin (COUMADIN) 5 MG tablet Take 1 tablet (5 mg total) by mouth daily. (Patient taking differently: Take 5 mg by mouth daily. 5 mg Tues - Sunday, Monday is 2.5 mg) 90 tablet 3   No current facility-administered medications on file prior to visit.     Allergies  Allergen Reactions  . Demeclocycline Rash  . Doxycycline Rash  . Erythromycin Rash  . Lisinopril Cough  . Tetracyclines & Related Rash    Family History  Problem  Relation Age of Onset  . Hypertension Mother   . Diabetes Mother   . Heart attack Mother   . Hypertension Father   . Heart attack Father   . Stroke Father   . Breast cancer Sister 47  . Hypertension Brother   . Cancer Brother        Prostate  . Diabetes Brother   . Dementia Brother   . Stroke Brother   . Heart attack Brother   . Hypertension Son   . Breast cancer Maternal Aunt        in 68's  . Cancer Maternal Uncle        Lung    Social History   Socioeconomic History  . Marital status: Married    Spouse name: Not on file  . Number of children: 2  . Years of education: 64  . Highest education level: Not on file  Occupational History  . Occupation: Art therapist  Social Needs  . Financial resource strain: Not on file  . Food insecurity:    Worry: Not on file    Inability: Not on file  . Transportation needs:    Medical: Not on file    Non-medical: Not on file  Tobacco Use  . Smoking status: Never Smoker  . Smokeless tobacco: Never Used  Substance and Sexual Activity  . Alcohol use: No  . Drug use: No  . Sexual activity: Not Currently  Lifestyle  . Physical activity:    Days per week: Not on file    Minutes per session: Not on file  . Stress: Not on file  Relationships  . Social connections:    Talks on phone: Not on file    Gets together: Not on file    Attends religious service: Not on file    Active member of club or organization: Not on file    Attends meetings of clubs or organizations: Not on file    Relationship status: Not on file  Other Topics Concern  . Not on file  Social History Narrative   Lives at home with husband in [place].   Right-handed.   No caffeine use.   Manages a call center   Review of Systems: Pertinent ROS are listed in HPI  Physical Examination: BP 118/62   Pulse 61   Temp 98.2 F (36.8 C) (Oral)   Resp 14   Ht 5\' 7"  (1.702 m)   Wt 208 lb (94.3 kg)   SpO2 98%   BMI 32.58 kg/m  General appearance: alert,  cooperative, appears stated age and no distress Lungs: clear to auscultation bilaterally Heart: regular rate and rhythm, S1, S2 normal, no murmur, click, rub or gallop Neurologic: Alert and oriented  X 3, normal strength and tone. Normal symmetric reflexes. Normal coordination and gait  Assessment/Plan: 1. Type 2 diabetes mellitus with hyperosmolarity without coma, with long-term current use of insulin (HCC) A1C at 6.2 showing this is still well controlled. Will check BMP today. Continue current medications regimen.  - POCT HgB A1C - Basic metabolic panel  2. DDD (degenerative disc disease), lumbar Will start trial of Pamelor 25 mg daily. Follow-up 3-4 weeks.  - nortriptyline (PAMELOR) 25 MG capsule; Take 1 capsule (25 mg total) by mouth at bedtime.  Dispense: 30 capsule; Refill: 3

## 2017-12-23 NOTE — Patient Instructions (Signed)
Description   Continue taking 1 tablet daily except 1/2 tablet on Mondays and Fridays. Recheck in 2 weeks - starting Pamelor on 11/1 which can increase INR.  Call us with any medication changes or concerns, Coumadin clinic # [phone]. Main #  [phone].

## 2017-12-23 NOTE — Telephone Encounter (Signed)
Spoke with Trinna Post at the pharmacy to dc medication. LM for patient to call so that I can inform her of this.

## 2017-12-23 NOTE — Telephone Encounter (Signed)
Discontinue RX. Let patient know that I will have to look into other options for her that are covered by her insurance as the other options interact with her warfarin and other medications.

## 2017-12-26 ENCOUNTER — Ambulatory Visit: Payer: Medicare Other | Admitting: Physician Assistant

## 2017-12-26 NOTE — Telephone Encounter (Signed)
Advised patient of stopping the Nortriptyline due to interaction with her Sotalol.  She was on Cymbalta previous and is ok with going back on that one for pain control. Please advise

## 2017-12-27 MED ORDER — DULOXETINE HCL 20 MG PO CPEP: 20.0000 mg | ORAL_CAPSULE | Freq: Every day | ORAL | 3 refills | Status: DC

## 2017-12-27 NOTE — Addendum Note (Signed)
Addended by: Con Memos on: 12/27/2017 10:22 AM   Modules accepted: Orders

## 2017-12-27 NOTE — Telephone Encounter (Signed)
Discussed with supervising MD -- due to limited options with the Sotalol and giving ineffectiveness of gabapentin and high cost of Lyrica with Medicare, best option would be to restart the Cymbalta at lowest dose (20 mg daily). Have her follow-up with coumadin clinic within 1 week of restarting medication for INR check. We will just have to keep closer eye on coumadin levels if/when we adjust dose of the medication.   Ok to send in 30 with 3 refills of Cymbalta 20 mg. Follow-up in 3-4 weeks for recheck of pain with medication.

## 2017-12-27 NOTE — Telephone Encounter (Signed)
Spoke with patient about restarting the Cymbalta 20 mg daily. Rx sent to the pharmacy. Scheduled for 01/27/18 for follow up. Patient is agreeable with recommendations.

## 2018-01-02 ENCOUNTER — Ambulatory Visit (INDEPENDENT_AMBULATORY_CARE_PROVIDER_SITE_OTHER): Payer: Medicare Other | Admitting: *Deleted

## 2018-01-02 DIAGNOSIS — I480 Paroxysmal atrial fibrillation: Secondary | ICD-10-CM

## 2018-01-02 NOTE — Progress Notes (Signed)
Carelink Summary Report / Loop Recorder 

## 2018-01-06 ENCOUNTER — Ambulatory Visit (INDEPENDENT_AMBULATORY_CARE_PROVIDER_SITE_OTHER): Payer: Medicare Other | Admitting: *Deleted

## 2018-01-06 DIAGNOSIS — I4819 Other persistent atrial fibrillation: Secondary | ICD-10-CM

## 2018-01-06 DIAGNOSIS — Z5181 Encounter for therapeutic drug level monitoring: Secondary | ICD-10-CM

## 2018-01-06 LAB — POCT INR: INR: 2.4 (ref 2.0–3.0)

## 2018-01-06 NOTE — Patient Instructions (Signed)
  Description   Continue taking 1 tablet daily except 1/2 tablet on Mondays and Fridays. Recheck in 4 weeks.  Call us with any medication changes or concerns, Coumadin clinic # [phone]. Main #  [phone].

## 2018-01-11 ENCOUNTER — Ambulatory Visit (INDEPENDENT_AMBULATORY_CARE_PROVIDER_SITE_OTHER): Payer: Medicare Other | Admitting: Internal Medicine

## 2018-01-11 VITALS — BP 136/70 | HR 74 | Ht 67.0 in | Wt 207.6 lb

## 2018-01-11 DIAGNOSIS — G4733 Obstructive sleep apnea (adult) (pediatric): Secondary | ICD-10-CM

## 2018-01-11 DIAGNOSIS — I480 Paroxysmal atrial fibrillation: Secondary | ICD-10-CM | POA: Diagnosis not present

## 2018-01-11 DIAGNOSIS — I10 Essential (primary) hypertension: Secondary | ICD-10-CM | POA: Diagnosis not present

## 2018-01-11 NOTE — Patient Instructions (Addendum)
Medication Instructions:  Your physician has recommended you make the following change in your medication:   1.  Stop taking SOTALOL   Labwork: None ordered.  Testing/Procedures: None ordered.  Follow-Up: Your physician wants you to follow-up in: 3 months with Dr. Johney FrameAllred.       Any Other Special Instructions Will Be Listed Below (If Applicable).  If you need a refill on your cardiac medications before your next appointment, please call your pharmacy.

## 2018-01-11 NOTE — Progress Notes (Signed)
PCP: Waldon Merl, PA-C Primary Cardiologist: Dr Mayford Knife Primary EP: Dr Kerin Perna Ashley Woodard who presents today for routine electrophysiology followup.  Since last being seen in our clinic, the patient reports doing very well.  She has palpitations which are due to PACs (by ILR).   Today, she denies symptoms of chest pain, shortness of breath,  lower extremity edema, dizziness, presyncope, or syncope.  The patient is otherwise without complaint today.   Past Medical History:  Diagnosis Date  . Abnormal cardiovascular stress test 08/21/2016   a. done for abnl EKG/cardiac risk factors -> admitted for cath 07/2016 which showed no significant CAD, normal LV contraction, mildly elevated filling pressure. Low dose Imdur was added for possible component of microvascular dysfunction.   Marland Kitchen Anxiety   . Arthritis    "back, knees, fingers" (05/10/2017)  . Chronic lower back pain   . Colon polyps   . Deafness in right ear   . Depression   . Diverticulosis   . Hypertension   . Hypertriglyceridemia   . Hypothyroidism   . Migraine    "none in the 2000s" (05/10/2017)  . Paroxysmal atrial fibrillation (HCC)   . Pneumonia    "several times" (05/10/2017)  . PONV (postoperative nausea and vomiting)   . S/P cardiac catheterization, 08/20/16 minimal CAD 08/21/2016  . Sleep apnea    mild per patient; "recently had another test and they said I don't have it" (05/10/2017)  . Type II diabetes mellitus (HCC)   . Voice tremor    Past Surgical History:  Procedure Laterality Date  . ABDOMINAL HYSTERECTOMY  1988  . ATRIAL FIBRILLATION ABLATION N/A 05/10/2017   Procedure: ATRIAL FIBRILLATION ABLATION;  Surgeon: Hillis Range, MD;  Location: [facility] INVASIVE CV LAB;  Service: Cardiovascular;  Laterality: N/A;  . COCHLEAR IMPLANT Right 2008  . LEFT HEART CATH AND CORONARY ANGIOGRAPHY N/A 08/20/2016   Procedure: Left Heart Cath and Coronary Angiography;  Surgeon: Yvonne Kendall, MD;  Location: [facility]  INVASIVE CV LAB;  Service: Cardiovascular;  Laterality: N/A;  . LOOP RECORDER INSERTION N/A 11/29/2017   Procedure: LOOP RECORDER INSERTION;  Surgeon: Hillis Range, MD;  Location: [facility] INVASIVE CV LAB;  Service: Cardiovascular;  Laterality: N/A;    ROS- all systems are reviewed and negatives except as per HPI above  Current Outpatient Medications  Medication Sig Dispense Refill  . amLODipine (NORVASC) 10 MG tablet TAKE 1 TABLET BY MOUTH EVERY DAY 90 tablet 1  . Cholecalciferol (VITAMIN D) 2000 units CAPS Take 1 capsule by mouth daily.     Marland Kitchen doxylamine, Sleep, (SLEEP AID) 25 MG tablet Take 25 mg by mouth at bedtime.    . DULoxetine (CYMBALTA) 20 MG capsule Take 1 capsule (20 mg total) by mouth daily. 30 capsule 3  . glucose blood (CONTOUR NEXT TEST) test strip Check blood sugars once daily 100 each 6  . hydrALAZINE (APRESOLINE) 50 MG tablet Take 1 tablet (50 mg total) by mouth 2 (two) times daily. 180 tablet 1  . hydrochlorothiazide (HYDRODIURIL) 25 MG tablet Take 1 tablet (25 mg total) by mouth daily. 90 tablet 3  . irbesartan (AVAPRO) 150 MG tablet Take 1 tablet (150 mg total) by mouth daily. 90 tablet 3  . LANCETS ULTRA THIN 30G MISC 1 Stick by Does not apply route daily. 100 each 6  . levothyroxine (SYNTHROID, LEVOTHROID) 75 MCG tablet Take 1 tablet (75 mcg total) by mouth daily. 90 tablet 1  . Magnesium 200  MG TABS Take 200 mg by mouth daily.     . metFORMIN (GLUCOPHAGE) 500 MG tablet TAKE 1 TABLET BY MOUTH TWICE A DAY WITH A MEAL 180 tablet 1  . nortriptyline (PAMELOR) 25 MG capsule Take 1 capsule (25 mg total) by mouth at bedtime. 30 capsule 3  . Omega-3 Fatty Acids (FISH OIL) 1000 MG CAPS Take 1,000 mg by mouth daily.     . potassium chloride SA (KLOR-CON M20) 20 MEQ tablet Take 1 tablet (20 mEq total) by mouth 2 (two) times daily. TAKE 1 TABLET BY MOUTH TWICE A DAY 180 tablet 3  . simvastatin (ZOCOR) 20 MG tablet Take 1 tablet (20 mg total) by mouth every Monday, Wednesday, and Friday.  90 tablet 0  . vitamin B-12 (CYANOCOBALAMIN) 1000 MCG tablet Take 1 tablet (1,000 mcg total) by mouth daily. 30 tablet 0  . warfarin (COUMADIN) 5 MG tablet Take 1 tablet (5 mg total) by mouth daily. (Patient taking differently: Take 5 mg by mouth daily. 5 mg Tues - Sunday, Monday is 2.5 mg) 90 tablet 3   No current facility-administered medications for this visit.     Physical Exam: Vitals:   01/11/18 1550  BP: 136/70  Pulse: 74  SpO2: 97%  Weight: 207 lb 9.6 oz (94.2 kg)  Height: 5\' 7"  (1.702 m)    GEN- The patient is well appearing, alert and oriented x 3 today.   Head- normocephalic, atraumatic Eyes-  Sclera clear, conjunctiva pink Ears- hearing intact Oropharynx- clear Lungs- Clear to ausculation bilaterally, normal work of breathing Heart- Regular rate and rhythm, no murmurs, rubs or gallops, PMI not laterally displaced GI- soft, NT, ND, + BS Extremities- no clubbing, cyanosis, or edema  Wt Readings from Last 3 Encounters:  01/11/18 207 lb 9.6 oz (94.2 kg)  12/23/17 208 lb (94.3 kg)  11/29/17 205 lb (93 kg)    EKG tracing ordered today is personally reviewed and shows sinus rhythm 74 bpm, PR 170 msec, QRS 96 msec, QTc 501 msec, nonspecific ST/T changes  Assessment and Plan:  1. Paroxysmal atrial fibrillation She has done well s/p afib ablation Stop sotalol ILR has been implanted and shows 0 % afib since device implant chads2vasc score is 4.  She is on coumadin but wishes to stop this.  We discussed possibly stopping coumadin and then starting DOAC if afib is detected on ILR.  We will re-explore this option on return  2. HTN Stable No change required today  3. Mild OSA followed by Dr Mayford Knifeurner  Return in 3 months Carelink  [name] MD, [facility] 01/11/2018 10:25 PM

## 2018-01-13 ENCOUNTER — Ambulatory Visit: Payer: Medicare Other | Admitting: Physician Assistant

## 2018-01-18 LAB — CUP PACEART INCLINIC DEVICE CHECK
Implantable Pulse Generator Implant Date: 20191008
MDC IDC SESS DTM: 20191127162542

## 2018-01-22 DIAGNOSIS — Z23 Encounter for immunization: Secondary | ICD-10-CM | POA: Diagnosis not present

## 2018-01-27 ENCOUNTER — Other Ambulatory Visit: Payer: Self-pay

## 2018-01-27 ENCOUNTER — Ambulatory Visit (INDEPENDENT_AMBULATORY_CARE_PROVIDER_SITE_OTHER): Payer: Medicare Other | Admitting: Physician Assistant

## 2018-01-27 ENCOUNTER — Encounter: Payer: Self-pay | Admitting: Physician Assistant

## 2018-01-27 VITALS — BP 140/80 | HR 83 | Temp 97.8°F | Resp 14 | Ht 67.0 in | Wt 211.0 lb

## 2018-01-27 DIAGNOSIS — E559 Vitamin D deficiency, unspecified: Secondary | ICD-10-CM | POA: Diagnosis not present

## 2018-01-27 DIAGNOSIS — M5136 Other intervertebral disc degeneration, lumbar region: Secondary | ICD-10-CM | POA: Diagnosis not present

## 2018-01-27 DIAGNOSIS — E538 Deficiency of other specified B group vitamins: Secondary | ICD-10-CM

## 2018-01-27 DIAGNOSIS — R899 Unspecified abnormal finding in specimens from other organs, systems and tissues: Secondary | ICD-10-CM | POA: Diagnosis not present

## 2018-01-27 LAB — COMPREHENSIVE METABOLIC PANEL
ALT: 19 U/L (ref 0–35)
AST: 14 U/L (ref 0–37)
Albumin: 4.4 g/dL (ref 3.5–5.2)
Alkaline Phosphatase: 70 U/L (ref 39–117)
BUN: 16 mg/dL (ref 6–23)
CO2: 27 mEq/L (ref 19–32)
Calcium: 9.6 mg/dL (ref 8.4–10.5)
Chloride: 105 mEq/L (ref 96–112)
Creatinine, Ser: 0.99 mg/dL (ref 0.40–1.20)
GFR: 59.51 mL/min — ABNORMAL LOW (ref >60.00)
GLUCOSE: 130 mg/dL — AB (ref 70–99)
Potassium: 3.6 mEq/L (ref 3.5–5.1)
SODIUM: 141 meq/L (ref 135–145)
TOTAL PROTEIN: 6.9 g/dL (ref 6.0–8.3)
Total Bilirubin: 0.4 mg/dL (ref 0.2–1.2)

## 2018-01-27 LAB — VITAMIN B12: Vitamin B-12: 1122 pg/mL — ABNORMAL HIGH (ref 211–911)

## 2018-01-27 LAB — VITAMIN D 25 HYDROXY (VIT D DEFICIENCY, FRACTURES): VITD: 32.96 ng/mL (ref 30.00–100.00)

## 2018-01-27 LAB — MAGNESIUM: Magnesium: 2.2 mg/dL (ref 1.5–2.5)

## 2018-01-27 MED ORDER — DULOXETINE HCL 30 MG PO CPEP: 30.0000 mg | ORAL_CAPSULE | Freq: Every day | ORAL | 3 refills | Status: DC

## 2018-01-27 NOTE — Patient Instructions (Signed)
Please go to the lab today for blood work.  I will call you with your results. We will alter treatment regimen(s) if indicated by your results.   Please continue medications as directed. Follow-up with me in 3 months.

## 2018-01-27 NOTE — Progress Notes (Signed)
Patient presents to clinic today for follow-up of chronic back pain with resumption of Cymbalta daily. Patient endorses taking medications as directed. Notes significant improvement in back pain. Did notify her coumadin clinic about medication change. Has been getting weekly INR which has normalized quickly. Has a follow-up later this week and if remaining stable, will go back to once monthly INR checks. Is very happy with noted change from medication.  Past Medical History:  Diagnosis Date  . Abnormal cardiovascular stress test 08/21/2016   a. done for abnl EKG/cardiac risk factors -> admitted for cath 07/2016 which showed no significant CAD, normal LV contraction, mildly elevated filling pressure. Low dose Imdur was added for possible component of microvascular dysfunction.   Marland Kitchen. Anxiety   . Arthritis    "back, knees, fingers" (05/10/2017)  . Chronic lower back pain   . Colon polyps   . Deafness in right ear   . Depression   . Diverticulosis   . Hypertension   . Hypertriglyceridemia   . Hypothyroidism   . Migraine    "none in the 2000s" (05/10/2017)  . Paroxysmal atrial fibrillation (HCC)   . Pneumonia    "several times" (05/10/2017)  . PONV (postoperative nausea and vomiting)   . S/P cardiac catheterization, 08/20/16 minimal CAD 08/21/2016  . Sleep apnea    mild per patient; "recently had another test and they said I don't have it" (05/10/2017)  . Type II diabetes mellitus (HCC)   . Voice tremor     Current Outpatient Medications on File Prior to Visit  Medication Sig Dispense Refill  . amLODipine (NORVASC) 10 MG tablet TAKE 1 TABLET BY MOUTH EVERY DAY 90 tablet 1  . Cholecalciferol (VITAMIN D) 2000 units CAPS Take 1 capsule by mouth daily.     Marland Kitchen. doxylamine, Sleep, (SLEEP AID) 25 MG tablet Take 25 mg by mouth at bedtime.    Marland Kitchen. glucose blood (CONTOUR NEXT TEST) test strip Check blood sugars once daily 100 each 6  . hydrALAZINE (APRESOLINE) 50 MG tablet Take 1 tablet (50 mg total) by  mouth 2 (two) times daily. 180 tablet 1  . hydrochlorothiazide (HYDRODIURIL) 25 MG tablet Take 1 tablet (25 mg total) by mouth daily. 90 tablet 3  . irbesartan (AVAPRO) 150 MG tablet Take 1 tablet (150 mg total) by mouth daily. 90 tablet 3  . LANCETS ULTRA THIN 30G MISC 1 Stick by Does not apply route daily. 100 each 6  . levothyroxine (SYNTHROID, LEVOTHROID) 75 MCG tablet Take 1 tablet (75 mcg total) by mouth daily. 90 tablet 1  . Magnesium 200 MG TABS Take 200 mg by mouth daily.     . metFORMIN (GLUCOPHAGE) 500 MG tablet TAKE 1 TABLET BY MOUTH TWICE A DAY WITH A MEAL 180 tablet 1  . Omega-3 Fatty Acids (FISH OIL) 1000 MG CAPS Take 1,000 mg by mouth daily.     . potassium chloride SA (KLOR-CON M20) 20 MEQ tablet Take 1 tablet (20 mEq total) by mouth 2 (two) times daily. TAKE 1 TABLET BY MOUTH TWICE A DAY 180 tablet 3  . simvastatin (ZOCOR) 20 MG tablet Take 1 tablet (20 mg total) by mouth every Monday, Wednesday, and Friday. 90 tablet 0  . vitamin B-12 (CYANOCOBALAMIN) 1000 MCG tablet Take 1 tablet (1,000 mcg total) by mouth daily. 30 tablet 0  . warfarin (COUMADIN) 5 MG tablet Take 1 tablet (5 mg total) by mouth daily. (Patient taking differently: Take 5 mg by mouth daily. 5 mg Tues -  Sunday, Monday is 2.5 mg) 90 tablet 3   No current facility-administered medications on file prior to visit.     Allergies  Allergen Reactions  . Demeclocycline Rash  . Doxycycline Rash  . Erythromycin Rash  . Lisinopril Cough  . Tetracyclines & Related Rash    Family History  Problem Relation Age of Onset  . Hypertension Mother   . Diabetes Mother   . Heart attack Mother   . Hypertension Father   . Heart attack Father   . Stroke Father   . Breast cancer Sister 29  . Hypertension Brother   . Cancer Brother        Prostate  . Diabetes Brother   . Dementia Brother   . Stroke Brother   . Heart attack Brother   . Hypertension Son   . Breast cancer Maternal Aunt        in 7's  . Cancer Maternal  Uncle        Lung    Social History   Socioeconomic History  . Marital status: Married    Spouse name: Not on file  . Number of children: 2  . Years of education: 45  . Highest education level: Not on file  Occupational History  . Occupation: Art therapist  Social Needs  . Financial resource strain: Not on file  . Food insecurity:    Worry: Not on file    Inability: Not on file  . Transportation needs:    Medical: Not on file    Non-medical: Not on file  Tobacco Use  . Smoking status: Never Smoker  . Smokeless tobacco: Never Used  Substance and Sexual Activity  . Alcohol use: No  . Drug use: No  . Sexual activity: Not Currently  Lifestyle  . Physical activity:    Days per week: Not on file    Minutes per session: Not on file  . Stress: Not on file  Relationships  . Social connections:    Talks on phone: Not on file    Gets together: Not on file    Attends religious service: Not on file    Active member of club or organization: Not on file    Attends meetings of clubs or organizations: Not on file    Relationship status: Not on file  Other Topics Concern  . Not on file  Social History Narrative   Lives at home with husband in [place].   Right-handed.   No caffeine use.   Manages a call center    Review of Systems - See HPI.  All other ROS are negative.  BP 140/80   Pulse 83   Temp 97.8 F (36.6 C) (Oral)   Resp 14   Ht 5\' 7"  (1.702 m)   Wt 211 lb (95.7 kg)   SpO2 98%   BMI 33.05 kg/m   Physical Exam  Constitutional: She is oriented to person, place, and time. She appears well-developed and well-nourished.  HENT:  Head: Normocephalic and atraumatic.  Cardiovascular: Normal rate, regular rhythm and normal heart sounds.  Pulmonary/Chest: Effort normal and breath sounds normal.  Neurological: She is alert and oriented to person, place, and time.  Psychiatric: She has a normal mood and affect.  Vitals reviewed.   Recent Results (from the past 2160  hour(s))  POCT INR     Status: Abnormal   Collection Time: 11/11/17  3:51 PM  Result Value Ref Range   INR 4.1 (A) 2.0 - 3.0  POCT  INR     Status: Abnormal   Collection Time: 11/21/17  3:56 PM  Result Value Ref Range   INR 3.5 (A) 2.0 - 3.0  POCT INR     Status: Abnormal   Collection Time: 12/09/17  1:34 PM  Result Value Ref Range   INR 3.4 (A) 2.0 - 3.0  CUP PACEART INCLINIC DEVICE CHECK     Status: None   Collection Time: 12/12/17  7:54 PM  Result Value Ref Range   Date Time Interrogation Session [phone]    Pulse Generator Manufacturer MERM    Pulse Gen Model LNQ11 Reveal LINQ    Pulse Gen Serial Number [id number]    Clinic Name [facility]    Implantable Pulse Generator Type ICM/ILR    Implantable Pulse Generator Implant Date [id number]    Battery Status OK    Eval Rhythm Vs 65   POCT HgB A1C     Status: Abnormal   Collection Time: 12/23/17  2:14 PM  Result Value Ref Range   Hemoglobin A1C 6.2 (A) 4.0 - 5.6 %   HbA1c POC (<> result, manual entry)     HbA1c, POC (prediabetic range)     HbA1c, POC (controlled diabetic range)    Basic metabolic panel     Status: Abnormal   Collection Time: 12/23/17  2:39 PM  Result Value Ref Range   Sodium 142 135 - 145 mEq/L   Potassium 3.8 3.5 - 5.1 mEq/L   Chloride 106 96 - 112 mEq/L   CO2 28 19 - 32 mEq/L   Glucose, Bld 109 (H) 70 - 99 mg/dL   BUN 18 6 - 23 mg/dL   Creatinine, Ser 7.84 0.40 - 1.20 mg/dL   Calcium 9.5 8.4 - 69.6 mg/dL   GFR 29.52 >84.13 mL/min  POCT INR     Status: None   Collection Time: 12/23/17  3:30 PM  Result Value Ref Range   INR 2.8 2.0 - 3.0  POCT INR     Status: None   Collection Time: 01/06/18  3:23 PM  Result Value Ref Range   INR 2.4 2.0 - 3.0  CUP PACEART INCLINIC DEVICE CHECK     Status: None   Collection Time: 01/18/18  9:20 PM  Result Value Ref Range   Pulse Generator Manufacturer MERM    Date Time Interrogation Session [phone]    Pulse Gen Model LNQ11 Reveal LINQ    Pulse  Gen Serial Number [id number]    Clinic Name [facility]    Implantable Pulse Generator Type ICM/ILR    Implantable Pulse Generator Implant Date [id number]    Eval Rhythm NSB   Comprehensive metabolic panel     Status: Abnormal   Collection Time: 01/27/18  2:38 PM  Result Value Ref Range   Sodium 141 135 - 145 mEq/L   Potassium 3.6 3.5 - 5.1 mEq/L   Chloride 105 96 - 112 mEq/L   CO2 27 19 - 32 mEq/L   Glucose, Bld 130 (H) 70 - 99 mg/dL   BUN 16 6 - 23 mg/dL   Creatinine, Ser 3.32 0.40 - 1.20 mg/dL   Total Bilirubin 0.4 0.2 - 1.2 mg/dL   Alkaline Phosphatase 70 39 - 117 U/L   AST 14 0 - 37 U/L   ALT 19 0 - 35 U/L   Total Protein 6.9 6.0 - 8.3 g/dL   Albumin 4.4 3.5 - 5.2 g/dL   Calcium 9.6 8.4 - 95.1 mg/dL   GFR 88.41 (L) >  60.00 mL/min  Magnesium     Status: None   Collection Time: 01/27/18  2:38 PM  Result Value Ref Range   Magnesium 2.2 1.5 - 2.5 mg/dL  Z61     Status: Abnormal   Collection Time: 01/27/18  2:38 PM  Result Value Ref Range   Vitamin B-12 1,122 (H) 211 - 911 pg/mL  Vitamin D (25 hydroxy)     Status: None   Collection Time: 01/27/18  2:38 PM  Result Value Ref Range   VITD 32.96 30.00 - 100.00 ng/mL    Assessment/Plan: DDD (degenerative disc disease), lumbar Patient noting marked improvement in pain. Mood stable. Continue Cymbalta at current dose.   Patient with history of Vitamin D deficiency and B12 deficiency. Is requesting repeat labs today. Lab orders placed.   Piedad Climes, PA-C

## 2018-01-29 NOTE — Assessment & Plan Note (Signed)
Patient noting marked improvement in pain. Mood stable. Continue Cymbalta at current dose.

## 2018-02-03 ENCOUNTER — Ambulatory Visit (INDEPENDENT_AMBULATORY_CARE_PROVIDER_SITE_OTHER): Payer: Medicare Other

## 2018-02-03 ENCOUNTER — Ambulatory Visit (INDEPENDENT_AMBULATORY_CARE_PROVIDER_SITE_OTHER): Payer: Medicare Other | Admitting: *Deleted

## 2018-02-03 DIAGNOSIS — Z5181 Encounter for therapeutic drug level monitoring: Secondary | ICD-10-CM

## 2018-02-03 DIAGNOSIS — I4819 Other persistent atrial fibrillation: Secondary | ICD-10-CM

## 2018-02-03 DIAGNOSIS — I480 Paroxysmal atrial fibrillation: Secondary | ICD-10-CM

## 2018-02-03 LAB — POCT INR: INR: 2.2 (ref 2.0–3.0)

## 2018-02-06 NOTE — Progress Notes (Signed)
Carelink Summary Report / Loop Recorder 

## 2018-02-18 LAB — CUP PACEART REMOTE DEVICE CHECK
Date Time Interrogation Session: 20191110113528
MDC IDC PG IMPLANT DT: 20191008

## 2018-03-03 ENCOUNTER — Ambulatory Visit (INDEPENDENT_AMBULATORY_CARE_PROVIDER_SITE_OTHER): Payer: Medicare Other | Admitting: *Deleted

## 2018-03-03 DIAGNOSIS — I4819 Other persistent atrial fibrillation: Secondary | ICD-10-CM | POA: Diagnosis not present

## 2018-03-03 DIAGNOSIS — Z5181 Encounter for therapeutic drug level monitoring: Secondary | ICD-10-CM

## 2018-03-03 LAB — POCT INR: INR: 1.9 — AB (ref 2.0–3.0)

## 2018-03-03 NOTE — Patient Instructions (Signed)
Description   Today take 1 tablet then continue taking 1 tablet daily except 1/2 tablet on Mondays and Fridays. Recheck in 4 weeks.  Call us with any medication changes or concerns, Coumadin clinic # [phone]. Main #  [phone].

## 2018-03-08 ENCOUNTER — Ambulatory Visit (INDEPENDENT_AMBULATORY_CARE_PROVIDER_SITE_OTHER): Payer: Medicare Other

## 2018-03-08 DIAGNOSIS — I480 Paroxysmal atrial fibrillation: Secondary | ICD-10-CM

## 2018-03-09 NOTE — Progress Notes (Signed)
Carelink Summary Report / Loop Recorder 

## 2018-03-10 LAB — CUP PACEART REMOTE DEVICE CHECK
Date Time Interrogation Session: 20200115113740
MDC IDC PG IMPLANT DT: 20191008

## 2018-03-12 LAB — CUP PACEART REMOTE DEVICE CHECK
MDC IDC PG IMPLANT DT: 20191008
MDC IDC SESS DTM: 20191213113812

## 2018-03-31 ENCOUNTER — Ambulatory Visit (INDEPENDENT_AMBULATORY_CARE_PROVIDER_SITE_OTHER): Payer: Medicare Other | Admitting: Pharmacist

## 2018-03-31 DIAGNOSIS — I4819 Other persistent atrial fibrillation: Secondary | ICD-10-CM

## 2018-03-31 DIAGNOSIS — Z5181 Encounter for therapeutic drug level monitoring: Secondary | ICD-10-CM | POA: Diagnosis not present

## 2018-03-31 LAB — POCT INR: INR: 1.7 — AB (ref 2.0–3.0)

## 2018-03-31 NOTE — Patient Instructions (Signed)
Description   Today take 1 tablet then start taking 1 tablet daily except 1/2 tablet on Fridays. Recheck in 3 weeks.  Call us with any medication changes or concerns, Coumadin clinic # [phone]. Main #  [phone].

## 2018-04-10 ENCOUNTER — Ambulatory Visit (INDEPENDENT_AMBULATORY_CARE_PROVIDER_SITE_OTHER): Payer: Medicare Other

## 2018-04-10 DIAGNOSIS — I4819 Other persistent atrial fibrillation: Secondary | ICD-10-CM | POA: Diagnosis not present

## 2018-04-10 LAB — CUP PACEART REMOTE DEVICE CHECK
Date Time Interrogation Session: 20200217143925
Implantable Pulse Generator Implant Date: 20191008

## 2018-04-13 ENCOUNTER — Encounter: Payer: Self-pay | Admitting: Internal Medicine

## 2018-04-13 ENCOUNTER — Ambulatory Visit (INDEPENDENT_AMBULATORY_CARE_PROVIDER_SITE_OTHER): Payer: Medicare Other | Admitting: Internal Medicine

## 2018-04-13 ENCOUNTER — Other Ambulatory Visit: Payer: Self-pay | Admitting: Physician Assistant

## 2018-04-13 VITALS — BP 132/78 | HR 80 | Ht 67.0 in | Wt 206.0 lb

## 2018-04-13 DIAGNOSIS — I480 Paroxysmal atrial fibrillation: Secondary | ICD-10-CM

## 2018-04-13 DIAGNOSIS — I10 Essential (primary) hypertension: Secondary | ICD-10-CM | POA: Diagnosis not present

## 2018-04-13 DIAGNOSIS — G4733 Obstructive sleep apnea (adult) (pediatric): Secondary | ICD-10-CM

## 2018-04-13 MED ORDER — METFORMIN HCL 500 MG PO TABS: ORAL_TABLET | ORAL | 1 refills | Status: DC

## 2018-04-13 MED ORDER — AMLODIPINE BESYLATE 10 MG PO TABS: ORAL_TABLET | ORAL | 1 refills | Status: DC

## 2018-04-13 MED ORDER — LEVOTHYROXINE SODIUM 75 MCG PO TABS: 75.0000 ug | ORAL_TABLET | Freq: Every day | ORAL | 1 refills | Status: DC

## 2018-04-13 MED ORDER — SIMVASTATIN 20 MG PO TABS: 20.0000 mg | ORAL_TABLET | ORAL | 0 refills | Status: DC

## 2018-04-13 NOTE — Patient Instructions (Addendum)
Medication Instructions:  Your physician has recommended you make the following change in your medication:   1.  Stop taking warfarin   Labwork: None ordered.  Testing/Procedures: None ordered.  Follow-Up: Your physician wants you to follow-up in: 4 months with Dr. Johney Frame.     Monthly remote monitoring   Any Other Special Instructions Will Be Listed Below (If Applicable).  If you need a refill on your cardiac medications before your next appointment, please call your pharmacy.

## 2018-04-13 NOTE — Progress Notes (Signed)
PCP: Waldon Merl, PA-C Primary Cardiologist: Dr Mayford Knife Primary EP: Dr Kerin Perna Soden is a 68 y.o. female who presents today for routine electrophysiology followup.  Since last being seen in our clinic, the patient reports doing very well.  Today, she denies symptoms of palpitations, chest pain, shortness of breath,  lower extremity edema, dizziness, presyncope, or syncope.  The patient is otherwise without complaint today.   Past Medical History:  Diagnosis Date  . Abnormal cardiovascular stress test 08/21/2016   a. done for abnl EKG/cardiac risk factors -> admitted for cath 07/2016 which showed no significant CAD, normal LV contraction, mildly elevated filling pressure. Low dose Imdur was added for possible component of microvascular dysfunction.   Marland Kitchen Anxiety   . Arthritis    "back, knees, fingers" (05/10/2017)  . Chronic lower back pain   . Colon polyps   . Deafness in right ear   . Depression   . Diverticulosis   . Hypertension   . Hypertriglyceridemia   . Hypothyroidism   . Migraine    "none in the 2000s" (05/10/2017)  . Paroxysmal atrial fibrillation (HCC)   . Pneumonia    "several times" (05/10/2017)  . PONV (postoperative nausea and vomiting)   . S/P cardiac catheterization, 08/20/16 minimal CAD 08/21/2016  . Sleep apnea    mild per patient; "recently had another test and they said I don't have it" (05/10/2017)  . Type II diabetes mellitus (HCC)   . Voice tremor    Past Surgical History:  Procedure Laterality Date  . ABDOMINAL HYSTERECTOMY  1988  . ATRIAL FIBRILLATION ABLATION N/A 05/10/2017   Procedure: ATRIAL FIBRILLATION ABLATION;  Surgeon: Hillis Range, MD;  Location: [facility] INVASIVE [facility] LAB;  Service: Cardiovascular;  Laterality: N/A;  . COCHLEAR IMPLANT Right 2008  . LEFT HEART CATH AND CORONARY ANGIOGRAPHY N/A 08/20/2016   Procedure: Left Heart Cath and Coronary Angiography;  Surgeon: Yvonne Kendall, MD;  Location: [facility] INVASIVE CV LAB;  Service:  Cardiovascular;  Laterality: N/A;  . LOOP RECORDER INSERTION N/A 11/29/2017   Procedure: LOOP RECORDER INSERTION;  Surgeon: Hillis Range, MD;  Location: [facility] INVASIVE CV LAB;  Service: Cardiovascular;  Laterality: N/A;    ROS- all systems are reviewed and negatives except as per HPI above  Current Outpatient Medications  Medication Sig Dispense Refill  . amLODipine (NORVASC) 10 MG tablet TAKE 1 TABLET BY MOUTH EVERY DAY 90 tablet 1  . Cholecalciferol (VITAMIN D) 2000 units CAPS Take 1 capsule by mouth daily.     Marland Kitchen doxylamine, Sleep, (SLEEP AID) 25 MG tablet Take 25 mg by mouth at bedtime.    . DULoxetine (CYMBALTA) 30 MG capsule Take 1 capsule (30 mg total) by mouth daily. 30 capsule 3  . glucose blood (CONTOUR NEXT TEST) test strip Check blood sugars once daily 100 each 6  . hydrALAZINE (APRESOLINE) 50 MG tablet Take 1 tablet (50 mg total) by mouth 2 (two) times daily. 180 tablet 1  . hydrochlorothiazide (HYDRODIURIL) 25 MG tablet Take 1 tablet (25 mg total) by mouth daily. 90 tablet 3  . irbesartan (AVAPRO) 150 MG tablet Take 1 tablet (150 mg total) by mouth daily. 90 tablet 3  . LANCETS ULTRA THIN 30G MISC 1 Stick by Does not apply route daily. 100 each 6  . levothyroxine (SYNTHROID, LEVOTHROID) 75 MCG tablet Take 1 tablet (75 mcg total) by mouth daily. 90 tablet 1  . Magnesium 200 MG TABS Take 200 mg by mouth daily.     Marland Kitchen  metFORMIN (GLUCOPHAGE) 500 MG tablet TAKE 1 TABLET BY MOUTH TWICE A DAY WITH A MEAL 180 tablet 1  . Omega-3 Fatty Acids (FISH OIL) 1000 MG CAPS Take 1,000 mg by mouth daily.     . potassium chloride SA (KLOR-CON M20) 20 MEQ tablet Take 1 tablet (20 mEq total) by mouth 2 (two) times daily. TAKE 1 TABLET BY MOUTH TWICE A DAY 180 tablet 3  . [START ON 04/14/2018] simvastatin (ZOCOR) 20 MG tablet Take 1 tablet (20 mg total) by mouth every Monday, Wednesday, and Friday. 90 tablet 0  . vitamin B-12 (CYANOCOBALAMIN) 1000 MCG tablet Take 1 tablet (1,000 mcg total) by mouth daily.  30 tablet 0  . warfarin (COUMADIN) 5 MG tablet Take 1 tablet (5 mg total) by mouth daily. (Patient taking differently: Take 5 mg by mouth daily. 5 mg Tues - Sunday, Monday is 2.5 mg) 90 tablet 3   No current facility-administered medications for this visit.     Physical Exam: Vitals:   04/13/18 1603  BP: 132/78  Pulse: 80  SpO2: 95%  Weight: 206 lb (93.4 kg)  Height: 5\' 7"  (1.702 m)    GEN- The patient is well appearing, alert and oriented x 3 today.   Head- normocephalic, atraumatic Eyes-  Sclera clear, conjunctiva pink Ears- hearing intact Oropharynx- clear Lungs- Clear to ausculation bilaterally, normal work of breathing Heart- Regular rate and rhythm, no murmurs, rubs or gallops, PMI not laterally displaced GI- soft, NT, ND, + BS Extremities- no clubbing, cyanosis, or edema  Wt Readings from Last 3 Encounters:  04/13/18 206 lb (93.4 kg)  01/27/18 211 lb (95.7 kg)  01/11/18 207 lb 9.6 oz (94.2 kg)    EKG tracing ordered today is personally reviewed and shows sinus rhythm 80 bpm, PR 170 msec, QRS 106 msec, QTc 470 msec, nonspecific St/T changes  Assessment and Plan:  1. Paroxysmal atrial fibrillation Doing well s/p ablation off AAD therapy ILR reveals 0% AF burden chads2vasc score is 4.  She wishes to stop anticoagulation at this time.  If we find AF on her ILR, we would start DOAC therapy then.  2. HTN Stable No change required today  3. Mild OSA Followed by Dr Amada Kingfisher Return in 4 months  Hillis Range MD, [facility] 04/13/2018 4:06 PM

## 2018-04-14 LAB — CUP PACEART INCLINIC DEVICE CHECK
Implantable Pulse Generator Implant Date: 20191008
MDC IDC SESS DTM: 20200220220552

## 2018-04-19 NOTE — Progress Notes (Signed)
Carelink Summary Report / Loop Recorder 

## 2018-04-26 ENCOUNTER — Other Ambulatory Visit: Payer: Self-pay | Admitting: Physician Assistant

## 2018-04-27 ENCOUNTER — Other Ambulatory Visit: Payer: Self-pay | Admitting: Emergency Medicine

## 2018-04-27 MED ORDER — HYDRALAZINE HCL 50 MG PO TABS: 50.0000 mg | ORAL_TABLET | Freq: Two times a day (BID) | ORAL | 1 refills | Status: DC

## 2018-04-28 ENCOUNTER — Encounter: Payer: Self-pay | Admitting: Physician Assistant

## 2018-04-28 ENCOUNTER — Ambulatory Visit (INDEPENDENT_AMBULATORY_CARE_PROVIDER_SITE_OTHER): Payer: Medicare Other | Admitting: Physician Assistant

## 2018-04-28 ENCOUNTER — Other Ambulatory Visit: Payer: Self-pay

## 2018-04-28 VITALS — BP 118/72 | HR 78 | Temp 97.8°F | Resp 14 | Ht 67.0 in | Wt 203.0 lb

## 2018-04-28 DIAGNOSIS — Z1211 Encounter for screening for malignant neoplasm of colon: Secondary | ICD-10-CM | POA: Diagnosis not present

## 2018-04-28 DIAGNOSIS — E119 Type 2 diabetes mellitus without complications: Secondary | ICD-10-CM

## 2018-04-28 DIAGNOSIS — M5136 Other intervertebral disc degeneration, lumbar region: Secondary | ICD-10-CM

## 2018-04-28 DIAGNOSIS — I10 Essential (primary) hypertension: Secondary | ICD-10-CM

## 2018-04-28 LAB — BASIC METABOLIC PANEL
BUN: 19 mg/dL (ref 6–23)
CO2: 28 mEq/L (ref 19–32)
CREATININE: 0.89 mg/dL (ref 0.40–1.20)
Calcium: 10.2 mg/dL (ref 8.4–10.5)
Chloride: 105 mEq/L (ref 96–112)
GFR: 63.27 mL/min (ref >60.00)
GLUCOSE: 99 mg/dL (ref 70–99)
POTASSIUM: 4.1 meq/L (ref 3.5–5.1)
Sodium: 141 mEq/L (ref 135–145)

## 2018-04-28 LAB — HEMOGLOBIN A1C: Hgb A1c MFr Bld: 6.1 % (ref 4.6–6.5)

## 2018-04-28 NOTE — Assessment & Plan Note (Signed)
Doing very well overall with current regimen. Continue same. Follow-up with specialist as scheduled.

## 2018-04-28 NOTE — Assessment & Plan Note (Signed)
Foot exam updated. Other parameters up-to-date. Will check fasting labs today.

## 2018-04-28 NOTE — Progress Notes (Signed)
History of Present Illness: Patient is a 68 y.o. female who presents to clinic today for follow-up of Diabetes Mellitus II, previously well-controlled without complication.  Patient currently on medication regimen of Metformin 500 mg BID.  Endorses taking medications as directed. Endorses trying to keep a well-balanced, low-carb diet. Is really watching portion sizes..  Denies vision changes, polyuria, numbness or tingljng of extremity. . Is not checking blood glucose as directed.   Latest Maintenance: A1C --  Lab Results  Component Value Date   HGBA1C 6.2 (A) 12/23/2017   Diabetic Eye Exam -- up-to-date Foot Exam -- Due. Denies concerns today.  Patient is also following up regarding chronic pain and depressed mood. Is currently on a regimen of Cymbalta 30 mg QD. Endorses taking as directed, noting significant improvement in pain and mood. Denies breakthrough anxiety/depressed mood. Denies SI/HI. Has been having some mid tailbone pain from time to time but is seeing a chiropractor for this.  Patient also due for repeat colonoscopy. Last in 2015 with polyps. Would like to be switched to a [facility] provider if possible.  Past Medical History:  Diagnosis Date  . Abnormal cardiovascular stress test 08/21/2016   a. done for abnl EKG/cardiac risk factors -> admitted for cath 07/2016 which showed no significant CAD, normal LV contraction, mildly elevated filling pressure. Low dose Imdur was added for possible component of microvascular dysfunction.   Marland Kitchen Anxiety   . Arthritis    "back, knees, fingers" (05/10/2017)  . Chronic lower back pain   . Colon polyps   . Deafness in right ear   . Depression   . Diverticulosis   . Hypertension   . Hypertriglyceridemia   . Hypothyroidism   . Migraine    "none in the 2000s" (05/10/2017)  . Paroxysmal atrial fibrillation (HCC)   . Pneumonia    "several times" (05/10/2017)  . PONV (postoperative nausea and vomiting)   . S/P cardiac catheterization,  08/20/16 minimal CAD 08/21/2016  . Sleep apnea    mild per patient; "recently had another test and they said I don't have it" (05/10/2017)  . Type II diabetes mellitus (HCC)   . Voice tremor     Current Outpatient Medications on File Prior to Visit  Medication Sig Dispense Refill  . amLODipine (NORVASC) 10 MG tablet TAKE 1 TABLET BY MOUTH EVERY DAY 90 tablet 1  . Cholecalciferol (VITAMIN D) 2000 units CAPS Take 1 capsule by mouth daily.     Marland Kitchen doxylamine, Sleep, (SLEEP AID) 25 MG tablet Take 25 mg by mouth at bedtime.    . DULoxetine (CYMBALTA) 30 MG capsule TAKE 1 CAPSULE DAILY 90 capsule 1  . glucose blood (CONTOUR NEXT TEST) test strip Check blood sugars once daily 100 each 6  . hydrALAZINE (APRESOLINE) 50 MG tablet Take 1 tablet (50 mg total) by mouth 2 (two) times daily. 180 tablet 1  . hydrochlorothiazide (HYDRODIURIL) 25 MG tablet Take 1 tablet (25 mg total) by mouth daily. 90 tablet 3  . irbesartan (AVAPRO) 150 MG tablet Take 1 tablet (150 mg total) by mouth daily. 90 tablet 3  . LANCETS ULTRA THIN 30G MISC 1 Stick by Does not apply route daily. 100 each 6  . levothyroxine (SYNTHROID, LEVOTHROID) 75 MCG tablet Take 1 tablet (75 mcg total) by mouth daily. 90 tablet 1  . Magnesium 200 MG TABS Take 200 mg by mouth daily.     . metFORMIN (GLUCOPHAGE) 500 MG tablet TAKE 1 TABLET BY MOUTH TWICE A DAY WITH  A MEAL 180 tablet 1  . Omega-3 Fatty Acids (FISH OIL) 1000 MG CAPS Take 1,000 mg by mouth daily.     . potassium chloride SA (KLOR-CON M20) 20 MEQ tablet Take 1 tablet (20 mEq total) by mouth 2 (two) times daily. TAKE 1 TABLET BY MOUTH TWICE A DAY 180 tablet 3  . simvastatin (ZOCOR) 20 MG tablet Take 1 tablet (20 mg total) by mouth every Monday, Wednesday, and Friday. 90 tablet 0  . vitamin B-12 (CYANOCOBALAMIN) 1000 MCG tablet Take 1 tablet (1,000 mcg total) by mouth daily. 30 tablet 0   No current facility-administered medications on file prior to visit.     Allergies  Allergen  Reactions  . Demeclocycline Rash  . Doxycycline Rash  . Erythromycin Rash  . Lisinopril Cough  . Tetracyclines & Related Rash    Family History  Problem Relation Age of Onset  . Hypertension Mother   . Diabetes Mother   . Heart attack Mother   . Hypertension Father   . Heart attack Father   . Stroke Father   . Breast cancer Sister 49  . Hypertension Brother   . Cancer Brother        Prostate  . Diabetes Brother   . Dementia Brother   . Stroke Brother   . Heart attack Brother   . Hypertension Son   . Breast cancer Maternal Aunt        in 5's  . Cancer Maternal Uncle        Lung    Social History   Socioeconomic History  . Marital status: Married    Spouse name: Not on file  . Number of children: 2  . Years of education: 47  . Highest education level: Not on file  Occupational History  . Occupation: Art therapist  Social Needs  . Financial resource strain: Not on file  . Food insecurity:    Worry: Not on file    Inability: Not on file  . Transportation needs:    Medical: Not on file    Non-medical: Not on file  Tobacco Use  . Smoking status: Never Smoker  . Smokeless tobacco: Never Used  Substance and Sexual Activity  . Alcohol use: No  . Drug use: No  . Sexual activity: Not Currently  Lifestyle  . Physical activity:    Days per week: Not on file    Minutes per session: Not on file  . Stress: Not on file  Relationships  . Social connections:    Talks on phone: Not on file    Gets together: Not on file    Attends religious service: Not on file    Active member of club or organization: Not on file    Attends meetings of clubs or organizations: Not on file    Relationship status: Not on file  Other Topics Concern  . Not on file  Social History Narrative   Lives at home with husband in [place].   Right-handed.   No caffeine use.   Manages a call center   Review of Systems: Pertinent ROS are listed in HPI  Physical Examination: BP 118/72    Pulse 78   Temp 97.8 F (36.6 C) (Oral)   Resp 14   Ht 5\' 7"  (1.702 m)   Wt 203 lb (92.1 kg)   SpO2 98%   BMI 31.79 kg/m  General appearance: alert, cooperative, appears stated age and no distress Head: Normocephalic, without obvious abnormality, atraumatic Lungs: clear  to auscultation bilaterally Heart: regular rate and rhythm, S1, S2 normal, no murmur, click, rub or gallop Extremities: extremities normal, atraumatic, no cyanosis or edema Pulses: 2+ and symmetric Skin: Skin color, texture, turgor normal. No rashes or lesions Neurologic: Alert and oriented X 3, normal strength and tone. Normal symmetric reflexes. Normal coordination and gait  Diabetic Foot Exam - Simple   Simple Foot Form Diabetic Foot exam was performed with the following findings:  Yes 04/28/2018 12:04 PM  Visual Inspection No deformities, no ulcerations, no other skin breakdown bilaterally:  Yes Sensation Testing Intact to touch and monofilament testing bilaterally:  Yes Pulse Check Posterior Tibialis and Dorsalis pulse intact bilaterally:  Yes Comments    Assessment/Plan: Hypertension BP stable. Continue current regimen.  Diabetes mellitus, type 2 (HCC) Foot exam updated. Other parameters up-to-date. Will check fasting labs today.  DDD (degenerative disc disease), lumbar Doing very well overall with current regimen. Continue same. Follow-up with specialist as scheduled.

## 2018-04-28 NOTE — Patient Instructions (Signed)
Please go to the lab today for blood work.  I will call you with your results. We will alter treatment regimen(s) if indicated by your results.   You will be contacted for assessment by [facility] for your screening colonoscopy.  Keep up with visits to the Chiropractor. It will take some time to see noted improvement. I would get a topical Salon Pas patch to apply to the area if needed.  Hang in there!

## 2018-04-28 NOTE — Assessment & Plan Note (Signed)
BP stable. Continue current regimen.  

## 2018-05-04 ENCOUNTER — Encounter: Payer: Self-pay | Admitting: Physician Assistant

## 2018-05-05 ENCOUNTER — Other Ambulatory Visit: Payer: Self-pay | Admitting: General Practice

## 2018-05-05 MED ORDER — HYDRALAZINE HCL 50 MG PO TABS: 50.0000 mg | ORAL_TABLET | Freq: Two times a day (BID) | ORAL | 1 refills | Status: DC

## 2018-05-05 MED ORDER — HYDRALAZINE HCL 50 MG PO TABS: 50.0000 mg | ORAL_TABLET | Freq: Two times a day (BID) | ORAL | 0 refills | Status: DC

## 2018-05-08 DIAGNOSIS — H9041 Sensorineural hearing loss, unilateral, right ear, with unrestricted hearing on the contralateral side: Secondary | ICD-10-CM | POA: Diagnosis not present

## 2018-05-15 ENCOUNTER — Ambulatory Visit (INDEPENDENT_AMBULATORY_CARE_PROVIDER_SITE_OTHER): Payer: Medicare Other | Admitting: *Deleted

## 2018-05-15 ENCOUNTER — Other Ambulatory Visit: Payer: Self-pay

## 2018-05-15 DIAGNOSIS — I480 Paroxysmal atrial fibrillation: Secondary | ICD-10-CM | POA: Diagnosis not present

## 2018-05-15 DIAGNOSIS — I10 Essential (primary) hypertension: Secondary | ICD-10-CM

## 2018-05-15 LAB — CUP PACEART REMOTE DEVICE CHECK
Implantable Pulse Generator Implant Date: 20191008
MDC IDC SESS DTM: 20200321143539

## 2018-05-16 ENCOUNTER — Other Ambulatory Visit: Payer: Self-pay | Admitting: Physician Assistant

## 2018-05-16 MED ORDER — HYDRALAZINE HCL 50 MG PO TABS: 50.0000 mg | ORAL_TABLET | Freq: Two times a day (BID) | ORAL | 1 refills | Status: DC

## 2018-05-16 NOTE — Telephone Encounter (Signed)
Called [facility] to verify Hydralazine refill. They did not receive the rx on 05/05/18 Gave a verbal order for 90 day supply with 1 rf.

## 2018-05-16 NOTE — Telephone Encounter (Signed)
Copied from CRM [id number]. Topic: General - Other >> May 16, 2018  1:23 PM Leafy Ro wrote: Reason for CRM:[facility] left message on refill line. The pt needs hydralazine reference number [id number]

## 2018-05-18 DIAGNOSIS — H9041 Sensorineural hearing loss, unilateral, right ear, with unrestricted hearing on the contralateral side: Secondary | ICD-10-CM | POA: Diagnosis not present

## 2018-05-18 DIAGNOSIS — H8101 Meniere's disease, right ear: Secondary | ICD-10-CM | POA: Diagnosis not present

## 2018-05-23 ENCOUNTER — Other Ambulatory Visit: Payer: Self-pay | Admitting: Internal Medicine

## 2018-05-24 NOTE — Progress Notes (Signed)
Carelink Summary Report / Loop Recorder 

## 2018-06-15 ENCOUNTER — Other Ambulatory Visit: Payer: Self-pay

## 2018-06-15 ENCOUNTER — Ambulatory Visit (INDEPENDENT_AMBULATORY_CARE_PROVIDER_SITE_OTHER): Payer: Medicare Other | Admitting: *Deleted

## 2018-06-15 DIAGNOSIS — I480 Paroxysmal atrial fibrillation: Secondary | ICD-10-CM

## 2018-06-15 LAB — CUP PACEART REMOTE DEVICE CHECK
Date Time Interrogation Session: 20200423141454
Implantable Pulse Generator Implant Date: 20191008

## 2018-06-22 ENCOUNTER — Other Ambulatory Visit: Payer: Self-pay | Admitting: Family Medicine

## 2018-06-23 NOTE — Progress Notes (Signed)
Carelink Summary Report / Loop Recorder 

## 2018-07-16 ENCOUNTER — Encounter: Payer: Self-pay | Admitting: Physician Assistant

## 2018-07-16 ENCOUNTER — Other Ambulatory Visit: Payer: Self-pay | Admitting: Internal Medicine

## 2018-07-16 ENCOUNTER — Other Ambulatory Visit: Payer: Self-pay | Admitting: Physician Assistant

## 2018-07-18 ENCOUNTER — Ambulatory Visit (INDEPENDENT_AMBULATORY_CARE_PROVIDER_SITE_OTHER): Payer: Medicare Other | Admitting: *Deleted

## 2018-07-18 DIAGNOSIS — I480 Paroxysmal atrial fibrillation: Secondary | ICD-10-CM | POA: Diagnosis not present

## 2018-07-18 MED ORDER — DULOXETINE HCL 30 MG PO CPEP: 30.0000 mg | ORAL_CAPSULE | Freq: Every day | ORAL | 1 refills | Status: DC

## 2018-07-19 LAB — CUP PACEART REMOTE DEVICE CHECK
Date Time Interrogation Session: 20200526153934
Implantable Pulse Generator Implant Date: 20191008

## 2018-07-27 NOTE — Progress Notes (Signed)
Carelink Summary Report / Loop Recorder 

## 2018-07-31 ENCOUNTER — Telehealth: Payer: Self-pay | Admitting: Internal Medicine

## 2018-07-31 NOTE — Telephone Encounter (Signed)
[facility] 07/26/18 Dr. Carlean Purl,   Pt was referred for a colonoscopy.  Previous colonoscopy by Dr. Gaynelle Arabian at [facility] 2.12.2015.  Records will be placed on your desk for review.

## 2018-08-01 ENCOUNTER — Encounter: Payer: Self-pay | Admitting: Internal Medicine

## 2018-08-01 NOTE — Telephone Encounter (Signed)
Records reviewed Appropriate for colonoscopy due to history of adenomatous colon polyps Please schedule previsit and colonoscopy

## 2018-08-02 ENCOUNTER — Encounter: Payer: Self-pay | Admitting: Internal Medicine

## 2018-08-03 DIAGNOSIS — M5136 Other intervertebral disc degeneration, lumbar region: Secondary | ICD-10-CM | POA: Diagnosis not present

## 2018-08-03 DIAGNOSIS — M9904 Segmental and somatic dysfunction of sacral region: Secondary | ICD-10-CM | POA: Diagnosis not present

## 2018-08-03 DIAGNOSIS — Q72892 Other reduction defects of left lower limb: Secondary | ICD-10-CM | POA: Diagnosis not present

## 2018-08-03 DIAGNOSIS — M9905 Segmental and somatic dysfunction of pelvic region: Secondary | ICD-10-CM | POA: Diagnosis not present

## 2018-08-03 DIAGNOSIS — M5432 Sciatica, left side: Secondary | ICD-10-CM | POA: Diagnosis not present

## 2018-08-03 DIAGNOSIS — M5137 Other intervertebral disc degeneration, lumbosacral region: Secondary | ICD-10-CM | POA: Diagnosis not present

## 2018-08-03 DIAGNOSIS — M9903 Segmental and somatic dysfunction of lumbar region: Secondary | ICD-10-CM | POA: Diagnosis not present

## 2018-08-07 DIAGNOSIS — M9903 Segmental and somatic dysfunction of lumbar region: Secondary | ICD-10-CM | POA: Diagnosis not present

## 2018-08-07 DIAGNOSIS — M9904 Segmental and somatic dysfunction of sacral region: Secondary | ICD-10-CM | POA: Diagnosis not present

## 2018-08-07 DIAGNOSIS — M9905 Segmental and somatic dysfunction of pelvic region: Secondary | ICD-10-CM | POA: Diagnosis not present

## 2018-08-07 DIAGNOSIS — Q72892 Other reduction defects of left lower limb: Secondary | ICD-10-CM | POA: Diagnosis not present

## 2018-08-07 DIAGNOSIS — M5137 Other intervertebral disc degeneration, lumbosacral region: Secondary | ICD-10-CM | POA: Diagnosis not present

## 2018-08-07 DIAGNOSIS — M5432 Sciatica, left side: Secondary | ICD-10-CM | POA: Diagnosis not present

## 2018-08-07 DIAGNOSIS — M5136 Other intervertebral disc degeneration, lumbar region: Secondary | ICD-10-CM | POA: Diagnosis not present

## 2018-08-08 DIAGNOSIS — M5137 Other intervertebral disc degeneration, lumbosacral region: Secondary | ICD-10-CM | POA: Diagnosis not present

## 2018-08-08 DIAGNOSIS — M5432 Sciatica, left side: Secondary | ICD-10-CM | POA: Diagnosis not present

## 2018-08-08 DIAGNOSIS — M9904 Segmental and somatic dysfunction of sacral region: Secondary | ICD-10-CM | POA: Diagnosis not present

## 2018-08-08 DIAGNOSIS — Q72892 Other reduction defects of left lower limb: Secondary | ICD-10-CM | POA: Diagnosis not present

## 2018-08-08 DIAGNOSIS — M5136 Other intervertebral disc degeneration, lumbar region: Secondary | ICD-10-CM | POA: Diagnosis not present

## 2018-08-08 DIAGNOSIS — M9905 Segmental and somatic dysfunction of pelvic region: Secondary | ICD-10-CM | POA: Diagnosis not present

## 2018-08-08 DIAGNOSIS — M9903 Segmental and somatic dysfunction of lumbar region: Secondary | ICD-10-CM | POA: Diagnosis not present

## 2018-08-09 DIAGNOSIS — M9905 Segmental and somatic dysfunction of pelvic region: Secondary | ICD-10-CM | POA: Diagnosis not present

## 2018-08-09 DIAGNOSIS — M9904 Segmental and somatic dysfunction of sacral region: Secondary | ICD-10-CM | POA: Diagnosis not present

## 2018-08-09 DIAGNOSIS — M5136 Other intervertebral disc degeneration, lumbar region: Secondary | ICD-10-CM | POA: Diagnosis not present

## 2018-08-09 DIAGNOSIS — M9903 Segmental and somatic dysfunction of lumbar region: Secondary | ICD-10-CM | POA: Diagnosis not present

## 2018-08-09 DIAGNOSIS — M5432 Sciatica, left side: Secondary | ICD-10-CM | POA: Diagnosis not present

## 2018-08-09 DIAGNOSIS — Q72892 Other reduction defects of left lower limb: Secondary | ICD-10-CM | POA: Diagnosis not present

## 2018-08-09 DIAGNOSIS — M5137 Other intervertebral disc degeneration, lumbosacral region: Secondary | ICD-10-CM | POA: Diagnosis not present

## 2018-08-14 DIAGNOSIS — M9905 Segmental and somatic dysfunction of pelvic region: Secondary | ICD-10-CM | POA: Diagnosis not present

## 2018-08-14 DIAGNOSIS — M9904 Segmental and somatic dysfunction of sacral region: Secondary | ICD-10-CM | POA: Diagnosis not present

## 2018-08-14 DIAGNOSIS — M5137 Other intervertebral disc degeneration, lumbosacral region: Secondary | ICD-10-CM | POA: Diagnosis not present

## 2018-08-14 DIAGNOSIS — Q72892 Other reduction defects of left lower limb: Secondary | ICD-10-CM | POA: Diagnosis not present

## 2018-08-14 DIAGNOSIS — M5432 Sciatica, left side: Secondary | ICD-10-CM | POA: Diagnosis not present

## 2018-08-14 DIAGNOSIS — M9903 Segmental and somatic dysfunction of lumbar region: Secondary | ICD-10-CM | POA: Diagnosis not present

## 2018-08-14 DIAGNOSIS — M5136 Other intervertebral disc degeneration, lumbar region: Secondary | ICD-10-CM | POA: Diagnosis not present

## 2018-08-15 DIAGNOSIS — M5136 Other intervertebral disc degeneration, lumbar region: Secondary | ICD-10-CM | POA: Diagnosis not present

## 2018-08-15 DIAGNOSIS — Q72892 Other reduction defects of left lower limb: Secondary | ICD-10-CM | POA: Diagnosis not present

## 2018-08-15 DIAGNOSIS — M5432 Sciatica, left side: Secondary | ICD-10-CM | POA: Diagnosis not present

## 2018-08-15 DIAGNOSIS — M9904 Segmental and somatic dysfunction of sacral region: Secondary | ICD-10-CM | POA: Diagnosis not present

## 2018-08-15 DIAGNOSIS — M9903 Segmental and somatic dysfunction of lumbar region: Secondary | ICD-10-CM | POA: Diagnosis not present

## 2018-08-15 DIAGNOSIS — M5137 Other intervertebral disc degeneration, lumbosacral region: Secondary | ICD-10-CM | POA: Diagnosis not present

## 2018-08-15 DIAGNOSIS — M9905 Segmental and somatic dysfunction of pelvic region: Secondary | ICD-10-CM | POA: Diagnosis not present

## 2018-08-17 ENCOUNTER — Telehealth: Payer: Self-pay

## 2018-08-17 DIAGNOSIS — M9904 Segmental and somatic dysfunction of sacral region: Secondary | ICD-10-CM | POA: Diagnosis not present

## 2018-08-17 DIAGNOSIS — M9903 Segmental and somatic dysfunction of lumbar region: Secondary | ICD-10-CM | POA: Diagnosis not present

## 2018-08-17 DIAGNOSIS — M9905 Segmental and somatic dysfunction of pelvic region: Secondary | ICD-10-CM | POA: Diagnosis not present

## 2018-08-17 DIAGNOSIS — M5137 Other intervertebral disc degeneration, lumbosacral region: Secondary | ICD-10-CM | POA: Diagnosis not present

## 2018-08-17 DIAGNOSIS — M5432 Sciatica, left side: Secondary | ICD-10-CM | POA: Diagnosis not present

## 2018-08-17 DIAGNOSIS — Q72892 Other reduction defects of left lower limb: Secondary | ICD-10-CM | POA: Diagnosis not present

## 2018-08-17 DIAGNOSIS — M5136 Other intervertebral disc degeneration, lumbar region: Secondary | ICD-10-CM | POA: Diagnosis not present

## 2018-08-17 NOTE — Telephone Encounter (Signed)
Left message regarding appt on 08/18/18. 

## 2018-08-18 ENCOUNTER — Telehealth (INDEPENDENT_AMBULATORY_CARE_PROVIDER_SITE_OTHER): Payer: Medicare Other | Admitting: Internal Medicine

## 2018-08-18 ENCOUNTER — Encounter: Payer: Self-pay | Admitting: Internal Medicine

## 2018-08-18 VITALS — BP 130/71 | HR 71 | Wt 205.0 lb

## 2018-08-18 DIAGNOSIS — I480 Paroxysmal atrial fibrillation: Secondary | ICD-10-CM

## 2018-08-18 DIAGNOSIS — I10 Essential (primary) hypertension: Secondary | ICD-10-CM

## 2018-08-18 NOTE — Progress Notes (Signed)
Electrophysiology TeleHealth Note   Due to national recommendations of social distancing due to COVID 19, an audio/video telehealth visit is felt to be most appropriate for this patient at this time.  See MyChart message from today for the patient's consent to telehealth for [name].    Date:  08/18/2018   ID:  Ashley Woodard, DOB 11-17-1950, MRN [id number]  Location: patient's home  Provider location:  [facility]  Evaluation Performed: Follow-up visit  PCP:  Brunetta Jeans, PA-C   Electrophysiologist:  Dr Rayann Heman  Chief Complaint:  palpitations  History of Present Illness:    Ashley Woodard is a 68 y.o. female who presents via telehealth conferencing today.  Since last being seen in our clinic, the patient reports doing very well.  Today, she denies symptoms of palpitations, chest pain, shortness of breath,  lower extremity edema, dizziness, presyncope, or syncope.  The patient is otherwise without complaint today.  The patient denies symptoms of fevers, chills, cough, or new SOB worrisome for COVID 19.  Past Medical History:  Diagnosis Date  . Abnormal cardiovascular stress test 08/21/2016   a. done for abnl EKG/cardiac risk factors -> admitted for cath 07/2016 which showed no significant CAD, normal LV contraction, mildly elevated filling pressure. Low dose Imdur was added for possible component of microvascular dysfunction.   Marland Kitchen Anxiety   . Arthritis    "back, knees, fingers" (05/10/2017)  . Chronic lower back pain   . Colon polyps   . Deafness in right ear   . Depression   . Diverticulosis   . Hx of adenomatous colonic polyps 05/08/2003  . Hypertension   . Hypertriglyceridemia   . Hypothyroidism   . Migraine    "none in the 2000s" (05/10/2017)  . Paroxysmal atrial fibrillation (HCC)   . Pneumonia    "several times" (05/10/2017)  . PONV (postoperative nausea and vomiting)   . S/P cardiac catheterization, 08/20/16 minimal CAD 08/21/2016  . Sleep apnea    mild  per patient; "recently had another test and they said I don't have it" (05/10/2017)  . Type II diabetes mellitus ([place])   . Voice tremor     Past Surgical History:  Procedure Laterality Date  . ABDOMINAL HYSTERECTOMY  1988  . ATRIAL FIBRILLATION ABLATION N/A 05/10/2017   Procedure: ATRIAL FIBRILLATION ABLATION;  Surgeon: Thompson Grayer, MD;  Location: [place] [facility];  Service: Cardiovascular;  Laterality: N/A;  . COCHLEAR IMPLANT Right 2008  . LEFT HEART CATH AND CORONARY ANGIOGRAPHY N/A 08/20/2016   Procedure: Left Heart Cath and Coronary Angiography;  Surgeon: Nelva Bush, MD;  Location: [place] [facility];  Service: Cardiovascular;  Laterality: N/A;  . LOOP RECORDER INSERTION N/A 11/29/2017   Procedure: LOOP RECORDER INSERTION;  Surgeon: Thompson Grayer, MD;  Location: [place] [facility];  Service: Cardiovascular;  Laterality: N/A;    Current Outpatient Medications  Medication Sig Dispense Refill  . amLODipine (NORVASC) 10 MG tablet TAKE 1 TABLET DAILY 90 tablet 1  . Cholecalciferol (VITAMIN D) 2000 units CAPS Take 1 capsule by mouth daily.     Marland Kitchen doxylamine, Sleep, (SLEEP AID) 25 MG tablet Take 25 mg by mouth at bedtime.    . DULoxetine (CYMBALTA) 30 MG capsule Take 1 capsule (30 mg total) by mouth daily. 90 capsule 1  . glucose blood (CONTOUR NEXT TEST) test strip Check blood sugars once daily 100 each 6  . hydrALAZINE (APRESOLINE) 50 MG tablet TAKE 1 TABLET BY MOUTH TWICE  A DAY 60 tablet 0  . hydrochlorothiazide (HYDRODIURIL) 25 MG tablet TAKE 1 TABLET DAILY 90 tablet 3  . irbesartan (AVAPRO) 150 MG tablet TAKE 1 TABLET DAILY 90 tablet 3  . KLOR-CON M20 20 MEQ tablet TAKE 1 TABLET TWICE A DAY 180 tablet 3  . LANCETS ULTRA THIN 30G MISC 1 Stick by Does not apply route daily. 100 each 6  . levothyroxine (SYNTHROID) 75 MCG tablet TAKE 1 TABLET DAILY 90 tablet 1  . Magnesium 200 MG TABS Take 200 mg by mouth daily.     . metFORMIN (GLUCOPHAGE) 500 MG tablet TAKE 1 TABLET TWICE DAILY   WITH MEALS 180 tablet 1  . Omega-3 Fatty Acids (FISH OIL) 1000 MG CAPS Take 1,000 mg by mouth daily.     . simvastatin (ZOCOR) 20 MG tablet TAKE 1 TABLET MONDAY,      WEDNESDAY, FRIDAY 39 tablet 1  . vitamin B-12 (CYANOCOBALAMIN) 1000 MCG tablet Take 1 tablet (1,000 mcg total) by mouth daily. 30 tablet 0   No current facility-administered medications for this visit.     Allergies:   Demeclocycline, Doxycycline, Erythromycin, Lisinopril, and Tetracyclines & related   Social History:  The patient  reports that she has never smoked. She has never used smokeless tobacco. She reports that she does not drink alcohol or use drugs.   Family History:  The patient's  family history includes Breast cancer in her maternal aunt; Breast cancer (age of onset: 3765) in her sister; Cancer in her brother and maternal uncle; Dementia in her brother; Diabetes in her brother and mother; Heart attack in her brother, father, and mother; Hypertension in her brother, father, mother, and son; Stroke in her brother and father.   ROS:  Please see the history of present illness.   All other systems are personally reviewed and negative.    Exam:    Vital Signs:  BP 130/71   Pulse 71   Wt 205 lb (93 kg)   BMI 32.11 kg/m   Well appearing today, NAD, OP clear, normal WOB   Labs/Other Tests and Data Reviewed:    Recent Labs: 01/27/2018: ALT 19; Magnesium 2.2 04/28/2018: BUN 19; Creatinine, Ser 0.89; Potassium 4.1; Sodium 141   Wt Readings from Last 3 Encounters:  08/18/18 205 lb (93 kg)  04/28/18 203 lb (92.1 kg)  04/13/18 206 lb (93.4 kg)     Last device remote is reviewed from [employer] PDF which reveals normal device function, no arrhythmias    ASSESSMENT & PLAN:    1.  Paroxysmal atrial fibrillation Doing well s/p ablation off AAD therapy No afib by ILR.  Her coumadin has previously been discontinued. Her chads2vasc score is 4.  If her AF returns, we will consider restarting anticoagulation at that time.   2. HTN Stable No change required today  Follow-up:  6 months with me   Patient Risk:  after full review of this patients clinical status, I feel that they are at moderate risk at this time.  Today, I have spent 15 minutes with the patient with telehealth technology discussing arrhythmia management .    Randolm IdolSigned, Ziyana Morikawa, MD  08/18/2018 2:04 PM     [facility] [address] [place] [address] [phone] (office) [phone] (fax)

## 2018-08-21 ENCOUNTER — Ambulatory Visit (INDEPENDENT_AMBULATORY_CARE_PROVIDER_SITE_OTHER): Payer: Medicare Other | Admitting: *Deleted

## 2018-08-21 DIAGNOSIS — I480 Paroxysmal atrial fibrillation: Secondary | ICD-10-CM | POA: Diagnosis not present

## 2018-08-21 DIAGNOSIS — Q72892 Other reduction defects of left lower limb: Secondary | ICD-10-CM | POA: Diagnosis not present

## 2018-08-21 DIAGNOSIS — M9904 Segmental and somatic dysfunction of sacral region: Secondary | ICD-10-CM | POA: Diagnosis not present

## 2018-08-21 DIAGNOSIS — M9905 Segmental and somatic dysfunction of pelvic region: Secondary | ICD-10-CM | POA: Diagnosis not present

## 2018-08-21 DIAGNOSIS — M9903 Segmental and somatic dysfunction of lumbar region: Secondary | ICD-10-CM | POA: Diagnosis not present

## 2018-08-21 DIAGNOSIS — M5432 Sciatica, left side: Secondary | ICD-10-CM | POA: Diagnosis not present

## 2018-08-21 DIAGNOSIS — M5137 Other intervertebral disc degeneration, lumbosacral region: Secondary | ICD-10-CM | POA: Diagnosis not present

## 2018-08-21 DIAGNOSIS — M5136 Other intervertebral disc degeneration, lumbar region: Secondary | ICD-10-CM | POA: Diagnosis not present

## 2018-08-21 LAB — CUP PACEART REMOTE DEVICE CHECK
Date Time Interrogation Session: 20200628161144
Implantable Pulse Generator Implant Date: 20191008

## 2018-08-22 DIAGNOSIS — M5432 Sciatica, left side: Secondary | ICD-10-CM | POA: Diagnosis not present

## 2018-08-22 DIAGNOSIS — M9903 Segmental and somatic dysfunction of lumbar region: Secondary | ICD-10-CM | POA: Diagnosis not present

## 2018-08-22 DIAGNOSIS — M9905 Segmental and somatic dysfunction of pelvic region: Secondary | ICD-10-CM | POA: Diagnosis not present

## 2018-08-22 DIAGNOSIS — M9904 Segmental and somatic dysfunction of sacral region: Secondary | ICD-10-CM | POA: Diagnosis not present

## 2018-08-22 DIAGNOSIS — Q72892 Other reduction defects of left lower limb: Secondary | ICD-10-CM | POA: Diagnosis not present

## 2018-08-22 DIAGNOSIS — M5136 Other intervertebral disc degeneration, lumbar region: Secondary | ICD-10-CM | POA: Diagnosis not present

## 2018-08-22 DIAGNOSIS — M5137 Other intervertebral disc degeneration, lumbosacral region: Secondary | ICD-10-CM | POA: Diagnosis not present

## 2018-08-24 DIAGNOSIS — M5137 Other intervertebral disc degeneration, lumbosacral region: Secondary | ICD-10-CM | POA: Diagnosis not present

## 2018-08-24 DIAGNOSIS — M9903 Segmental and somatic dysfunction of lumbar region: Secondary | ICD-10-CM | POA: Diagnosis not present

## 2018-08-24 DIAGNOSIS — Q72892 Other reduction defects of left lower limb: Secondary | ICD-10-CM | POA: Diagnosis not present

## 2018-08-24 DIAGNOSIS — M9905 Segmental and somatic dysfunction of pelvic region: Secondary | ICD-10-CM | POA: Diagnosis not present

## 2018-08-24 DIAGNOSIS — M9904 Segmental and somatic dysfunction of sacral region: Secondary | ICD-10-CM | POA: Diagnosis not present

## 2018-08-24 DIAGNOSIS — M5136 Other intervertebral disc degeneration, lumbar region: Secondary | ICD-10-CM | POA: Diagnosis not present

## 2018-08-24 DIAGNOSIS — M5432 Sciatica, left side: Secondary | ICD-10-CM | POA: Diagnosis not present

## 2018-08-28 DIAGNOSIS — M9903 Segmental and somatic dysfunction of lumbar region: Secondary | ICD-10-CM | POA: Diagnosis not present

## 2018-08-28 DIAGNOSIS — M5432 Sciatica, left side: Secondary | ICD-10-CM | POA: Diagnosis not present

## 2018-08-28 DIAGNOSIS — Q72892 Other reduction defects of left lower limb: Secondary | ICD-10-CM | POA: Diagnosis not present

## 2018-08-28 DIAGNOSIS — M9904 Segmental and somatic dysfunction of sacral region: Secondary | ICD-10-CM | POA: Diagnosis not present

## 2018-08-28 DIAGNOSIS — M5137 Other intervertebral disc degeneration, lumbosacral region: Secondary | ICD-10-CM | POA: Diagnosis not present

## 2018-08-28 DIAGNOSIS — M9905 Segmental and somatic dysfunction of pelvic region: Secondary | ICD-10-CM | POA: Diagnosis not present

## 2018-08-28 DIAGNOSIS — M5136 Other intervertebral disc degeneration, lumbar region: Secondary | ICD-10-CM | POA: Diagnosis not present

## 2018-08-29 DIAGNOSIS — M5136 Other intervertebral disc degeneration, lumbar region: Secondary | ICD-10-CM | POA: Diagnosis not present

## 2018-08-29 DIAGNOSIS — M5432 Sciatica, left side: Secondary | ICD-10-CM | POA: Diagnosis not present

## 2018-08-29 DIAGNOSIS — M9904 Segmental and somatic dysfunction of sacral region: Secondary | ICD-10-CM | POA: Diagnosis not present

## 2018-08-29 DIAGNOSIS — M9903 Segmental and somatic dysfunction of lumbar region: Secondary | ICD-10-CM | POA: Diagnosis not present

## 2018-08-29 DIAGNOSIS — Q72892 Other reduction defects of left lower limb: Secondary | ICD-10-CM | POA: Diagnosis not present

## 2018-08-29 DIAGNOSIS — M9905 Segmental and somatic dysfunction of pelvic region: Secondary | ICD-10-CM | POA: Diagnosis not present

## 2018-08-29 DIAGNOSIS — M5137 Other intervertebral disc degeneration, lumbosacral region: Secondary | ICD-10-CM | POA: Diagnosis not present

## 2018-08-30 ENCOUNTER — Ambulatory Visit (AMBULATORY_SURGERY_CENTER): Payer: Medicare Other | Admitting: *Deleted

## 2018-08-30 ENCOUNTER — Other Ambulatory Visit: Payer: Self-pay

## 2018-08-30 VITALS — Ht 67.0 in | Wt 205.0 lb

## 2018-08-30 DIAGNOSIS — Z86010 Personal history of colonic polyps: Secondary | ICD-10-CM

## 2018-08-30 NOTE — Progress Notes (Signed)
No egg or soy allergy known to patient   issues with past sedation with any surgeries  or procedures- PONV, no intubation problems  No diet pills per patient No home 02 use per patient  No blood thinners per patient  Pt denies issues with constipation  No A fib or A flutter  EMMI video sent to pt's e mail   Pt verified name, DOB, address and insurance during PV today. Pt mailed instruction packet to included paper to complete and mail back to [facility] with addressed and stamped envelope, Emmi video, copy of consent form to read and not return, and instructions.. PV completed over the phone. Pt encouraged to call with questions or issues   Pt is aware that care partner will wait in the car during proceudre; if they feel like they will be too hot to wait in the car; they may wait in the lobby.  We want them to wear a mask (we do not have any that we can provide them), practice social distancing, and we will check their temperatures when they get here.  I did remind patient that their care partner needs to stay in the parking lot the entire time. Pt will wear mask into building.

## 2018-08-30 NOTE — Progress Notes (Signed)
Carelink Summary Report / Loop Recorder 

## 2018-09-12 ENCOUNTER — Telehealth: Payer: Self-pay | Admitting: Internal Medicine

## 2018-09-13 ENCOUNTER — Encounter: Payer: Medicare Other | Admitting: Internal Medicine

## 2018-09-19 DIAGNOSIS — H25013 Cortical age-related cataract, bilateral: Secondary | ICD-10-CM | POA: Diagnosis not present

## 2018-09-19 DIAGNOSIS — E119 Type 2 diabetes mellitus without complications: Secondary | ICD-10-CM | POA: Diagnosis not present

## 2018-09-22 ENCOUNTER — Ambulatory Visit (INDEPENDENT_AMBULATORY_CARE_PROVIDER_SITE_OTHER): Payer: Medicare Other | Admitting: *Deleted

## 2018-09-22 DIAGNOSIS — I480 Paroxysmal atrial fibrillation: Secondary | ICD-10-CM

## 2018-09-22 LAB — CUP PACEART REMOTE DEVICE CHECK
Date Time Interrogation Session: 20200731144301
Implantable Pulse Generator Implant Date: 20191008

## 2018-09-28 NOTE — Progress Notes (Signed)
Carelink Summary Report / Loop Recorder 

## 2018-10-05 ENCOUNTER — Telehealth: Payer: Self-pay | Admitting: Internal Medicine

## 2018-10-05 NOTE — Telephone Encounter (Signed)

## 2018-10-06 ENCOUNTER — Ambulatory Visit (AMBULATORY_SURGERY_CENTER): Payer: Medicare Other | Admitting: Internal Medicine

## 2018-10-06 ENCOUNTER — Encounter: Payer: Self-pay | Admitting: Internal Medicine

## 2018-10-06 ENCOUNTER — Other Ambulatory Visit: Payer: Self-pay

## 2018-10-06 VITALS — BP 113/63 | HR 66 | Temp 98.6°F | Resp 13 | Ht 67.0 in | Wt 205.0 lb

## 2018-10-06 DIAGNOSIS — Z1211 Encounter for screening for malignant neoplasm of colon: Secondary | ICD-10-CM | POA: Diagnosis not present

## 2018-10-06 DIAGNOSIS — Z86010 Personal history of colonic polyps: Secondary | ICD-10-CM | POA: Diagnosis not present

## 2018-10-06 MED ORDER — SODIUM CHLORIDE 0.9 % IV SOLN: 500.0000 mL | Freq: Once | INTRAVENOUS | Status: DC

## 2018-10-06 NOTE — Progress Notes (Signed)
SP- Temp CW- Vitals 

## 2018-10-06 NOTE — Progress Notes (Signed)
Pt's states no medical or surgical changes since previsit or office visit. 

## 2018-10-06 NOTE — Patient Instructions (Addendum)
No polyps today.  You do have diverticulosis - thickened muscle rings and pouches in the colon wall. Please read the handout about this condition.  Your next routine colonoscopy should be in about 7 years - 2027   I appreciate the opportunity to care for you. Gatha Mayer, MD, [facility]  Diverticulosis handout given to patient.  Resume previous diet. Continue present medications.  Repeat colonoscopy in 7 years for surveillance.  Dr. Celesta Aver staff will be in touch regarding a pelvic floor physical therapy referral.  YOU HAD AN ENDOSCOPIC PROCEDURE TODAY AT [place]:   Refer to the procedure report that was given to you for any specific questions about what was found during the examination.  If the procedure report does not answer your questions, please call your gastroenterologist to clarify.  If you requested that your care partner not be given the details of your procedure findings, then the procedure report has been included in a sealed envelope for you to review at your convenience later.  YOU SHOULD EXPECT: Some feelings of bloating in the abdomen. Passage of more gas than usual.  Walking can help get rid of the air that was put into your GI tract during the procedure and reduce the bloating. If you had a lower endoscopy (such as a colonoscopy or flexible sigmoidoscopy) you may notice spotting of blood in your stool or on the toilet paper. If you underwent a bowel prep for your procedure, you may not have a normal bowel movement for a few days.  Please Note:  You might notice some irritation and congestion in your nose or some drainage.  This is from the oxygen used during your procedure.  There is no need for concern and it should clear up in a day or so.  SYMPTOMS TO REPORT IMMEDIATELY:   Following lower endoscopy (colonoscopy or flexible sigmoidoscopy):  Excessive amounts of blood in the stool  Significant tenderness or worsening of abdominal pains  Swelling of the abdomen that is new, acute  Fever of 100F or higher For urgent or emergent issues, a gastroenterologist can be reached at any hour by calling [phone].   DIET:  We do recommend a small meal at first, but then you may proceed to your regular diet.  Drink plenty of fluids but you should avoid alcoholic beverages for 24 hours.  ACTIVITY:  You should plan to take it easy for the rest of today and you should NOT DRIVE or use heavy machinery until tomorrow (because of the sedation medicines used during the test).    FOLLOW UP: Our staff will call the number listed on your records 48-72 hours following your procedure to check on you and address any questions or concerns that you may have regarding the information given to you following your procedure. If we do not reach you, we will leave a message.  We will attempt to reach you two times.  During this call, we will ask if you have developed any symptoms of COVID 19. If you develop any symptoms (ie: fever, flu-like symptoms, shortness of breath, cough etc.) before then, please call [phone].  If you test positive for Covid 19 in the 2 weeks post procedure, please call and report this information to [place].    If any biopsies were taken you will be contacted by phone or by letter within the next 1-3 weeks.  Please call us at [phone] if you have not heard about the biopsies in 3  weeks.    SIGNATURES/CONFIDENTIALITY: You and/or your care partner have signed paperwork which will be entered into your electronic medical record.  These signatures attest to the fact that that the information above on your After Visit Summary has been reviewed and is understood.  Full responsibility of the confidentiality of this discharge information lies with you and/or your care-partner.

## 2018-10-06 NOTE — Progress Notes (Signed)
A and O x3. Report to RN. Tolerated MAC anesthesia well.

## 2018-10-06 NOTE — Op Note (Addendum)
[place] Patient Name: Ashley Woodard Procedure Date: 10/06/2018 9:55 AM MRN: [id number] Endoscopist: Gatha Mayer , MD Age: 68 Referring MD:  Date of Birth: Jan 14, 1951 Gender: Female Account #: [id number] Procedure:                Colonoscopy Indications:              Surveillance: Personal history of adenomatous                            polyps on last colonoscopy 5 years ago Medicines:                Propofol per Anesthesia, Monitored Anesthesia Care Procedure:                Pre-Anesthesia Assessment:                           - Prior to the procedure, a History and Physical                            was performed, and patient medications and                            allergies were reviewed. The patient's tolerance of                            previous anesthesia was also reviewed. The risks                            and benefits of the procedure and the sedation                            options and risks were discussed with the patient.                            All questions were answered, and informed consent                            was obtained. Prior Anticoagulants: The patient has                            taken no previous anticoagulant or antiplatelet                            agents. ASA Grade Assessment: III - A patient with                            severe systemic disease. After reviewing the risks                            and benefits, the patient was deemed in                            satisfactory condition to undergo the procedure.  After obtaining informed consent, the colonoscope                            was passed under direct vision. Throughout the                            procedure, the patient's blood pressure, pulse, and                            oxygen saturations were monitored continuously. The                            Colonoscope was introduced through the anus and   advanced to the the cecum, identified by                            appendiceal orifice and ileocecal valve. The                            colonoscopy was somewhat difficult due to                            significant looping. Successful completion of the                            procedure was aided by applying abdominal pressure.                            The patient tolerated the procedure well. The                            quality of the bowel preparation was good. The                            bowel preparation used was Miralax via split dose                            instruction. The ileocecal valve, appendiceal                            orifice, and rectum were photographed. Scope In: 10:20:01 AM Scope Out: 10:35:42 AM Scope Withdrawal Time: 0 hours 9 minutes 43 seconds  Total Procedure Duration: 0 hours 15 minutes 41 seconds  Findings:                 The perianal and digital rectal examinations were                            normal.                           Multiple diverticula were found in the sigmoid                            colon.  The exam was otherwise without abnormality on                            direct and retroflexion views. Complications:            No immediate complications. Estimated Blood Loss:     Estimated blood loss: none. Impression:               - Diverticulosis in the sigmoid colon.                           - The examination was otherwise normal on direct                            and retroflexion views.                           - No specimens collected.                           - Personal history of colonic polyps. adenomas 1                            each 2005 and 2010 Recommendation:           - Patient has a contact number available for                            emergencies. The signs and symptoms of potential                            delayed complications were discussed with the                             patient. Return to normal activities tomorrow.                            Written discharge instructions were provided to the                            patient.                           - Resume previous diet.                           - Continue present medications.                           - Repeat colonoscopy in 7 years for surveillance.                           REFER TO PT FOR PELVIC FLOOR PT - FECAL AND URINARY                            INCONTINENCE Iva Booparl E Daniela Siebers, MD 10/06/2018 10:42:03 AM This report has been signed electronically.

## 2018-10-10 ENCOUNTER — Telehealth: Payer: Self-pay

## 2018-10-10 DIAGNOSIS — R32 Unspecified urinary incontinence: Secondary | ICD-10-CM

## 2018-10-10 DIAGNOSIS — R159 Full incontinence of feces: Secondary | ICD-10-CM

## 2018-10-10 NOTE — Telephone Encounter (Signed)
  Follow up Call-  Call back number 10/06/2018  Post procedure Call Back phone  # [phone]  Permission to leave phone message Yes  Some recent data might be hidden     Patient questions:  Do you have a fever, pain , or abdominal swelling? No. Pain Score  0 *  Have you tolerated food without any problems? Yes.    Have you been able to return to your normal activities? Yes.    Do you have any questions about your discharge instructions: Diet   No. Medications  No. Follow up visit  No.  Do you have questions or concerns about your Care? No.  Actions: * If pain score is 4 or above: No action needed, pain <4.  1. Have you developed a fever since your procedure? no  2.   Have you had an respiratory symptoms (SOB or cough) since your procedure? no  3.   Have you tested positive for COVID 19 since your procedure no  4.   Have you had any family members/close contacts diagnosed with the COVID 19 since your procedure?  no   If yes to any of these questions please route to Joylene John, RN and Alphonsa Gin, Therapist, sports.

## 2018-10-10 NOTE — Telephone Encounter (Signed)
-----   Message from Gatha Mayer, MD sent at 10/06/2018  2:42 PM EDT ----- Regarding: PT referral She needs referal for fecal and urinary incontinence  Lives near [place] so let's refer to this PT at [facility], [place], DPT, E-RYT

## 2018-10-10 NOTE — Telephone Encounter (Signed)
Follow up call to pt, left message that pt need not call us back unless she is having problems and we will call her later today.

## 2018-10-10 NOTE — Telephone Encounter (Signed)
Order placed Patient notified she will be contacted directly with an appt date and time

## 2018-10-25 ENCOUNTER — Ambulatory Visit (INDEPENDENT_AMBULATORY_CARE_PROVIDER_SITE_OTHER): Payer: Medicare Other | Admitting: *Deleted

## 2018-10-25 DIAGNOSIS — I480 Paroxysmal atrial fibrillation: Secondary | ICD-10-CM

## 2018-10-26 LAB — CUP PACEART REMOTE DEVICE CHECK
Date Time Interrogation Session: 20200902164207
Implantable Pulse Generator Implant Date: 20191008

## 2018-10-27 ENCOUNTER — Other Ambulatory Visit: Payer: Self-pay | Admitting: Family Medicine

## 2018-10-27 ENCOUNTER — Ambulatory Visit: Payer: Medicare Other

## 2018-11-03 ENCOUNTER — Ambulatory Visit: Payer: Medicare Other | Attending: Internal Medicine

## 2018-11-03 ENCOUNTER — Other Ambulatory Visit: Payer: Self-pay

## 2018-11-03 ENCOUNTER — Telehealth: Payer: Self-pay

## 2018-11-03 DIAGNOSIS — M6281 Muscle weakness (generalized): Secondary | ICD-10-CM | POA: Diagnosis not present

## 2018-11-03 DIAGNOSIS — R293 Abnormal posture: Secondary | ICD-10-CM | POA: Insufficient documentation

## 2018-11-03 DIAGNOSIS — M62838 Other muscle spasm: Secondary | ICD-10-CM | POA: Insufficient documentation

## 2018-11-03 NOTE — Therapy (Addendum)
[facility] [address] [place], [place], [address] Phone: [phone]   Fax:  [phone]  Physical Therapy Evaluation  The patient has been informed of current processes in place at Outpatient Rehab to protect patients from Covid-19 exposure including social distancing, schedule modifications, and new cleaning procedures. After discussing their particular risk with a therapist based on the patient's personal risk factors, the patient has decided to proceed with in-person therapy.   Patient Details  Name: Ashley Woodard MRN: [id number] Date of Birth: August 19, 1950 Referring Provider (PT): Stan HeadGessner, Carl   Encounter Date: 11/03/2018  PT End of Session - 11/06/18 0814    Visit Number  1    Number of Visits  10    Date for PT Re-Evaluation  01/12/19    Authorization Type  [employer]    Authorization Time Period  through 01/12/2019    Authorization - Visit Number  1    Authorization - Number of Visits  10    PT Start Time  0900    PT Stop Time  1000    PT Time Calculation (min)  60 min    Activity Tolerance  Patient tolerated treatment well;No increased pain    Behavior During Therapy  WFL for tasks assessed/performed       Past Medical History:  Diagnosis Date  . Abnormal cardiovascular stress test 08/21/2016   a. done for abnl EKG/cardiac risk factors -> admitted for cath 07/2016 which showed no significant CAD, normal LV contraction, mildly elevated filling pressure. Low dose Imdur was added for possible component of microvascular dysfunction.   . Allergy   . Anxiety   . Arthritis    "back, knees, fingers" (05/10/2017)  . Chronic lower back pain   . Colon polyps   . Deafness in right ear   . Depression   . Diverticulosis   . Hx of adenomatous colonic polyps 05/08/2003  . Hypertension   . Hypertriglyceridemia   . Hypothyroidism   . Migraine    "none in the 2000s" (05/10/2017)  . Osteoporosis   . Paroxysmal atrial  fibrillation (HCC)   . Pneumonia    "several times" (05/10/2017)  . PONV (postoperative nausea and vomiting)   . S/P cardiac catheterization, 08/20/16 minimal CAD 08/21/2016  . Type II diabetes mellitus (HCC)   . Voice tremor     Past Surgical History:  Procedure Laterality Date  . ABDOMINAL HYSTERECTOMY  1988  . ATRIAL FIBRILLATION ABLATION N/A 05/10/2017   Procedure: ATRIAL FIBRILLATION ABLATION;  Surgeon: Hillis RangeAllred, James, MD;  Location: [facility] INVASIVE [facility];  Service: Cardiovascular;  Laterality: N/A;  . COCHLEAR IMPLANT Right 2008  . COLONOSCOPY    . LEFT HEART CATH AND CORONARY ANGIOGRAPHY N/A 08/20/2016   Procedure: Left Heart Cath and Coronary Angiography;  Surgeon: Yvonne KendallEnd, Christopher, MD;  Location: [facility] INVASIVE CV LAB;  Service: Cardiovascular;  Laterality: N/A;  . LOOP RECORDER INSERTION N/A 11/29/2017   Procedure: LOOP RECORDER INSERTION;  Surgeon: Hillis RangeAllred, James, MD;  Location: [facility] INVASIVE CV LAB;  Service: Cardiovascular;  Laterality: N/A;    There were no vitals filed for this visit.       [facility] - 11/06/18 0001      Assessment   Medical Diagnosis  Urinary Incontinence, Fecal Incontinence    Referring Provider (PT)  Stan HeadGessner, Carl    Onset Date/Surgical Date  11/02/13    Prior Therapy  Chiropractic      Balance Screen   Has  the patient fallen in the past 6 months  Yes    How many times?  1   Fell down stairs 6 months ago   Has the patient had a decrease in activity level because of a fear of falling?   No    Is the patient reluctant to leave their home because of a fear of falling?   No      Home Public house managernvironment   Living Environment  Private residence    Living Arrangements  Spouse/significant other    Type of Home  House    Home Access  Stairs to enter    Entrance Stairs-Number of Steps  7    Entrance Stairs-Rails  Right;Left    Home Layout  Two level    Alternate Level Stairs-Number of Steps  14    Alternate Level Stairs-Rails  Left      Prior Function    Level of Independence  Independent    Vocation  Full time employment    Vocation Requirements  --   Nurse, mental healthresident of [employer]     Cognition   Overall Cognitive Status  Within Functional Limits for tasks assessed        Pelvic Floor Physical Therapy Evaluation and Assessment  SCREENING  Falls in last 6 mo: once, six months ago, has inner-ear issues  Red Flags:  Have you had any night sweats? no Unexplained weight loss? no Saddle anesthesia? no Unexplained changes in bowel or bladder habits? no  SUBJECTIVE  Patient reports: Started having UI/FI starting ~ 5 years ago, has been having tailbone pain for ~ 6 months, went to the chiropractor and it did not help.  Has been told she has a leg-length difference.  Precautions:  Osteoporosis, Deaf in R ear, Depression.  Social/Family/Vocational History:   Full time, President of [employer]  Recent Procedures/Tests/Findings:  none  Obstetrical History: 2, vaginal tearing with both severe with one  Gynecological History: Hysterectomy due to endometriosis ~ 40 years ago.  Urinary History: SUI with cough/sneeze or with standing, some throughout the night has leakage without sensory awareness.  Drinking less than 32 oz of fluids throughout the day, 2 cups of coffee in the morning, 2 cups of water in the evening.  Wearing a panty-liner throughout the day and night.   Gastrointestinal History: Having daily BM's with occasional straining and other times cannot get to the restroom in time with fairly loose stool. Fluctuates between going regularly and not.   1/7 days over past week  Sexual activity/pain: none  Location of pain: tailbone and B in lower abdomen/pelvis Current pain:  3/10  Max pain:  5/10 Least pain:  2/10 Nature of pain: Sharp  Patient Goals: Control bowels and flatulence better. Get rid of pain.   OBJECTIVE  Posture/Observations:  Sitting: crossed legs Standing: knees locked, RLE ER'd,  hyperlordotic. R PSIS high, R knee bent or hyperextended Supine: R ASIS high  Palpation/Segmental Motion/Joint Play: TTP to R>L OI, L>R Glute med, QL. Decreased mobility at B sacral borders, greater stiffness near apex, pain greater near base. Decreased mobility and pain through lumbar and upper half of thoracic spine.   Special tests:   Stork: Positive for instability on R Leg-length: L: 88.5, R: 86.5 Supine-to-long sit: L long in both  Range of Motion/Flexibilty:  Spine: slightly less L SB motion compared to R but sensation of "straning" with  R SB Rotation: ~ 10 deg. Less R rotation and "pulling" through R scapular region Hips:  Strength/MMT:  LE MMT  LE MMT Left Right  Hip flex:  (L2) /5 /5  Hip ext: 4+/5 5/5  Hip abd: 4+/5 5/5  Hip add: 4+/5 5/5  Hip IR 4+/5 4+/5  Hip ER 4+/5 5/5     Abdominal:  Palpation: TTP to B Iliacus Diastasis: 3 fingers at rest, narrows to 1 finger with exertion.   Pelvic Floor External Exam: Deferred to follow-up Introitus Appears:  Skin integrity:  Palpation: Cough: Prolapse visible?: Scar mobility:  Internal Vaginal Exam: Strength (PERF):  Symmetry: Palpation: Prolapse:   Internal Rectal Exam: Strength (PERF): Symmetry: Palpation: Prolapse:   Gait Analysis: deferred to follow-up   Pelvic Floor Outcome Measures: PFDI: 187/300, PFIQ:119/300, PDI: 33/60 (55%)  INTERVENTIONS THIS SESSION: Self-care: Educated on the structure and function of the pelvic floor in relation to their symptoms as well as the POC, and initial HEP in order to set patient expectations and understanding from which we will build on in the future sessions. Educated on posture and how it effects her Sx. As well as how to improve upon it.   Total time: 60 min.            Objective measurements completed on examination: See above findings.                PT Short Term Goals - 11/06/18 0830      PT SHORT TERM GOAL #1   Title   Patient will demonstrate functional recruitment of TA with breathing, sit-to-stand, squatting/lifting, and walking to allow for improved pelvic brace coordination, improved balance, and decreased downward pressure on the pelvic organs.    Baseline  Pt. describes rectal prolapse on questionnaire    Time  5    Period  Weeks    Status  New    Target Date  12/08/18      PT SHORT TERM GOAL #2   Title  Patient will demonstrate improved pelvic alignment and balance of musculature surrounding the pelvis to facilitate decreased PFM spasms and decrease sacral pain.    Baseline  R up-slip and LLE long    Time  5    Period  Weeks    Status  New    Target Date  12/08/18      PT SHORT TERM GOAL #3   Title  Patient will report a reduction in pain to no greater than 3/10 over the prior week to demonstrate symptom improvement.    Baseline  5/10 max pain    Time  5    Period  Weeks    Status  New    Target Date  12/08/18        PT Long Term Goals - 11/06/18 0835      PT LONG TERM GOAL #1   Title  Patient will report no episodes of SUI/FI over the course of the prior two weeks to demonstrate improved functional ability.    Baseline  Having SUI with cough/sneeze/bend and FI with loose stool as well as gas incontinence.    Time  10    Period  Weeks    Status  New    Target Date  01/12/19      PT LONG TERM GOAL #2   Title  Patient will score at or below 97/300 on the PFDI and 29/300 on the PFIQ to demonstrate a clinically meaningful decrease in disability and distress due to pelvic floor dysfunction.    Baseline  PFDI: 187/300, PFIQ:119/300,    Time  10    Period  Weeks    Status  New    Target Date  01/12/19      PT LONG TERM GOAL #3   Title  Patient will score at or below 25% on the PGQ to demonstrate a clinically significant decrease in disability and improved functional ability.    Baseline  PDI: 33/60 (55%)    Time  10    Period  Weeks    Status  New    Target Date  01/12/19              Plan - 11/06/18 0815    Clinical Impression Statement  Pt. is a 68 y/o female who presents today with cheif c/o urinary and fecal incontinence as well as sacral pain. Her relevant PMH includes 2 vaginal delveries, one with severe tearing, a hysterectomy, osteoporosis, migraines, and depression. Her clinical exam revealed a leg-length discrepancy of 2 cm. with the LLE long as well as a R up-slip, spasms surrounding the pelvis on the L>R decreased sacral and spinal mobility as well as decreased stability and balance on the RLE. She will benefit from skilled pelvic PT to address the noted defecits and to assess for and address other potential causes of her Sx. .    Personal Factors and Comorbidities  Comorbidity 3+    Comorbidities  Osteoporosis, diabetes, Depression    Examination-Activity Limitations  Continence;Toileting;Sit    Examination-Participation Restrictions  Interpersonal Relationship;Community Activity;Shop;Driving    Stability/Clinical Decision Making  Unstable/Unpredictable    Clinical Decision Making  High    Rehab Potential  Good    PT Frequency  1x / week    PT Duration  Other (comment)   10 weeks   PT Treatment/Interventions  ADLs/Self Care Home Management;Biofeedback;Electrical Stimulation;Therapeutic activities;Functional mobility training;Therapeutic exercise;Balance training;Neuromuscular re-education;Orthotic Fit/Training;Manual techniques;Scar mobilization;Taping;Dry needling;Passive range of motion;Spinal Manipulations;Joint Manipulations    PT Next Visit Plan  R up-slip correction, sacral mobility/spasm reduction and give heel-lift.    PT Home Exercise Plan  Standing posture    Consulted and Agree with Plan of Care  Patient       Patient will benefit from skilled therapeutic intervention in order to improve the following deficits and impairments:  Decreased balance, Increased muscle spasms, Improper body mechanics, Decreased scar mobility, Decreased  activity tolerance, Decreased coordination, Decreased strength, Postural dysfunction, Pain  Visit Diagnosis: Muscle weakness (generalized)  Abnormal posture  Other muscle spasm     Problem List Patient Active Problem List   Diagnosis Date Noted  . Paroxysmal atrial fibrillation ([place]) 05/10/2017  . Hypothyroidism 02/24/2017  . 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 S/P cardiac catheterization, 08/20/16 minimal CAD  08/21/2016  . Unstable angina ([place])   . DDD (degenerative disc disease), lumbar 06/29/2016  . Meniere's disease 06/29/2016  . Diabetes mellitus, type 2 ([place]) 09/30/2014  . Hyperlipemia 09/30/2014  . Hypertension 09/30/2014  . Hx of adenomatous colonic polyps 05/08/2003   Ashley Woodard DPT, ATC Ashley Woodard 11/06/2018, 8:44 AM  [place] [facility]IN [facility] [address] [place], [place], [address] Phone: [phone]   Fax:  [phone]  Name: Ashley Woodard MRN: [id number] Date of Birth: 09-Jan-1951

## 2018-11-03 NOTE — Patient Instructions (Signed)
  Your "usual posture" looks like the sway back picture above, we want it to look more like the first picture labeled "normal" Where the earlobe, tip of the shoulder, hip, and knee are all in a straight line.  Make sure that your knees are un-locked but not bent.  Draw up as if an imaginary string were tied to the crown of your head stretching you up tall. Gently pull shoulders back and down without "pushing" chest out. Pull chin in slightly as if you were a turtle pulling into your shell until ears line up with tips of shoulders.  

## 2018-11-03 NOTE — Telephone Encounter (Signed)
Left message for patient to remind of missed remote transmission.  

## 2018-11-06 NOTE — Addendum Note (Signed)
Addended by: Letitia Libra T on: 11/06/2018 08:48 AM   Modules accepted: Orders

## 2018-11-09 NOTE — Progress Notes (Signed)
Carelink Summary Report / Loop Recorder 

## 2018-11-10 ENCOUNTER — Other Ambulatory Visit: Payer: Self-pay

## 2018-11-10 ENCOUNTER — Ambulatory Visit: Payer: Medicare Other

## 2018-11-10 DIAGNOSIS — R293 Abnormal posture: Secondary | ICD-10-CM | POA: Diagnosis not present

## 2018-11-10 DIAGNOSIS — M6281 Muscle weakness (generalized): Secondary | ICD-10-CM

## 2018-11-10 DIAGNOSIS — M62838 Other muscle spasm: Secondary | ICD-10-CM | POA: Diagnosis not present

## 2018-11-10 NOTE — Patient Instructions (Addendum)
As you start wearing your heel-lift only wear it for an hour the first day and increase by an hour each day so you can allow for the body to adapt to the change easily without much pain. If your pain increases by more than 1-2 points, back off slightly or slow down how quickly you increase your wear time. Once you reach a full day of wear, use it as much as possible forever, even in house-shoes or flip-flops if necessary to keep yourself from reverting to bad pelvic and spinal alignment and having symptoms return.    Adjust-a-lift heel lift Can be found at [email or web address]  Flexors, Lunge  Hip Flexor Stretch: Proposal Pose    Maintain pelvic tuck under, lift pubic bone toward navel. Engage posterior hip muscles (firm glute muscles of leg in back position) and shift forward until you feel stretch on front of leg that is down. To increase stretch, maintain balance and ease hips forward. You may use one hand on a chair for balance if needed. Hold for __5__ breaths. Repeat __2-3__ times each leg.  Do _1-2__ times per day.    Hold for 30 seconds (5 deep breaths) and repeat 2-3 times on each side once a day   

## 2018-11-10 NOTE — Therapy (Signed)
[facility] [address] [place], [place], [address] Phone: [phone]   Fax:  [phone]  Physical Therapy Treatment  The patient has been informed of current processes in place at Outpatient Rehab to protect patients from Covid-19 exposure including social distancing, schedule modifications, and new cleaning procedures. After discussing their particular risk with a therapist based on the patient's personal risk factors, the patient has decided to proceed with in-person therapy.   Patient Details  Name: Ashley Woodard MRN: [id number] Date of Birth: Jul 23, 1950 Referring Provider (PT): Stan HeadGessner, Carl   Encounter Date: 11/10/2018  PT End of Session - 11/14/18 1306    Visit Number  2    Number of Visits  10    Date for PT Re-Evaluation  01/12/19    Authorization Type  [employer]    Authorization Time Period  through 01/12/2019    Authorization - Visit Number  2    Authorization - Number of Visits  10    PT Start Time  0900    PT Stop Time  1000    PT Time Calculation (min)  60 min    Activity Tolerance  Patient tolerated treatment well;No increased pain    Behavior During Therapy  WFL for tasks assessed/performed       Past Medical History:  Diagnosis Date  . Abnormal cardiovascular stress test 08/21/2016   a. done for abnl EKG/cardiac risk factors -> admitted for cath 07/2016 which showed no significant CAD, normal LV contraction, mildly elevated filling pressure. Low dose Imdur was added for possible component of microvascular dysfunction.   . Allergy   . Anxiety   . Arthritis    "back, knees, fingers" (05/10/2017)  . Chronic lower back pain   . Colon polyps   . Deafness in right ear   . Depression   . Diverticulosis   . Hx of adenomatous colonic polyps 05/08/2003  . Hypertension   . Hypertriglyceridemia   . Hypothyroidism   . Migraine    "none in the 2000s" (05/10/2017)  . Osteoporosis   . Paroxysmal atrial  fibrillation (HCC)   . Pneumonia    "several times" (05/10/2017)  . PONV (postoperative nausea and vomiting)   . S/P cardiac catheterization, 08/20/16 minimal CAD 08/21/2016  . Type II diabetes mellitus (HCC)   . Voice tremor     Past Surgical History:  Procedure Laterality Date  . ABDOMINAL HYSTERECTOMY  1988  . ATRIAL FIBRILLATION ABLATION N/A 05/10/2017   Procedure: ATRIAL FIBRILLATION ABLATION;  Surgeon: Hillis RangeAllred, James, MD;  Location: [facility];  Service: Cardiovascular;  Laterality: N/A;  . COCHLEAR IMPLANT Right 2008  . COLONOSCOPY    . LEFT HEART CATH AND CORONARY ANGIOGRAPHY N/A 08/20/2016   Procedure: Left Heart Cath and Coronary Angiography;  Surgeon: Yvonne KendallEnd, Christopher, MD;  Location: [facility];  Service: Cardiovascular;  Laterality: N/A;  . LOOP RECORDER INSERTION N/A 11/29/2017   Procedure: LOOP RECORDER INSERTION;  Surgeon: Hillis RangeAllred, James, MD;  Location: [facility];  Service: Cardiovascular;  Laterality: N/A;    There were no vitals filed for this visit.    Pelvic Floor Physical Therapy Treatment Note  SCREENING  Changes in medications, allergies, or medical history?: none    SUBJECTIVE  Patient reports: No changes since last visit. Feels like she is having spasms in her bottom at night on both sides.  Precautions:  Osteoporosis, Deaf in R ear, Depression.  Pain update:  Location of  pain: Tailbone Current pain:  6/10  Max pain:  6/10 Least pain:  2/10 Nature of pain: Sharp  Patient Goals: Control bowels and flatulence better. Get rid of pain.   OBJECTIVE  Changes in: Posture/Observations:  R up-slip, LLE long.  PSIS nearly level with heel-lift, some mobility likely lacking.  Palpation: TTP to R>L Iliacus, QL, and erector spinae through R lumbar region.   INTERVENTIONS THIS SESSION: Manual: Performed TP release and STM to R Iliacus, QL, and erector spinae through R lumbar region to decrease spasm and pain and allow for  improved balance of musculature for improved function and decreased symptoms. Performed R up-slip correction following TP release to allow for improved pelvic alignment and decreased pressure on lumbosacral nerves. Dry-needle: Performed TPDN with a .30x46mm needle and standard approach to R Iliacus, QL, and erector spinae through R lumbar region to decrease spasm and pain and allow for improved balance of musculature for improved function and decreased symptoms. Therex: Educated on and practiced R side-stretch and hip-flexor stretch To maintain and improve muscle length and allow for improved balance of musculature for long-term symptom relief. Self-care: Educated on how to gradually increase heel-lift wear time to minimize pain as the body adapts.  Total time: 60 min.                    Trigger Point Dry Needling - 11/14/18 0001    Consent Given?  Yes    Education Handout Provided  No    Muscles Treated Back/Hip  Iliacus;Erector spinae;Quadratus lumborum    Iliacus Response  Twitch response elicited;Palpable increased muscle length    Erector spinae Response  Twitch response elicited;Palpable increased muscle length    Quadratus Lumborum Response  Twitch response elicited;Palpable increased muscle length             PT Short Term Goals - 11/06/18 0830      PT SHORT TERM GOAL #1   Title  Patient will demonstrate functional recruitment of TA with breathing, sit-to-stand, squatting/lifting, and walking to allow for improved pelvic brace coordination, improved balance, and decreased downward pressure on the pelvic organs.    Baseline  Pt. describes rectal prolapse on questionnaire    Time  5    Period  Weeks    Status  New    Target Date  12/08/18      PT SHORT TERM GOAL #2   Title  Patient will demonstrate improved pelvic alignment and balance of musculature surrounding the pelvis to facilitate decreased PFM spasms and decrease sacral pain.    Baseline  R up-slip  and LLE long    Time  5    Period  Weeks    Status  New    Target Date  12/08/18      PT SHORT TERM GOAL #3   Title  Patient will report a reduction in pain to no greater than 3/10 over the prior week to demonstrate symptom improvement.    Baseline  5/10 max pain    Time  5    Period  Weeks    Status  New    Target Date  12/08/18        PT Long Term Goals - 11/06/18 0835      PT LONG TERM GOAL #1   Title  Patient will report no episodes of SUI/FI over the course of the prior two weeks to demonstrate improved functional ability.    Baseline  Having SUI with cough/sneeze/bend and  FI with loose stool as well as gas incontinence.    Time  10    Period  Weeks    Status  New    Target Date  01/12/19      PT LONG TERM GOAL #2   Title  Patient will score at or below 97/300 on the PFDI and 29/300 on the PFIQ to demonstrate a clinically meaningful decrease in disability and distress due to pelvic floor dysfunction.    Baseline  PFDI: 187/300, PFIQ:119/300,    Time  10    Period  Weeks    Status  New    Target Date  01/12/19      PT LONG TERM GOAL #3   Title  Patient will score at or below 25% on the PGQ to demonstrate a clinically significant decrease in disability and improved functional ability.    Baseline  PDI: 33/60 (55%)    Time  10    Period  Weeks    Status  New    Target Date  01/12/19            Plan - 11/14/18 1307    Clinical Impression Statement  Pt. Responded well to all interventions today, demonstrating improved pain from 6/10 B to 0/10 on R and 4/10 on L as well as understanding and correct performance of all education and exercises provided today. They will continue to benefit from skilled physical therapy to work toward remaining goals and maximize function as well as decrease likelihood of symptom increase or recurrence.     Personal Factors and Comorbidities  Comorbidity 3+    Comorbidities  Osteoporosis, diabetes, Depression    Examination-Activity  Limitations  Continence;Toileting;Sit    Examination-Participation Restrictions  Interpersonal Relationship;Community Activity;Shop;Driving    Stability/Clinical Decision Making  Unstable/Unpredictable    Rehab Potential  Good    PT Frequency  1x / week    PT Duration  Other (comment)   10 weeks   PT Treatment/Interventions  ADLs/Self Care Home Management;Biofeedback;Electrical Stimulation;Therapeutic activities;Functional mobility training;Therapeutic exercise;Balance training;Neuromuscular re-education;Orthotic Fit/Training;Manual techniques;Scar mobilization;Taping;Dry needling;Passive range of motion;Spinal Manipulations;Joint Manipulations    PT Next Visit Plan  PA to sacrum PRN, DN PRN, educate on water intake ask about heel-lift, re-assess gait, address sitting posture, give bow-and-arrow, thoracic EXT of towel roll    PT Home Exercise Plan  Standing posture, side-stretch, hip-flexor stretch (proposal)    Consulted and Agree with Plan of Care  Patient       Patient will benefit from skilled therapeutic intervention in order to improve the following deficits and impairments:  Decreased balance, Increased muscle spasms, Improper body mechanics, Decreased scar mobility, Decreased activity tolerance, Decreased coordination, Decreased strength, Postural dysfunction, Pain  Visit Diagnosis: Muscle weakness (generalized)  Abnormal posture  Other muscle spasm     Problem List Patient Active Problem List   Diagnosis Date Noted  . Paroxysmal atrial fibrillation (HCC) 05/10/2017  . Hypothyroidism 02/24/2017  . 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 S/P cardiac catheterization, 08/20/16 minimal CAD  08/21/2016  . Unstable angina (HCC)   . DDD (degenerative disc disease), lumbar 06/29/2016  . Meniere's disease 06/29/2016  . Diabetes mellitus, type 2 (HCC) 09/30/2014  . Hyperlipemia 09/30/2014  . Hypertension 09/30/2014  . Hx of adenomatous colonic polyps 05/08/2003   Cleophus MoltKeeli T. Adrena Nakamura DPT,  ATC Cleophus MoltKeeli T Ananya Mccleese 11/14/2018, 1:15 PM  [place] [facility] [address] [place], [place], [address] Phone: [phone]   Fax:  [phone]  Name: OBDULIA STEIER MRN: [id number] Date of Birth: 01/05/1951

## 2018-11-16 ENCOUNTER — Encounter: Payer: Self-pay | Admitting: Cardiology

## 2018-11-17 ENCOUNTER — Ambulatory Visit: Payer: Medicare Other

## 2018-11-17 ENCOUNTER — Other Ambulatory Visit: Payer: Self-pay

## 2018-11-17 DIAGNOSIS — M6281 Muscle weakness (generalized): Secondary | ICD-10-CM | POA: Diagnosis not present

## 2018-11-17 DIAGNOSIS — M62838 Other muscle spasm: Secondary | ICD-10-CM

## 2018-11-17 DIAGNOSIS — R293 Abnormal posture: Secondary | ICD-10-CM | POA: Diagnosis not present

## 2018-11-17 NOTE — Therapy (Signed)
[place] MAIN [facility] SERVICES [address] [place], [place], [address] Phone: [phone]   Fax:  [phone]  Physical Therapy Treatment  The patient has been informed of current processes in place at Outpatient Rehab to protect patients from Covid-19 exposure including social distancing, schedule modifications, and new cleaning procedures. After discussing their particular risk with a therapist based on the patient's personal risk factors, the patient has decided to proceed with in-person therapy.   Patient Details  Name: Ashley Woodard MRN: [id number] Date of Birth: [birth year] Referring Provider (PT): Silvano Rusk   Encounter Date: 11/17/2018  PT End of Session - 11/17/18 1133    Visit Number  3    Number of Visits  10    Date for PT Re-Evaluation  01/12/19    Authorization Type  [employer]    Authorization Time Period  through 01/12/2019    Authorization - Visit Number  3    Authorization - Number of Visits  10    PT Start Time  9518    PT Stop Time  1130    PT Time Calculation (min)  60 min    Activity Tolerance  Patient tolerated treatment well;No increased pain    Behavior During Therapy  WFL for tasks assessed/performed       Past Medical History:  Diagnosis Date  . Abnormal cardiovascular stress test 08/21/2016   a. done for abnl EKG/cardiac risk factors -> admitted for cath 07/2016 which showed no significant CAD, normal LV contraction, mildly elevated filling pressure. Low dose Imdur was added for possible component of microvascular dysfunction.   . Allergy   . Anxiety   . Arthritis    "back, knees, fingers" (05/10/2017)  . Chronic lower back pain   . Colon polyps   . Deafness in right ear   . Depression   . Diverticulosis   . Hx of adenomatous colonic polyps 05/08/2003  . Hypertension   . Hypertriglyceridemia   . Hypothyroidism   . Migraine    "none in the 2000s" (05/10/2017)  . Osteoporosis   . Paroxysmal atrial  fibrillation (HCC)   . Pneumonia    "several times" (05/10/2017)  . PONV (postoperative nausea and vomiting)   . S/P cardiac catheterization, 08/20/16 minimal CAD 08/21/2016  . Type II diabetes mellitus ([place])   . Voice tremor     Past Surgical History:  Procedure Laterality Date  . ABDOMINAL HYSTERECTOMY  1988  . ATRIAL FIBRILLATION ABLATION N/A 05/10/2017   Procedure: ATRIAL FIBRILLATION ABLATION;  Surgeon: Thompson Grayer, MD;  Location: [place] CV LAB;  Service: Cardiovascular;  Laterality: N/A;  . COCHLEAR IMPLANT Right 2008  . COLONOSCOPY    . LEFT HEART CATH AND CORONARY ANGIOGRAPHY N/A 08/20/2016   Procedure: Left Heart Cath and Coronary Angiography;  Surgeon: Nelva Bush, MD;  Location: [place] CV LAB;  Service: Cardiovascular;  Laterality: N/A;  . LOOP RECORDER INSERTION N/A 11/29/2017   Procedure: LOOP RECORDER INSERTION;  Surgeon: Thompson Grayer, MD;  Location: [place] CV LAB;  Service: Cardiovascular;  Laterality: N/A;    There were no vitals filed for this visit.      Pelvic Floor Physical Therapy Treatment Note  SCREENING  Changes in medications, allergies, or medical history?: none    SUBJECTIVE  Patient reports: Has still been having spasms in her bottom more on the Right side than the L, no pain in the R hip, less in the L. Feels like the heel-lift is helping  a lot.  Precautions:  Osteoporosis, Deaf in R ear, Depression.  Pain update:  Location of pain: L tailbone area Current pain:  2/10  Max pain:  6/10 Least pain:  2/10 Nature of pain: dull/achy  Patient Goals: Control bowels and flatulence better. Get rid of pain.   OBJECTIVE  Changes in: Posture/Observations:  R PSIS very slightly high pre-treatment   Palpation: TTP to L Lumbar erector spinae and multifidus, OI, Piriformis and Glute Med and Max.   INTERVENTIONS THIS SESSION: Manual: Performed TP release and STM to L Lumbar erector spinae and multifidus, OI, Piriformis  and Glute Med and Max to decrease spasm and pain and allow for improved balance of musculature for improved function and decreased symptoms. Performed grade 3-4 PA mobs to L sacral border to improve mobility of joint and surrounding connective tissue and decrease pressure on nerve roots for improved conductivity and function of down-stream tissues.  Dry-needle: Performed TPDN with a .30x7675mm needle and standard approach as described below to decrease spasm and pain and allow for improved balance of musculature for improved function and decreased symptoms. Therex: Educated on and practiced pelvic tilts and low-back stretch To maintain and improve muscle length and allow for improved balance of musculature for long-term symptom relief.  Total time: 60 min.                  Trigger Point Dry Needling - 11/17/18 0001    Consent Given?  Yes    Education Handout Provided  No    Muscles Treated Back/Hip  Gluteus minimus;Gluteus maximus;Piriformis;Erector spinae;Lumbar multifidi;Obturator internus    Dry Needling Comments  Left    Gluteus Minimus Response  Twitch response elicited;Palpable increased muscle length    Gluteus Maximus Response  Twitch response elicited;Palpable increased muscle length    Piriformis Response  Twitch response elicited;Palpable increased muscle length    Lumbar multifidi Response  Twitch response elicited;Palpable increased muscle length    Obturator internus Response  Twitch response elicited;Palpable increased muscle length             PT Short Term Goals - 11/06/18 0830      PT SHORT TERM GOAL #1   Title  Patient will demonstrate functional recruitment of TA with breathing, sit-to-stand, squatting/lifting, and walking to allow for improved pelvic brace coordination, improved balance, and decreased downward pressure on the pelvic organs.    Baseline  Pt. describes rectal prolapse on questionnaire    Time  5    Period  Weeks    Status  New     Target Date  12/08/18      PT SHORT TERM GOAL #2   Title  Patient will demonstrate improved pelvic alignment and balance of musculature surrounding the pelvis to facilitate decreased PFM spasms and decrease sacral pain.    Baseline  R up-slip and LLE long    Time  5    Period  Weeks    Status  New    Target Date  12/08/18      PT SHORT TERM GOAL #3   Title  Patient will report a reduction in pain to no greater than 3/10 over the prior week to demonstrate symptom improvement.    Baseline  5/10 max pain    Time  5    Period  Weeks    Status  New    Target Date  12/08/18        PT Long Term Goals - 11/06/18 [id number]  PT LONG TERM GOAL #1   Title  Patient will report no episodes of SUI/FI over the course of the prior two weeks to demonstrate improved functional ability.    Baseline  Having SUI with cough/sneeze/bend and FI with loose stool as well as gas incontinence.    Time  10    Period  Weeks    Status  New    Target Date  01/12/19      PT LONG TERM GOAL #2   Title  Patient will score at or below 97/300 on the PFDI and 29/300 on the PFIQ to demonstrate a clinically meaningful decrease in disability and distress due to pelvic floor dysfunction.    Baseline  PFDI: 187/300, PFIQ:119/300,    Time  10    Period  Weeks    Status  New    Target Date  01/12/19      PT LONG TERM GOAL #3   Title  Patient will score at or below 25% on the PGQ to demonstrate a clinically significant decrease in disability and improved functional ability.    Baseline  PDI: 33/60 (55%)    Time  10    Period  Weeks    Status  New    Target Date  01/12/19            Plan - 11/17/18 1133    Clinical Impression Statement  Pt. Responded well to all interventions today, demonstrating improved sacral mobility, decreased pain from 2/10 to 0/10 and decreased spasms as well as understanding and correct performance of all education and exercises provided today. They will continue to benefit from skilled  physical therapy to work toward remaining goals and maximize function as well as decrease likelihood of symptom increase or recurrence.     Personal Factors and Comorbidities  Comorbidity 3+    Comorbidities  Osteoporosis, diabetes, Depression    Examination-Activity Limitations  Continence;Toileting;Sit    Examination-Participation Restrictions  Interpersonal Relationship;Community Activity;Shop;Driving    Stability/Clinical Decision Making  Unstable/Unpredictable    Rehab Potential  Good    PT Frequency  1x / week    PT Duration  Other (comment)   10 weeks   PT Treatment/Interventions  ADLs/Self Care Home Management;Biofeedback;Electrical Stimulation;Therapeutic activities;Functional mobility training;Therapeutic exercise;Balance training;Neuromuscular re-education;Orthotic Fit/Training;Manual techniques;Scar mobilization;Taping;Dry needling;Passive range of motion;Spinal Manipulations;Joint Manipulations    PT Next Visit Plan  educate on water intake, re-assess gait, address sitting posture at work/lumbar support, give bow-and-arrow, thoracic EXT of towel roll    PT Home Exercise Plan  Standing posture, side-stretch, hip-flexor stretch (proposal), pelvic tilts, low back stretch    Consulted and Agree with Plan of Care  Patient       Patient will benefit from skilled therapeutic intervention in order to improve the following deficits and impairments:  Decreased balance, Increased muscle spasms, Improper body mechanics, Decreased scar mobility, Decreased activity tolerance, Decreased coordination, Decreased strength, Postural dysfunction, Pain  Visit Diagnosis: Muscle weakness (generalized)  Abnormal posture  Other muscle spasm     Problem List Patient Active Problem List   Diagnosis Date Noted  . Paroxysmal atrial fibrillation (HCC) 05/10/2017  . Hypothyroidism 02/24/2017  . 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 S/P cardiac catheterization, 08/20/16 minimal CAD  08/21/2016  .  Unstable angina (HCC)   . DDD (degenerative disc disease), lumbar 06/29/2016  . Meniere's disease 06/29/2016  . Diabetes mellitus, type 2 (HCC) 09/30/2014  . Hyperlipemia 09/30/2014  . Hypertension 09/30/2014  . Hx of adenomatous colonic polyps 05/08/2003  Cleophus Molt DPT, ATC Cleophus Molt 11/17/2018, 11:39 AM  [place] [facility] MAIN [facility] SERVICES [address] [place], [place], [address] Phone: [phone]   Fax:  [phone]  Name: LAURALYN SHADOWENS MRN: [id number] Date of Birth: March 11, 1950

## 2018-11-17 NOTE — Patient Instructions (Signed)
   Do 2 sets of 15 tilts per day. Breathe in when you tilt forward (A) and out when you tuck under (B).    Tuck your hips under, then keep the tuck as you lean forward so you feel a stretch through your low back. Hold for 5 deep breaths, repeat 2-3 times, 1-2 times per day. 

## 2018-11-21 ENCOUNTER — Other Ambulatory Visit: Payer: Self-pay | Admitting: Physician Assistant

## 2018-11-21 MED ORDER — SIMVASTATIN 20 MG PO TABS: ORAL_TABLET | ORAL | 1 refills | Status: DC

## 2018-11-22 ENCOUNTER — Encounter: Payer: Self-pay | Admitting: Emergency Medicine

## 2018-11-22 ENCOUNTER — Other Ambulatory Visit: Payer: Self-pay | Admitting: Emergency Medicine

## 2018-11-22 ENCOUNTER — Encounter: Payer: Self-pay | Admitting: Physician Assistant

## 2018-11-22 DIAGNOSIS — E7849 Other hyperlipidemia: Secondary | ICD-10-CM

## 2018-11-22 MED ORDER — SIMVASTATIN 20 MG PO TABS: 20.0000 mg | ORAL_TABLET | ORAL | 1 refills | Status: DC

## 2018-11-24 ENCOUNTER — Ambulatory Visit: Payer: Medicare Other

## 2018-11-27 ENCOUNTER — Ambulatory Visit (INDEPENDENT_AMBULATORY_CARE_PROVIDER_SITE_OTHER): Payer: Medicare Other | Admitting: *Deleted

## 2018-11-27 DIAGNOSIS — I480 Paroxysmal atrial fibrillation: Secondary | ICD-10-CM

## 2018-11-28 ENCOUNTER — Other Ambulatory Visit: Payer: Self-pay | Admitting: Physician Assistant

## 2018-11-28 LAB — CUP PACEART REMOTE DEVICE CHECK
Date Time Interrogation Session: 20201005164015
Implantable Pulse Generator Implant Date: 20191008

## 2018-12-01 ENCOUNTER — Other Ambulatory Visit: Payer: Self-pay

## 2018-12-01 ENCOUNTER — Ambulatory Visit: Payer: Medicare Other | Attending: Internal Medicine

## 2018-12-01 DIAGNOSIS — R293 Abnormal posture: Secondary | ICD-10-CM

## 2018-12-01 DIAGNOSIS — M62838 Other muscle spasm: Secondary | ICD-10-CM | POA: Diagnosis not present

## 2018-12-01 DIAGNOSIS — M6281 Muscle weakness (generalized): Secondary | ICD-10-CM | POA: Diagnosis not present

## 2018-12-01 NOTE — Therapy (Signed)
[place] MAIN [facility] SERVICES [address] [place], [place], [address] Phone: [phone]   Fax:  [phone]  Physical Therapy Treatment  The patient has been informed of current processes in place at Outpatient Rehab to protect patients from Covid-19 exposure including social distancing, schedule modifications, and new cleaning procedures. After discussing their particular risk with a therapist based on the patient's personal risk factors, the patient has decided to proceed with in-person therapy.   Patient Details  Name: Ashley Woodard MRN: [id number] Date of Birth: [birth year] Referring Provider (PT): Silvano Rusk   Encounter Date: 12/01/2018  PT End of Session - 12/01/18 1210    Visit Number  4    Number of Visits  10    Date for PT Re-Evaluation  01/12/19    Authorization Type  [employer]    Authorization Time Period  through 01/12/2019    Authorization - Visit Number  4    Authorization - Number of Visits  10    PT Start Time  3557    PT Stop Time  1140    PT Time Calculation (min)  60 min    Activity Tolerance  Patient tolerated treatment well;No increased pain    Behavior During Therapy  WFL for tasks assessed/performed       Past Medical History:  Diagnosis Date  . Abnormal cardiovascular stress test 08/21/2016   a. done for abnl EKG/cardiac risk factors -> admitted for cath 07/2016 which showed no significant CAD, normal LV contraction, mildly elevated filling pressure. Low dose Imdur was added for possible component of microvascular dysfunction.   . Allergy   . Anxiety   . Arthritis    "back, knees, fingers" (05/10/2017)  . Chronic lower back pain   . Colon polyps   . Deafness in right ear   . Depression   . Diverticulosis   . Hx of adenomatous colonic polyps 05/08/2003  . Hypertension   . Hypertriglyceridemia   . Hypothyroidism   . Migraine    "none in the 2000s" (05/10/2017)  . Osteoporosis   . Paroxysmal atrial  fibrillation (HCC)   . Pneumonia    "several times" (05/10/2017)  . PONV (postoperative nausea and vomiting)   . S/P cardiac catheterization, 08/20/16 minimal CAD 08/21/2016  . Type II diabetes mellitus ([place])   . Voice tremor     Past Surgical History:  Procedure Laterality Date  . ABDOMINAL HYSTERECTOMY  1988  . ATRIAL FIBRILLATION ABLATION N/A 05/10/2017   Procedure: ATRIAL FIBRILLATION ABLATION;  Surgeon: Thompson Grayer, MD;  Location: [place] [facility];  Service: Cardiovascular;  Laterality: N/A;  . COCHLEAR IMPLANT Right 2008  . COLONOSCOPY    . LEFT HEART CATH AND CORONARY ANGIOGRAPHY N/A 08/20/2016   Procedure: Left Heart Cath and Coronary Angiography;  Surgeon: Nelva Bush, MD;  Location: [place] [facility];  Service: Cardiovascular;  Laterality: N/A;  . LOOP RECORDER INSERTION N/A 11/29/2017   Procedure: LOOP RECORDER INSERTION;  Surgeon: Thompson Grayer, MD;  Location: [place] [facility];  Service: Cardiovascular;  Laterality: N/A;    There were no vitals filed for this visit.     Pelvic Floor Physical Therapy Treatment Note  SCREENING  Changes in medications, allergies, or medical history?: none    SUBJECTIVE  Patient reports: Patient reports lifting increases urinary incontinence (symptoms)- worse on a softer surface. Is sitting on the L side of the couch with knees pulled up under her on the R. (R side-bending)  Precautions:  Osteoporosis, Deaf in R ear, Depression.  Pain update:  Location of pain: L tailbone area Current pain:  2/10  Max pain:  6/10 Least pain:  2/10 Nature of pain: dull/achy  Patient Goals: Control bowels and flatulence better. Get rid of pain.   OBJECTIVE  Changes in: Posture/Observations:  R PSIS very slightly high pre-treatment   Pelvic Floor External Exam: Introitus Appears: normal Skin integrity: normal Palpation: no TTP Cough: intact Prolapse visible?: none Scar mobility: none noted  Internal Vaginal  Exam: Strength (PERF): 2/5, accessory glute and adductor firing, 4 seconds Symmetry: R>L, Posterior>Anterior Palpation: R:OI, IC, coccygeus, mild to PR anteriorly, L: IC and coccygeus Prolapse: none   Palpation: TTP to L adductors proximally (tightness B)   INTERVENTIONS THIS SESSION: Manual: Performed TP release internally to R IC, OI, coccygeus and anterior PR and on L to IC, coccygeus and anterior and posterior PR to decrease spasm and pain and allow for improved balance of musculature for improved function and decreased symptoms. (40 minutes)   NM re-ed: reviewed diaphragmatic breathing and educated on it in relation to PFM motion. Practiced PFM squeezes with verbal and tactile cueing for improved recruitment and coordination. Used VC to decrease accessory muscle recruitment. (15 minutes)   Therex: Educated on and practiced supine butterfly stretch x10 breaths To maintain and improve muscle length and allow for improved balance of musculature for long-term symptom relief. (5 min)   Total time: 60 min.                           PT Short Term Goals - 11/06/18 0830      PT SHORT TERM GOAL #1   Title  Patient will demonstrate functional recruitment of TA with breathing, sit-to-stand, squatting/lifting, and walking to allow for improved pelvic brace coordination, improved balance, and decreased downward pressure on the pelvic organs.    Baseline  Pt. describes rectal prolapse on questionnaire    Time  5    Period  Weeks    Status  New    Target Date  12/08/18      PT SHORT TERM GOAL #2   Title  Patient will demonstrate improved pelvic alignment and balance of musculature surrounding the pelvis to facilitate decreased PFM spasms and decrease sacral pain.    Baseline  R up-slip and LLE long    Time  5    Period  Weeks    Status  New    Target Date  12/08/18      PT SHORT TERM GOAL #3   Title  Patient will report a reduction in pain to no greater than 3/10  over the prior week to demonstrate symptom improvement.    Baseline  5/10 max pain    Time  5    Period  Weeks    Status  New    Target Date  12/08/18        PT Long Term Goals - 11/06/18 0835      PT LONG TERM GOAL #1   Title  Patient will report no episodes of SUI/FI over the course of the prior two weeks to demonstrate improved functional ability.    Baseline  Having SUI with cough/sneeze/bend and FI with loose stool as well as gas incontinence.    Time  10    Period  Weeks    Status  New    Target Date  01/12/19  PT LONG TERM GOAL #2   Title  Patient will score at or below 97/300 on the PFDI and 29/300 on the PFIQ to demonstrate a clinically meaningful decrease in disability and distress due to pelvic floor dysfunction.    Baseline  PFDI: 187/300, PFIQ:119/300,    Time  10    Period  Weeks    Status  New    Target Date  01/12/19      PT LONG TERM GOAL #3   Title  Patient will score at or below 25% on the PGQ to demonstrate a clinically significant decrease in disability and improved functional ability.    Baseline  PDI: 33/60 (55%)    Time  10    Period  Weeks    Status  New    Target Date  01/12/19            Plan - 12/01/18 1211    Clinical Impression Statement  Pt. Responded well to all interventions today, demonstrating decreased tenderness in internal PFM, L coccygeal posterior pain continued in hook-lying position, improved diaphragmatic breathing, requiring less verbal cues, as well as understanding and correct performance of all education and exercises provided today. They will continue to benefit from skilled physical therapy to work toward remaining goals and maximize function as well as decrease likelihood of symptom increase or recurrence.    Personal Factors and Comorbidities  Comorbidity 3+    Comorbidities  Osteoporosis, diabetes, Depression    Examination-Activity Limitations  Continence;Toileting;Sit    Examination-Participation Restrictions   Interpersonal Relationship;Community Activity;Shop;Driving    Stability/Clinical Decision Making  Unstable/Unpredictable    Rehab Potential  Good    PT Frequency  1x / week    PT Duration  Other (comment)   10 weeks   PT Treatment/Interventions  ADLs/Self Care Home Management;Biofeedback;Electrical Stimulation;Therapeutic activities;Functional mobility training;Therapeutic exercise;Balance training;Neuromuscular re-education;Orthotic Fit/Training;Manual techniques;Scar mobilization;Taping;Dry needling;Passive range of motion;Spinal Manipulations;Joint Manipulations    PT Next Visit Plan  educate on water intake, re-assess gait, address sitting posture at work/lumbar support, give bow-and-arrow, thoracic EXT of towel roll; deep breathing practice review- reassess PSIS alignment- sacral mobs    PT Home Exercise Plan  Standing posture, side-stretch, hip-flexor stretch (proposal), pelvic tilts, low back stretch    Consulted and Agree with Plan of Care  Patient       Patient will benefit from skilled therapeutic intervention in order to improve the following deficits and impairments:  Decreased balance, Increased muscle spasms, Improper body mechanics, Decreased scar mobility, Decreased activity tolerance, Decreased coordination, Decreased strength, Postural dysfunction, Pain  Visit Diagnosis: Muscle weakness (generalized)  Abnormal posture  Other muscle spasm     Problem List Patient Active Problem List   Diagnosis Date Noted  . Paroxysmal atrial fibrillation (HCC) 05/10/2017  . Hypothyroidism 02/24/2017  . 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 S/P cardiac catheterization, 08/20/16 minimal CAD  08/21/2016  . Unstable angina (HCC)   . DDD (degenerative disc disease), lumbar 06/29/2016  . Meniere's disease 06/29/2016  . Diabetes mellitus, type 2 (HCC) 09/30/2014  . Hyperlipemia 09/30/2014  . Hypertension 09/30/2014  . Hx of adenomatous colonic polyps 05/08/2003   Cleophus Molt DPT,  ATC Cleophus Molt 12/01/2018, 12:12 PM  [place] [facility] MAIN [facility] SERVICES [address] [place], [place], [address] Phone: [phone]   Fax:  [phone]  Name: Ashley Woodard MRN: [id number] Date of Birth: 07-Jul-1950

## 2018-12-01 NOTE — Patient Instructions (Signed)
Stabilization: Diaphragmatic Breathing    Lie with knees bent, feet flat. Place one hand on stomach, other on chest. Breathe deeply through nose, lifting belly hand without any motion of hand on chest. Do this for ~ 5 min. Per night before sleeping to decrease pian, improve your coordination of breathing. Also do this throughout the day when you feel your stress levels rising to help decrease pain and tension.   (also good to do at bed-time)  Bring feet together and let the knees fall out to the sides. Hold for 10 deep breaths, repeat 2 more times.

## 2018-12-04 NOTE — Progress Notes (Signed)
Carelink Summary Report / Loop Recorder 

## 2018-12-08 ENCOUNTER — Ambulatory Visit: Payer: Medicare Other

## 2018-12-14 DIAGNOSIS — Z23 Encounter for immunization: Secondary | ICD-10-CM | POA: Diagnosis not present

## 2018-12-15 ENCOUNTER — Other Ambulatory Visit: Payer: Self-pay

## 2018-12-15 ENCOUNTER — Ambulatory Visit: Payer: Medicare Other

## 2018-12-15 DIAGNOSIS — M62838 Other muscle spasm: Secondary | ICD-10-CM

## 2018-12-15 DIAGNOSIS — M6281 Muscle weakness (generalized): Secondary | ICD-10-CM

## 2018-12-15 DIAGNOSIS — R293 Abnormal posture: Secondary | ICD-10-CM

## 2018-12-15 NOTE — Patient Instructions (Addendum)
Urge supression technique: (works for bowel and bladder incontinence)   1) Take a deep breath to convince yourself that you are in control and calm the nervous system.  2) Do 5 "quick-flick" kegels (pelvic floor muscle contractions) and re-assess the urge. Repeat another set if urge is still present. 3) Once the urge has decreased, start walking calmly to the the bathroom. Stop and repeat steps 1 and 2 as many times as needed until you can successfully get to the toilet. 4) Only once seated, take a deep breath and allow the pelvic floor muscles to relax and allow for the urine to flow.    Do not be discouraged if you are not successful the first couple times, this is normal and it will take practice but remember that YOU are in control. Start by practicing this at home where you do not have to worry as much if there were to be an accident. Allowing yourself to get rushed or nervous puts the bladder back in control and will not allow the technique to work.    EXhale on EXertion!!!!! (pushing, pulling, lifting, standing, etc.)  Whenever you exert force you need to exhale to allow the pressure to escape out of your vocal cords rather than be pressed down through your pelvic floor. Exhaling also helps engage your deep-core muscles to protect your back and pelvic floor.  Pillows for support when sleeping     Sleep with a pillow between your knees to decrease tailbone discomfort.   Hold this pose for 10 deep breaths, 1 time per day. If pain is high you can perform this as needed.     Do 2 sets of 15 tilts per day. Breathe in when you tilt forward (A) and out when you tuck under (B).

## 2018-12-15 NOTE — Therapy (Signed)
[place] [facility] MAIN [facility] SERVICES [address] [place], [place], [address] Phone: [phone]   Fax:  [phone]  Physical Therapy Treatment The patient has been informed of current processes in place at Outpatient Rehab to protect patients from Covid-19 exposure including social distancing, schedule modifications, and new cleaning procedures. After discussing their particular risk with a therapist based on the patient's personal risk factors, the patient has decided to proceed with in-person therapy.  Patient Details  Name: Ashley Woodard MRN: [id number] Date of Birth: Aug 18, 1950 Referring Provider (PT): Stan Head   Encounter Date: 12/15/2018  PT End of Session - 12/15/18 1124    Visit Number  5    Number of Visits  10    Date for PT Re-Evaluation  01/12/19    Authorization Type  [employer]    Authorization Time Period  through 01/12/2019    Authorization - Visit Number  5    Authorization - Number of Visits  10    PT Start Time  1030    PT Stop Time  1130    PT Time Calculation (min)  60 min    Activity Tolerance  Patient tolerated treatment well;No increased pain    Behavior During Therapy  WFL for tasks assessed/performed       Past Medical History:  Diagnosis Date  . Abnormal cardiovascular stress test 08/21/2016   a. done for abnl EKG/cardiac risk factors -> admitted for cath 07/2016 which showed no significant CAD, normal LV contraction, mildly elevated filling pressure. Low dose Imdur was added for possible component of microvascular dysfunction.   . Allergy   . Anxiety   . Arthritis    "back, knees, fingers" (05/10/2017)  . Chronic lower back pain   . Colon polyps   . Deafness in right ear   . Depression   . Diverticulosis   . Hx of adenomatous colonic polyps 05/08/2003  . Hypertension   . Hypertriglyceridemia   . Hypothyroidism   . Migraine    "none in the 2000s" (05/10/2017)  . Osteoporosis   . Paroxysmal atrial fibrillation  (HCC)   . Pneumonia    "several times" (05/10/2017)  . PONV (postoperative nausea and vomiting)   . S/P cardiac catheterization, 08/20/16 minimal CAD 08/21/2016  . Type II diabetes mellitus (HCC)   . Voice tremor     Past Surgical History:  Procedure Laterality Date  . ABDOMINAL HYSTERECTOMY  1988  . ATRIAL FIBRILLATION ABLATION N/A 05/10/2017   Procedure: ATRIAL FIBRILLATION ABLATION;  Surgeon: Hillis Range, MD;  Location: [facility];  Service: Cardiovascular;  Laterality: N/A;  . COCHLEAR IMPLANT Right 2008  . COLONOSCOPY    . LEFT HEART CATH AND CORONARY ANGIOGRAPHY N/A 08/20/2016   Procedure: Left Heart Cath and Coronary Angiography;  Surgeon: Yvonne Kendall, MD;  Location: [facility];  Service: Cardiovascular;  Laterality: N/A;  . LOOP RECORDER INSERTION N/A 11/29/2017   Procedure: LOOP RECORDER INSERTION;  Surgeon: Hillis Range, MD;  Location: [facility];  Service: Cardiovascular;  Laterality: N/A;    There were no vitals filed for this visit.     Pelvic Floor Physical Therapy Treatment Note  SCREENING  Changes in medications, allergies, or medical history?: none    SUBJECTIVE  Patient reports: With butterflies R groin pain. Says diaphragmatic breathing has gotten easier. Bowel incontinence (one accident this week); occassionally bowel leakage- usually during urgency. Has been changing positions on the couch.    Precautions:  Osteoporosis, Deaf in R ear, Depression.  Pain update:  Location of pain: L tailbone area (varies day to day)  Current pain:  1-02/24/08  Max pain:  3/10 (with stairs or sitting for long periods of time)  Least pain:  0/10 Nature of pain: dull/achy  R groin pain- feels like a cyst on her ovaries- hurts the worse with she is in bed. Lasted a few days then it went away.   Patient Goals: Control bowels and flatulence better. Get rid of pain.   OBJECTIVE  Changes in: Posture/Observations:  R PSIS very slightly  high pre-treatment   Pelvic Floor External Exam: Introitus Appears: normal Skin integrity: normal Palpation: no TTP Cough: intact Prolapse visible?: none Scar mobility: none noted  Internal Vaginal Exam: Strength (PERF): 2/5, accessory glute and adductor firing, 4 seconds Symmetry: R>L, Posterior>Anterior Palpation: R:OI, IC, coccygeus, mild to PR anteriorly, L: IC and coccygeus Prolapse: none   Palpation: TTP to R psoas; illiacus; L OI and piriformis, L psoas and illicaus (R>L)   INTERVENTIONS THIS SESSION: Manual: Performed manual trigger point release to R psoas; illiacus; L psoas and illicaus (R>L). MFR with diaphramgmatic breathing at ER/ER oblique fibers; rib mobility with diaphragmatic breathing to decrease spasm and pain and allow for improved balance of musculature for improved function and decreased symptoms. Additional manual trigger point release to L OI and piriformis, followed my MWM in side-lying (L hip flexion/extension) with grade II/III anterior sacral mobs along L lateral border in order to promote pain free movement when attaining pelvic neutral. (30 minutes)    Self Care: Soda can analogy given with HEP for increased awareness of POC in relationship to her symptoms. Problem solving sleeping positions - pillow between her knees while side sleeping- to reduce reoccurrence of pain. Urge Suppression technique given this session. (8 minutes)   Therex: Piriformis stretch, bilaterally; OI and piriformis more tender on L v R. Hook-lying pelvic tilts- progressed to seated pelvic tilts (with diaphragmatic breathing). Patient required progressively less tactiel feed back as session progressed, along with good self correctionat EOS for diaphragmatic coordination with tilts. (22 minutes)   Total time: 60 min.                               PT Short Term Goals - 11/06/18 0830      PT SHORT TERM GOAL #1   Title  Patient will demonstrate functional  recruitment of TA with breathing, sit-to-stand, squatting/lifting, and walking to allow for improved pelvic brace coordination, improved balance, and decreased downward pressure on the pelvic organs.    Baseline  Pt. describes rectal prolapse on questionnaire    Time  5    Period  Weeks    Status  New    Target Date  12/08/18      PT SHORT TERM GOAL #2   Title  Patient will demonstrate improved pelvic alignment and balance of musculature surrounding the pelvis to facilitate decreased PFM spasms and decrease sacral pain.    Baseline  R up-slip and LLE long    Time  5    Period  Weeks    Status  New    Target Date  12/08/18      PT SHORT TERM GOAL #3   Title  Patient will report a reduction in pain to no greater than 3/10 over the prior week to demonstrate symptom improvement.    Baseline  5/10 max pain  Time  5    Period  Weeks    Status  New    Target Date  12/08/18        PT Long Term Goals - 11/06/18 0835      PT LONG TERM GOAL #1   Title  Patient will report no episodes of SUI/FI over the course of the prior two weeks to demonstrate improved functional ability.    Baseline  Having SUI with cough/sneeze/bend and FI with loose stool as well as gas incontinence.    Time  10    Period  Weeks    Status  New    Target Date  01/12/19      PT LONG TERM GOAL #2   Title  Patient will score at or below 97/300 on the PFDI and 29/300 on the PFIQ to demonstrate a clinically meaningful decrease in disability and distress due to pelvic floor dysfunction.    Baseline  PFDI: 187/300, PFIQ:119/300,    Time  10    Period  Weeks    Status  New    Target Date  01/12/19      PT LONG TERM GOAL #3   Title  Patient will score at or below 25% on the PGQ to demonstrate a clinically significant decrease in disability and improved functional ability.    Baseline  PDI: 33/60 (55%)    Time  10    Period  Weeks    Status  New    Target Date  01/12/19            Plan - 12/15/18 1142     Clinical Impression Statement  Pt. Responded well to all interventions today, demonstrating improved pelvic tilt coordination with diaphragmatic breathing (increased awareness of PFM activation), urge suppression techniques, pain management strategies, as well as understanding and correct performance of all education and exercises provided today. They will continue to benefit from skilled physical therapy to work toward remaining goals and maximize function as well as decrease likelihood of symptom increase or recurrence.    Personal Factors and Comorbidities  Comorbidity 3+    Comorbidities  Osteoporosis, diabetes, Depression    Examination-Activity Limitations  Continence;Toileting;Sit    Examination-Participation Restrictions  Interpersonal Relationship;Community Activity;Shop;Driving    Stability/Clinical Decision Making  Unstable/Unpredictable    Rehab Potential  Good    PT Frequency  1x / week    PT Duration  Other (comment)   10 weeks   PT Treatment/Interventions  ADLs/Self Care Home Management;Biofeedback;Electrical Stimulation;Therapeutic activities;Functional mobility training;Therapeutic exercise;Balance training;Neuromuscular re-education;Orthotic Fit/Training;Manual techniques;Scar mobilization;Taping;Dry needling;Passive range of motion;Spinal Manipulations;Joint Manipulations    PT Next Visit Plan  Next session- pelvic floor cooridnation training for reduction of incontience; *educate on water intake, re-assess gait, address sitting posture at work/lumbar support, give bow-and-arrow, thoracic EXT of towel roll; deep breathing practice review- reassess PSIS alignment- sacral mobs    PT Home Exercise Plan  Standing posture, urge suppression given; diaphragmatic breathing practice; side-stretch, hip-flexor stretch (proposal), seated pelvic tilts, low back stretch; butterfly stretch; figure 4 stretch    Consulted and Agree with Plan of Care  Patient       Patient will benefit from  skilled therapeutic intervention in order to improve the following deficits and impairments:  Decreased balance, Increased muscle spasms, Improper body mechanics, Decreased scar mobility, Decreased activity tolerance, Decreased coordination, Decreased strength, Postural dysfunction, Pain  Visit Diagnosis: Muscle weakness (generalized)  Abnormal posture  Other muscle spasm     Problem List Patient Active  Problem List   Diagnosis Date Noted  . Paroxysmal atrial fibrillation ([place]) 05/10/2017  . Hypothyroidism 02/24/2017  . 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 S/P cardiac catheterization, 08/20/16 minimal CAD  08/21/2016  . Unstable angina ([place])   . DDD (degenerative disc disease), lumbar 06/29/2016  . Meniere's disease 06/29/2016  . Diabetes mellitus, type 2 ([place]) 09/30/2014  . Hyperlipemia 09/30/2014  . Hypertension 09/30/2014  . Hx of adenomatous colonic polyps 05/08/2003    Tomasita Morrow, SPT  12/15/2018, 11:45 AM  [place] MAIN [facility] SERVICES [place], [place], [address] Phone: [phone]   Fax:  [phone]  Name: BRAYLEN DENUNZIO MRN: [id number] Date of Birth: [birth year]

## 2018-12-19 ENCOUNTER — Encounter: Payer: Self-pay | Admitting: Physician Assistant

## 2018-12-22 ENCOUNTER — Ambulatory Visit: Payer: Medicare Other

## 2018-12-22 ENCOUNTER — Other Ambulatory Visit: Payer: Self-pay

## 2018-12-22 DIAGNOSIS — M6281 Muscle weakness (generalized): Secondary | ICD-10-CM | POA: Diagnosis not present

## 2018-12-22 DIAGNOSIS — R293 Abnormal posture: Secondary | ICD-10-CM | POA: Diagnosis not present

## 2018-12-22 DIAGNOSIS — M62838 Other muscle spasm: Secondary | ICD-10-CM | POA: Diagnosis not present

## 2018-12-22 NOTE — Patient Instructions (Addendum)
Education we went over today 12-22-18   1. Water Intake: Continue increasing your water intake, especially when you've had your tea. Alternate tea and water on days you have it. We also suggested cutting your tea intake down by half.   2. At the end of the day: Elevate your feet with a pillow to assist with reducing the small swelling around your ankles. Will also feel good on your low back.    3. Pelvic Tilt With Pelvic Floor (Hook-Lying)        Lie with hips and knees bent. Squeeze pelvic floor and flatten low back while breathing out so that pelvis tilts. Repeat __10_ times. Do _2__ times a day.  Put a pillow under your hips in this position when doing this exercise to help decrease pressure in the pelvis when it feels "heavy". -- This is also an excellent time to try mindfulness. Even prior to attempting a "kegel" or pelvic floor muscle contraction.   4. Seated Belly Breathing:  - perform when stressed or low back begins to hurt- like after you've been sitting. Don't arch your back though.   5. Stair navigation:  - Check in with your posture prior to ascending the stairs. Do a belly breath and exhale and engage your core.  - Tip- lean slightly forward so your torso is in line with the ascending stairs.

## 2018-12-22 NOTE — Therapy (Signed)
[facility] [address] [place], [place], [address] Phone: [phone]   Fax:  [phone]  Physical Therapy Treatment The patient has been informed of current processes in place at Outpatient Rehab to protect patients from Covid-19 exposure including social distancing, schedule modifications, and new cleaning procedures. After discussing their particular risk with a therapist based on the patient's personal risk factors, the patient has decided to proceed with in-person therapy.  Patient Details  Name: Ashley Woodard MRN: [id number] Date of Birth: 01/03/1951 Referring Provider (PT): Stan HeadGessner, Carl   Encounter Date: 12/22/2018  PT End of Session - 12/22/18 1151    Visit Number  6    Number of Visits  10    Date for PT Re-Evaluation  01/12/19    Authorization Type  [employer]    Authorization Time Period  through 01/12/2019    Authorization - Visit Number  6    Authorization - Number of Visits  10    PT Start Time  1030    PT Stop Time  1130    PT Time Calculation (min)  60 min    Activity Tolerance  Patient tolerated treatment well;No increased pain    Behavior During Therapy  WFL for tasks assessed/performed       Past Medical History:  Diagnosis Date  . Abnormal cardiovascular stress test 08/21/2016   a. done for abnl EKG/cardiac risk factors -> admitted for cath 07/2016 which showed no significant CAD, normal LV contraction, mildly elevated filling pressure. Low dose Imdur was added for possible component of microvascular dysfunction.   . Allergy   . Anxiety   . Arthritis    "back, knees, fingers" (05/10/2017)  . Chronic lower back pain   . Colon polyps   . Deafness in right ear   . Depression   . Diverticulosis   . Hx of adenomatous colonic polyps 05/08/2003  . Hypertension   . Hypertriglyceridemia   . Hypothyroidism   . Migraine    "none in the 2000s" (05/10/2017)  . Osteoporosis   . Paroxysmal atrial fibrillation  (HCC)   . Pneumonia    "several times" (05/10/2017)  . PONV (postoperative nausea and vomiting)   . S/P cardiac catheterization, 08/20/16 minimal CAD 08/21/2016  . Type II diabetes mellitus (HCC)   . Voice tremor     Past Surgical History:  Procedure Laterality Date  . ABDOMINAL HYSTERECTOMY  1988  . ATRIAL FIBRILLATION ABLATION N/A 05/10/2017   Procedure: ATRIAL FIBRILLATION ABLATION;  Surgeon: Hillis RangeAllred, James, MD;  [facility]: [facility];  Service: Cardiovascular;  Laterality: N/A;  . COCHLEAR IMPLANT Right 2008  . COLONOSCOPY    . LEFT HEART CATH AND CORONARY ANGIOGRAPHY N/A 08/20/2016   Procedure: Left Heart Cath and Coronary Angiography;  Surgeon: Yvonne KendallEnd, Christopher, MD;  Location: [facility];  Service: Cardiovascular;  Laterality: N/A;  . LOOP RECORDER INSERTION N/A 11/29/2017   Procedure: LOOP RECORDER INSERTION;  Surgeon: Hillis RangeAllred, James, MD;  Location: [facility];  Service: Cardiovascular;  Laterality: N/A;    There were no vitals filed for this visit.     Pelvic Floor Physical Therapy Treatment Note  SCREENING  Changes in medications, allergies, or medical history?: none    SUBJECTIVE  Patient reports: Sitting has been better without pain, as long she watches her posture. Breathing has helped a lot. No accidents this weeks. Just night time less than half a pad of urinary leakage- just a few  nights this week. Has a tendency to drink a lot of water before bed. Has been drinking 36oz of unsweet tea a day- and days she has it she has a lot more urgency. Minor ankle swelling at the end of the day.    Precautions:  Osteoporosis, Deaf in R ear, Depression.  Pain update:  Location of pain: L tailbone area (varies day to day)  Current pain:  2/10  Max pain:  2/10 (with stairs or sitting for long periods of time)  Least pain:  0/10 Nature of pain: dull/achy - decreased pain following session    Patient Goals: Control bowels and flatulence better. Get  rid of pain.   OBJECTIVE  Changes in: Posture/Observations:  R PSIS high   ROM/Flexibility  Limited Thoracic rotation (~15% bilaterally) Limited disassociation between thorax and pelvis.   Palpation: TTP to R QL TTP to B OI TTP to L piriformis and glute med  Limited sacral mobility R>L; flexed coccyx   INTERVENTIONS THIS SESSION: Manual: Performed manual TP release with distraction on R QL, to reduce R up-slip, manual TP release along B OI, L piriformis and glute med. Manual nutation sacral mobs (grade II/III) bilaterally, R>L with MFR along R SIJ for decreased sacral tension or pain. All manual therapy performed to decrease spasm and pain and allow for improved balance of musculature for improved function and decreased symptoms. Assisted nutation with hip extensions in prone performed bilaterally for increased sacral mobility. (30 minutes)    Self Care: Water intake education performed; leg elevation prior to sleeping for decreased ankle edema at end of day and diaphragmatic breathing in hook-lying or seated position for pain management. Mindfulness handout given for pain management and tension relaxation. Arch support information given. (8 minutes)   Gait Training: Stair navigation (with L LE leading) with cues for sequencing to reduce anterior pelvic tilt and increase deep core activation- pt reported less irritation following. (8 minutes)   ThereEx: hook-lying diaphragmatic breathing with Kegel activation (pillow under hips) for improved kegel performance. Patient required external PFM cue. Patient did not demonstrated incontinence promoting sequencing. Side stretch in seated position for QL relaxation following heavy activity level yesterday. (14 minutes)    Total time: 60 min.                               PT Short Term Goals - 12/22/18 1154      PT SHORT TERM GOAL #1   Title  Patient will demonstrate functional recruitment of TA with breathing,  sit-to-stand, squatting/lifting, and walking to allow for improved pelvic brace coordination, improved balance, and decreased downward pressure on the pelvic organs.    Baseline  Pt. describes rectal prolapse on questionnaire    Time  5    Period  Weeks    Status  New    Target Date  12/08/18      PT SHORT TERM GOAL #2   Title  Patient will demonstrate improved pelvic alignment and balance of musculature surrounding the pelvis to facilitate decreased PFM spasms and decrease sacral pain.    Baseline  R up-slip and LLE long    Time  5    Period  Weeks    Status  New    Target Date  12/08/18      PT SHORT TERM GOAL #3   Title  Patient will report a reduction in pain to no greater than 3/10 over the prior  week to demonstrate symptom improvement.    Baseline  5/10 max pain 10-30: demonstrated last week no increase in pain >2/10 on NPS    Time  5    Period  Weeks    Status  On-going    Target Date  12/08/18        PT Long Term Goals - 11/06/18 0835      PT LONG TERM GOAL #1   Title  Patient will report no episodes of SUI/FI over the course of the prior two weeks to demonstrate improved functional ability.    Baseline  Having SUI with cough/sneeze/bend and FI with loose stool as well as gas incontinence.    Time  10    Period  Weeks    Status  New    Target Date  01/12/19      PT LONG TERM GOAL #2   Title  Patient will score at or below 97/300 on the PFDI and 29/300 on the PFIQ to demonstrate a clinically meaningful decrease in disability and distress due to pelvic floor dysfunction.    Baseline  PFDI: 187/300, PFIQ:119/300,    Time  10    Period  Weeks    Status  New    Target Date  01/12/19      PT LONG TERM GOAL #3   Title  Patient will score at or below 25% on the PGQ to demonstrate a clinically significant decrease in disability and improved functional ability.    Baseline  PDI: 33/60 (55%)    Time  10    Period  Weeks    Status  New    Target Date  01/12/19             Plan - 12/22/18 1152    Clinical Impression Statement  Pt. Responded well to all interventions today, demonstrating improved PFM contraction in hook-lying with TA engagement, decreased pain level, improved sacral mobility, improved step up performance without increasing pain, as well as understanding and correct performance of all education and exercises provided today. They will continue to benefit from skilled physical therapy to work toward remaining goals and maximize function as well as decrease likelihood of symptom increase or recurrence.    Personal Factors and Comorbidities  Comorbidity 3+    Comorbidities  Osteoporosis, diabetes, Depression    Examination-Activity Limitations  Continence;Toileting;Sit    Examination-Participation Restrictions  Interpersonal Relationship;Community Activity;Shop;Driving    Stability/Clinical Decision Making  Unstable/Unpredictable    Rehab Potential  Good    PT Frequency  1x / week    PT Duration  Other (comment)   10 weeks   PT Treatment/Interventions  ADLs/Self Care Home Management;Biofeedback;Electrical Stimulation;Therapeutic activities;Functional mobility training;Therapeutic exercise;Balance training;Neuromuscular re-education;Orthotic Fit/Training;Manual techniques;Scar mobilization;Taping;Dry needling;Passive range of motion;Spinal Manipulations;Joint Manipulations    PT Next Visit Plan  reassess PFM coordination;  re-assess gait, address sitting posture at work/lumbar support, give bow-and-arrow, thoracic EXT of towel roll; deep breathing practice review- reassess PSIS alignment- sacral mobs    PT Home Exercise Plan  stair training; water intake edu; kegel in hooklying; Standing posture, urge suppression given; diaphragmatic breathing practice; side-stretch, hip-flexor stretch (proposal), seated pelvic tilts, low back stretch; butterfly stretch; figure 4 stretch    Consulted and Agree with Plan of Care  Patient       Patient will  benefit from skilled therapeutic intervention in order to improve the following deficits and impairments:  Decreased balance, Increased muscle spasms, Improper body mechanics, Decreased scar mobility, Decreased activity tolerance,  Decreased coordination, Decreased strength, Postural dysfunction, Pain  Visit Diagnosis: Muscle weakness (generalized)  Abnormal posture  Other muscle spasm     Problem List Patient Active Problem List   Diagnosis Date Noted  . Paroxysmal atrial fibrillation (HCC) 05/10/2017  . Hypothyroidism 02/24/2017  . 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 S/P cardiac catheterization, 08/20/16 minimal CAD  08/21/2016  . Unstable angina (HCC)   . DDD (degenerative disc disease), lumbar 06/29/2016  . Meniere's disease 06/29/2016  . Diabetes mellitus, type 2 (HCC) 09/30/2014  . Hyperlipemia 09/30/2014  . Hypertension 09/30/2014  . Hx of adenomatous colonic polyps 05/08/2003    Gilford Rile, SPT  12/22/2018, 11:55 AM  [place] [facility] MAIN [facility] SERVICES [address] [place], [place], [address] Phone: [phone]   Fax:  [phone]  Name: Ashley Woodard MRN: [id number] Date of Birth: 1950/07/29

## 2018-12-26 ENCOUNTER — Other Ambulatory Visit: Payer: Self-pay | Admitting: Physician Assistant

## 2018-12-26 ENCOUNTER — Encounter: Payer: Self-pay | Admitting: Emergency Medicine

## 2018-12-29 ENCOUNTER — Ambulatory Visit: Payer: Medicare Other | Attending: Internal Medicine

## 2018-12-29 ENCOUNTER — Ambulatory Visit: Payer: Medicare Other | Admitting: Physician Assistant

## 2018-12-29 ENCOUNTER — Other Ambulatory Visit: Payer: Self-pay

## 2018-12-29 DIAGNOSIS — M62838 Other muscle spasm: Secondary | ICD-10-CM

## 2018-12-29 DIAGNOSIS — R293 Abnormal posture: Secondary | ICD-10-CM | POA: Insufficient documentation

## 2018-12-29 DIAGNOSIS — M6281 Muscle weakness (generalized): Secondary | ICD-10-CM | POA: Diagnosis not present

## 2018-12-29 NOTE — Therapy (Signed)
[facility] [facility] [address] [place], [place], [address] Phone: [phone]   Fax:  [phone]  Physical Therapy Treatment The patient has been informed of current processes in place at Outpatient Rehab to protect patients from Covid-19 exposure including social distancing, schedule modifications, and new cleaning procedures. After discussing their particular risk with a therapist based on the patient's personal risk factors, the patient has decided to proceed with in-person therapy.  Patient Details  Name: Ashley Woodard MRN: [id number] Date of Birth: 04/06/1950 Referring Provider (PT): Stan HeadGessner, Carl   Encounter Date: 12/29/2018  PT End of Session - 12/29/18 1137    Visit Number  7    Number of Visits  10    Date for PT Re-Evaluation  01/12/19    Authorization Type  [employer]    Authorization Time Period  through 01/12/2019    Authorization - Visit Number  7    Authorization - Number of Visits  10    PT Start Time  1025    PT Stop Time  1125    PT Time Calculation (min)  60 min    Activity Tolerance  Patient tolerated treatment well;No increased pain    Behavior During Therapy  WFL for tasks assessed/performed       Past Medical History:  Diagnosis Date  . Abnormal cardiovascular stress test 08/21/2016   a. done for abnl EKG/cardiac risk factors -> admitted for cath 07/2016 which showed no significant CAD, normal LV contraction, mildly elevated filling pressure. Low dose Imdur was added for possible component of microvascular dysfunction.   . Allergy   . Anxiety   . Arthritis    "back, knees, fingers" (05/10/2017)  . Chronic lower back pain   . Colon polyps   . Deafness in right ear   . Depression   . Diverticulosis   . Hx of adenomatous colonic polyps 05/08/2003  . Hypertension   . Hypertriglyceridemia   . Hypothyroidism   . Migraine    "none in the 2000s" (05/10/2017)  . Osteoporosis   . Paroxysmal atrial fibrillation  (HCC)   . Pneumonia    "several times" (05/10/2017)  . PONV (postoperative nausea and vomiting)   . S/P cardiac catheterization, 08/20/16 minimal CAD 08/21/2016  . Type II diabetes mellitus (HCC)   . Voice tremor     Past Surgical History:  Procedure Laterality Date  . ABDOMINAL HYSTERECTOMY  1988  . ATRIAL FIBRILLATION ABLATION N/A 05/10/2017   Procedure: ATRIAL FIBRILLATION ABLATION;  Surgeon: Hillis RangeAllred, James, MD;  [facility];  Service: Cardiovascular;  Laterality: N/A;  . COCHLEAR IMPLANT Right 2008  . COLONOSCOPY    . LEFT HEART CATH AND CORONARY ANGIOGRAPHY N/A 08/20/2016   Procedure: Left Heart Cath and Coronary Angiography;  Surgeon: Yvonne KendallEnd, Christopher, MD;  [facility]: [facility];  Service: Cardiovascular;  Laterality: N/A;  . LOOP RECORDER INSERTION N/A 11/29/2017   Procedure: LOOP RECORDER INSERTION;  Surgeon: Hillis RangeAllred, James, MD;  [facility]: [facility];  Service: Cardiovascular;  Laterality: N/A;    There were no vitals filed for this visit.    Pelvic Floor Physical Therapy Treatment Note  SCREENING  Changes in medications, allergies, or medical history?: none    SUBJECTIVE  Patient reports: Nocturnia has decreased from 2-3x/ night to 1x/night (age normal). And stairs she had no pain. Decreased ankle swelling and tailbone pain with ankles elevated (stool- not above heart level)    Precautions:  Osteoporosis,  Deaf in R ear, Depression.  Pain update:  Location of pain: L tailbone area (varies day to day)  Current pain:  1/10  Max pain:  2/10  Least pain:  0/10 Nature of pain: dull/achy  1-2/ 10 on NPS   Patient Goals: Control bowels and flatulence better. Get rid of pain.   OBJECTIVE  Changes in: Posture/Observations:  Not assessed   ROM/Flexibility  Limited Thoracic rotation (~15% bilaterally)- improved with bow and arrow Limited disassociation between thorax and pelvis. - improved with bow and arrow   Palpation: TTP  to B cervical extension muscles; suboccipitals; UT; STM; scalenes  Limited sacral mobility R>L; flexed coccyx   INTERVENTIONS THIS SESSION: Manual: Performed manual TP release with distraction on cervical extensor musculatures, suboccipitals, scalenes and UT to decrease spasm and pain and allow for improved balance of musculature for improved function and decreased symptoms due forward head posture and increased pain with bow and arrow. Reassess bow and arrow following manual therapy and no pain occurred. Moist heat applied during therex following manual therapy.  (30 minutes)   ThereEx: B bridges progressed to red t-and for increasing hip ABD activation for improve hip stabilization. Cues for exhalation during concentric movement. Dorothy's performed- not added to HEP - improved coccyx pain. Chin tucks in hook-lying with cues for improved anterior neck flexors, elicited "swimming sensation" decreased following manual therapy to cervical extensors. Bow and arrow stretches, bilaterally with cues for decreased shoulder elevation- improved thoracic rotation and thoracolumbar dissociation. (30 minutes)    Total time: 60 min.        PT Short Term Goals - 12/22/18 1154      PT SHORT TERM GOAL #1   Title  Patient will demonstrate functional recruitment of TA with breathing, sit-to-stand, squatting/lifting, and walking to allow for improved pelvic brace coordination, improved balance, and decreased downward pressure on the pelvic organs.    Baseline  Pt. describes rectal prolapse on questionnaire    Time  5    Period  Weeks    Status  New    Target Date  12/08/18      PT SHORT TERM GOAL #2   Title  Patient will demonstrate improved pelvic alignment and balance of musculature surrounding the pelvis to facilitate decreased PFM spasms and decrease sacral pain.    Baseline  R up-slip and LLE long    Time  5    Period  Weeks    Status  New    Target Date  12/08/18      PT SHORT TERM GOAL #3    Title  Patient will report a reduction in pain to no greater than 3/10 over the prior week to demonstrate symptom improvement.    Baseline  5/10 max pain 10-30: demonstrated last week no increase in pain >2/10 on NPS    Time  5    Period  Weeks    Status  Achieved    Target Date  12/08/18        PT Long Term Goals - 12/22/18 1200      PT LONG TERM GOAL #1   Title  Patient will report no episodes of SUI/FI over the course of the prior two weeks to demonstrate improved functional ability.    Baseline  Having SUI with cough/sneeze/bend and FI with loose stool as well as gas incontinence.    Time  10    Period  Weeks    Status  New      PT  LONG TERM GOAL #2   Title  Patient will score at or below 97/300 on the PFDI and 29/300 on the PFIQ to demonstrate a clinically meaningful decrease in disability and distress due to pelvic floor dysfunction.    Baseline  PFDI: 187/300, PFIQ:119/300,    Time  10    Period  Weeks    Status  New      PT LONG TERM GOAL #3   Title  Patient will score at or below 25% on the [facility] to demonstrate a clinically significant decrease in disability and improved functional ability.    Baseline  PDI: 33/60 (55%)    Time  10    Period  Weeks    Status  New    Target Date  01/12/19            Plan - 12/29/18 1137    Clinical Impression Statement  Pt. Responded well to all interventions today, demonstrating improved thoracic lumbar mobility, decreased cervical tension and improved hip stabilization with movement, as well as understanding and correct performance of all education and exercises provided today. They will continue to benefit from skilled physical therapy to work toward remaining goals and maximize function as well as decrease likelihood of symptom increase or recurrence.    Personal Factors and Comorbidities  Comorbidity 3+    Comorbidities  Osteoporosis, diabetes, Depression    Examination-Activity Limitations  Continence;Toileting;Sit     Examination-Participation Restrictions  Interpersonal Relationship;Community Activity;Shop;Driving    Stability/Clinical Decision Making  Unstable/Unpredictable    Rehab Potential  Good    PT Frequency  1x / week    PT Duration  Other (comment)   10 weeks   PT Treatment/Interventions  ADLs/Self Care Home Management;Biofeedback;Electrical Stimulation;Therapeutic activities;Functional mobility training;Therapeutic exercise;Balance training;Neuromuscular re-education;Orthotic Fit/Training;Manual techniques;Scar mobilization;Taping;Dry needling;Passive range of motion;Spinal Manipulations;Joint Manipulations    PT Next Visit Plan  reassess PFM coordination;  re-assess gait, address sitting posture at work/lumbar support, thoracic EXT of towel roll; deep breathing practice review- reassess PSIS alignment- sacral mobs; asssess dorthy's again- post exercises soreness?    PT Home Exercise Plan  stair training; water intake edu; kegel in hooklying; Standing posture, urge suppression given; diaphragmatic breathing practice; side-stretch, hip-flexor stretch (proposal), seated pelvic tilts, low back stretch; butterfly stretch; figure 4 stretch; bow and arrow; chin tucks in hooklying and bridges    Consulted and Agree with Plan of Care  Patient       Patient will benefit from skilled therapeutic intervention in order to improve the following deficits and impairments:  Decreased balance, Increased muscle spasms, Improper body mechanics, Decreased scar mobility, Decreased activity tolerance, Decreased coordination, Decreased strength, Postural dysfunction, Pain  Visit Diagnosis: Muscle weakness (generalized)  Abnormal posture  Other muscle spasm     Problem List Patient Active Problem List   Diagnosis Date Noted  . Paroxysmal atrial fibrillation (HCC) 05/10/2017  . Hypothyroidism 02/24/2017  . 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 S/P cardiac catheterization, 08/20/16 minimal CAD  08/21/2016  . Unstable  angina (HCC)   . DDD (degenerative disc disease), lumbar 06/29/2016  . Meniere's disease 06/29/2016  . Diabetes mellitus, type 2 (HCC) 09/30/2014  . Hyperlipemia 09/30/2014  . Hypertension 09/30/2014  . Hx of adenomatous colonic polyps 05/08/2003   Gilford Rile, SPT  Shante Archambeault 12/29/2018, 11:44 AM  [place] [facility] MAIN [facility] SERVICES [address] [place], [place], [address] Phone: [phone]   Fax:  [phone]  Name: Ashley Woodard MRN: [id number] Date of Birth:  03/28/1950   

## 2018-12-29 NOTE — Patient Instructions (Addendum)
"  Pre-squeeze and sneeze"  Before you cough, sneeze, laugh etc. "pre-squeeze" the pelvic floor (kegel) and hold it until you finish coughing to retrain the muscles to hold the urine (or gas) in during these activities. Or at least tucking the tail bone under.    Bridges  Red thereband around your knees. 2 sets of 10  - for hip stabilizing musculature  - breath out while lifting hips up. Slow and controlled rolling back back down.  - smooth, slow and controlled motion  - keeping hip and feet facing forward (not collapsing knees in with red theraband).       Let the top arm rest on your side, and slide along the torso as you rotate. Breathe in as you come forward, out as you open up. Do 2x15 on each side.    Chin tucks- while laying down for neck stability-goal is less tension in upper shoulders/low neck  While laying down 2 sets of 10, exhale or breath out on the hard part.

## 2019-01-01 ENCOUNTER — Ambulatory Visit (INDEPENDENT_AMBULATORY_CARE_PROVIDER_SITE_OTHER): Payer: Medicare Other | Admitting: *Deleted

## 2019-01-01 DIAGNOSIS — I480 Paroxysmal atrial fibrillation: Secondary | ICD-10-CM

## 2019-01-02 LAB — CUP PACEART REMOTE DEVICE CHECK
Date Time Interrogation Session: 20201107164407
Implantable Pulse Generator Implant Date: 20191008

## 2019-01-05 ENCOUNTER — Other Ambulatory Visit: Payer: Self-pay

## 2019-01-05 ENCOUNTER — Ambulatory Visit: Payer: Medicare Other

## 2019-01-05 ENCOUNTER — Ambulatory Visit (INDEPENDENT_AMBULATORY_CARE_PROVIDER_SITE_OTHER): Payer: Medicare Other | Admitting: Physician Assistant

## 2019-01-05 ENCOUNTER — Encounter: Payer: Self-pay | Admitting: Physician Assistant

## 2019-01-05 VITALS — BP 136/70 | HR 76 | Temp 98.3°F | Resp 14 | Ht 67.0 in | Wt 207.0 lb

## 2019-01-05 DIAGNOSIS — E039 Hypothyroidism, unspecified: Secondary | ICD-10-CM | POA: Diagnosis not present

## 2019-01-05 DIAGNOSIS — M62838 Other muscle spasm: Secondary | ICD-10-CM

## 2019-01-05 DIAGNOSIS — J3489 Other specified disorders of nose and nasal sinuses: Secondary | ICD-10-CM

## 2019-01-05 DIAGNOSIS — Z23 Encounter for immunization: Secondary | ICD-10-CM

## 2019-01-05 DIAGNOSIS — E119 Type 2 diabetes mellitus without complications: Secondary | ICD-10-CM

## 2019-01-05 DIAGNOSIS — I10 Essential (primary) hypertension: Secondary | ICD-10-CM

## 2019-01-05 DIAGNOSIS — E785 Hyperlipidemia, unspecified: Secondary | ICD-10-CM | POA: Diagnosis not present

## 2019-01-05 DIAGNOSIS — R293 Abnormal posture: Secondary | ICD-10-CM

## 2019-01-05 DIAGNOSIS — E538 Deficiency of other specified B group vitamins: Secondary | ICD-10-CM | POA: Diagnosis not present

## 2019-01-05 DIAGNOSIS — M6281 Muscle weakness (generalized): Secondary | ICD-10-CM

## 2019-01-05 DIAGNOSIS — F489 Nonpsychotic mental disorder, unspecified: Secondary | ICD-10-CM

## 2019-01-05 DIAGNOSIS — Z0000 Encounter for general adult medical examination without abnormal findings: Secondary | ICD-10-CM | POA: Diagnosis not present

## 2019-01-05 NOTE — Therapy (Signed)
[facility] MAIN [facility] SERVICES [address] [place], [place], [address] Phone: [phone]   Fax:  [phone]  Physical Therapy Treatment The patient has been informed of current processes in place at Outpatient Rehab to protect patients from Covid-19 exposure including social distancing, schedule modifications, and new cleaning procedures. After discussing their particular risk with a therapist based on the patient's personal risk factors, the patient has decided to proceed with in-person therapy.  Patient Details  Name: Ashley Woodard MRN: [id number] Date of Birth: 11-12-1950 Referring Provider (PT): Stan Head   Encounter Date: 01/05/2019  PT End of Session - 01/05/19 1148    Visit Number  8    Number of Visits  10    Date for PT Re-Evaluation  01/12/19    Authorization Type  [employer]    Authorization Time Period  through 01/12/2019    Authorization - Visit Number  8    Authorization - Number of Visits  10    PT Start Time  1030    PT Stop Time  1135    PT Time Calculation (min)  65 min    Activity Tolerance  Patient tolerated treatment well;No increased pain    Behavior During Therapy  WFL for tasks assessed/performed       Past Medical History:  Diagnosis Date  . Abnormal cardiovascular stress test 08/21/2016   a. done for abnl EKG/cardiac risk factors -> admitted for cath 07/2016 which showed no significant CAD, normal LV contraction, mildly elevated filling pressure. Low dose Imdur was added for possible component of microvascular dysfunction.   . Allergy   . Anxiety   . Arthritis    "back, knees, fingers" (05/10/2017)  . Chronic lower back pain   . Colon polyps   . Deafness in right ear   . Depression   . Diverticulosis   . Hx of adenomatous colonic polyps 05/08/2003  . Hypertension   . Hypertriglyceridemia   . Hypothyroidism   . Migraine    "none in the 2000s" (05/10/2017)  . Osteoporosis   . Paroxysmal atrial fibrillation  (HCC)   . Pneumonia    "several times" (05/10/2017)  . PONV (postoperative nausea and vomiting)   . S/P cardiac catheterization, 08/20/16 minimal CAD 08/21/2016  . Type II diabetes mellitus (HCC)   . Voice tremor     Past Surgical History:  Procedure Laterality Date  . ABDOMINAL HYSTERECTOMY  1988  . ATRIAL FIBRILLATION ABLATION N/A 05/10/2017   Procedure: ATRIAL FIBRILLATION ABLATION;  Surgeon: Hillis Range, MD;  Location: [facility] INVASIVE CV LAB;  Service: Cardiovascular;  Laterality: N/A;  . COCHLEAR IMPLANT Right 2008  . COLONOSCOPY    . LEFT HEART CATH AND CORONARY ANGIOGRAPHY N/A 08/20/2016   Procedure: Left Heart Cath and Coronary Angiography;  Surgeon: Yvonne Kendall, MD;  Location: [facility] INVASIVE CV LAB;  Service: Cardiovascular;  Laterality: N/A;  . LOOP RECORDER INSERTION N/A 11/29/2017   Procedure: LOOP RECORDER INSERTION;  Surgeon: Hillis Range, MD;  Location: [facility] INVASIVE CV LAB;  Service: Cardiovascular;  Laterality: N/A;    There were no vitals filed for this visit.   Pelvic Floor Physical Therapy Treatment Note  SCREENING  Changes in medications, allergies, or medical history?: none    SUBJECTIVE  Patient reports: Has been using a donut pillow while sitting at home. Did not have residual strength soreness from HEP progress this week. Nocurtinia has decreased to 1-2x/ night. Tea is a trigger. Goal would be to get off  tea. Trailed elevated the ankles at the night, helped back. Reduced going to the chiropractor. Difficulty with seated posterior pelvic tilts.    Precautions:  Osteoporosis, Deaf in R ear, Depression.  Pain update:  Location of pain: L tailbone area (varies day to day)  Current pain:  1-2/10  Max pain:  3/10 - only at home sitting for a longer period of time  Least pain:  0/10 Nature of pain: dull/achy  2-3/10 on NPS with posterior pelvic tilts; 0/10 on NPS at rest.    Patient Goals: Control bowels and flatulence better. Get rid of  pain.   OBJECTIVE  Changes in: Posture/Observations:  Not assessed   ROM/Flexibility    Palpation: TTP L L5 paraspinals; TTP to L piriformis, TTP to L psoas   INTERVENTIONS THIS SESSION: Manual: TP release at L piriformis, L5 paraspinals, L psoas to decrease spasm and pain and allow for improved balance of musculature for improved function and decreased symptoms. L sacral mobility with movemtn (hip knee flexion, extension) to improve sacroiliac mobility and decreased sacral pain. (30 minutes)   ThereEx: B bridges progressed to red t-and for increasing hip ABD activation for improve hip stabilization. Cues for exhalation during concentric movement. Dorothy's performed, improved coccyx pain, cues to reduce knee extension- decreased hip pain with pillow under hips. Hook-lying posterior pelvic tilts (pillow under hips)- cues for full relaxation during inhalation. Progressed to seated posterior pelvic tilts with tactile cues at illiacus- reduced cues by end of session  (30 minutes)   Self Care: Self Care- reducing donut pillow use, adding pillow behind back for seated posture at home. (5 minutes)    Total time: 65 min.                           PT Short Term Goals - 12/22/18 1154      PT SHORT TERM GOAL #1   Title  Patient will demonstrate functional recruitment of TA with breathing, sit-to-stand, squatting/lifting, and walking to allow for improved pelvic brace coordination, improved balance, and decreased downward pressure on the pelvic organs.    Baseline  Pt. describes rectal prolapse on questionnaire    Time  5    Period  Weeks    Status  New    Target Date  12/08/18      PT SHORT TERM GOAL #2   Title  Patient will demonstrate improved pelvic alignment and balance of musculature surrounding the pelvis to facilitate decreased PFM spasms and decrease sacral pain.    Baseline  R up-slip and LLE long    Time  5    Period  Weeks    Status  New    Target  Date  12/08/18      PT SHORT TERM GOAL #3   Title  Patient will report a reduction in pain to no greater than 3/10 over the prior week to demonstrate symptom improvement.    Baseline  5/10 max pain 10-30: demonstrated last week no increase in pain >2/10 on NPS    Time  5    Period  Weeks    Status  Achieved    Target Date  12/08/18        PT Long Term Goals - 12/22/18 1200      PT LONG TERM GOAL #1   Title  Patient will report no episodes of SUI/FI over the course of the prior two weeks to demonstrate improved functional ability.  Baseline  Having SUI with cough/sneeze/bend and FI with loose stool as well as gas incontinence.    Time  10    Period  Weeks    Status  New      PT LONG TERM GOAL #2   Title  Patient will score at or below 97/300 on the PFDI and 29/300 on the PFIQ to demonstrate a clinically meaningful decrease in disability and distress due to pelvic floor dysfunction.    Baseline  PFDI: 187/300, PFIQ:119/300,    Time  10    Period  Weeks    Status  New      PT LONG TERM GOAL #3   Title  Patient will score at or below 25[facility] the [facility] to demonstrate a clinically significant decrease in disability and improved functional ability.    Baseline  PDI: 33/60 (55%)    Time  10    Period  Weeks    Status  New    Target Date  01/12/19            Plan - 01/05/19 1148    Clinical Impression Statement  Pt. Responded well to all interventions today, demonstrating improved posterior pelvic tilt activation, decrease pain level at rest, as well as understanding and correct performance of all education and exercises provided today. They will continue to benefit from skilled physical therapy to work toward remaining goals and maximize function as well as decrease likelihood of symptom increase or recurrence.    Personal Factors and Comorbidities  Comorbidity 3+    Comorbidities  Osteoporosis, diabetes, Depression    Examination-Activity Limitations  Continence;Toileting;Sit     Examination-Participation Restrictions  Interpersonal Relationship;Community Activity;Shop;Driving    Stability/Clinical Decision Making  Unstable/Unpredictable    Rehab Potential  Good    PT Frequency  1x / week    PT Duration  Other (comment)   10 weeks   PT Treatment/Interventions  ADLs/Self Care Home Management;Biofeedback;Electrical Stimulation;Therapeutic activities;Functional mobility training;Therapeutic exercise;Balance training;Neuromuscular re-education;Orthotic Fit/Training;Manual techniques;Scar mobilization;Taping;Dry needling;Passive range of motion;Spinal Manipulations;Joint Manipulations    PT Next Visit Plan  reassess PFM coordination/ TP release to posterior muscles;  re-assess gait, address sitting posture at work/lumbar support, thoracic EXT of towel roll; deep breathing practice review- reassess PSIS alignment- sacral mobs; asssess dorthy's again- post exercises soreness?    PT Home Exercise Plan  stair training; water intake edu; kegel in hooklying; Standing posture, urge suppression given; diaphragmatic breathing practice; side-stretch, hip-flexor stretch (proposal), seated pelvic tilts, low back stretch; butterfly stretch; figure 4 stretch; bow and arrow; chin tucks in hooklying; bridges; Dorthy's; seated posterior pelvic tils    Consulted and Agree with Plan of Care  Patient       Patient will benefit from skilled therapeutic intervention in order to improve the following deficits and impairments:  Decreased balance, Increased muscle spasms, Improper body mechanics, Decreased scar mobility, Decreased activity tolerance, Decreased coordination, Decreased strength, Postural dysfunction, Pain  Visit Diagnosis: Muscle weakness (generalized)  Abnormal posture  Other muscle spasm     Problem List Patient Active Problem List   Diagnosis Date Noted  . Paroxysmal atrial fibrillation (HCC) 05/10/2017  . Hypothyroidism 02/24/2017  . 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  S/P cardiac catheterization, 08/20/16 minimal CAD  08/21/2016  . Unstable angina (HCC)   . DDD (degenerative disc disease), lumbar 06/29/2016  . Meniere's disease 06/29/2016  . Diabetes mellitus, type 2 (HCC) 09/30/2014  . Hyperlipemia 09/30/2014  . Hypertension 09/30/2014  . Hx of adenomatous colonic polyps  05/08/2003   Kamilla Hands, SPT  Lataysha Vohra 01/05/2019, 11:51 AM  [place] MAIN [facility] SERVICES [address] [place], [place], [address] Phone: [phone]   Fax:  [phone]  Name: Ashley Woodard MRN: [id number] Date of Birth: [birth year]

## 2019-01-05 NOTE — Patient Instructions (Addendum)
Please go to the lab for blood work.   -Our office will call you with your results unless you have chosen to receive results via MyChart. -If your blood work is normal we will follow-up each year for physicals and as scheduled for chronic medical problems. -If anything is abnormal we will treat accordingly and get you in for a follow-up.  -Please work on stress relief -- try to go for a little walk (10-15 minutes) daily if able. Also look into some home yoga or tai chi practices to help alleviate tension in the muscles.   -Restart the Flonase daily. Also start a nightly saline nasal rinse. Restart Claritin or Xyzal over-the-counter. Keep hydrated. Let me know if nasal drainage and AM congestion are not improving in the next week or so.    Preventive Care 67 Years and Older, Female Preventive care refers to lifestyle choices and visits with your health care provider that can promote health and wellness. This includes:  A yearly physical exam. This is also called an annual well check.  Regular dental and eye exams.  Immunizations.  Screening for certain conditions.  Healthy lifestyle choices, such as diet and exercise. What can I expect for my preventive care visit? Physical exam Your health care provider will check:  Height and weight. These may be used to calculate body mass index (BMI), which is a measurement that tells if you are at a healthy weight.  Heart rate and blood pressure.  Your skin for abnormal spots. Counseling Your health care provider may ask you questions about:  Alcohol, tobacco, and drug use.  Emotional well-being.  Home and relationship well-being.  Sexual activity.  Eating habits.  History of falls.  Memory and ability to understand (cognition).  Work and work Statistician.  Pregnancy and menstrual history. What immunizations do I need?  Influenza (flu) vaccine  This is recommended every year. Tetanus, diphtheria, and pertussis (Tdap)  vaccine  You may need a Td booster every 10 years. Varicella (chickenpox) vaccine  You may need this vaccine if you have not already been vaccinated. Zoster (shingles) vaccine  You may need this after age 67. Pneumococcal conjugate (PCV13) vaccine  One dose is recommended after age 67. Pneumococcal polysaccharide (PPSV23) vaccine  One dose is recommended after age 67. Measles, mumps, and rubella (MMR) vaccine  You may need at least one dose of MMR if you were born in 1957 or later. You may also need a second dose. Meningococcal conjugate (MenACWY) vaccine  You may need this if you have certain conditions. Hepatitis A vaccine  You may need this if you have certain conditions or if you travel or work in places where you may be exposed to hepatitis A. Hepatitis B vaccine  You may need this if you have certain conditions or if you travel or work in places where you may be exposed to hepatitis B. Haemophilus influenzae type b (Hib) vaccine  You may need this if you have certain conditions. You may receive vaccines as individual doses or as more than one vaccine together in one shot (combination vaccines). Talk with your health care provider about the risks and benefits of combination vaccines. What tests do I need? Blood tests  Lipid and cholesterol levels. These may be checked every 5 years, or more frequently depending on your overall health.  Hepatitis C test.  Hepatitis B test. Screening  Lung cancer screening. You may have this screening every year starting at age 20 if you have a 30-pack-year  history of smoking and currently smoke or have quit within the past 15 years.  Colorectal cancer screening. All adults should have this screening starting at age 70 and continuing until age 67. Your health care provider may recommend screening at age 37 if you are at increased risk. You will have tests every 1-10 years, depending on your results and the type of screening  test.  Diabetes screening. This is done by checking your blood sugar (glucose) after you have not eaten for a while (fasting). You may have this done every 1-3 years.  Mammogram. This may be done every 1-2 years. Talk with your health care provider about how often you should have regular mammograms.  BRCA-related cancer screening. This may be done if you have a family history of breast, ovarian, tubal, or peritoneal cancers. Other tests  Sexually transmitted disease (STD) testing.  Bone density scan. This is done to screen for osteoporosis. You may have this done starting at age 67. Follow these instructions at home: Eating and drinking  Eat a diet that includes fresh fruits and vegetables, whole grains, lean protein, and low-fat dairy products. Limit your intake of foods with high amounts of sugar, saturated fats, and salt.  Take vitamin and mineral supplements as recommended by your health care provider.  Do not drink alcohol if your health care provider tells you not to drink.  If you drink alcohol: ? Limit how much you have to 0-1 drink a day. ? Be aware of how much alcohol is in your drink. In the [place] one drink equals one 12 oz bottle of beer (355 mL), one 5 oz glass of wine (148 mL), or one 1 oz glass of hard liquor (44 mL). Lifestyle  Take daily care of your teeth and gums.  Stay active. Exercise for at least 30 minutes on 5 or more days each week.  Do not use any products that contain nicotine or tobacco, such as cigarettes, e-cigarettes, and chewing tobacco. If you need help quitting, ask your health care provider.  If you are sexually active, practice safe sex. Use a condom or other form of protection in order to prevent STIs (sexually transmitted infections).  Talk with your health care provider about taking a low-dose aspirin or statin. What's next?  Go to your health care provider once a year for a well check visit.  Ask your health care provider how often you  should have your eyes and teeth checked.  Stay up to date on all vaccines. This information is not intended to replace advice given to you by your health care provider. Make sure you discuss any questions you have with your health care provider. Document Released: 03/07/2015 Document Revised: 02/02/2018 Document Reviewed: 02/02/2018 Elsevier Patient Education  2020 [employer].

## 2019-01-05 NOTE — Patient Instructions (Addendum)
Tips and Tricks  - add a throw pillow behind you while you sit at home, to fill in the small curve in your low back to help prop your back up. (like the photo below)    - trialling using stretches and adjusting posture at home before using donut pillow.   Seated Pelvic Tilts:    Do 2 sets of 15 tilts per day. Breathe in when you tilt forward (A) and out when you tuck under (B).  Tips - set a timer during the day- to do this 3-4x a day or after being in a seated position for more than an hour.   Dorthy's:    Bring knees wide, heels together and toes apart while lying on your stomach. Place a small pillow under your hips/belly for comfort. Gently squeeze the heels together and hold for ~ 1-2 sec. Repeat 10 times, 1 time per day.  Tips- work with your breaths, as your breath in relax.Start to breath out then begin squeezing heels together. This is a small movement! If toes start going back towards the floor rest.    For Bridges  - add red theraband around the knees and keep knees in line with hip.

## 2019-01-05 NOTE — Progress Notes (Signed)
Subjective:   Ashley Woodard is a 68 y.o. female who presents for Medicare Annual (Subsequent) preventive examination.  Review of Systems:   Notes sinus drainage with productive cough in the morning over the past 6 weeks. Was initially clear sputum, now is green-yellow. Also notes sinus pressure, headache, and ear itching. Will occasionally use Flonase when symptoms worsen which provides some relief. Denies chest congestion, SOB, difficulty breathing, itchy eyes, sore throat, pain with swallowing.   Currently in PT for pelvic floor therapy, was noted to have very tensed mucles. Describes herself as always being an "anxious" person. Denies panic attacks, irritability, changes in mood. Ongoing insomnia, takes Unisom nightly to help with sleep, notes getting 7 hours of sleep per night. Notes if she does not take it she has difficulty falling asleep and difficulty staying asleep.   Objective:     Vitals: BP 136/70   Pulse 76   Temp 98.3 F (36.8 C) (Temporal)   Resp 14   Ht _0  (1.702 m)   Wt 207 lb (93.9 kg)   SpO2 98%   BMI 32.42 kg/m   Body mass index is 32.42 kg/m.  Advanced Directives 01/05/2019 11/03/2018 05/10/2017 05/10/2017 10/11/2016 08/19/2016 08/19/2016  Does Patient Have a Medical Advance Directive? Yes Yes - Yes Yes Yes Yes  Type of Advance Directive [place];Living will Living will;Healthcare Power of [place];Living will [place];Living will - [place];Living will [place];Living will  Does patient want to make changes to medical advance directive? - - - No - Patient declined No - Patient declined No - Patient declined -  Copy of [place] in Chart? No - copy requested No - copy requested No - copy requested No - copy requested - No - copy requested No - copy requested    Tobacco Social History   Tobacco Use  Smoking Status Never Smoker   Smokeless Tobacco Never Used     Counseling given: Yes   Clinical Intake:  Pre-visit preparation completed: No  Pain : No/denies pain     BMI - recorded: 32.42 Nutritional Status: BMI > 30  Obese Diabetes: Yes CBG done?: No Did pt. bring in CBG monitor from home?: No  How often do you need to have someone help you when you read instructions, pamphlets, or other written materials from your doctor or pharmacy?: 1 - Never  Interpreter Needed?: No     Past Medical History:  Diagnosis Date  . Abnormal cardiovascular stress test 08/21/2016   a. done for abnl EKG/cardiac risk factors -> admitted for cath 07/2016 which showed no significant CAD, normal LV contraction, mildly elevated filling pressure. Low dose Imdur was added for possible component of microvascular dysfunction.   . Allergy   . Anxiety   . Arthritis    "back, knees, fingers" (05/10/2017)  . Chronic lower back pain   . Colon polyps   . Deafness in right ear   . Depression   . Diverticulosis   . Hx of adenomatous colonic polyps 05/08/2003  . Hypertension   . Hypertriglyceridemia   . Hypothyroidism   . Migraine    "none in the 2000s" (05/10/2017)  . Osteoporosis   . Paroxysmal atrial fibrillation (HCC)   . Pneumonia    "several times" (05/10/2017)  . PONV (postoperative nausea and vomiting)   . S/P cardiac catheterization, 08/20/16 minimal CAD 08/21/2016  . Type II diabetes mellitus ([place])   .  Voice tremor    Past Surgical History:  Procedure Laterality Date  . ABDOMINAL HYSTERECTOMY  1988  . ATRIAL FIBRILLATION ABLATION N/A 05/10/2017   Procedure: ATRIAL FIBRILLATION ABLATION;  Surgeon: Thompson Grayer, MD;  Location: [facility];  Service: Cardiovascular;  Laterality: N/A;  . COCHLEAR IMPLANT Right 2008  . COLONOSCOPY    . LEFT HEART CATH AND CORONARY ANGIOGRAPHY N/A 08/20/2016   Procedure: Left Heart Cath and Coronary Angiography;  Surgeon: Nelva Bush, MD;  Location: [facility];   Service: Cardiovascular;  Laterality: N/A;  . LOOP RECORDER INSERTION N/A 11/29/2017   Procedure: LOOP RECORDER INSERTION;  Surgeon: Thompson Grayer, MD;  Location: [facility];  Service: Cardiovascular;  Laterality: N/A;   Family History  Problem Relation Age of Onset  . Hypertension Mother   . Diabetes Mother   . Heart attack Mother   . Colon polyps Mother   . Hypertension Father   . Heart attack Father   . Stroke Father   . Breast cancer Sister 45  . Hypertension Brother   . Cancer Brother        Prostate  . Diabetes Brother   . Dementia Brother   . Stroke Brother   . Heart attack Brother   . Prostate cancer Brother   . Hypertension Son   . Breast cancer Maternal Aunt        in 40's  . Cancer Maternal Uncle        Lung  . Colon cancer Neg Hx   . Esophageal cancer Neg Hx   . Rectal cancer Neg Hx   . Stomach cancer Neg Hx    Social History   Socioeconomic History  . Marital status: Married    Spouse name: Not on file  . Number of children: 2  . Years of education: 39  . Highest education level: Not on file  Occupational History  . Occupation: Health and safety inspector  Social Needs  . Financial resource strain: Not on file  . Food insecurity    Worry: Not on file    Inability: Not on file  . Transportation needs    Medical: Not on file    Non-medical: Not on file  Tobacco Use  . Smoking status: Never Smoker  . Smokeless tobacco: Never Used  Substance and Sexual Activity  . Alcohol use: No  . Drug use: No  . Sexual activity: Not Currently  Lifestyle  . Physical activity    Days per week: Not on file    Minutes per session: Not on file  . Stress: Not on file  Relationships  . Social Herbalist on phone: Not on file    Gets together: Not on file    Attends religious service: Not on file    Active member of club or organization: Not on file    Attends meetings of clubs or organizations: Not on file    Relationship status: Not on file  Other  Topics Concern  . Not on file  Social History Narrative   Lives at home with husband in [place].   Right-handed.   No caffeine use.   Manages a call center    Outpatient Encounter Medications as of 01/05/2019  Medication Sig  . amLODipine (NORVASC) 10 MG tablet TAKE 1 TABLET DAILY  . Cholecalciferol (VITAMIN D) 2000 units CAPS Take 1 capsule by mouth daily.   Marland Kitchen doxylamine, Sleep, (SLEEP AID) 25 MG tablet Take 25 mg by mouth  at bedtime.  . DULoxetine (CYMBALTA) 30 MG capsule TAKE 1 CAPSULE DAILY  . glucose blood (CONTOUR NEXT TEST) test strip Check blood sugars once daily  . hydrALAZINE (APRESOLINE) 50 MG tablet TAKE 1 TABLET BY MOUTH TWICE A DAY  . hydrochlorothiazide (HYDRODIURIL) 25 MG tablet TAKE 1 TABLET DAILY  . irbesartan (AVAPRO) 150 MG tablet TAKE 1 TABLET DAILY  . KLOR-CON M20 20 MEQ tablet TAKE 1 TABLET TWICE A DAY  . LANCETS ULTRA THIN 30G MISC 1 Stick by Does not apply route daily.  Marland Kitchen levothyroxine (SYNTHROID) 75 MCG tablet TAKE 1 TABLET DAILY  . Magnesium 200 MG TABS Take 200 mg by mouth daily.   . metFORMIN (GLUCOPHAGE) 500 MG tablet TAKE 1 TABLET TWICE DAILY  WITH MEALS  . Omega-3 Fatty Acids (FISH OIL) 1000 MG CAPS Take 1,000 mg by mouth daily.   . simvastatin (ZOCOR) 20 MG tablet Take 1 tablet (20 mg total) by mouth 3 (three) times a week. Monday, Wednesday, Friday  . vitamin B-12 (CYANOCOBALAMIN) 1000 MCG tablet Take 1 tablet (1,000 mcg total) by mouth daily.   Facility-Administered Encounter Medications as of 01/05/2019  Medication  . 0.9 %  sodium chloride infusion    Activities of Daily Living In your present state of health, do you have any difficulty performing the following activities: 01/05/2019  Hearing? N  Vision? N  Difficulty concentrating or making decisions? N  Walking or climbing stairs? N  Dressing or bathing? N  Doing errands, shopping? N  Preparing Food and eating ? N  Using the Toilet? N  In the past six months, have you accidently leaked  urine? N  Do you have problems with loss of bowel control? N  Managing your Medications? N  Managing your Finances? N  Housekeeping or managing your Housekeeping? N  Some recent data might be hidden    Patient Care Team: Delorse Limber as PCP - General (Family Medicine) Thompson Grayer, MD as PCP - Electrophysiology (Cardiology) Exie Parody, OD as Physician Assistant (Optometry) Melida Quitter, MD as Consulting Physician (Otolaryngology) Sueanne Margarita, MD as Consulting Physician (Cardiology) Exie Parody, OD as Physician Assistant (Optometry)    Assessment:   This is a routine wellness examination for [place].  Exercise Activities and Dietary recommendations Current Exercise Habits: The patient does not participate in regular exercise at present  Goals   None     Fall Risk Fall Risk  01/05/2019 01/05/2019 12/23/2017 10/11/2016 06/29/2016  Falls in the past year? 0 0 0 No No  Number falls in past yr: 0 0 0 - -  Injury with Fall? - 0 0 - -  Follow up - Falls evaluation completed Falls evaluation completed - -   Is the patient's home free of loose throw rugs in walkways, pet beds, electrical cords, etc?   yes      Grab bars in the bathroom? yes      Handrails on the stairs?   yes      Adequate lighting?   yes   Depression Screen PHQ 2/9 Scores 01/05/2019 12/23/2017 10/11/2016 08/03/2016  PHQ - 2 Score 0 0 0 0  PHQ- 9 Score - 0 - 0     Cognitive Function MMSE - Mini Mental State Exam 01/05/2019  Orientation to time 5  Orientation to Place 5  Registration 3  Attention/ Calculation 2  Recall 3  Language- name 2 objects 2  Language- repeat 1  Language- follow 3 step command 3  Language- read & follow direction 1  Write a sentence 1  Copy design 1  Total score 27        Immunization History  Administered Date(s) Administered  . Fluad Quad(high Dose 65+) 12/14/2018  . Influenza, High Dose Seasonal PF 11/28/2016, 01/22/2018, 12/14/2018   . Influenza-Unspecified 12/10/2015, 11/11/2016, 11/22/2017  . Pneumococcal Conjugate-13 06/29/2016  . Pneumococcal Polysaccharide-23 12/04/2013, 01/05/2019  . Tdap 03/13/2014  . Zoster 03/13/2014    Screening Tests Health Maintenance  Topic Date Due  . OPHTHALMOLOGY EXAM  09/08/2018  . HEMOGLOBIN A1C  10/29/2018  . FOOT EXAM  04/28/2019  . MAMMOGRAM  11/29/2019  . DTaP/Tdap/Td (2 - Td) 03/13/2024  . TETANUS/TDAP  03/13/2024  . COLONOSCOPY  10/05/2025  . INFLUENZA VACCINE  Completed  . DEXA SCAN  Completed  . Hepatitis C Screening  Completed  . PNA vac Low Risk Adult  Completed    Cancer Screenings: Lung: Low Dose CT Chest recommended if Age 81-80 years, 30 pack-year currently smoking OR have quit w/in 15years. Patient does not qualify. Breast:  Up to date on Mammogram? Yes   Up to date of Bone Density/Dexa? Yes Colorectal: colonoscopy UTD  Additional Screenings: Hepatitis C Screening: completed     Plan:  1. Encounter for Medicare annual wellness exam During the course of the visit the patient was educated and counseled about appropriate screening and preventive services including: Fall prevention, Bone densitometry screening, Diabetes screening, Nutrition counseling.  Patient UTD on most required immunizations. Pneumovax today. Will obtain labs at today's visit. .   Patient Instructions (the written plan) was given to the patient.    2. Need for 23-polyvalent pneumococcal polysaccharide vaccine - Pneumococcal polysaccharide vaccine 23-valent greater than or equal to 2yo subcutaneous/IM  3. Essential hypertension BP stable today. Asymptomatic. Continue management per Cardiology. Repeat labs today. - CBC w/Diff - Comp Met (CMET)  4. Type 2 diabetes mellitus without complication, without long-term current use of insulin ([place]) Eye examination recently performed per patient. Will need to request records. Foot exam up-to-date. Due for Pneumovax and agrees to this today.  Repeat labs today.  - CBC w/Diff - Comp Met (CMET) - Hemoglobin A1c - Lipid Profile  5. Hypothyroidism, unspecified type Continue current regimen. Repeat TSH today. Will alter dose according to results.  - TSH  6. Hyperlipidemia, unspecified hyperlipidemia type Repeat lipids today.   7. B12 deficiency - B12  8. Sinus drainage Start saline nasal rinse. Start Flonase nasal spray and resume OTC antihistamine. Put a humidifier in the bedroom. Follow-up if not improving/resolving.  9. Tension Noted by PT. Does not want to add more medication for anxiety. I agree that this does not seem indicated as she is not noting any effect on her quality of life from stressors. Denies panic attack, depressed mood, anhedonia, change to sleep or appetite. Recommend stress relief tactics, yoga or tai chi.   I have personally reviewed and noted the following in the patient's chart:   . Medical and social history . Use of alcohol, tobacco or illicit drugs  . Current medications and supplements . Functional ability and status . Nutritional status . Physical activity . Advanced directives . List of other physicians . Hospitalizations, surgeries, and ER visits in previous 12 months . Vitals . Screenings to include cognitive, depression, and falls . Referrals and appointments  In addition, I have reviewed and discussed with patient certain preventive protocols, quality metrics, and best practice recommendations. A written personalized care plan for preventive services  as well as general preventive health recommendations were provided to patient.     Leeanne Rio, PA-C  01/05/2019

## 2019-01-06 LAB — CBC WITH DIFFERENTIAL/PLATELET
Absolute Monocytes: 749 cells/uL (ref 200–950)
Basophils Absolute: 38 cells/uL (ref 0–200)
Basophils Relative: 0.4 %
Eosinophils Absolute: 173 cells/uL (ref 15–500)
Eosinophils Relative: 1.8 %
HCT: 38.5 % (ref 35.0–45.0)
Hemoglobin: 12.8 g/dL (ref 11.7–15.5)
Lymphs Abs: 2851 cells/uL (ref 850–3900)
MCH: 28 pg (ref 27.0–33.0)
MCHC: 33.2 g/dL (ref 32.0–36.0)
MCV: 84.2 fL (ref 80.0–100.0)
MPV: 11.2 fL (ref 7.5–12.5)
Monocytes Relative: 7.8 %
Neutro Abs: 5789 cells/uL (ref 1500–7800)
Neutrophils Relative %: 60.3 %
Platelets: 375 10*3/uL (ref 140–400)
RBC: 4.57 10*6/uL (ref 3.80–5.10)
RDW: 14.1 % (ref 11.0–15.0)
Total Lymphocyte: 29.7 %
WBC: 9.6 10*3/uL (ref 3.8–10.8)

## 2019-01-06 LAB — COMPREHENSIVE METABOLIC PANEL
AG Ratio: 2 (calc) (ref 1.0–2.5)
ALT: 14 U/L (ref 6–29)
AST: 12 U/L (ref 10–35)
Albumin: 4.2 g/dL (ref 3.6–5.1)
Alkaline phosphatase (APISO): 64 U/L (ref 37–153)
BUN/Creatinine Ratio: 17 (calc) (ref 6–22)
BUN: 18 mg/dL (ref 7–25)
CO2: 24 mmol/L (ref 20–32)
Calcium: 10.1 mg/dL (ref 8.6–10.4)
Chloride: 105 mmol/L (ref 98–110)
Creat: 1.05 mg/dL — ABNORMAL HIGH (ref 0.50–0.99)
Globulin: 2.1 g/dL (calc) (ref 1.9–3.7)
Glucose, Bld: 127 mg/dL — ABNORMAL HIGH (ref 65–99)
Potassium: 3.6 mmol/L (ref 3.5–5.3)
Sodium: 141 mmol/L (ref 135–146)
Total Bilirubin: 0.4 mg/dL (ref 0.2–1.2)
Total Protein: 6.3 g/dL (ref 6.1–8.1)

## 2019-01-06 LAB — LIPID PANEL
Cholesterol: 113 mg/dL (ref <200)
HDL: 44 mg/dL — ABNORMAL LOW (ref >=50)
LDL Cholesterol (Calc): 41 mg/dL (calc)
Non-HDL Cholesterol (Calc): 69 mg/dL (calc) (ref <130)
Total CHOL/HDL Ratio: 2.6 (calc) (ref <5.0)
Triglycerides: 211 mg/dL — ABNORMAL HIGH (ref <150)

## 2019-01-06 LAB — TSH: TSH: 4.59 mIU/L — ABNORMAL HIGH (ref 0.40–4.50)

## 2019-01-06 LAB — HEMOGLOBIN A1C
Hgb A1c MFr Bld: 6.5 % of total Hgb — ABNORMAL HIGH (ref <5.7)
Mean Plasma Glucose: 140 (calc)
eAG (mmol/L): 7.7 (calc)

## 2019-01-06 LAB — VITAMIN B12: Vitamin B-12: 1078 pg/mL (ref 200–1100)

## 2019-01-12 ENCOUNTER — Other Ambulatory Visit: Payer: Self-pay | Admitting: Emergency Medicine

## 2019-01-12 DIAGNOSIS — E039 Hypothyroidism, unspecified: Secondary | ICD-10-CM

## 2019-01-12 MED ORDER — LEVOTHYROXINE SODIUM 88 MCG PO TABS: 75.0000 ug | ORAL_TABLET | Freq: Every day | ORAL | 1 refills | Status: DC

## 2019-01-17 DIAGNOSIS — J3489 Other specified disorders of nose and nasal sinuses: Secondary | ICD-10-CM | POA: Diagnosis not present

## 2019-01-17 DIAGNOSIS — Z20828 Contact with and (suspected) exposure to other viral communicable diseases: Secondary | ICD-10-CM | POA: Diagnosis not present

## 2019-01-26 ENCOUNTER — Ambulatory Visit: Payer: Medicare Other

## 2019-01-26 ENCOUNTER — Other Ambulatory Visit: Payer: Self-pay

## 2019-01-26 ENCOUNTER — Emergency Department (HOSPITAL_COMMUNITY)
Admission: EM | Admit: 2019-01-26 | Discharge: 2019-01-26 | Disposition: A | Discharge status: Home / Self Care | Payer: Medicare Other | Attending: Emergency Medicine | Admitting: Emergency Medicine

## 2019-01-26 ENCOUNTER — Emergency Department (HOSPITAL_COMMUNITY): Payer: Medicare Other

## 2019-01-26 DIAGNOSIS — W108XXA Fall (on) (from) other stairs and steps, initial encounter: Secondary | ICD-10-CM | POA: Insufficient documentation

## 2019-01-26 DIAGNOSIS — Z7984 Long term (current) use of oral hypoglycemic drugs: Secondary | ICD-10-CM | POA: Insufficient documentation

## 2019-01-26 DIAGNOSIS — Y9289 Other specified places as the place of occurrence of the external cause: Secondary | ICD-10-CM | POA: Diagnosis not present

## 2019-01-26 DIAGNOSIS — E039 Hypothyroidism, unspecified: Secondary | ICD-10-CM | POA: Diagnosis not present

## 2019-01-26 DIAGNOSIS — I10 Essential (primary) hypertension: Secondary | ICD-10-CM | POA: Insufficient documentation

## 2019-01-26 DIAGNOSIS — Z79899 Other long term (current) drug therapy: Secondary | ICD-10-CM | POA: Insufficient documentation

## 2019-01-26 DIAGNOSIS — S99911A Unspecified injury of right ankle, initial encounter: Secondary | ICD-10-CM | POA: Diagnosis present

## 2019-01-26 DIAGNOSIS — E119 Type 2 diabetes mellitus without complications: Secondary | ICD-10-CM | POA: Diagnosis not present

## 2019-01-26 DIAGNOSIS — S82831A Other fracture of upper and lower end of right fibula, initial encounter for closed fracture: Secondary | ICD-10-CM | POA: Diagnosis not present

## 2019-01-26 DIAGNOSIS — Y9301 Activity, walking, marching and hiking: Secondary | ICD-10-CM | POA: Insufficient documentation

## 2019-01-26 DIAGNOSIS — Y998 Other external cause status: Secondary | ICD-10-CM | POA: Insufficient documentation

## 2019-01-26 MED ORDER — ASPIRIN EC 81 MG PO TBEC: 81.0000 mg | DELAYED_RELEASE_TABLET | Freq: Every day | ORAL | 0 refills | Status: DC

## 2019-01-26 MED ORDER — HYDROCODONE-ACETAMINOPHEN 5-325 MG PO TABS: 1.0000 | ORAL_TABLET | Freq: Four times a day (QID) | ORAL | 0 refills | Status: DC | PRN

## 2019-01-26 MED ORDER — HYDROCODONE-ACETAMINOPHEN 5-325 MG PO TABS
1.0000 | ORAL_TABLET | Freq: Once | ORAL | Status: AC
  Administered 2019-01-26: 1 via ORAL
  Filled 2019-01-26: qty 1

## 2019-01-26 NOTE — Progress Notes (Signed)
Orthopedic Tech Progress Note Patient Details:  Ashley Woodard 02/01/1951 [id number]  Ortho Devices Type of Ortho Device: Ace wrap, Stirrup splint, Post (short leg) splint, Crutches Ortho Device/Splint Location: right Ortho Device/Splint Interventions: Application   Post Interventions Patient Tolerated: Well Instructions Provided: Care of device   [place] Pink 01/26/2019, 6:18 PM

## 2019-01-26 NOTE — ED Triage Notes (Signed)
Pt tripped and fell this morning, injuring R ankle. Swelling and pain to same. No dizziness prior to fall, did not hit head or sustain other injury. No LOC.

## 2019-01-26 NOTE — ED Notes (Signed)
Pt discharge instructions and prescriptions reviewed with the patient. The patient verbalized understanding of both. Pt discharged. 

## 2019-01-26 NOTE — ED Provider Notes (Signed)
[facility] EMERGENCY DEPARTMENT Provider Note   CSN: [id number] Arrival date & time: 01/26/19  1350     History   Chief Complaint Chief Complaint  Patient presents with  . Ankle Injury    HPI DAVIANNA Woodard is a 68 y.o. female.     Ashley Woodard is a 68 y.o. female with a history of hypertension, hyperlipidemia, diabetes, migraine, chronic low back pain, who presents to the ED for evaluation of right ankle injury after a mechanical fall.  Patient states that around 1 PM today she was walking down some stairs when she realized that she forgot her phone and quickly turned around when she did so she stumbled and rolled her right ankle causing injury.  She was able to catch herself and did not completely fall to the ground.  She denies any other injuries from the fall, did not hit her head.  She is not on any blood thinners.  She reports she had immediate pain and swelling to the lateral aspect of the right ankle.  She has been icing it at home and took 1 dose of extra strength Tylenol with some improvement.  Pain is made worse with movement and palpation.  She has no pain in the foot, and no pain at the knee.  She denies numbness tingling or weakness.  No other aggravating or alleviating factors.     Past Medical History:  Diagnosis Date  . Abnormal cardiovascular stress test 08/21/2016   a. done for abnl EKG/cardiac risk factors -> admitted for cath 07/2016 which showed no significant CAD, normal LV contraction, mildly elevated filling pressure. Low dose Imdur was added for possible component of microvascular dysfunction.   . Allergy   . Anxiety   . Arthritis    "back, knees, fingers" (05/10/2017)  . Chronic lower back pain   . Colon polyps   . Deafness in right ear   . Depression   . Diverticulosis   . Hx of adenomatous colonic polyps 05/08/2003  . Hypertension   . Hypertriglyceridemia   . Hypothyroidism   . Migraine    "none in the 2000s" (05/10/2017)  .  Osteoporosis   . Paroxysmal atrial fibrillation (HCC)   . Pneumonia    "several times" (05/10/2017)  . PONV (postoperative nausea and vomiting)   . S/P cardiac catheterization, 08/20/16 minimal CAD 08/21/2016  . Type II diabetes mellitus ([place])   . Voice tremor     Patient Active Problem List   Diagnosis Date Noted  . Paroxysmal atrial fibrillation ([place]) 05/10/2017  . Hypothyroidism 02/24/2017  . 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 S/P cardiac catheterization, 08/20/16 minimal CAD  08/21/2016  . Unstable angina ([place])   . DDD (degenerative disc disease), lumbar 06/29/2016  . Meniere's disease 06/29/2016  . Diabetes mellitus, type 2 ([place]) 09/30/2014  . Hyperlipemia 09/30/2014  . Hypertension 09/30/2014  . Hx of adenomatous colonic polyps 05/08/2003    Past Surgical History:  Procedure Laterality Date  . ABDOMINAL HYSTERECTOMY  1988  . ATRIAL FIBRILLATION ABLATION N/A 05/10/2017   Procedure: ATRIAL FIBRILLATION ABLATION;  Surgeon: Thompson Grayer, MD;  Location: [facility];  Service: Cardiovascular;  Laterality: N/A;  . COCHLEAR IMPLANT Right 2008  . COLONOSCOPY    . LEFT HEART CATH AND CORONARY ANGIOGRAPHY N/A 08/20/2016   Procedure: Left Heart Cath and Coronary Angiography;  Surgeon: Nelva Bush, MD;  Location: [facility];  Service: Cardiovascular;  Laterality: N/A;  . LOOP RECORDER INSERTION N/A  11/29/2017   Procedure: LOOP RECORDER INSERTION;  Surgeon: Hillis Range, MD;  Location: [facility];  Service: Cardiovascular;  Laterality: N/A;     OB History   No obstetric history on file.      Home Medications    Prior to Admission medications   Medication Sig Start Date End Date Taking? Authorizing Provider  amLODipine (NORVASC) 10 MG tablet TAKE 1 TABLET DAILY 07/18/18   Waldon Merl, PA-C  Cholecalciferol (VITAMIN D) 2000 units CAPS Take 1 capsule by mouth daily.     [provider]  doxylamine, Sleep, (SLEEP AID) 25 MG tablet Take 25 mg  by mouth at bedtime.    [provider]  DULoxetine (CYMBALTA) 30 MG capsule TAKE 1 CAPSULE DAILY 12/26/18   Waldon Merl, PA-C  glucose blood (CONTOUR NEXT TEST) test strip Check blood sugars once daily 06/15/17   Waldon Merl, PA-C  hydrALAZINE (APRESOLINE) 50 MG tablet TAKE 1 TABLET BY MOUTH TWICE A DAY 11/29/18   Waldon Merl, PA-C  hydrochlorothiazide (HYDRODIURIL) 25 MG tablet TAKE 1 TABLET DAILY 07/19/18   Allred, Fayrene Fearing, MD  irbesartan (AVAPRO) 150 MG tablet TAKE 1 TABLET DAILY 07/19/18   Allred, Fayrene Fearing, MD  KLOR-CON M20 20 MEQ tablet TAKE 1 TABLET TWICE A DAY 07/19/18   Allred, Fayrene Fearing, MD  LANCETS ULTRA THIN 30G MISC 1 Stick by Does not apply route daily. 06/15/17   Waldon Merl, PA-C  levothyroxine (SYNTHROID) 88 MCG tablet Take 1 tablet (88 mcg total) by mouth daily. 01/12/19   Waldon Merl, PA-C  Magnesium 200 MG TABS Take 200 mg by mouth daily.     [provider]  metFORMIN (GLUCOPHAGE) 500 MG tablet TAKE 1 TABLET TWICE DAILY  WITH MEALS 07/18/18   Waldon Merl, PA-C  Omega-3 Fatty Acids (FISH OIL) 1000 MG CAPS Take 1,000 mg by mouth daily.     [provider]  simvastatin (ZOCOR) 20 MG tablet Take 1 tablet (20 mg total) by mouth 3 (three) times a week. Monday, Wednesday, Friday 11/22/18 02/20/19  Waldon Merl, PA-C  vitamin B-12 (CYANOCOBALAMIN) 1000 MCG tablet Take 1 tablet (1,000 mcg total) by mouth daily. 11/08/16   Waldon Merl, PA-C    Family History Family History  Problem Relation Age of Onset  . Hypertension Mother   . Diabetes Mother   . Heart attack Mother   . Colon polyps Mother   . Hypertension Father   . Heart attack Father   . Stroke Father   . Breast cancer Sister 74  . Hypertension Brother   . Cancer Brother        Prostate  . Diabetes Brother   . Dementia Brother   . Stroke Brother   . Heart attack Brother   . Prostate cancer Brother   . Hypertension Son   . Breast cancer Maternal Aunt        in  48's  . Cancer Maternal Uncle        Lung  . Colon cancer Neg Hx   . Esophageal cancer Neg Hx   . Rectal cancer Neg Hx   . Stomach cancer Neg Hx     Social History Social History   Tobacco Use  . Smoking status: Never Smoker  . Smokeless tobacco: Never Used  Substance Use Topics  . Alcohol use: No  . Drug use: No     Allergies   Demeclocycline, Doxycycline, Erythromycin, Lisinopril, and Tetracyclines & related  Review of Systems Review of Systems  Constitutional: Negative for chills and fever.  Musculoskeletal: Positive for arthralgias and joint swelling.  Skin: Negative for color change and rash.  Neurological: Negative for weakness and numbness.     Physical Exam Updated Vital Signs BP (!) 152/75 (BP Location: Right Arm)   Pulse 70   Temp 99.3 F (37.4 C) (Oral)   Resp 12   SpO2 100%   Physical Exam Vitals signs and nursing note reviewed.  Constitutional:      General: She is not in acute distress.    Appearance: Normal appearance. She is well-developed and normal weight. She is not ill-appearing or diaphoretic.  HENT:     Head: Normocephalic and atraumatic.  Eyes:     General:        Right eye: No discharge.        Left eye: No discharge.  Pulmonary:     Effort: Pulmonary effort is normal. No respiratory distress.  Musculoskeletal:     Comments: Right ankle with pain and swelling over the lateral malleolus, no obvious bony deformity, 2+ DP and TP pulses and good capillary refill, sensation intact.  Dorsi and plantar flexion limited due to pain.  No tenderness at the or over the distal foot.  Skin:    General: Skin is warm and dry.     Capillary Refill: Capillary refill takes less than 2 seconds.  Neurological:     Mental Status: She is alert and oriented to person, place, and time.     Coordination: Coordination normal.  Psychiatric:        Mood and Affect: Mood normal.        Behavior: Behavior normal.      ED Treatments / Results  Labs  (all labs ordered are listed, but only abnormal results are displayed) Labs Reviewed - No data to display  EKG None  Radiology Dg Ankle Complete Right  Result Date: 01/26/2019 CLINICAL DATA:  Fall, pain EXAM: RIGHT ANKLE - COMPLETE 3+ VIEW COMPARISON:  None. FINDINGS: Soft tissue swelling at the ankle. There is no dislocation. Minimally displaced fracture is present through the distal fibula. Joint spaces are preserved. IMPRESSION: Minimally displaced acute fracture through the distal fibula. Electronically Signed   By: Guadlupe SpanishPraneil  Patel M.D.   On: 01/26/2019 16:14    Procedures Procedures (including critical care time)  Medications Ordered in ED Medications  HYDROcodone-acetaminophen (NORCO/VICODIN) 5-325 MG per tablet 1 tablet (1 tablet Oral Given 01/26/19 1746)    Initial Impression / Assessment and Plan / ED Course  I have reviewed the triage vital signs and the nursing notes.  Pertinent labs & imaging results that were available during my care of the patient were reviewed by me and considered in my medical decision making (see chart for details).  68 year old female presents with right ankle injury after mechanical fall, she did not hit her head and did not sustain any other injuries from the fall.  Pain and swelling localized to the lateral aspect of the right ankle, ankle is neurovascularly intact.  X-ray shows a minimally displaced fracture of the distal fibula.  I discussed case with Dr. Roda ShuttersXu with orthopedics who reviewed patient's x-rays and recommends posterior splint with stirrup, crutches and nonweightbearing.  Pain management.  He also request that the patient take daily baby aspirin.  He will see the patient in the office for follow-up.  I discussed plan and care instructions with patient.  Return precautions discussed.  She expresses understanding and  agreement with plan.  Discharged home in good condition.  Patient discussed with Dr. Clarene Duke, who saw patient as well and agrees  with plan.    Final Clinical Impressions(s) / ED Diagnoses   Final diagnoses:  Closed fracture of distal end of right fibula, unspecified fracture morphology, initial encounter    ED Discharge Orders         Ordered    HYDROcodone-acetaminophen (NORCO) 5-325 MG tablet  Every 6 hours PRN     01/26/19 1901    aspirin EC 81 MG tablet  Daily     01/26/19 1901           Legrand Rams 01/26/19 1939    Little, Ambrose Finland, MD 01/28/19 2332

## 2019-01-26 NOTE — Discharge Instructions (Signed)
Keep you will need to call to schedule follow-up appointment with Dr. Erlinda Hong.  Keep splint in place, clean and dry and use crutches, do not bear weight on the leg.  Use prescribed Norco plus 650 mg of Tylenol every 6 hours as needed for pain.  Ice and elevate the ankle.  Take a baby aspirin once daily to help prevent blood clots.  If you develop significantly worsened pain, discoloration of your toes, tingling or numbness or any other new or concerning symptoms return to the ED.

## 2019-01-30 NOTE — Progress Notes (Signed)
Carelink Summary Report / Loop Recorder 

## 2019-01-31 ENCOUNTER — Other Ambulatory Visit: Payer: Self-pay | Admitting: Orthopaedic Surgery

## 2019-01-31 ENCOUNTER — Other Ambulatory Visit: Payer: Self-pay

## 2019-01-31 ENCOUNTER — Ambulatory Visit: Payer: Medicare Other | Admitting: Orthopaedic Surgery

## 2019-01-31 ENCOUNTER — Encounter (HOSPITAL_BASED_OUTPATIENT_CLINIC_OR_DEPARTMENT_OTHER): Payer: Self-pay

## 2019-01-31 DIAGNOSIS — S8251XA Displaced fracture of medial malleolus of right tibia, initial encounter for closed fracture: Secondary | ICD-10-CM | POA: Diagnosis not present

## 2019-01-31 NOTE — Progress Notes (Signed)
Chart reviewed with Dr. Christella Hartigan. Ok to proceed with surgery as scheduled without further cardiac clearance.

## 2019-02-01 ENCOUNTER — Ambulatory Visit (INDEPENDENT_AMBULATORY_CARE_PROVIDER_SITE_OTHER): Payer: Medicare Other | Admitting: *Deleted

## 2019-02-01 DIAGNOSIS — I480 Paroxysmal atrial fibrillation: Secondary | ICD-10-CM | POA: Diagnosis not present

## 2019-02-01 LAB — CUP PACEART REMOTE DEVICE CHECK
Date Time Interrogation Session: 20201210130508
Implantable Pulse Generator Implant Date: 20191008

## 2019-02-02 ENCOUNTER — Other Ambulatory Visit (HOSPITAL_COMMUNITY)
Admission: RE | Admit: 2019-02-02 | Discharge: 2019-02-02 | Disposition: A | Discharge status: Home / Self Care | Payer: Medicare Other | Source: Ambulatory Visit | Attending: Orthopaedic Surgery | Admitting: Orthopaedic Surgery

## 2019-02-02 ENCOUNTER — Other Ambulatory Visit: Payer: Self-pay

## 2019-02-02 ENCOUNTER — Encounter (HOSPITAL_BASED_OUTPATIENT_CLINIC_OR_DEPARTMENT_OTHER)
Admission: RE | Admit: 2019-02-02 | Discharge: 2019-02-02 | Disposition: A | Discharge status: Home / Self Care | Payer: Medicare Other | Source: Ambulatory Visit | Attending: Orthopaedic Surgery | Admitting: Orthopaedic Surgery

## 2019-02-02 DIAGNOSIS — Z20828 Contact with and (suspected) exposure to other viral communicable diseases: Secondary | ICD-10-CM | POA: Insufficient documentation

## 2019-02-02 DIAGNOSIS — Z01812 Encounter for preprocedural laboratory examination: Secondary | ICD-10-CM | POA: Insufficient documentation

## 2019-02-02 DIAGNOSIS — F419 Anxiety disorder, unspecified: Secondary | ICD-10-CM | POA: Diagnosis not present

## 2019-02-02 DIAGNOSIS — W19XXXA Unspecified fall, initial encounter: Secondary | ICD-10-CM | POA: Diagnosis not present

## 2019-02-02 DIAGNOSIS — Z7984 Long term (current) use of oral hypoglycemic drugs: Secondary | ICD-10-CM | POA: Diagnosis not present

## 2019-02-02 DIAGNOSIS — Z9621 Cochlear implant status: Secondary | ICD-10-CM | POA: Diagnosis not present

## 2019-02-02 DIAGNOSIS — Z79899 Other long term (current) drug therapy: Secondary | ICD-10-CM | POA: Diagnosis not present

## 2019-02-02 DIAGNOSIS — S8261XA Displaced fracture of lateral malleolus of right fibula, initial encounter for closed fracture: Secondary | ICD-10-CM | POA: Diagnosis not present

## 2019-02-02 DIAGNOSIS — E119 Type 2 diabetes mellitus without complications: Secondary | ICD-10-CM | POA: Diagnosis not present

## 2019-02-02 DIAGNOSIS — I10 Essential (primary) hypertension: Secondary | ICD-10-CM | POA: Diagnosis not present

## 2019-02-02 DIAGNOSIS — E781 Pure hyperglyceridemia: Secondary | ICD-10-CM | POA: Diagnosis not present

## 2019-02-02 DIAGNOSIS — S93431A Sprain of tibiofibular ligament of right ankle, initial encounter: Secondary | ICD-10-CM | POA: Diagnosis not present

## 2019-02-02 DIAGNOSIS — Z7982 Long term (current) use of aspirin: Secondary | ICD-10-CM | POA: Diagnosis not present

## 2019-02-02 DIAGNOSIS — E039 Hypothyroidism, unspecified: Secondary | ICD-10-CM | POA: Diagnosis not present

## 2019-02-02 DIAGNOSIS — Z79890 Hormone replacement therapy (postmenopausal): Secondary | ICD-10-CM | POA: Diagnosis not present

## 2019-02-02 DIAGNOSIS — F329 Major depressive disorder, single episode, unspecified: Secondary | ICD-10-CM | POA: Diagnosis not present

## 2019-02-02 DIAGNOSIS — G4730 Sleep apnea, unspecified: Secondary | ICD-10-CM | POA: Diagnosis not present

## 2019-02-02 DIAGNOSIS — H9191 Unspecified hearing loss, right ear: Secondary | ICD-10-CM | POA: Diagnosis not present

## 2019-02-02 LAB — BASIC METABOLIC PANEL
Anion gap: 10 (ref 5–15)
BUN: 14 mg/dL (ref 8–23)
CO2: 20 mmol/L — ABNORMAL LOW (ref 22–32)
Calcium: 9.2 mg/dL (ref 8.9–10.3)
Chloride: 108 mmol/L (ref 98–111)
Creatinine, Ser: 0.86 mg/dL (ref 0.44–1.00)
GFR calc Af Amer: >60 mL/min (ref >60)
GFR calc non Af Amer: >60 mL/min (ref >60)
Glucose, Bld: 129 mg/dL — ABNORMAL HIGH (ref 70–99)
Potassium: 4.6 mmol/L (ref 3.5–5.1)
Sodium: 138 mmol/L (ref 135–145)

## 2019-02-02 NOTE — Progress Notes (Signed)

## 2019-02-03 LAB — NOVEL CORONAVIRUS, NAA (HOSP ORDER, SEND-OUT TO REF LAB; TAT 18-24 HRS): SARS-CoV-2, NAA: NOT DETECTED

## 2019-02-04 ENCOUNTER — Encounter: Payer: Self-pay | Admitting: Physician Assistant

## 2019-02-06 ENCOUNTER — Ambulatory Visit (HOSPITAL_COMMUNITY): Payer: Medicare Other

## 2019-02-06 ENCOUNTER — Encounter (HOSPITAL_BASED_OUTPATIENT_CLINIC_OR_DEPARTMENT_OTHER): Payer: Self-pay | Admitting: Orthopaedic Surgery

## 2019-02-06 ENCOUNTER — Ambulatory Visit (HOSPITAL_BASED_OUTPATIENT_CLINIC_OR_DEPARTMENT_OTHER): Payer: Medicare Other | Admitting: Anesthesiology

## 2019-02-06 ENCOUNTER — Other Ambulatory Visit: Payer: Self-pay

## 2019-02-06 ENCOUNTER — Ambulatory Visit (HOSPITAL_BASED_OUTPATIENT_CLINIC_OR_DEPARTMENT_OTHER)
Admission: RE | Admit: 2019-02-06 | Discharge: 2019-02-06 | Disposition: A | Discharge status: Home / Self Care | Payer: Medicare Other | Attending: Orthopaedic Surgery | Admitting: Orthopaedic Surgery

## 2019-02-06 ENCOUNTER — Encounter (HOSPITAL_BASED_OUTPATIENT_CLINIC_OR_DEPARTMENT_OTHER)
Admission: RE | Disposition: A | Discharge status: Home / Self Care | Payer: Self-pay | Source: Home / Self Care | Attending: Orthopaedic Surgery

## 2019-02-06 DIAGNOSIS — E781 Pure hyperglyceridemia: Secondary | ICD-10-CM | POA: Insufficient documentation

## 2019-02-06 DIAGNOSIS — H9191 Unspecified hearing loss, right ear: Secondary | ICD-10-CM | POA: Insufficient documentation

## 2019-02-06 DIAGNOSIS — Z7982 Long term (current) use of aspirin: Secondary | ICD-10-CM | POA: Insufficient documentation

## 2019-02-06 DIAGNOSIS — S8261XA Displaced fracture of lateral malleolus of right fibula, initial encounter for closed fracture: Secondary | ICD-10-CM | POA: Insufficient documentation

## 2019-02-06 DIAGNOSIS — Z7984 Long term (current) use of oral hypoglycemic drugs: Secondary | ICD-10-CM | POA: Insufficient documentation

## 2019-02-06 DIAGNOSIS — E119 Type 2 diabetes mellitus without complications: Secondary | ICD-10-CM | POA: Insufficient documentation

## 2019-02-06 DIAGNOSIS — Z79890 Hormone replacement therapy (postmenopausal): Secondary | ICD-10-CM | POA: Insufficient documentation

## 2019-02-06 DIAGNOSIS — F419 Anxiety disorder, unspecified: Secondary | ICD-10-CM | POA: Insufficient documentation

## 2019-02-06 DIAGNOSIS — I10 Essential (primary) hypertension: Secondary | ICD-10-CM | POA: Insufficient documentation

## 2019-02-06 DIAGNOSIS — Z79899 Other long term (current) drug therapy: Secondary | ICD-10-CM | POA: Insufficient documentation

## 2019-02-06 DIAGNOSIS — Z9621 Cochlear implant status: Secondary | ICD-10-CM | POA: Insufficient documentation

## 2019-02-06 DIAGNOSIS — Z419 Encounter for procedure for purposes other than remedying health state, unspecified: Secondary | ICD-10-CM

## 2019-02-06 DIAGNOSIS — G4730 Sleep apnea, unspecified: Secondary | ICD-10-CM | POA: Insufficient documentation

## 2019-02-06 DIAGNOSIS — E039 Hypothyroidism, unspecified: Secondary | ICD-10-CM | POA: Insufficient documentation

## 2019-02-06 DIAGNOSIS — F329 Major depressive disorder, single episode, unspecified: Secondary | ICD-10-CM | POA: Insufficient documentation

## 2019-02-06 DIAGNOSIS — W19XXXA Unspecified fall, initial encounter: Secondary | ICD-10-CM | POA: Insufficient documentation

## 2019-02-06 DIAGNOSIS — S93431A Sprain of tibiofibular ligament of right ankle, initial encounter: Secondary | ICD-10-CM | POA: Insufficient documentation

## 2019-02-06 HISTORY — PX: ORIF ANKLE FRACTURE: SHX5408

## 2019-02-06 LAB — GLUCOSE, CAPILLARY
Glucose-Capillary: 117 mg/dL — ABNORMAL HIGH (ref 70–99)
Glucose-Capillary: 110 mg/dL — ABNORMAL HIGH (ref 70–99)

## 2019-02-06 SURGERY — OPEN REDUCTION INTERNAL FIXATION (ORIF) ANKLE FRACTURE
Anesthesia: General | Site: Ankle | Laterality: Right

## 2019-02-06 MED ORDER — SCOPOLAMINE 1 MG/3DAYS TD PT72
1.0000 | MEDICATED_PATCH | TRANSDERMAL | Status: DC
  Administered 2019-02-06: 10:00:00 1.5 mg via TRANSDERMAL

## 2019-02-06 MED ORDER — LACTATED RINGERS IV SOLN: INTRAVENOUS | Status: DC | PRN

## 2019-02-06 MED ORDER — LIDOCAINE 2% (20 MG/ML) 5 ML SYRINGE
INTRAMUSCULAR | Status: AC
  Filled 2019-02-06: qty 5

## 2019-02-06 MED ORDER — OXYCODONE HCL 5 MG PO TABS: 5.0000 mg | ORAL_TABLET | Freq: Once | ORAL | Status: DC | PRN

## 2019-02-06 MED ORDER — FENTANYL CITRATE (PF) 100 MCG/2ML IJ SOLN
INTRAMUSCULAR | Status: AC
  Filled 2019-02-06: qty 2

## 2019-02-06 MED ORDER — CEFAZOLIN SODIUM-DEXTROSE 2-4 GM/100ML-% IV SOLN: 2.0000 g | INTRAVENOUS | Status: DC

## 2019-02-06 MED ORDER — HYDROMORPHONE HCL 1 MG/ML IJ SOLN: 0.2500 mg | INTRAMUSCULAR | Status: DC | PRN

## 2019-02-06 MED ORDER — POVIDONE-IODINE 10 % EX SWAB: 2.0000 "application " | Freq: Once | CUTANEOUS | Status: DC

## 2019-02-06 MED ORDER — SCOPOLAMINE 1 MG/3DAYS TD PT72
MEDICATED_PATCH | TRANSDERMAL | Status: AC
  Filled 2019-02-06: qty 1

## 2019-02-06 MED ORDER — DEXAMETHASONE SODIUM PHOSPHATE 10 MG/ML IJ SOLN
INTRAMUSCULAR | Status: AC
  Filled 2019-02-06: qty 1

## 2019-02-06 MED ORDER — DEXAMETHASONE SODIUM PHOSPHATE 10 MG/ML IJ SOLN
INTRAMUSCULAR | Status: DC | PRN
  Administered 2019-02-06: 5 mg via INTRAVENOUS

## 2019-02-06 MED ORDER — MIDAZOLAM HCL 2 MG/2ML IJ SOLN
1.0000 mg | INTRAMUSCULAR | Status: DC | PRN
  Administered 2019-02-06: 10:00:00 2 mg via INTRAVENOUS

## 2019-02-06 MED ORDER — ROPIVACAINE HCL 5 MG/ML IJ SOLN
INTRAMUSCULAR | Status: DC | PRN
  Administered 2019-02-06: 50 mg via EPIDURAL

## 2019-02-06 MED ORDER — 0.9 % SODIUM CHLORIDE (POUR BTL) OPTIME
TOPICAL | Status: DC | PRN
  Administered 2019-02-06: 200 mL

## 2019-02-06 MED ORDER — CEFAZOLIN SODIUM-DEXTROSE 2-4 GM/100ML-% IV SOLN
INTRAVENOUS | Status: AC
  Filled 2019-02-06: qty 100

## 2019-02-06 MED ORDER — LACTATED RINGERS IV SOLN: INTRAVENOUS | Status: DC

## 2019-02-06 MED ORDER — OXYCODONE HCL 5 MG/5ML PO SOLN: 5.0000 mg | Freq: Once | ORAL | Status: DC | PRN

## 2019-02-06 MED ORDER — PHENYLEPHRINE HCL (PRESSORS) 10 MG/ML IV SOLN
INTRAVENOUS | Status: DC | PRN
  Administered 2019-02-06: 120 ug via INTRAVENOUS
  Administered 2019-02-06 (×2): 80 ug via INTRAVENOUS

## 2019-02-06 MED ORDER — FENTANYL CITRATE (PF) 100 MCG/2ML IJ SOLN
50.0000 ug | INTRAMUSCULAR | Status: DC | PRN
  Administered 2019-02-06: 10:00:00 50 ug via INTRAVENOUS

## 2019-02-06 MED ORDER — PROPOFOL 10 MG/ML IV BOLUS
INTRAVENOUS | Status: AC
  Filled 2019-02-06: qty 20

## 2019-02-06 MED ORDER — MIDAZOLAM HCL 2 MG/2ML IJ SOLN
INTRAMUSCULAR | Status: AC
  Filled 2019-02-06: qty 2

## 2019-02-06 MED ORDER — ONDANSETRON HCL 4 MG/2ML IJ SOLN
INTRAMUSCULAR | Status: DC | PRN
  Administered 2019-02-06: 4 mg via INTRAVENOUS

## 2019-02-06 MED ORDER — PROMETHAZINE HCL 25 MG/ML IJ SOLN: 6.2500 mg | INTRAMUSCULAR | Status: DC | PRN

## 2019-02-06 MED ORDER — OXYCODONE HCL 5 MG PO TABS: 5.0000 mg | ORAL_TABLET | ORAL | 0 refills | Status: AC | PRN

## 2019-02-06 MED ORDER — ONDANSETRON HCL 4 MG/2ML IJ SOLN
INTRAMUSCULAR | Status: AC
  Filled 2019-02-06: qty 2

## 2019-02-06 MED ORDER — CEFAZOLIN SODIUM-DEXTROSE 2-3 GM-%(50ML) IV SOLR
INTRAVENOUS | Status: DC | PRN
  Administered 2019-02-06: 2 g via INTRAVENOUS

## 2019-02-06 MED ORDER — MEPERIDINE HCL 25 MG/ML IJ SOLN: 6.2500 mg | INTRAMUSCULAR | Status: DC | PRN

## 2019-02-06 MED ORDER — PROPOFOL 10 MG/ML IV BOLUS
INTRAVENOUS | Status: DC | PRN
  Administered 2019-02-06: 200 mg via INTRAVENOUS

## 2019-02-06 SURGICAL SUPPLY — 71 items
BANDAGE ESMARK 6X9 LF (GAUZE/BANDAGES/DRESSINGS) ×1 IMPLANT
BENZOIN TINCTURE PRP APPL 2/3 (GAUZE/BANDAGES/DRESSINGS) IMPLANT
BIT DRILL 2 CANN GRADUATED (BIT) ×3 IMPLANT
BIT DRILL 2.5 CANN ENDOSCOPIC (BIT) ×3 IMPLANT
BIT DRILL 3 CANN ENDOSCOPIC (BIT) ×3 IMPLANT
BLADE SURG 15 STRL LF DISP TIS (BLADE) ×2 IMPLANT
BLADE SURG 15 STRL SS (BLADE) ×4
BNDG COHESIVE 4X5 TAN STRL (GAUZE/BANDAGES/DRESSINGS) IMPLANT
BNDG ELASTIC 4X5.8 VLCR STR LF (GAUZE/BANDAGES/DRESSINGS) IMPLANT
BNDG ELASTIC 6X5.8 VLCR STR LF (GAUZE/BANDAGES/DRESSINGS) ×6 IMPLANT
BNDG ESMARK 4X9 LF (GAUZE/BANDAGES/DRESSINGS) IMPLANT
BNDG ESMARK 6X9 LF (GAUZE/BANDAGES/DRESSINGS) ×3
CHLORAPREP W/TINT 26 (MISCELLANEOUS) ×6 IMPLANT
CLOSURE WOUND 1/2 X4 (GAUZE/BANDAGES/DRESSINGS)
COVER BACK TABLE REUSABLE LG (DRAPES) ×3 IMPLANT
COVER WAND RF STERILE (DRAPES) IMPLANT
CUFF TOURN SGL QUICK 34 (TOURNIQUET CUFF) ×2
CUFF TRNQT CYL 34X4.125X (TOURNIQUET CUFF) ×1 IMPLANT
DECANTER SPIKE VIAL GLASS SM (MISCELLANEOUS) IMPLANT
DRAPE C-ARM 42X72 X-RAY (DRAPES) ×3 IMPLANT
DRAPE C-ARMOR (DRAPES) ×3 IMPLANT
DRAPE EXTREMITY T 121X128X90 (DISPOSABLE) ×3 IMPLANT
DRAPE HALF SHEET 70X43 (DRAPES) ×3 IMPLANT
DRAPE IMP U-DRAPE 54X76 (DRAPES) ×3 IMPLANT
DRAPE OEC MINIVIEW 54X84 (DRAPES) IMPLANT
DRAPE U-SHAPE 47X51 STRL (DRAPES) ×3 IMPLANT
ELECT REM PT RETURN 9FT ADLT (ELECTROSURGICAL) ×3
ELECTRODE REM PT RTRN 9FT ADLT (ELECTROSURGICAL) ×1 IMPLANT
GAUZE SPONGE 4X4 12PLY STRL (GAUZE/BANDAGES/DRESSINGS) ×3 IMPLANT
GAUZE XEROFORM 1X8 LF (GAUZE/BANDAGES/DRESSINGS) ×3 IMPLANT
GLOVE BIOGEL M STRL SZ7.5 (GLOVE) ×6 IMPLANT
GLOVE BIOGEL PI IND STRL 8 (GLOVE) ×1 IMPLANT
GLOVE BIOGEL PI INDICATOR 8 (GLOVE) ×2
GOWN STRL REUS W/ TWL LRG LVL3 (GOWN DISPOSABLE) ×1 IMPLANT
GOWN STRL REUS W/ TWL XL LVL3 (GOWN DISPOSABLE) ×1 IMPLANT
GOWN STRL REUS W/TWL LRG LVL3 (GOWN DISPOSABLE) ×2
GOWN STRL REUS W/TWL XL LVL3 (GOWN DISPOSABLE) ×2
NS IRRIG 1000ML POUR BTL (IV SOLUTION) ×3 IMPLANT
PACK BASIN DAY SURGERY FS (CUSTOM PROCEDURE TRAY) ×3 IMPLANT
PAD CAST 4YDX4 CTTN HI CHSV (CAST SUPPLIES) ×1 IMPLANT
PADDING CAST COTTON 4X4 STRL (CAST SUPPLIES) ×2
PADDING CAST SYNTHETIC 4 (CAST SUPPLIES) ×4
PADDING CAST SYNTHETIC 4X4 STR (CAST SUPPLIES) ×2 IMPLANT
PENCIL SMOKE EVACUATOR (MISCELLANEOUS) ×3 IMPLANT
PLATE DST LCK RT H4 (Plate) ×3 IMPLANT
SCREW CANCELLOUS 3X24MM (Screw) ×6 IMPLANT
SCREW LOCK T10 FT 18X2.7X (Screw) ×1 IMPLANT
SCREW LOCKING 2.7X10 ANKLE (Screw) ×3 IMPLANT
SCREW LOCKING 2.7X16MM (Screw) ×3 IMPLANT
SCREW LOCKING 2.7X18MM (Screw) ×2 IMPLANT
SCREW LOW PROFILE 3.5X14 (Screw) ×6 IMPLANT
SCREW LOW PROFILE 3.5X16 (Screw) ×3 IMPLANT
SLEEVE SCD COMPRESS KNEE MED (MISCELLANEOUS) ×3 IMPLANT
SPLINT FAST PLASTER 5X30 (CAST SUPPLIES) ×40
SPLINT PLASTER CAST FAST 5X30 (CAST SUPPLIES) ×20 IMPLANT
SPONGE LAP 18X18 RF (DISPOSABLE) IMPLANT
STAPLER VISISTAT 35W (STAPLE) IMPLANT
STOCKINETTE 6  STRL (DRAPES) ×2
STOCKINETTE 6 STRL (DRAPES) ×1 IMPLANT
STRIP CLOSURE SKIN 1/2X4 (GAUZE/BANDAGES/DRESSINGS) IMPLANT
SUCTION FRAZIER HANDLE 10FR (MISCELLANEOUS) ×2
SUCTION TUBE FRAZIER 10FR DISP (MISCELLANEOUS) ×1 IMPLANT
SUT ETHILON 3 0 PS 1 (SUTURE) ×3 IMPLANT
SUT MNCRL AB 3-0 PS2 18 (SUTURE) ×3 IMPLANT
SUT PDS AB 2-0 CT2 27 (SUTURE) ×3 IMPLANT
SUT VIC AB 3-0 FS2 27 (SUTURE) IMPLANT
SYR BULB 3OZ (MISCELLANEOUS) ×3 IMPLANT
TOWEL GREEN STERILE FF (TOWEL DISPOSABLE) ×6 IMPLANT
TUBE CONNECTING 20'X1/4 (TUBING) ×1
TUBE CONNECTING 20X1/4 (TUBING) ×2 IMPLANT
UNDERPAD 30X36 HEAVY ABSORB (UNDERPADS AND DIAPERS) ×3 IMPLANT

## 2019-02-06 NOTE — Discharge Instructions (Signed)
DR[facility] POST-OP INSTRUCTIONS   Pain Management 1. The numbing medicine and your leg will last around 18 hours, take a dose of your pain medicine as soon as you feel it wearing off to avoid rebound pain. 2. Keep your foot elevated above heart level.  Make sure that your heel hangs free ('floats'). 3. Take all prescribed medication as directed. 4. If taking narcotic pain medication you may want to use an over-the-counter stool softener to avoid constipation. 5. You may take over-the-counter NSAIDs (ibuprofen, naproxen, etc.) as well as over-the-counter acetaminophen as directed on the packaging as a supplement for your pain and may also use it to wean away from the prescription medication.  Activity ? Non-weightbearing ? Keep splint intact  First Postoperative Visit 1. Your first postop visit will be at least 2 weeks after surgery.  This should be scheduled when you schedule surgery. 2. If you do not have a postoperative visit scheduled please call [phone] to schedule an appointment. 3. At the appointment your incision will be evaluated for suture removal, x-rays will be obtained if necessary.  General Instructions 1. Swelling is very common after foot and ankle surgery.  It often takes 3 months for the foot and ankle to begin to feel comfortable.  Some amount of swelling will persist for 6-12 months. 2. DO NOT change the dressing.  If there is a problem with the dressing (too tight, loose, gets wet, etc.) please contact Dr. Adair's office. 3. DO NOT get the dressing wet.  For showers you can use an over-the-counter cast cover or wrap a washcloth around the top of your dressing and then cover it with a plastic bag and tape it to your leg. 4. DO NOT soak the incision (no tubs, pools, bath, etc.) until you have approval from Dr. Adair.  Contact Dr. Adairs office or go to Emergency Room if: 1. Temperature above 101 F. 2. Increasing pain that is unresponsive to pain  medication or elevation 3. Excessive redness or swelling in your foot 4. Dressing problems - excessive bloody drainage, looseness or tightness, or if dressing gets wet 5. Develop pain, swelling, warmth, or discoloration of your calf   Post Anesthesia Home Care Instructions  Activity: Get plenty of rest for the remainder of the day. A responsible individual must stay with you for 24 hours following the procedure.  For the next 24 hours, DO NOT: -Drive a car -Operate machinery -Drink alcoholic beverages -Take any medication unless instructed by your physician -Make any legal decisions or sign important papers.  Meals: Start with liquid foods such as gelatin or soup. Progress to regular foods as tolerated. Avoid greasy, spicy, heavy foods. If nausea and/or vomiting occur, drink only clear liquids until the nausea and/or vomiting subsides. Call your physician if vomiting continues.  Special Instructions/Symptoms: Your throat may feel dry or sore from the anesthesia or the breathing tube placed in your throat during surgery. If this causes discomfort, gargle with warm salt water. The discomfort should disappear within 24 hours.  If you had a scopolamine patch placed behind your ear for the management of post- operative nausea and/or vomiting:  1. The medication in the patch is effective for 72 hours, after which it should be removed.  Wrap patch in a tissue and discard in the trash. Wash hands thoroughly with soap and water. 2. You may remove the patch earlier than 72 hours if you experience unpleasant side effects which may include dry mouth, dizziness   or visual disturbances. 3. Avoid touching the patch. Wash your hands with soap and water after contact with the patch.     Regional Anesthesia Blocks  1. Numbness or the inability to move the "blocked" extremity may last from 3-48 hours after placement. The length of time depends on the medication injected and your individual response to  the medication. If the numbness is not going away after 48 hours, call your surgeon.  2. The extremity that is blocked will need to be protected until the numbness is gone and the  Strength has returned. Because you cannot feel it, you will need to take extra care to avoid injury. Because it may be weak, you may have difficulty moving it or using it. You may not know what position it is in without looking at it while the block is in effect.  3. For blocks in the legs and feet, returning to weight bearing and walking needs to be done carefully. You will need to wait until the numbness is entirely gone and the strength has returned. You should be able to move your leg and foot normally before you try and bear weight or walk. You will need someone to be with you when you first try to ensure you do not fall and possibly risk injury.  4. Bruising and tenderness at the needle site are common side effects and will resolve in a few days.  5. Persistent numbness or new problems with movement should be communicated to the surgeon or the [facility] ([phone])/ [facility] ([phone]).  

## 2019-02-06 NOTE — Anesthesia Postprocedure Evaluation (Signed)
Anesthesia Post Note  Patient: Ashley Woodard  Procedure(s) Performed: OPEN REDUCTION INTERNAL FIXATION (ORIF) ANKLE FRACTURE LATERAL MALLEOLUS, SYNDESMOSIS, RIGHT (Right Ankle)     Patient location during evaluation: PACU Anesthesia Type: General Level of consciousness: awake and alert Pain management: pain level controlled Vital Signs Assessment: post-procedure vital signs reviewed and stable Respiratory status: spontaneous breathing, nonlabored ventilation and respiratory function stable Cardiovascular status: blood pressure returned to baseline and stable Postop Assessment: no apparent nausea or vomiting Anesthetic complications: no    Last Vitals:  Vitals:   02/06/19 1230 02/06/19 1250  BP: (!) 157/74 (!) 154/79  Pulse: 64 66  Resp: 13 12  Temp:  36.5 C  SpO2: 97% 97%    Last Pain:  Vitals:   02/06/19 1250  TempSrc: Oral  PainSc: 0-No pain                 Lynda Rainwater

## 2019-02-06 NOTE — Anesthesia Procedure Notes (Signed)
Procedure Name: LMA Insertion Date/Time: 02/06/2019 10:16 AM Performed by: Maryella Shivers, CRNA Pre-anesthesia Checklist: Patient identified, Emergency Drugs available, Suction available and Patient being monitored Patient Re-evaluated:Patient Re-evaluated prior to induction Oxygen Delivery Method: Circle system utilized Preoxygenation: Pre-oxygenation with 100% oxygen Induction Type: IV induction Ventilation: Mask ventilation without difficulty LMA: LMA inserted LMA Size: 4.0 Number of attempts: 1 Airway Equipment and Method: Bite block Placement Confirmation: positive ETCO2 Tube secured with: Tape Dental Injury: Teeth and Oropharynx as per pre-operative assessment

## 2019-02-06 NOTE — Anesthesia Preprocedure Evaluation (Signed)
Anesthesia Evaluation  Patient identified by MRN, date of birth, ID band Patient awake    Reviewed: Allergy & Precautions, NPO status , Patient's Chart, lab work & pertinent test results  History of Anesthesia Complications (+) PONV  Airway Mallampati: II  TM Distance: >3 FB Neck ROM: Full    Dental no notable dental hx.    Pulmonary neg pulmonary ROS, sleep apnea ,    Pulmonary exam normal breath sounds clear to auscultation       Cardiovascular hypertension, Pt. on medications (-) anginaNormal cardiovascular exam Rhythm:Regular Rate:Normal  Abnormal cardiovascular stress test 08/21/2016 a. done for abnl EKG/cardiac risk factors -> admitted for cath 07/2016 which showed no significant CAD, normal LV contraction, mildly elevated filling pressure. Low dose Imdur was added for possible component of microvascular dysfunction    Neuro/Psych  Headaches, PSYCHIATRIC DISORDERS Anxiety Depression    GI/Hepatic negative GI ROS, Neg liver ROS,   Endo/Other  diabetes, Insulin DependentHypothyroidism   Renal/GU negative Renal ROS  negative genitourinary   Musculoskeletal  (+) Arthritis , Osteoarthritis,    Abdominal   Peds negative pediatric ROS (+)  Hematology negative hematology ROS (+)   Anesthesia Other Findings   Reproductive/Obstetrics negative OB ROS                             Anesthesia Physical  Anesthesia Plan  ASA: III  Anesthesia Plan: General   Post-op Pain Management:  Regional for Post-op pain   Induction: Intravenous  PONV Risk Score and Plan: 4 or greater and Ondansetron, Treatment may vary due to age or medical condition, Dexamethasone, Midazolam and Scopolamine patch - Pre-op  Airway Management Planned: LMA  Additional Equipment:   Intra-op Plan:   Post-operative Plan: Extubation in OR  Informed Consent: I have reviewed the patients History and Physical, chart, labs  and discussed the procedure including the risks, benefits and alternatives for the proposed anesthesia with the patient or authorized representative who has indicated his/her understanding and acceptance.       Plan Discussed with: CRNA, Anesthesiologist and Surgeon  Anesthesia Plan Comments: (  )        Anesthesia Quick Evaluation

## 2019-02-06 NOTE — Anesthesia Procedure Notes (Signed)
Anesthesia Regional Block: Adductor canal block   Pre-Anesthetic Checklist: ,, timeout performed, Correct Patient, Correct Site, Correct Laterality, Correct Procedure, Correct Position, site marked, Risks and benefits discussed,  Surgical consent,  Pre-op evaluation,  At surgeon's request and post-op pain management  Laterality: Right  Prep: chloraprep       Needles:  Injection technique: Single-shot  Needle Type: Stimiplex     Needle Length: 9cm  Needle Gauge: 21     Additional Needles:   Procedures:,,,, ultrasound used (permanent image in chart),,,,  Narrative:  Start time: 02/06/2019 10:03 AM End time: 02/06/2019 10:08 AM Injection made incrementally with aspirations every 5 mL.  Performed by: Personally  Anesthesiologist: Lynda Rainwater, MD

## 2019-02-06 NOTE — Progress Notes (Signed)
Assisted Dr. Miller with right, ultrasound guided, popliteal, adductor canal block. Side rails up, monitors on throughout procedure. See vital signs in flow sheet. Tolerated Procedure well. 

## 2019-02-06 NOTE — H&P (Signed)
Ashley Woodard is an 68 y.o. female.   Chief Complaint: Right ankle fracture HPI: Ashley Woodard is here today for surgical correction of her right ankle fracture.  She sustained a lateral malleolus fracture approximately week and a half ago.  She has been maintaining nonweightbearing.  Her pain is improving.  X-rays revealed some medial clear space widening and displacement of her fibula.  She has no other joint or extremity pain today.  Past Medical History:  Diagnosis Date  . Abnormal cardiovascular stress test 08/21/2016   a. done for abnl EKG/cardiac risk factors -> admitted for cath 07/2016 which showed no significant CAD, normal LV contraction, mildly elevated filling pressure. Low dose Imdur was added for possible component of microvascular dysfunction.   . Allergy   . Anxiety   . Arthritis    "back, knees, fingers" (05/10/2017)  . Chronic lower back pain   . Colon polyps   . Deafness in right ear   . Depression   . Diverticulosis   . Hx of adenomatous colonic polyps 05/08/2003  . Hypertension   . Hypertriglyceridemia   . Hypothyroidism   . Migraine    "none in the 2000s" (05/10/2017)  . Osteoporosis   . Paroxysmal atrial fibrillation (HCC)   . Pneumonia    "several times" (05/10/2017)  . PONV (postoperative nausea and vomiting)   . S/P cardiac catheterization, 08/20/16 minimal CAD 08/21/2016  . Type II diabetes mellitus ([place])   . Voice tremor     Past Surgical History:  Procedure Laterality Date  . ABDOMINAL HYSTERECTOMY  1988  . ATRIAL FIBRILLATION ABLATION N/A 05/10/2017   Procedure: ATRIAL FIBRILLATION ABLATION;  Surgeon: Thompson Grayer, MD;  Location: [place] [facility];  Service: Cardiovascular;  Laterality: N/A;  . COCHLEAR IMPLANT Right 2008  . COLONOSCOPY    . LEFT HEART CATH AND CORONARY ANGIOGRAPHY N/A 08/20/2016   Procedure: Left Heart Cath and Coronary Angiography;  Surgeon: Nelva Bush, MD;  Location: [place] [facility];  Service: Cardiovascular;  Laterality: N/A;  .  LOOP RECORDER INSERTION N/A 11/29/2017   Procedure: LOOP RECORDER INSERTION;  Surgeon: Thompson Grayer, MD;  Location: [place] [facility];  Service: Cardiovascular;  Laterality: N/A;    Family History  Problem Relation Age of Onset  . Hypertension Mother   . Diabetes Mother   . Heart attack Mother   . Colon polyps Mother   . Hypertension Father   . Heart attack Father   . Stroke Father   . Breast cancer Sister 19  . Hypertension Brother   . Cancer Brother        Prostate  . Diabetes Brother   . Dementia Brother   . Stroke Brother   . Heart attack Brother   . Prostate cancer Brother   . Hypertension Son   . Breast cancer Maternal Aunt        in 54's  . Cancer Maternal Uncle        Lung  . Colon cancer Neg Hx   . Esophageal cancer Neg Hx   . Rectal cancer Neg Hx   . Stomach cancer Neg Hx    Social History:  reports that she has never smoked. She has never used smokeless tobacco. She reports that she does not drink alcohol or use drugs.  Allergies:  Allergies  Allergen Reactions  . Demeclocycline Rash  . Doxycycline Rash  . Erythromycin Rash  . Lisinopril Cough  . Tetracyclines & Related Rash    Facility-Administered Medications Prior  to Admission  Medication Dose Route Frequency Provider Last Rate Last Admin  . 0.9 %  sodium chloride infusion  500 mL Intravenous Once Iva Boop, MD       Medications Prior to Admission  Medication Sig Dispense Refill  . amLODipine (NORVASC) 10 MG tablet TAKE 1 TABLET DAILY 90 tablet 1  . aspirin EC 81 MG tablet Take 1 tablet (81 mg total) by mouth daily. 30 tablet 0  . Cholecalciferol (VITAMIN D) 2000 units CAPS Take 1 capsule by mouth daily.     Marland Kitchen doxylamine, Sleep, (SLEEP AID) 25 MG tablet Take 25 mg by mouth at bedtime.    . DULoxetine (CYMBALTA) 30 MG capsule TAKE 1 CAPSULE DAILY 90 capsule 1  . hydrALAZINE (APRESOLINE) 50 MG tablet TAKE 1 TABLET BY MOUTH TWICE A DAY 180 tablet 0  . hydrochlorothiazide (HYDRODIURIL) 25 MG  tablet TAKE 1 TABLET DAILY 90 tablet 3  . HYDROcodone-acetaminophen (NORCO) 5-325 MG tablet Take 1 tablet by mouth every 6 (six) hours as needed. 10 tablet 0  . irbesartan (AVAPRO) 150 MG tablet TAKE 1 TABLET DAILY 90 tablet 3  . KLOR-CON M20 20 MEQ tablet TAKE 1 TABLET TWICE A DAY 180 tablet 3  . levothyroxine (SYNTHROID) 88 MCG tablet Take 1 tablet (88 mcg total) by mouth daily. 90 tablet 1  . Magnesium 200 MG TABS Take 200 mg by mouth daily.     . metFORMIN (GLUCOPHAGE) 500 MG tablet TAKE 1 TABLET TWICE DAILY  WITH MEALS 180 tablet 1  . Omega-3 Fatty Acids (FISH OIL) 1000 MG CAPS Take 1,000 mg by mouth daily.     . simvastatin (ZOCOR) 20 MG tablet Take 1 tablet (20 mg total) by mouth 3 (three) times a week. Monday, Wednesday, Friday 36 tablet 1  . vitamin B-12 (CYANOCOBALAMIN) 1000 MCG tablet Take 1 tablet (1,000 mcg total) by mouth daily. 30 tablet 0  . glucose blood (CONTOUR NEXT TEST) test strip Check blood sugars once daily 100 each 6  . LANCETS ULTRA THIN 30G MISC 1 Stick by Does not apply route daily. 100 each 6    Results for orders placed or performed during the hospital encounter of 02/06/19 (from the past 48 hour(s))  Glucose, capillary     Status: Abnormal   Collection Time: 02/06/19  9:26 AM  Result Value Ref Range   Glucose-Capillary 117 (H) 70 - 99 mg/dL   No results found.  Review of Systems  Constitutional: Negative.   HENT: Negative.   Eyes: Negative.   Respiratory: Negative.   Gastrointestinal: Negative.   Endocrine: Negative.   Musculoskeletal:       Right ankle pain and swelling  Skin: Negative.   Neurological: Negative.   Psychiatric/Behavioral: Negative.     Blood pressure 139/76, pulse 69, temperature 97.7 F (36.5 C), temperature source Temporal, resp. rate 16, height 5\' 7"  (1.702 m), weight 93.9 kg, SpO2 98 %. Physical Exam  Vitals reviewed. Constitutional: She appears well-developed.  HENT:  Head: Normocephalic.  Eyes: Conjunctivae are normal.   Cardiovascular: Normal rate.  Respiratory: Effort normal.  GI: Soft.  Musculoskeletal:     Cervical back: Neck supple.     Comments: Right ankle with swelling and ecchymosis laterally.  Slight subluxation laterally clinically.  Tenderness palpation along the fibula.  Did not assess motor function.  Did not assess motion.  No tenderness along the dorsal midfoot or forefoot.  Sensation intact in the dorsal plantar foot.  Foot is warm and well-perfused.  Neurological: She is alert.  Skin: Skin is warm.  Psychiatric: She has a normal mood and affect.     Assessment/Plan We will proceed with open treatment of her ankle fracture.  We discussed possible syndesmotic fixation versus deltoid ligament repair versus arthrotomy if I am unable to get her ankle reduced.  She understands the risk benefits alternatives of surgery which include but not limited to wound healing complications, infection, nonunion, malunion, need for further surgery.  She also understands the perioperative anesthetic risk which include death.  She is ready to proceed with surgery.  Terance Harthristopher R Azura Tufaro, MD 02/06/2019, 9:40 AM

## 2019-02-06 NOTE — Transfer of Care (Signed)
Immediate Anesthesia Transfer of Care Note  Patient: Ashley Woodard  Procedure(s) Performed: OPEN REDUCTION INTERNAL FIXATION (ORIF) ANKLE FRACTURE LATERAL MALLEOLUS, SYNDESMOSIS, RIGHT (Right Ankle)  Patient Location: PACU  Anesthesia Type:GA combined with regional for post-op pain  Level of Consciousness: sedated  Airway & Oxygen Therapy: Patient Spontanous Breathing and Patient connected to face mask oxygen  Post-op Assessment: Report given to RN and Post -op Vital signs reviewed and stable  Post vital signs: Reviewed and stable  Last Vitals:  Vitals Value Taken Time  BP 124/66 02/06/19 1139  Temp    Pulse 67 02/06/19 1140  Resp 12 02/06/19 1140  SpO2 98 % 02/06/19 1140  Vitals shown include unvalidated device data.  Last Pain:  Vitals:   02/06/19 0912  TempSrc: Temporal  PainSc: 1       Patients Stated Pain Goal: 1 (62/[id number]/76 [id number])  Complications: No apparent anesthesia complications

## 2019-02-06 NOTE — Anesthesia Procedure Notes (Signed)
Anesthesia Regional Block: Popliteal block   Pre-Anesthetic Checklist: ,, timeout performed, Correct Patient, Correct Site, Correct Laterality, Correct Procedure, Correct Position, site marked, Risks and benefits discussed,  Surgical consent,  Pre-op evaluation,  At surgeon's request and post-op pain management  Laterality: Right  Prep: chloraprep       Needles:  Injection technique: Single-shot  Needle Type: Stimiplex     Needle Length: 9cm  Needle Gauge: 21     Additional Needles:   Procedures:,,,, ultrasound used (permanent image in chart),,,,  Narrative:  Start time: 02/06/2019 10:04 AM End time: 02/06/2019 10:09 AM Injection made incrementally with aspirations every 5 mL.  Performed by: Personally  Anesthesiologist: Lynda Rainwater, MD

## 2019-02-07 ENCOUNTER — Encounter: Payer: Self-pay | Admitting: *Deleted

## 2019-02-09 NOTE — Op Note (Signed)
Ashley Woodard female 68 y.o. 02/06/2019  PreOperative Diagnosis: Right ankle fracture  PostOperative Diagnosis: Right lateral malleolus fracture Syndesmotic ligament disruption  PROCEDURE: Open reduction internal fixation of right lateral malleolus Open reduction internal fixation of syndesmosis  SURGEON: Dub Mikes, MD  ASSISTANT: None  ANESTHESIA: General LMA with peripheral nerve blockade  FINDINGS: Displaced lateral malleolus fracture with avulsion of the anterior inferior tibiofibular ligament  IMPLANTS: Arthrex distal fibular locking plate 2.7 mm fully threaded screws  INDICATIONS:68 y.o. female sustained the above injury approximately 1-1/2 weeks ago.  She had a fall.  She had pain and swelling and was seen in the emergency department and diagnosed with a displaced lateral malleolus fracture.  She was seen in the office and there was medial clear space widening with concern for syndesmotic or medial deltoid ligament disruption and ankle instability.  She was indicated for open treatment.  She understood the risk benefits alternatives of surgery which include but are not limited to wound healing complications, infection, nonunion, malunion, need for further surgery and continued pain.  She also understood the possibility of development of posttraumatic arthritis.  She understood the postoperative weightbearing restrictions as well as the perioperative anesthetic risk which include death.  She was to proceed with surgery.  PROCEDURE:Patient was identified the preoperative holding area.  The right leg was marked myself.  Consent was signed myself and the patient.  Peripheral nerve block was performed by anesthesia.  Patient was taken to the operative suite and placed supine on the operative table.  General anesthesia was induced out difficulty.  Preoperative antibiotics were given.  Thigh tourniquet was placed on the left thigh and a bump was placed under the left hip. Bone  foam was used.  All bony prominences well-padded.  Surgical timeout was performed.  The operative extremity was then elevated and the tourniquet inflated to 250 mmHg.  We began by making a longitudinal incision overlying the fibula and fracture.  This was taken sharply down through skin and subcutaneous tissue.  Blunt dissection was used to identify the superficial peroneal nerve which was found and retracted and protected to the entire to the case.  We then carried the incision down to bone.  Subperiosteal flaps were created anteriorly and posteriorly about the distal fibula fracture.  There was displacement there.  The fracture ends were cleared off and mobilized.  Then using a rondure the fracture site was cleaned.  There is a large posterior spike that was also identified and cleaned off.  At that time it was identified that there was an anterior fragment off of the distal fibula at the site of the AITF L.  This was syndesmotic and it was unstable.  The distal fibular fracture was then reduced under direct visualization and held with a lobster claw clamp.  Then a single 2.7 mm lag screw was placed in a lag by technique fashion.  Fluoroscopy confirmed appropriate reduction of the fibula.  Then a combination of locking and nonlocking screws and a locking distal fibular plate was placed.  This was good for maintenance of fixation.  We then assessed the syndesmosis and it was found to be loose due to the avulsion of the anterior bony fragment.  This was then reduced under direct visualization and held provisionally with a pointed reduction clamp.  2.7 millimeter screw was then placed across this to reaffixed the AITF L to the fibula.  This completed the open treatment of the syndesmosis portion of the case.  We then  irrigated the wounds copiously.  Fluoroscopy was used to again ensure appropriate screw length and plate placement.  The ankle was gently stressed and found to be negative with regard to medial clear  space widening or syndesmotic widening at that point.  The wound was then irrigated copiously with normal saline.  The deep tissue was closed with a 2-0 PDS suture overlying the plate.  The tourniquet was released and hemostasis was obtained.  We then closed the subcuticular tissue with 3-0 Monocryl and the skin with 3-0 nylon.  She was placed in a soft dressing including Xeroform, 4 x 4's and sterile cast padding.  A short leg nonweightbearing splint was placed.  All counts were correct at the end the case.  She was awakened from anesthesia and taken recovery in stable condition.  There were no complications.  POST OPERATIVE INSTRUCTIONS: Nonweightbearing to right lower extremity Keep splint dry and intact Continue aspirin for DVT prophylaxis She will follow-up in 2 weeks for wound check, suture removal and x-rays.   BLOOD LOSS:  Minimal         DRAINS: none         SPECIMEN: none       COMPLICATIONS:  * No complications entered in OR log *         Disposition: PACU - hemodynamically stable.         Condition: stable

## 2019-02-15 ENCOUNTER — Other Ambulatory Visit: Payer: Self-pay | Admitting: Physician Assistant

## 2019-02-15 DIAGNOSIS — E7849 Other hyperlipidemia: Secondary | ICD-10-CM

## 2019-02-19 ENCOUNTER — Encounter: Payer: Self-pay | Admitting: Internal Medicine

## 2019-02-19 ENCOUNTER — Telehealth (INDEPENDENT_AMBULATORY_CARE_PROVIDER_SITE_OTHER): Payer: Medicare Other | Admitting: Internal Medicine

## 2019-02-19 VITALS — BP 130/78 | Ht 67.0 in | Wt 207.0 lb

## 2019-02-19 DIAGNOSIS — I10 Essential (primary) hypertension: Secondary | ICD-10-CM | POA: Diagnosis not present

## 2019-02-19 DIAGNOSIS — I480 Paroxysmal atrial fibrillation: Secondary | ICD-10-CM

## 2019-02-19 NOTE — Progress Notes (Signed)
Electrophysiology TeleHealth Note   Due to national recommendations of social distancing due to COVID 19, an audio/video telehealth visit is felt to be most appropriate for this patient at this time.  See MyChart message from today for the patient's consent to telehealth for [facility].   Date:  02/19/2019   ID:  Ashley Woodard, DOB March 29, 1950, MRN [id number]  Location: patient's home  Provider location:  [facility]  Evaluation Performed: Follow-up visit  PCP:  Brunetta Jeans, PA-C   Electrophysiologist:  Dr Rayann Heman  Chief Complaint:  palpitations  History of Present Illness:    Ashley Woodard is a 68 y.o. female who presents via telehealth conferencing today.  Since last being seen in our clinic, the patient reports doing very well.  She fell before Christmas and broke her ankle.  No afib.  Today, she denies symptoms of palpitations, chest pain, shortness of breath,  lower extremity edema, dizziness, presyncope, or syncope.  The patient is otherwise without complaint today.  The patient denies symptoms of fevers, chills, cough, or new SOB worrisome for COVID 19.  Past Medical History:  Diagnosis Date  . Abnormal cardiovascular stress test 08/21/2016   a. done for abnl EKG/cardiac risk factors -> admitted for cath 07/2016 which showed no significant CAD, normal LV contraction, mildly elevated filling pressure. Low dose Imdur was added for possible component of microvascular dysfunction.   . Allergy   . Anxiety   . Arthritis    "back, knees, fingers" (05/10/2017)  . Chronic lower back pain   . Colon polyps   . Deafness in right ear   . Depression   . Diverticulosis   . Hx of adenomatous colonic polyps 05/08/2003  . Hypertension   . Hypertriglyceridemia   . Hypothyroidism   . Migraine    "none in the 2000s" (05/10/2017)  . Osteoporosis   . Paroxysmal atrial fibrillation (HCC)   . Pneumonia    "several times" (05/10/2017)  . PONV (postoperative nausea and vomiting)   .  S/P cardiac catheterization, 08/20/16 minimal CAD 08/21/2016  . Type II diabetes mellitus ([place])   . Voice tremor     Past Surgical History:  Procedure Laterality Date  . ABDOMINAL HYSTERECTOMY  1988  . ATRIAL FIBRILLATION ABLATION N/A 05/10/2017   Procedure: ATRIAL FIBRILLATION ABLATION;  Surgeon: Thompson Grayer, MD;  Location: [place] [facility];  Service: Cardiovascular;  Laterality: N/A;  . COCHLEAR IMPLANT Right 2008  . COLONOSCOPY    . LEFT HEART CATH AND CORONARY ANGIOGRAPHY N/A 08/20/2016   Procedure: Left Heart Cath and Coronary Angiography;  Surgeon: Nelva Bush, MD;  Location: [place] [facility];  Service: Cardiovascular;  Laterality: N/A;  . LOOP RECORDER INSERTION N/A 11/29/2017   Procedure: LOOP RECORDER INSERTION;  Surgeon: Thompson Grayer, MD;  Location: [place] [facility];  Service: Cardiovascular;  Laterality: N/A;  . ORIF ANKLE FRACTURE Right 02/06/2019   Procedure: OPEN REDUCTION INTERNAL FIXATION (ORIF) ANKLE FRACTURE LATERAL MALLEOLUS, SYNDESMOSIS, RIGHT;  Surgeon: Erle Crocker, MD;  Location: [place];  Service: Orthopedics;  Laterality: Right;  SURGERY REQUEST TIME: 1.5 HOURS  CPT CODES: 37858, 27829, 85027, 27610    Current Outpatient Medications  Medication Sig Dispense Refill  . amLODipine (NORVASC) 10 MG tablet TAKE 1 TABLET DAILY 90 tablet 1  . aspirin EC 81 MG tablet Take 1 tablet (81 mg total) by mouth daily. 30 tablet 0  . Cholecalciferol (VITAMIN D) 2000 units CAPS Take 1 capsule  by mouth daily.     Marland Kitchen. doxylamine, Sleep, (SLEEP AID) 25 MG tablet Take 25 mg by mouth at bedtime.    . DULoxetine (CYMBALTA) 30 MG capsule TAKE 1 CAPSULE DAILY 90 capsule 1  . glucose blood (CONTOUR NEXT TEST) test strip Check blood sugars once daily 100 each 6  . hydrALAZINE (APRESOLINE) 50 MG tablet TAKE 1 TABLET BY MOUTH TWICE A DAY 180 tablet 1  . hydrochlorothiazide (HYDRODIURIL) 25 MG tablet TAKE 1 TABLET DAILY 90 tablet 3  .  HYDROcodone-acetaminophen (NORCO) 5-325 MG tablet Take 1 tablet by mouth every 6 (six) hours as needed. 10 tablet 0  . irbesartan (AVAPRO) 150 MG tablet TAKE 1 TABLET DAILY 90 tablet 3  . KLOR-CON M20 20 MEQ tablet TAKE 1 TABLET TWICE A DAY 180 tablet 3  . LANCETS ULTRA THIN 30G MISC 1 Stick by Does not apply route daily. 100 each 6  . levothyroxine (SYNTHROID) 88 MCG tablet Take 1 tablet (88 mcg total) by mouth daily. 90 tablet 1  . Magnesium 200 MG TABS Take 200 mg by mouth daily.     . metFORMIN (GLUCOPHAGE) 500 MG tablet TAKE 1 TABLET TWICE DAILY  WITH MEALS 180 tablet 1  . Omega-3 Fatty Acids (FISH OIL) 1000 MG CAPS Take 1,000 mg by mouth daily.     . simvastatin (ZOCOR) 20 MG tablet TAKE 1 TABLET MONDAY,      WEDNESDAY, FRIDAY 36 tablet 1  . vitamin B-12 (CYANOCOBALAMIN) 1000 MCG tablet Take 1 tablet (1,000 mcg total) by mouth daily. 30 tablet 0   Current Facility-Administered Medications  Medication Dose Route Frequency Provider Last Rate Last Admin  . 0.9 %  sodium chloride infusion  500 mL Intravenous Once Iva BoopGessner, Carl E, MD        Allergies:   Demeclocycline, Doxycycline, Erythromycin, Lisinopril, and Tetracyclines & related   Social History:  The patient  reports that she has never smoked. She has never used smokeless tobacco. She reports that she does not drink alcohol or use drugs.   Family History:  The patient's family history includes Breast cancer in her maternal aunt; Breast cancer (age of onset: 165) in her sister; Cancer in her brother and maternal uncle; Colon polyps in her mother; Dementia in her brother; Diabetes in her brother and mother; Heart attack in her brother, father, and mother; Hypertension in her brother, father, mother, and son; Prostate cancer in her brother; Stroke in her brother and father.   ROS:  Please see the history of present illness.   All other systems are personally reviewed and negative.    Exam:    Vital Signs:  BP 130/78   Ht 5\' 7"  (1.702  m)   Wt 207 lb (93.9 kg)   BMI 32.42 kg/m   Well sounding and appearing, alert and conversant, regular work of breathing,  good skin color Eyes- anicteric, neuro- grossly intact, skin- no apparent rash or lesions or cyanosis, mouth- oral mucosa is pink  Labs/Other Tests and Data Reviewed:    Recent Labs: 01/05/2019: ALT 14; Hemoglobin 12.8; Platelets 375; TSH 4.59 02/02/2019: BUN 14; Creatinine, Ser 0.86; Potassium 4.6; Sodium 138   Wt Readings from Last 3 Encounters:  02/19/19 207 lb (93.9 kg)  02/06/19 207 lb (93.9 kg)  01/05/19 207 lb (93.9 kg)     Last device remote is reviewed from P[employer] PDF which reveals normal device function, no arrhythmias    ASSESSMENT & PLAN:    1.  Paroxysmal atrial  fibrillation No afib post ablation off AAD therapy 0% afib by ILR She is off of coumadin.  chads2vasc score is 4.  If she has further afib, we will resume anticoagulation  2. HTN Stable No change required today   Follow-up:  Remotes, return to see me in a year   Patient Risk:  after full review of this patients clinical status, I feel that they are at moderate risk at this time.  Today, I have spent 15 minutes with the patient with telehealth technology discussing arrhythmia management .    Randolm Idol, MD  02/19/2019 12:06 PM     [facility] [address] [place] [address] [phone] (office) [phone] (fax)

## 2019-03-02 ENCOUNTER — Other Ambulatory Visit: Payer: Self-pay | Admitting: Physician Assistant

## 2019-03-06 ENCOUNTER — Ambulatory Visit (INDEPENDENT_AMBULATORY_CARE_PROVIDER_SITE_OTHER): Payer: Medicare Other | Admitting: *Deleted

## 2019-03-06 DIAGNOSIS — I480 Paroxysmal atrial fibrillation: Secondary | ICD-10-CM

## 2019-03-06 LAB — CUP PACEART REMOTE DEVICE CHECK
Date Time Interrogation Session: 20210112130817
Implantable Pulse Generator Implant Date: 20191008

## 2019-03-06 NOTE — Progress Notes (Signed)
ILR remote 

## 2019-03-14 ENCOUNTER — Ambulatory Visit: Payer: Medicare Other | Admitting: Physician Assistant

## 2019-03-15 ENCOUNTER — Ambulatory Visit (INDEPENDENT_AMBULATORY_CARE_PROVIDER_SITE_OTHER): Payer: Medicare Other | Admitting: Physician Assistant

## 2019-03-15 ENCOUNTER — Encounter: Payer: Self-pay | Admitting: Physician Assistant

## 2019-03-15 ENCOUNTER — Ambulatory Visit (INDEPENDENT_AMBULATORY_CARE_PROVIDER_SITE_OTHER): Payer: Medicare Other

## 2019-03-15 ENCOUNTER — Other Ambulatory Visit: Payer: Self-pay

## 2019-03-15 DIAGNOSIS — L299 Pruritus, unspecified: Secondary | ICD-10-CM | POA: Diagnosis not present

## 2019-03-15 MED ORDER — HYDROXYZINE HCL 10 MG PO TABS: 10.0000 mg | ORAL_TABLET | Freq: Three times a day (TID) | ORAL | 0 refills | Status: DC | PRN

## 2019-03-15 NOTE — Progress Notes (Signed)
Virtual Visit via Video   I connected with patient on 03/15/19 at  9:30 AM EST by a video enabled telemedicine application and verified that I am speaking with the correct person using two identifiers.  Location patient: Home Location provider: [facility], Office Persons participating in the virtual visit: Patient, Provider, [place] (Patina Moore)  I discussed the limitations of evaluation and management by telemedicine and the availability of in person appointments. The patient expressed understanding and agreed to proceed.  Subjective:   HPI:   Patient presents via Doxy[employer]Me today c/o body pruritus over the past couple of weeks.  Notes symptoms started after she had ankle surgery.  Was given pain medication after her procedure but did not take.  Patient denies rash, blistering, erythema of skin.  Itching is from head to toe but more concentrated around her torso.  Patient denies changes to soaps, lotions, detergents or other products used on the body.  Denies recent travel or sick contact with similar symptoms.  Only change in medication recently was increase in levothyroxine from 75 mcg daily to 88 mcg daily.  Patient denies any abdominal pain, nausea, vomiting, dark urine or light stools.  Denies any yellowing of the skin.  Denies xerosis of the skin.  Denies change in bowel habits overall at present.  Denies hair loss.  ROS:   See pertinent positives and negatives per HPI.  Patient Active Problem List   Diagnosis Date Noted  . Paroxysmal atrial fibrillation ([place]) 05/10/2017  . Hypothyroidism 02/24/2017  . 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 S/P cardiac catheterization, 08/20/16 minimal CAD  08/21/2016  . Unstable angina ([place])   . DDD (degenerative disc disease), lumbar 06/29/2016  . Meniere's disease 06/29/2016  . Diabetes mellitus, type 2 ([place]) 09/30/2014  . Hyperlipemia 09/30/2014  . Hypertension 09/30/2014  . Hx of adenomatous colonic polyps 05/08/2003    Social History    Tobacco Use  . Smoking status: Never Smoker  . Smokeless tobacco: Never Used  Substance Use Topics  . Alcohol use: No    Current Outpatient Medications:  .  amLODipine (NORVASC) 10 MG tablet, TAKE 1 TABLET DAILY, Disp: 90 tablet, Rfl: 1 .  aspirin EC 81 MG tablet, Take 1 tablet (81 mg total) by mouth daily., Disp: 30 tablet, Rfl: 0 .  Cholecalciferol (VITAMIN D) 2000 units CAPS, Take 1 capsule by mouth daily. , Disp: , Rfl:  .  doxylamine, Sleep, (SLEEP AID) 25 MG tablet, Take 25 mg by mouth at bedtime., Disp: , Rfl:  .  DULoxetine (CYMBALTA) 30 MG capsule, TAKE 1 CAPSULE DAILY, Disp: 90 capsule, Rfl: 1 .  glucose blood (CONTOUR NEXT TEST) test strip, Check blood sugars once daily, Disp: 100 each, Rfl: 6 .  hydrALAZINE (APRESOLINE) 50 MG tablet, TAKE 1 TABLET BY MOUTH TWICE A DAY, Disp: 180 tablet, Rfl: 1 .  hydrochlorothiazide (HYDRODIURIL) 25 MG tablet, TAKE 1 TABLET DAILY, Disp: 90 tablet, Rfl: 3 .  HYDROcodone-acetaminophen (NORCO) 5-325 MG tablet, Take 1 tablet by mouth every 6 (six) hours as needed., Disp: 10 tablet, Rfl: 0 .  irbesartan (AVAPRO) 150 MG tablet, TAKE 1 TABLET DAILY, Disp: 90 tablet, Rfl: 3 .  KLOR-CON M20 20 MEQ tablet, TAKE 1 TABLET TWICE A DAY, Disp: 180 tablet, Rfl: 3 .  LANCETS ULTRA THIN 30G MISC, 1 Stick by Does not apply route daily., Disp: 100 each, Rfl: 6 .  levothyroxine (SYNTHROID) 88 MCG tablet, Take 1 tablet (88 mcg total) by mouth daily., Disp: 90 tablet, Rfl:  1 .  Magnesium 200 MG TABS, Take 200 mg by mouth daily. , Disp: , Rfl:  .  metFORMIN (GLUCOPHAGE) 500 MG tablet, TAKE 1 TABLET TWICE DAILY  WITH MEALS, Disp: 180 tablet, Rfl: 1 .  Omega-3 Fatty Acids (FISH OIL) 1000 MG CAPS, Take 1,000 mg by mouth daily. , Disp: , Rfl:  .  simvastatin (ZOCOR) 20 MG tablet, TAKE 1 TABLET MONDAY,      WEDNESDAY, FRIDAY, Disp: 36 tablet, Rfl: 1 .  vitamin B-12 (CYANOCOBALAMIN) 1000 MCG tablet, Take 1 tablet (1,000 mcg total) by mouth daily., Disp: 30 tablet, Rfl:  0  Current Facility-Administered Medications:  .  0.9 %  sodium chloride infusion, 500 mL, Intravenous, Once, Gatha Mayer, MD  Allergies  Allergen Reactions  . Demeclocycline Rash  . Doxycycline Rash  . Erythromycin Rash  . Lisinopril Cough  . Tetracyclines & Related Rash    Objective:   There were no vitals taken for this visit.  Patient is well-developed, well-nourished in no acute distress.  Resting comfortably at home.  Head is normocephalic, atraumatic.  No labored breathing.  Speech is clear and coherent with logical content.  Patient is alert and oriented at baseline.  Video examination negative for jaundice of skin or scleral icterus.  Assessment and Plan:   1. Pruritus Unknown etiology.  No noted rash.  Supportive measures reviewed along with OTC medications and antiitch creams.  Rx hydroxyzine 10 mg.  Take 1 to 2 tablets up to 3 times daily.  We will get her in later today to check CBC, c-Met and TSH levels.  Will alter regimen according to results.  If unremarkable and symptoms are not improving, may need dermatology assessment.  - hydrOXYzine (ATARAX/VISTARIL) 10 MG tablet; Take 1 tablet (10 mg total) by mouth 3 (three) times daily as needed.  Dispense: 60 tablet; Refill: 0 - CBC with Differential/Platelet; Future - Comprehensive metabolic panel; Future - TSH; Future    Leeanne Rio, PA-C 03/15/2019

## 2019-03-15 NOTE — Progress Notes (Signed)
I have discussed the procedure for the virtual visit with the patient who has given consent to proceed with assessment and treatment.   Ashley Woodard, CMA     

## 2019-03-15 NOTE — Patient Instructions (Addendum)
Instructions sent to MyChart.  Please keep well-hydrated.  Rest.  Try to avoid direct sunlight or hot temperatures as much as possible.  Use Sarna lotion OTC for itch.  Can also use topical Benadryl cream. Please take the hydroxyzine as directed for itch.  We will see you later this afternoon for labs.  This way we can narrow down causes of symptoms.  We will alter your regimen accordingly. Please report any new or worsening symptoms ASAP through MyChart or via phone.

## 2019-03-16 LAB — CBC WITH DIFFERENTIAL/PLATELET
Basophils Absolute: 0.1 10*3/uL (ref 0.0–0.1)
Basophils Relative: 1.1 % (ref 0.0–3.0)
Eosinophils Absolute: 0.3 10*3/uL (ref 0.0–0.7)
Eosinophils Relative: 3.3 % (ref 0.0–5.0)
HCT: 39.3 % (ref 36.0–46.0)
Hemoglobin: 13 g/dL (ref 12.0–15.0)
Lymphocytes Relative: 29.7 % (ref 12.0–46.0)
Lymphs Abs: 2.6 10*3/uL (ref 0.7–4.0)
MCHC: 33.1 g/dL (ref 30.0–36.0)
MCV: 85 fl (ref 78.0–100.0)
Monocytes Absolute: 0.6 10*3/uL (ref 0.1–1.0)
Monocytes Relative: 7.2 % (ref 3.0–12.0)
Neutro Abs: 5.1 10*3/uL (ref 1.4–7.7)
Neutrophils Relative %: 58.7 % (ref 43.0–77.0)
Platelets: 338 10*3/uL (ref 150.0–400.0)
RBC: 4.63 Mil/uL (ref 3.87–5.11)
RDW: 15.1 % (ref 11.5–15.5)
WBC: 8.8 10*3/uL (ref 4.0–10.5)

## 2019-03-16 LAB — COMPREHENSIVE METABOLIC PANEL
ALT: 15 U/L (ref 0–35)
AST: 12 U/L (ref 0–37)
Albumin: 4.2 g/dL (ref 3.5–5.2)
Alkaline Phosphatase: 84 U/L (ref 39–117)
BUN: 14 mg/dL (ref 6–23)
CO2: 24 mEq/L (ref 19–32)
Calcium: 9.8 mg/dL (ref 8.4–10.5)
Chloride: 104 mEq/L (ref 96–112)
Creatinine, Ser: 0.88 mg/dL (ref 0.40–1.20)
GFR: 63.93 mL/min (ref >60.00)
Glucose, Bld: 129 mg/dL — ABNORMAL HIGH (ref 70–99)
Potassium: 3.8 mEq/L (ref 3.5–5.1)
Sodium: 140 mEq/L (ref 135–145)
Total Bilirubin: 0.4 mg/dL (ref 0.2–1.2)
Total Protein: 6.4 g/dL (ref 6.0–8.3)

## 2019-03-16 LAB — TSH: TSH: 2.21 u[IU]/mL (ref 0.35–4.50)

## 2019-03-19 DIAGNOSIS — S8251XA Displaced fracture of medial malleolus of right tibia, initial encounter for closed fracture: Secondary | ICD-10-CM | POA: Diagnosis not present

## 2019-03-22 DIAGNOSIS — R262 Difficulty in walking, not elsewhere classified: Secondary | ICD-10-CM | POA: Diagnosis not present

## 2019-03-22 DIAGNOSIS — M25671 Stiffness of right ankle, not elsewhere classified: Secondary | ICD-10-CM | POA: Diagnosis not present

## 2019-03-28 DIAGNOSIS — M25671 Stiffness of right ankle, not elsewhere classified: Secondary | ICD-10-CM | POA: Diagnosis not present

## 2019-03-28 DIAGNOSIS — R262 Difficulty in walking, not elsewhere classified: Secondary | ICD-10-CM | POA: Diagnosis not present

## 2019-03-30 DIAGNOSIS — M25671 Stiffness of right ankle, not elsewhere classified: Secondary | ICD-10-CM | POA: Diagnosis not present

## 2019-03-30 DIAGNOSIS — R262 Difficulty in walking, not elsewhere classified: Secondary | ICD-10-CM | POA: Diagnosis not present

## 2019-04-02 DIAGNOSIS — R262 Difficulty in walking, not elsewhere classified: Secondary | ICD-10-CM | POA: Diagnosis not present

## 2019-04-02 DIAGNOSIS — M25671 Stiffness of right ankle, not elsewhere classified: Secondary | ICD-10-CM | POA: Diagnosis not present

## 2019-04-09 ENCOUNTER — Encounter: Payer: Self-pay | Admitting: Physician Assistant

## 2019-04-09 DIAGNOSIS — E785 Hyperlipidemia, unspecified: Secondary | ICD-10-CM

## 2019-04-10 MED ORDER — LOVASTATIN 20 MG PO TABS: 20.0000 mg | ORAL_TABLET | Freq: Every day | ORAL | 3 refills | Status: DC

## 2019-04-16 DIAGNOSIS — M19072 Primary osteoarthritis, left ankle and foot: Secondary | ICD-10-CM | POA: Diagnosis not present

## 2019-04-16 DIAGNOSIS — M25561 Pain in right knee: Secondary | ICD-10-CM | POA: Diagnosis not present

## 2019-04-16 DIAGNOSIS — M25671 Stiffness of right ankle, not elsewhere classified: Secondary | ICD-10-CM | POA: Diagnosis not present

## 2019-04-17 ENCOUNTER — Other Ambulatory Visit: Payer: Self-pay | Admitting: Internal Medicine

## 2019-04-18 DIAGNOSIS — R262 Difficulty in walking, not elsewhere classified: Secondary | ICD-10-CM | POA: Diagnosis not present

## 2019-04-18 DIAGNOSIS — M25671 Stiffness of right ankle, not elsewhere classified: Secondary | ICD-10-CM | POA: Diagnosis not present

## 2019-04-22 ENCOUNTER — Ambulatory Visit: Payer: Medicare Other | Attending: Internal Medicine

## 2019-04-22 DIAGNOSIS — Z23 Encounter for immunization: Secondary | ICD-10-CM

## 2019-04-22 NOTE — Progress Notes (Signed)
   Covid-19 Vaccination Clinic  Name:  Ashley Woodard    MRN: [id number] DOB: 04-11-1950  04/22/2019  Ashley Woodard was observed post Covid-19 immunization for 15 minutes without incidence. She was provided with Vaccine Information Sheet and instruction to access the V-Safe system.   Ashley Woodard was instructed to call 911 with any severe reactions post vaccine: Marland Kitchen Difficulty breathing  . Swelling of your face and throat  . A fast heartbeat  . A bad rash all over your body  . Dizziness and weakness    Immunizations Administered    Name Date Dose VIS Date Route   Pfizer COVID-19 Vaccine 04/22/2019 10:31 AM 0.3 mL 02/02/2019 Intramuscular   Manufacturer: [employer], [employer]   Lot: [id number]   NDC: [address]-7

## 2019-05-10 ENCOUNTER — Ambulatory Visit (INDEPENDENT_AMBULATORY_CARE_PROVIDER_SITE_OTHER): Payer: Medicare Other | Admitting: *Deleted

## 2019-05-10 DIAGNOSIS — I480 Paroxysmal atrial fibrillation: Secondary | ICD-10-CM

## 2019-05-10 LAB — CUP PACEART REMOTE DEVICE CHECK
Date Time Interrogation Session: 20210318012801
Implantable Pulse Generator Implant Date: 20191008

## 2019-05-10 NOTE — Progress Notes (Signed)
ILR Remote 

## 2019-05-14 ENCOUNTER — Ambulatory Visit: Payer: Medicare Other | Attending: Internal Medicine

## 2019-05-14 DIAGNOSIS — Z23 Encounter for immunization: Secondary | ICD-10-CM

## 2019-05-14 NOTE — Progress Notes (Signed)
   Covid-19 Vaccination Clinic  Name:  Ashley Woodard    MRN: [id number] DOB: 1950-03-09  05/14/2019  [name] was observed post Covid-19 immunization for 15 minutes without incident. She was provided with Vaccine Information Sheet and instruction to access the V-Safe system.   [name] was instructed to call 911 with any severe reactions post vaccine: Marland Kitchen Difficulty breathing  . Swelling of face and throat  . A fast heartbeat  . A bad rash all over body  . Dizziness and weakness   Immunizations Administered    Name Date Dose VIS Date Route   Pfizer COVID-19 Vaccine 05/14/2019 10:05 AM 0.3 mL 02/02/2019 Intramuscular   Manufacturer: [employer], [employer]   Lot: [id number]   NDC: [address]-6

## 2019-05-28 ENCOUNTER — Telehealth: Payer: Self-pay | Admitting: Physician Assistant

## 2019-05-28 NOTE — Progress Notes (Signed)
  Chronic Care Management   Note  05/28/2019 Name: TYLAR MERENDINO MRN: [id number] DOB: 1950/09/23  Crecencio Mc Bango is a 69 y.o. year old female who is a primary care patient of Noel Journey. I reached out to [employer] by phone today in response to a referral sent by Ms. Crecencio Mc Schrag's PCP, Waldon Merl, PA-C.   Ms. Doxtater was given information about Chronic Care Management services today including:  1. CCM service includes personalized support from designated clinical staff supervised by her physician, including individualized plan of care and coordination with other care providers 2. 24/7 contact phone numbers for assistance for urgent and routine care needs. 3. Service will only be billed when office clinical staff spend 20 minutes or more in a month to coordinate care. 4. Only one practitioner may furnish and bill the service in a calendar month. 5. The patient may stop CCM services at any time (effective at the end of the month) by phone call to the office staff.   Patient agreed to services and verbal consent obtained.   Follow up plan:   Lynnae January [employer] Scheduler

## 2019-06-05 ENCOUNTER — Other Ambulatory Visit: Payer: Self-pay | Admitting: General Practice

## 2019-06-05 DIAGNOSIS — E039 Hypothyroidism, unspecified: Secondary | ICD-10-CM

## 2019-06-05 DIAGNOSIS — I10 Essential (primary) hypertension: Secondary | ICD-10-CM

## 2019-06-05 DIAGNOSIS — E785 Hyperlipidemia, unspecified: Secondary | ICD-10-CM

## 2019-06-06 ENCOUNTER — Ambulatory Visit: Payer: Medicare Other

## 2019-06-06 ENCOUNTER — Telehealth: Payer: Self-pay

## 2019-06-06 NOTE — Telephone Encounter (Signed)
Outreach for Chronic Care Management Telephone Visit with pharmacist unsuccessful. Left message for patient to return call to [phone].

## 2019-06-06 NOTE — Patient Instructions (Addendum)
isit Information  Goals Addressed   None    Ashley Woodard was given information about Chronic Care Management services today including:  1. CCM service includes personalized support from designated clinical staff supervised by her physician, including individualized plan of care and coordination with other care providers 2. 24/7 contact phone numbers for assistance for urgent and routine care needs. 3. Standard insurance, coinsurance, copays and deductibles apply for chronic care management only during months in which we provide at least 20 minutes of these services. Most insurances cover these services at 100%, however patients may be responsible for any copay, coinsurance and/or deductible if applicable. This service may help you avoid the need for more expensive face-to-face services. 4. Only one practitioner may furnish and bill the service in a calendar month. 5. The patient may stop CCM services at any time (effective at the end of the month) by phone call to the office staff.  Patient agreed to services and verbal consent obtained.     See next appointment with "Care Management Staff" under "What's Next" below.   Thank you!  Ashley Woodard, Pharm.D. Clinical Pharmacist  Primary Care at [facility] [phone]

## 2019-06-06 NOTE — Progress Notes (Unsigned)
Chronic Care Management Pharmacy  Name: Ashley Woodard  MRN: [id number] DOB: [birth year]  Chief Complaint/ HPI Ashley Woodard,  69 y.o. , female presents for their Initial CCM visit with the clinical pharmacist via telephone due to COVID-19 Pandemic.  PCP : Waldon Merl, PA-C  Their chronic conditions include: HTN, HLD, depression, HoTR  Office Visits: 11/13: (AWV): no medication changes   Consult Visit: -12/15 (ED): fracture of distal end of right fibula; hydrocodone-acetaminophen 5-325 given  Medications: Outpatient Encounter Medications as of 06/06/2019  Medication Sig  . amLODipine (NORVASC) 10 MG tablet TAKE 1 TABLET DAILY  . aspirin EC 81 MG tablet Take 1 tablet (81 mg total) by mouth daily.  . Cholecalciferol (VITAMIN D) 2000 units CAPS Take 1 capsule by mouth daily.   Marland Kitchen doxylamine, Sleep, (SLEEP AID) 25 MG tablet Take 25 mg by mouth at bedtime.  . DULoxetine (CYMBALTA) 30 MG capsule TAKE 1 CAPSULE DAILY  . glucose blood (CONTOUR NEXT TEST) test strip Check blood sugars once daily  . hydrALAZINE (APRESOLINE) 50 MG tablet TAKE 1 TABLET BY MOUTH TWICE A DAY  . hydrochlorothiazide (HYDRODIURIL) 25 MG tablet TAKE 1 TABLET DAILY  . hydrOXYzine (ATARAX/VISTARIL) 10 MG tablet Take 1 tablet (10 mg total) by mouth 3 (three) times daily as needed.  . irbesartan (AVAPRO) 150 MG tablet TAKE 1 TABLET DAILY  . KLOR-CON M20 20 MEQ tablet TAKE 1 TABLET TWICE A DAY  . LANCETS ULTRA THIN 30G MISC 1 Stick by Does not apply route daily.  Marland Kitchen levothyroxine (SYNTHROID) 88 MCG tablet Take 1 tablet (88 mcg total) by mouth daily.  Marland Kitchen lovastatin (MEVACOR) 20 MG tablet Take 1 tablet (20 mg total) by mouth at bedtime.  . Magnesium 200 MG TABS Take 200 mg by mouth daily.   . metFORMIN (GLUCOPHAGE) 500 MG tablet TAKE 1 TABLET TWICE DAILY  WITH MEALS  . Omega-3 Fatty Acids (FISH OIL) 1000 MG CAPS Take 1,000 mg by mouth daily.   . vitamin B-12 (CYANOCOBALAMIN) 1000 MCG tablet Take 1 tablet (1,000 mcg total)  by mouth daily.   No facility-administered encounter medications on file as of 06/06/2019.   Current Diagnosis/Assessment:  Goals Addressed   None    Diabetes   Recent Relevant Labs: Lab Results  Component Value Date/Time   HGBA1C 6.5 (H) 01/05/2019 03:12 PM   HGBA1C 6.1 04/28/2018 10:36 AM   HGBA1C 6.8 04/12/2016 12:00 AM   MICROALBUR 0.87 04/12/2016 12:00 AM  Patient is currently controlled on the following medications: metformin 500 mg twice daily with meals.   Last diabetic eye exam:  Lab Results  Component Value Date/Time   HMDIABEYEEXA No Retinopathy 09/07/2017 12:00 AM    Last diabetic foot exam:  Lab Results  Component Value Date/Time   HMDIABFOOTEX Normal 04/13/2016 12:00 AM    We discussed: ***  Plan Continue current medications and    Hypertension  BP today is:  {CHL HP UPSTREAM Pharmacist BP ranges:[phone]}  Office blood pressures are  BP Readings from Last 3 Encounters:  02/19/19 130/78  02/06/19 (!) 154/79  01/26/19 (!) 172/72   Patient is currently controlled on the following medications: irbesartan 150 mg daily, amlodipine 5 mg daily, hctz 25mg  daily. Patient checks BP at home {CHL HP BP Monitoring [phone]} Patient home BP readings are ranging: *** We discussed {CHL HP Upstream Pharmacy [phone]}  Plan Continue {CHL HP Upstream Pharmacy   Hyperlipidemia   Lipid Panel     Component Value Date/Time  CHOL 113 01/05/2019 1512   TRIG 211 (H) 01/05/2019 1512   HDL 44 (L) 01/05/2019 1512   CHOLHDL 2.6 01/05/2019 1512   VLDL 30.4 06/23/2017 0756   LDLCALC 41 01/05/2019 1512   Cholesterol current at goal, TG elevated. Taking Lovastatin 20 mg by mouth once daily, fish oil 1000 mg daily. We ***  increasing fish oil to 2000mg  daily *** reducing simple carbohydrate intake.   Plan Continue current medications and ***  Hypothyroidism   TSH  Date Value Ref Range Status  03/15/2019 2.21 0.35 - 4.50  uIU/mL Final    Patient is currently controlled on the following medications: levothyroxine 88 mcg  We discussed:  ***  Plan Continue current medications _______________ Visit Information Ms. Mcquiston was given information about Chronic Care Management services today including:  1. CCM service includes personalized support from designated clinical staff supervised by her physician, including individualized plan of care and coordination with other care providers 2. 24/7 contact phone numbers for assistance for urgent and routine care needs. 3. Standard insurance, coinsurance, copays and deductibles apply for chronic care management only during months in which we provide at least 20 minutes of these services. Most insurances cover these services at 100%, however patients may be responsible for any copay, coinsurance and/or deductible if applicable. This service may help you avoid the need for more expensive face-to-face services. 4. Only one practitioner may furnish and bill the service in a calendar month. 5. The patient may stop CCM services at any time (effective at the end of the month) by phone call to the office staff.  Patient agreed to services and verbal consent obtained.   Madelin Rear, Pharm.D. Clinical Pharmacist [facility] [phone]

## 2019-06-10 LAB — CUP PACEART REMOTE DEVICE CHECK
Date Time Interrogation Session: 20210418014409
Implantable Pulse Generator Implant Date: 20191008

## 2019-06-11 ENCOUNTER — Ambulatory Visit (INDEPENDENT_AMBULATORY_CARE_PROVIDER_SITE_OTHER): Payer: Medicare Other | Admitting: *Deleted

## 2019-06-11 DIAGNOSIS — I480 Paroxysmal atrial fibrillation: Secondary | ICD-10-CM

## 2019-06-11 NOTE — Progress Notes (Signed)
ILR Remote 

## 2019-06-30 ENCOUNTER — Other Ambulatory Visit: Payer: Self-pay | Admitting: Physician Assistant

## 2019-07-03 ENCOUNTER — Other Ambulatory Visit: Payer: Self-pay

## 2019-07-03 ENCOUNTER — Telehealth (INDEPENDENT_AMBULATORY_CARE_PROVIDER_SITE_OTHER): Payer: Medicare Other | Admitting: Physician Assistant

## 2019-07-03 ENCOUNTER — Encounter: Payer: Self-pay | Admitting: Physician Assistant

## 2019-07-03 VITALS — BP 144/76 | HR 63 | Temp 98.7°F | Resp 16 | Wt 199.6 lb

## 2019-07-03 DIAGNOSIS — J310 Chronic rhinitis: Secondary | ICD-10-CM

## 2019-07-03 DIAGNOSIS — E785 Hyperlipidemia, unspecified: Secondary | ICD-10-CM

## 2019-07-03 DIAGNOSIS — K219 Gastro-esophageal reflux disease without esophagitis: Secondary | ICD-10-CM

## 2019-07-03 DIAGNOSIS — E119 Type 2 diabetes mellitus without complications: Secondary | ICD-10-CM | POA: Diagnosis not present

## 2019-07-03 DIAGNOSIS — R197 Diarrhea, unspecified: Secondary | ICD-10-CM | POA: Diagnosis not present

## 2019-07-03 DIAGNOSIS — R1013 Epigastric pain: Secondary | ICD-10-CM | POA: Diagnosis not present

## 2019-07-03 LAB — LIPID PANEL
Cholesterol: 118 mg/dL (ref 0–200)
HDL: 36.1 mg/dL — ABNORMAL LOW (ref >39.00)
NonHDL: 81.91
Total CHOL/HDL Ratio: 3
Triglycerides: 249 mg/dL — ABNORMAL HIGH (ref 0.0–149.0)
VLDL: 49.8 mg/dL — ABNORMAL HIGH (ref 0.0–40.0)

## 2019-07-03 LAB — COMPREHENSIVE METABOLIC PANEL
ALT: 14 U/L (ref 0–35)
AST: 11 U/L (ref 0–37)
Albumin: 4.3 g/dL (ref 3.5–5.2)
Alkaline Phosphatase: 77 U/L (ref 39–117)
BUN: 20 mg/dL (ref 6–23)
CO2: 27 mEq/L (ref 19–32)
Calcium: 10.1 mg/dL (ref 8.4–10.5)
Chloride: 105 mEq/L (ref 96–112)
Creatinine, Ser: 1.04 mg/dL (ref 0.40–1.20)
GFR: 52.67 mL/min — ABNORMAL LOW (ref >60.00)
Glucose, Bld: 94 mg/dL (ref 70–99)
Potassium: 4 mEq/L (ref 3.5–5.1)
Sodium: 140 mEq/L (ref 135–145)
Total Bilirubin: 0.5 mg/dL (ref 0.2–1.2)
Total Protein: 6.7 g/dL (ref 6.0–8.3)

## 2019-07-03 LAB — LIPASE: Lipase: 26 U/L (ref 11.0–59.0)

## 2019-07-03 LAB — CBC WITH DIFFERENTIAL/PLATELET
Basophils Absolute: 0.1 10*3/uL (ref 0.0–0.1)
Basophils Relative: 0.9 % (ref 0.0–3.0)
Eosinophils Absolute: 0.1 10*3/uL (ref 0.0–0.7)
Eosinophils Relative: 1.9 % (ref 0.0–5.0)
HCT: 38.2 % (ref 36.0–46.0)
Hemoglobin: 12.8 g/dL (ref 12.0–15.0)
Lymphocytes Relative: 31.5 % (ref 12.0–46.0)
Lymphs Abs: 2.5 10*3/uL (ref 0.7–4.0)
MCHC: 33.4 g/dL (ref 30.0–36.0)
MCV: 84.8 fl (ref 78.0–100.0)
Monocytes Absolute: 0.6 10*3/uL (ref 0.1–1.0)
Monocytes Relative: 8.2 % (ref 3.0–12.0)
Neutro Abs: 4.6 10*3/uL (ref 1.4–7.7)
Neutrophils Relative %: 57.5 % (ref 43.0–77.0)
Platelets: 328 10*3/uL (ref 150.0–400.0)
RBC: 4.5 Mil/uL (ref 3.87–5.11)
RDW: 15.2 % (ref 11.5–15.5)
WBC: 8 10*3/uL (ref 4.0–10.5)

## 2019-07-03 LAB — LDL CHOLESTEROL, DIRECT: Direct LDL: 50 mg/dL

## 2019-07-03 LAB — HEMOGLOBIN A1C: Hgb A1c MFr Bld: 6.5 % (ref 4.6–6.5)

## 2019-07-03 MED ORDER — PANTOPRAZOLE SODIUM 40 MG PO TBEC: 40.0000 mg | DELAYED_RELEASE_TABLET | Freq: Every day | ORAL | 0 refills | Status: DC

## 2019-07-03 NOTE — Patient Instructions (Addendum)
Please go to the lab today for blood work.  I will call you with your results. We will alter treatment regimen(s) if indicated by your results.   Please increase fluids.  Start a daily saline nasal rinse.  Stop Claritin and start daily Xyzal OTC. Can use OTC nasacort every few days as well. This should help with drainage which is causing your reflux and GI issues.   Follow dietary recommendations below. Take the Protonix once daily over the next 2 weeks.   Continue cholesterol medication as directed for now.  Follow-up with me in 2 weeks unless I tell you otherwise when we call with results. Return sooner if needed.  Hang in there!   Food Choices for Gastroesophageal Reflux Disease, Adult When you have gastroesophageal reflux disease (GERD), the foods you eat and your eating habits are very important. Choosing the right foods can help ease your discomfort. Think about working with a nutrition specialist (dietitian) to help you make good choices. What are tips for following this plan?  Meals  Choose healthy foods that are low in fat, such as fruits, vegetables, whole grains, low-fat dairy products, and lean meat, fish, and poultry.  Eat small meals often instead of 3 large meals a day. Eat your meals slowly, and in a place where you are relaxed. Avoid bending over or lying down until 2-3 hours after eating.  Avoid eating meals 2-3 hours before bed.  Avoid drinking a lot of liquid with meals.  Cook foods using methods other than frying. Bake, grill, or broil food instead.  Avoid or limit: ? Chocolate. ? Peppermint or spearmint. ? Alcohol. ? Pepper. ? Black and decaffeinated coffee. ? Black and decaffeinated tea. ? Bubbly (carbonated) soft drinks. ? Caffeinated energy drinks and soft drinks.  Limit high-fat foods such as: ? Fatty meat or fried foods. ? Whole milk, cream, butter, or ice cream. ? Nuts and nut butters. ? Pastries, donuts, and sweets made with butter or  shortening.  Avoid foods that cause symptoms. These foods may be different for everyone. Common foods that cause symptoms include: ? Tomatoes. ? Oranges, lemons, and limes. ? Peppers. ? Spicy food. ? Onions and garlic. ? Vinegar. Lifestyle  Maintain a healthy weight. Ask your doctor what weight is healthy for you. If you need to lose weight, work with your doctor to do so safely.  Exercise for at least 30 minutes for 5 or more days each week, or as told by your doctor.  Wear loose-fitting clothes.  Do not smoke. If you need help quitting, ask your doctor.  Sleep with the head of your bed higher than your feet. Use a wedge under the mattress or blocks under the bed frame to raise the head of the bed. Summary  When you have gastroesophageal reflux disease (GERD), food and lifestyle choices are very important in easing your symptoms.  Eat small meals often instead of 3 large meals a day. Eat your meals slowly, and in a place where you are relaxed.  Limit high-fat foods such as fatty meat or fried foods.  Avoid bending over or lying down until 2-3 hours after eating.  Avoid peppermint and spearmint, caffeine, alcohol, and chocolate. This information is not intended to replace advice given to you by your health care provider. Make sure you discuss any questions you have with your health care provider. Document Revised: 06/01/2018 Document Reviewed: 03/16/2016 Elsevier Patient Education  2020 [employer].

## 2019-07-03 NOTE — Progress Notes (Signed)
Patient presents to clinic today after video screening by this provider c/o intermittent episodes of nausea and loose stool over the past month.  States she can sometimes go days without symptoms but then they will recur.  Notes lower abdominal cramping and occasional epigastric discomfort associated with heartburn and indigestion.  Has been dealing with a lot of drainage down her throat over the past few months, associated with ear fullness and nasal congestion.  Denies change in diet.  Tries to stay well-hydrated.  Denies any melena, hematochezia or tenesmus.  Notes she will have 1-2 episodes of watery stool.  For the rest of the day will notes softer but formed stool.  Denies any recent travel or sick contact.  Denies fever, chills, malaise or fatigue.  Was wondering if potentially her lovastatin was causing her symptoms.  Patient was started on this medication several months prior to symptom onset.  Past Medical History:  Diagnosis Date  . Abnormal cardiovascular stress test 08/21/2016   a. done for abnl EKG/cardiac risk factors -> admitted for cath 07/2016 which showed no significant CAD, normal LV contraction, mildly elevated filling pressure. Low dose Imdur was added for possible component of microvascular dysfunction.   . Allergy   . Anxiety   . Arthritis    "back, knees, fingers" (05/10/2017)  . Chronic lower back pain   . Colon polyps   . Deafness in right ear   . Depression   . Diverticulosis   . Hx of adenomatous colonic polyps 05/08/2003  . Hypertension   . Hypertriglyceridemia   . Hypothyroidism   . Migraine    "none in the 2000s" (05/10/2017)  . Osteoporosis   . Paroxysmal atrial fibrillation (HCC)   . Pneumonia    "several times" (05/10/2017)  . PONV (postoperative nausea and vomiting)   . S/P cardiac catheterization, 08/20/16 minimal CAD 08/21/2016  . Type II diabetes mellitus ([place])   . Voice tremor     Current Outpatient Medications on File Prior to Visit  Medication  Sig Dispense Refill  . amLODipine (NORVASC) 10 MG tablet TAKE 1 TABLET DAILY 90 tablet 1  . Cholecalciferol (VITAMIN D) 2000 units CAPS Take 1 capsule by mouth daily.     Marland Kitchen doxylamine, Sleep, (SLEEP AID) 25 MG tablet Take 25 mg by mouth at bedtime.    . DULoxetine (CYMBALTA) 30 MG capsule TAKE 1 CAPSULE DAILY 90 capsule 1  . glucose blood (CONTOUR NEXT TEST) test strip Check blood sugars once daily 100 each 6  . hydrALAZINE (APRESOLINE) 50 MG tablet TAKE 1 TABLET BY MOUTH TWICE A DAY 180 tablet 1  . hydrochlorothiazide (HYDRODIURIL) 25 MG tablet TAKE 1 TABLET DAILY 90 tablet 2  . irbesartan (AVAPRO) 150 MG tablet TAKE 1 TABLET DAILY 90 tablet 2  . KLOR-CON M20 20 MEQ tablet TAKE 1 TABLET TWICE A DAY 180 tablet 3  . levothyroxine (SYNTHROID) 88 MCG tablet Take 1 tablet (88 mcg total) by mouth daily. 90 tablet 1  . lovastatin (MEVACOR) 20 MG tablet Take 1 tablet (20 mg total) by mouth at bedtime. 30 tablet 3  . Magnesium 200 MG TABS Take 200 mg by mouth daily.     . metFORMIN (GLUCOPHAGE) 500 MG tablet TAKE 1 TABLET TWICE DAILY  WITH MEALS 180 tablet 1  . Omega-3 Fatty Acids (FISH OIL) 1000 MG CAPS Take 1,000 mg by mouth daily.     . vitamin B-12 (CYANOCOBALAMIN) 1000 MCG tablet Take 1 tablet (1,000 mcg total) by mouth daily.  30 tablet 0  . aspirin EC 81 MG tablet Take 1 tablet (81 mg total) by mouth daily. 30 tablet 0  . LANCETS ULTRA THIN 30G MISC 1 Stick by Does not apply route daily. 100 each 6   No current facility-administered medications on file prior to visit.    Allergies  Allergen Reactions  . Demeclocycline Rash  . Doxycycline Rash  . Erythromycin Rash  . Lisinopril Cough  . Tetracyclines & Related Rash    Family History  Problem Relation Age of Onset  . Hypertension Mother   . Diabetes Mother   . Heart attack Mother   . Colon polyps Mother   . Hypertension Father   . Heart attack Father   . Stroke Father   . Breast cancer Sister 52  . Hypertension Brother   .  Cancer Brother        Prostate  . Diabetes Brother   . Dementia Brother   . Stroke Brother   . Heart attack Brother   . Prostate cancer Brother   . Hypertension Son   . Breast cancer Maternal Aunt        in 52's  . Cancer Maternal Uncle        Lung  . Colon cancer Neg Hx   . Esophageal cancer Neg Hx   . Rectal cancer Neg Hx   . Stomach cancer Neg Hx     Social History   Socioeconomic History  . Marital status: Married    Spouse name: Not on file  . Number of children: 2  . Years of education: 64  . Highest education level: Not on file  Occupational History  . Occupation: Health and safety inspector  Tobacco Use  . Smoking status: Never Smoker  . Smokeless tobacco: Never Used  Substance and Sexual Activity  . Alcohol use: No  . Drug use: No  . Sexual activity: Not Currently  Other Topics Concern  . Not on file  Social History Narrative   Lives at home with husband in [place].   Right-handed.   No caffeine use.   Manages a call center   Social Determinants of Health   Financial Resource Strain:   . Difficulty of Paying Living Expenses:   Food Insecurity:   . Worried About Charity fundraiser in the Last Year:   . Arboriculturist in the Last Year:   Transportation Needs:   . Film/video editor (Medical):   Marland Kitchen Lack of Transportation (Non-Medical):   Physical Activity:   . Days of Exercise per Week:   . Minutes of Exercise per Session:   Stress:   . Feeling of Stress :   Social Connections:   . Frequency of Communication with Friends and Family:   . Frequency of Social Gatherings with Friends and Family:   . Attends Religious Services:   . Active Member of Clubs or Organizations:   . Attends Archivist Meetings:   Marland Kitchen Marital Status:    Review of Systems - See HPI.  All other ROS are negative.  BP (!) 144/76   Pulse 63   Temp 98.7 F (37.1 C) (Temporal)   Resp 16   Wt 199 lb 9.6 oz (90.5 kg)   SpO2 98%   BMI 31.26 kg/m   Physical Exam Vitals  reviewed.  Constitutional:      Appearance: She is well-developed.  HENT:     Head: Normocephalic and atraumatic.     Right Ear: Ear canal  and external ear normal. A middle ear effusion (Serous fluid noted) is present. There is no impacted cerumen.     Left Ear: Ear canal and external ear normal. A middle ear effusion (Scant amount of serous fluid noted) is present. There is no impacted cerumen.     Nose: Nose normal.  Eyes:     Pupils: Pupils are equal, round, and reactive to light.  Cardiovascular:     Rate and Rhythm: Normal rate and regular rhythm.     Pulses: Normal pulses.     Heart sounds: Normal heart sounds.  Pulmonary:     Effort: Pulmonary effort is normal.     Breath sounds: Normal breath sounds.  Abdominal:     General: Bowel sounds are normal.     Palpations: Abdomen is soft.     Tenderness: There is no abdominal tenderness.  Neurological:     General: No focal deficit present.     Mental Status: She is alert and oriented to person, place, and time.  Psychiatric:        Mood and Affect: Mood normal.     Recent Results (from the past 2160 hour(s))  CUP PACEART REMOTE DEVICE CHECK     Status: None   Collection Time: 05/10/19  1:28 AM  Result Value Ref Range   Date Time Interrogation Session [phone]    Pulse Generator Manufacturer MERM    Pulse Gen Model [id number] Reveal LINQ    Pulse Gen Serial Number [id number] [facility]    Implantable Pulse Generator Type ICM/ILR    Implantable Pulse Generator Implant Date [id number]    Eval Rhythm SR at 66 bpm   CUP PACEART REMOTE DEVICE CHECK     Status: None   Collection Time: 06/10/19  1:44 AM  Result Value Ref Range   Date Time Interrogation Session [id number]    Pulse Generator Manufacturer MERM    Pulse Gen Model [id number] Reveal LINQ    Pulse Gen Serial Number [id number]    Clinic Name [place] Pulse Generator Type ICM/ILR    Implantable Pulse Generator Implant Date  [id number]     Assessment/Plan: 1. Chronic rhinitis Associated with some eustachian tube dysfunction.  We will have her start daily Xyzal.  Nasacort every other day.  Start daily saline nasal rinse.  Supportive measures and other OTC medications reviewed.  Reassess in 2 weeks.  2. Gastroesophageal reflux disease without esophagitis Secondary to diet and #1.  GERD diet reviewed.  Handout given.  Will start 2-week trial of Protonix 40 mg once daily.  Treating both the rhinitis/PND and GERD should help with nausea.  3. Diarrhea, unspecified type Passage of loose stools more so than frequent loose stools.  This is been ongoing and intermittent over the past month.  Examination unremarkable today.  Will obtain lab work-up and stool studies to further assess.  Recommend patient start daily probiotic.  Change in stool could be related to the significant amount of postnasal drainage she is having which seems to have thrown her GI tract off. - CBC w/Diff - Comp Met (CMET) - Lipase - Stool Culture - Clostridium difficile culture-fecal  4. Epigastric pain Noted with deep palpation on examination today.  Suspect related to GERD.  PPI as directed.  Will check labs today to include lipase to further assess.  Follow-up 2 weeks. - CBC w/Diff - Comp Met (CMET) - Lipase  5. Hyperlipidemia, unspecified hyperlipidemia type Discussed with patient  do not feel her medication is contributing to her current symptoms.  Recommend she continue as directed for now.  Repeat fasting lipid panel today. - Lipid Profile  6. Type 2 diabetes mellitus without complication, without long-term current use of insulin (HCC) - Hemoglobin A1c  This visit occurred during the SARS-CoV-2 public health emergency.  Safety protocols were in place, including screening questions prior to the visit, additional usage of staff PPE, and extensive cleaning of exam room while observing appropriate contact time as indicated for disinfecting  solutions.     Leeanne Rio, PA-C

## 2019-07-03 NOTE — Progress Notes (Signed)
I have discussed the procedure for the virtual visit with the patient who has given consent to proceed with assessment and treatment. Patient is unable to obtain vital signs.  Ashley Todorov N Manuelito Poage, LPN     

## 2019-07-03 NOTE — Progress Notes (Deleted)
Virtual Visit via Video   I connected with patient on 07/03/19 at  1:00 PM EDT by a video enabled telemedicine application and verified that I am speaking with the correct person using two identifiers.  Location patient: Home Location provider: [facility], Office Persons participating in the virtual visit: Patient, Provider, CMA (Patina Moore)  I discussed the limitations of evaluation and management by telemedicine and the availability of in person appointments. The patient expressed understanding and agreed to proceed.  Subjective:   HPI:   Patient presents via Caregility today ***.  ROS:   See pertinent positives and negatives per HPI.  Patient Active Problem List   Diagnosis Date Noted  . Paroxysmal atrial fibrillation (HCC) 05/10/2017  . Hypothyroidism 02/24/2017  . 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 S/P cardiac catheterization, 08/20/16 minimal CAD  08/21/2016  . Unstable angina (HCC)   . DDD (degenerative disc disease), lumbar 06/29/2016  . Meniere's disease 06/29/2016  . Diabetes mellitus, type 2 (HCC) 09/30/2014  . Hyperlipemia 09/30/2014  . Hypertension 09/30/2014  . Hx of adenomatous colonic polyps 05/08/2003    Social History   Tobacco Use  . Smoking status: Never Smoker  . Smokeless tobacco: Never Used  Substance Use Topics  . Alcohol use: No    Current Outpatient Medications:  .  amLODipine (NORVASC) 10 MG tablet, TAKE 1 TABLET DAILY, Disp: 90 tablet, Rfl: 1 .  aspirin EC 81 MG tablet, Take 1 tablet (81 mg total) by mouth daily., Disp: 30 tablet, Rfl: 0 .  Cholecalciferol (VITAMIN D) 2000 units CAPS, Take 1 capsule by mouth daily. , Disp: , Rfl:  .  doxylamine, Sleep, (SLEEP AID) 25 MG tablet, Take 25 mg by mouth at bedtime., Disp: , Rfl:  .  DULoxetine (CYMBALTA) 30 MG capsule, TAKE 1 CAPSULE DAILY, Disp: 90 capsule, Rfl: 1 .  glucose blood (CONTOUR NEXT TEST) test strip, Check blood sugars once daily, Disp: 100 each, Rfl: 6 .  hydrALAZINE  (APRESOLINE) 50 MG tablet, TAKE 1 TABLET BY MOUTH TWICE A DAY, Disp: 180 tablet, Rfl: 1 .  hydrochlorothiazide (HYDRODIURIL) 25 MG tablet, TAKE 1 TABLET DAILY, Disp: 90 tablet, Rfl: 2 .  hydrOXYzine (ATARAX/VISTARIL) 10 MG tablet, Take 1 tablet (10 mg total) by mouth 3 (three) times daily as needed., Disp: 60 tablet, Rfl: 0 .  irbesartan (AVAPRO) 150 MG tablet, TAKE 1 TABLET DAILY, Disp: 90 tablet, Rfl: 2 .  KLOR-CON M20 20 MEQ tablet, TAKE 1 TABLET TWICE A DAY, Disp: 180 tablet, Rfl: 3 .  LANCETS ULTRA THIN 30G MISC, 1 Stick by Does not apply route daily., Disp: 100 each, Rfl: 6 .  levothyroxine (SYNTHROID) 88 MCG tablet, Take 1 tablet (88 mcg total) by mouth daily., Disp: 90 tablet, Rfl: 1 .  lovastatin (MEVACOR) 20 MG tablet, Take 1 tablet (20 mg total) by mouth at bedtime., Disp: 30 tablet, Rfl: 3 .  Magnesium 200 MG TABS, Take 200 mg by mouth daily. , Disp: , Rfl:  .  metFORMIN (GLUCOPHAGE) 500 MG tablet, TAKE 1 TABLET TWICE DAILY  WITH MEALS, Disp: 180 tablet, Rfl: 1 .  Omega-3 Fatty Acids (FISH OIL) 1000 MG CAPS, Take 1,000 mg by mouth daily. , Disp: , Rfl:  .  vitamin B-12 (CYANOCOBALAMIN) 1000 MCG tablet, Take 1 tablet (1,000 mcg total) by mouth daily., Disp: 30 tablet, Rfl: 0  Allergies  Allergen Reactions  . Demeclocycline Rash  . Doxycycline Rash  . Erythromycin Rash  . Lisinopril Cough  . Tetracyclines & Related  Rash    Objective:   There were no vitals taken for this visit.  Patient is well-developed, well-nourished in no acute distress.  Resting comfortably *** at home.  Head is normocephalic, atraumatic.  No labored breathing.  Speech is clear and coherent with logical content.  Patient is alert and oriented at baseline.  ***  Assessment and Plan:   ***.   Leeanne Rio, PA-C 07/03/2019

## 2019-07-08 ENCOUNTER — Other Ambulatory Visit: Payer: Self-pay | Admitting: Physician Assistant

## 2019-07-08 DIAGNOSIS — E785 Hyperlipidemia, unspecified: Secondary | ICD-10-CM

## 2019-07-16 ENCOUNTER — Ambulatory Visit (INDEPENDENT_AMBULATORY_CARE_PROVIDER_SITE_OTHER): Payer: Medicare Other | Admitting: *Deleted

## 2019-07-16 DIAGNOSIS — I480 Paroxysmal atrial fibrillation: Secondary | ICD-10-CM | POA: Diagnosis not present

## 2019-07-16 LAB — CUP PACEART REMOTE DEVICE CHECK
Date Time Interrogation Session: 20210519015320
Implantable Pulse Generator Implant Date: 20191008

## 2019-07-17 NOTE — Progress Notes (Signed)
Carelink Summary Report / Loop Recorder 

## 2019-07-19 ENCOUNTER — Other Ambulatory Visit: Payer: Self-pay | Admitting: Physician Assistant

## 2019-07-19 DIAGNOSIS — E039 Hypothyroidism, unspecified: Secondary | ICD-10-CM

## 2019-07-25 ENCOUNTER — Encounter: Payer: Self-pay | Admitting: Physician Assistant

## 2019-07-25 ENCOUNTER — Other Ambulatory Visit: Payer: Self-pay

## 2019-07-25 ENCOUNTER — Telehealth (INDEPENDENT_AMBULATORY_CARE_PROVIDER_SITE_OTHER): Payer: Medicare Other | Admitting: Physician Assistant

## 2019-07-25 DIAGNOSIS — K219 Gastro-esophageal reflux disease without esophagitis: Secondary | ICD-10-CM | POA: Diagnosis not present

## 2019-07-25 DIAGNOSIS — J310 Chronic rhinitis: Secondary | ICD-10-CM | POA: Diagnosis not present

## 2019-07-25 MED ORDER — AZELASTINE HCL 0.1 % NA SOLN: 1.0000 | Freq: Two times a day (BID) | NASAL | 12 refills | Status: DC

## 2019-07-25 NOTE — Progress Notes (Signed)
Virtual Visit via Video   I connected with patient on 07/25/19 at 10:00 AM EDT by a video enabled telemedicine application and verified that I am speaking with the correct person using two identifiers.  Location patient: Home Location provider: [facility], Office Persons participating in the virtual visit: Patient, Provider, CMA (Patina Moore)  I discussed the limitations of evaluation and management by telemedicine and the availability of in person appointments. The patient expressed understanding and agreed to proceed.  Subjective:   HPI:   Patient presents via Caregility today for follow-up regarding GERD and chronic rhinitis with PND. In regards to the GERD, has been following GERD diet and taking the Protonix daily as directed. Has made significant changes. Has noted resolution of heart burn, nausea, loose stool and abdominal cramping.  Denies any epigastric pain.  Denies any new or worsening symptoms.  In regards to chronic rhinitis and postnasal drip she endorses taking the Xyzal and Nasacort as directed.  Has only noted slight improvement.  Still having significant postnasal drainage.  Denies sinus pain, ear pain, facial pain.  Denies fever, chills or other URI symptoms.  ROS:   See pertinent positives and negatives per HPI.  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   S/P cardiac catheterization, 08/20/16 minimal CAD  08/21/2016   Unstable angina (HCC)    DDD (degenerative disc disease), lumbar 06/29/2016   Meniere's disease 06/29/2016   Diabetes mellitus, type 2 (HCC) 09/30/2014   Hyperlipemia 09/30/2014   Hypertension 09/30/2014   Hx of adenomatous colonic polyps 05/08/2003    Social History   Tobacco Use   Smoking status: Never Smoker   Smokeless tobacco: Never Used  Substance Use Topics   Alcohol use: No    Current  Outpatient Medications:    amLODipine (NORVASC) 10 MG tablet, TAKE 1 TABLET DAILY, Disp: 90 tablet, Rfl: 1   aspirin EC 81 MG tablet, Take 1 tablet (81 mg total) by mouth daily., Disp: 30 tablet, Rfl: 0   Cholecalciferol (VITAMIN D) 2000 units CAPS, Take 1 capsule by mouth daily. , Disp: , Rfl:    doxylamine, Sleep, (SLEEP AID) 25 MG tablet, Take 25 mg by mouth at bedtime., Disp: , Rfl:    DULoxetine (CYMBALTA) 30 MG capsule, TAKE 1 CAPSULE DAILY, Disp: 90 capsule, Rfl: 1   glucose blood (CONTOUR NEXT TEST) test strip, Check blood sugars once daily, Disp: 100 each, Rfl: 6   hydrALAZINE (APRESOLINE) 50 MG tablet, TAKE 1 TABLET BY MOUTH TWICE A DAY, Disp: 180 tablet, Rfl: 1   hydrochlorothiazide (HYDRODIURIL) 25 MG tablet, TAKE 1 TABLET DAILY, Disp: 90 tablet, Rfl: 2   irbesartan (AVAPRO) 150 MG tablet, TAKE 1 TABLET DAILY, Disp: 90 tablet, Rfl: 2   KLOR-CON M20 20 MEQ tablet, TAKE 1 TABLET TWICE A DAY, Disp: 180 tablet, Rfl: 3   LANCETS ULTRA THIN 30G MISC, 1 Stick by Does not apply route daily., Disp: 100 each, Rfl: 6   levothyroxine (SYNTHROID) 88 MCG tablet, TAKE 1 TABLET BY MOUTH EVERY DAY, Disp: 90 tablet, Rfl: 1   lovastatin (MEVACOR) 20 MG tablet, TAKE 1 TABLET BY MOUTH EVERYDAY AT BEDTIME, Disp: 90 tablet, Rfl: 1   Magnesium 200 MG TABS, Take 200 mg by mouth daily. , Disp: , Rfl:    metFORMIN (GLUCOPHAGE) 500 MG tablet, TAKE 1 TABLET TWICE DAILY  WITH MEALS, Disp: 180 tablet, Rfl: 1  Omega-3 Fatty Acids (FISH OIL) 1000 MG CAPS, Take 1,000 mg by mouth daily. , Disp: , Rfl:    pantoprazole (PROTONIX) 40 MG tablet, Take 1 tablet (40 mg total) by mouth daily., Disp: 30 tablet, Rfl: 0   vitamin B-12 (CYANOCOBALAMIN) 1000 MCG tablet, Take 1 tablet (1,000 mcg total) by mouth daily., Disp: 30 tablet, Rfl: 0  Allergies  Allergen Reactions   Demeclocycline Rash   Doxycycline Rash   Erythromycin Rash   Lisinopril Cough   Tetracyclines & Related Rash    Objective:    There were no vitals taken for this visit.  Patient is well-developed, well-nourished in no acute distress.  Resting comfortably at home.  Head is normocephalic, atraumatic.  No labored breathing.  Speech is clear and coherent with logical content.  Patient is alert and oriented at baseline.   Assessment and Plan:   1. Gastroesophageal reflux disease without esophagitis Significant improvement.  Continue GERD diet.  Will continue Protonix daily for now, especially giving she is still having issue with postnasal drip which was worsening her GERD symptoms.  As we get that under better control we will start to wean down on PPI.  Encouraged continued diet and exercise to promote weight loss which will also help GERD symptoms.  2. Chronic rhinitis We will have patient continue Xyzal and Nasacort.  Add on nightly saline nasal rinse.  We will also Rx Astelin.  She is to monitor symptoms over the next week.  If no significant improvement may consider addition of Singulair versus referral to allergist.  Patient voiced understanding and agreement with plan.   Leeanne Rio, PA-C 07/25/2019

## 2019-07-25 NOTE — Patient Instructions (Signed)
Instructions sent to MyChart

## 2019-07-25 NOTE — Progress Notes (Signed)
I have discussed the procedure for the virtual visit with the patient who has given consent to proceed with assessment and treatment.   Ashley Woodard, CMA     

## 2019-07-27 ENCOUNTER — Other Ambulatory Visit: Payer: Self-pay | Admitting: Physician Assistant

## 2019-07-27 NOTE — Telephone Encounter (Signed)
Last OV 07/25/19 Last refill 07/03/19 #30/0 Next OV not scheduled

## 2019-08-08 ENCOUNTER — Other Ambulatory Visit: Payer: Self-pay

## 2019-08-08 ENCOUNTER — Telehealth: Payer: Self-pay

## 2019-08-08 ENCOUNTER — Ambulatory Visit: Payer: Medicare Other

## 2019-08-08 NOTE — Telephone Encounter (Signed)
°  Chronic Care Management   Outreach Note   Name: Ashley Woodard MRN: [id number] DOB: 10/22/1950  Referred by: Waldon Merl, PA-C Reason for referral: Telephone Appointment with [facility] Clinical Pharmacist, Dahlia Byes.   An unsuccessful telephone outreach was attempted today. The patient was referred to the pharmacist for assistance with care management and care coordination.    Telephone appointment with clinical pharmacist today (08/08/2019) at 2pm. If patient returns call immediately transfer to ext [phone], otherwise please provide number below to reschedule visit.   Dahlia Byes, Pharm.D., BCGP Clinical Pharmacist [facility] at [facility] [phone]

## 2019-08-08 NOTE — Progress Notes (Unsigned)
Chronic Care Management Pharmacy  Name: Ashley Woodard  MRN: [id number] DOB: 1950/08/12  Chief Complaint/ HPI Ashley Woodard,  69 y.o. , female presents for their Initial CCM visit with the clinical pharmacist via telephone due to COVID-19 Pandemic.  PCP : Brunetta Jeans, PA-C  Their chronic conditions include: HTN, HLD, depression, HoTR  Office Visits: 11/13: (AWV): no medication changes   Consult Visit: -12/15 (ED): fracture of distal end of right fibula; hydrocodone-acetaminophen 5-325 given  Medications: Outpatient Encounter Medications as of 08/08/2019  Medication Sig   pantoprazole (PROTONIX) 40 MG tablet TAKE 1 TABLET BY MOUTH EVERY DAY   amLODipine (NORVASC) 10 MG tablet TAKE 1 TABLET DAILY   aspirin EC 81 MG tablet Take 1 tablet (81 mg total) by mouth daily.   azelastine (ASTELIN) 0.1 % nasal spray Place 1 spray into both nostrils 2 (two) times daily. Use in each nostril as directed   Cholecalciferol (VITAMIN D) 2000 units CAPS Take 1 capsule by mouth daily.    doxylamine, Sleep, (SLEEP AID) 25 MG tablet Take 25 mg by mouth at bedtime.   DULoxetine (CYMBALTA) 30 MG capsule TAKE 1 CAPSULE DAILY   glucose blood (CONTOUR NEXT TEST) test strip Check blood sugars once daily   hydrALAZINE (APRESOLINE) 50 MG tablet TAKE 1 TABLET BY MOUTH TWICE A DAY   hydrochlorothiazide (HYDRODIURIL) 25 MG tablet TAKE 1 TABLET DAILY   irbesartan (AVAPRO) 150 MG tablet TAKE 1 TABLET DAILY   KLOR-CON M20 20 MEQ tablet TAKE 1 TABLET TWICE A DAY   LANCETS ULTRA THIN 30G MISC 1 Stick by Does not apply route daily.   levothyroxine (SYNTHROID) 88 MCG tablet TAKE 1 TABLET BY MOUTH EVERY DAY   lovastatin (MEVACOR) 20 MG tablet TAKE 1 TABLET BY MOUTH EVERYDAY AT BEDTIME   Magnesium 200 MG TABS Take 200 mg by mouth daily.    metFORMIN (GLUCOPHAGE) 500 MG tablet TAKE 1 TABLET TWICE DAILY  WITH MEALS   Omega-3 Fatty Acids (FISH OIL) 1000 MG CAPS Take 1,000 mg by mouth daily.    vitamin  B-12 (CYANOCOBALAMIN) 1000 MCG tablet Take 1 tablet (1,000 mcg total) by mouth daily.   No facility-administered encounter medications on file as of 08/08/2019.   Current Diagnosis/Assessment:  Goals Addressed   None    Diabetes   Recent Relevant Labs: Lab Results  Component Value Date/Time   HGBA1C 6.5 07/03/2019 01:57 PM   HGBA1C 6.5 (H) 01/05/2019 03:12 PM   HGBA1C 6.8 04/12/2016 12:00 AM   MICROALBUR 0.87 04/12/2016 12:00 AM  Patient is currently controlled on the following medications: metformin 500 mg twice daily with meals.   Last diabetic eye exam:  Lab Results  Component Value Date/Time   HMDIABEYEEXA No Retinopathy 09/07/2017 12:00 AM    Last diabetic foot exam:  Lab Results  Component Value Date/Time   HMDIABFOOTEX Normal 04/13/2016 12:00 AM    We discussed: ***  Plan Continue current medications and    Hypertension  BP today is:  {CHL HP UPSTREAM Pharmacist BP ranges:8182328085}  Office blood pressures are  BP Readings from Last 3 Encounters:  07/03/19 (!) 144/76  02/19/19 130/78  02/06/19 (!) 154/79   Patient is currently controlled on the following medications: irbesartan 150 mg daily, amlodipine 5 mg daily, hctz 25mg  daily. Patient checks BP at home {CHL HP BP Monitoring Frequency:[id number]} Patient home BP readings are ranging: *** We discussed {CHL HP Upstream Pharmacy discussion:8130232675}  Plan Continue {CHL HP Upstream Pharmacy OFBPZ:[id number]}  Hyperlipidemia   Lipid Panel     Component Value Date/Time   CHOL 118 07/03/2019 1357   TRIG 249.0 (H) 07/03/2019 1357   HDL 36.10 (L) 07/03/2019 1357   CHOLHDL 3 07/03/2019 1357   VLDL 49.8 (H) 07/03/2019 1357   LDLCALC 41 01/05/2019 1512   LDLDIRECT 50.0 07/03/2019 1357   Cholesterol current at goal, TG elevated. Taking Lovastatin 20 mg by mouth once daily, fish oil 1000 mg daily. We ***  increasing fish oil to 2000mg  daily *** reducing simple carbohydrate intake.   Plan Continue  current medications and ***  Hypothyroidism   TSH  Date Value Ref Range Status  03/15/2019 2.21 0.35 - 4.50 uIU/mL Final    Patient is currently controlled on the following medications: levothyroxine 88 mcg  We discussed:  ***  Plan Continue current medications _______________ Visit Information [name] was given information about Chronic Care Management services today including:  1. CCM service includes personalized support from designated clinical staff supervised by her physician, including individualized plan of care and coordination with other care providers 2. 24/7 contact phone numbers for assistance for urgent and routine care needs. 3. Standard insurance, coinsurance, copays and deductibles apply for chronic care management only during months in which we provide at least 20 minutes of these services. Most insurances cover these services at 100%, however patients may be responsible for any copay, coinsurance and/or deductible if applicable. This service may help you avoid the need for more expensive face-to-face services. 4. Only one practitioner may furnish and bill the service in a calendar month. 5. The patient may stop CCM services at any time (effective at the end of the month) by phone call to the office staff.  Patient agreed to services and verbal consent obtained.   Freada Bergeron, Pharm.D. Clinical Pharmacist [facility] at [facility] [phone]

## 2019-08-20 ENCOUNTER — Other Ambulatory Visit: Payer: Self-pay | Admitting: Physician Assistant

## 2019-08-20 ENCOUNTER — Ambulatory Visit (INDEPENDENT_AMBULATORY_CARE_PROVIDER_SITE_OTHER): Payer: Medicare Other | Admitting: *Deleted

## 2019-08-20 DIAGNOSIS — I480 Paroxysmal atrial fibrillation: Secondary | ICD-10-CM | POA: Diagnosis not present

## 2019-08-20 DIAGNOSIS — Z1231 Encounter for screening mammogram for malignant neoplasm of breast: Secondary | ICD-10-CM

## 2019-08-20 LAB — CUP PACEART REMOTE DEVICE CHECK
Date Time Interrogation Session: 20210627234059
Implantable Pulse Generator Implant Date: 20191008

## 2019-08-20 NOTE — Telephone Encounter (Signed)
LFD 02/19/19 #180 with 1 refill LOV 07/03/19 NOV none

## 2019-08-21 NOTE — Progress Notes (Signed)
Carelink Summary Report / Loop Recorder 

## 2019-08-30 ENCOUNTER — Ambulatory Visit
Admission: RE | Admit: 2019-08-30 | Discharge: 2019-08-30 | Disposition: A | Discharge status: Home / Self Care | Payer: Medicare Other | Source: Ambulatory Visit | Attending: Physician Assistant | Admitting: Physician Assistant

## 2019-08-30 ENCOUNTER — Other Ambulatory Visit: Payer: Self-pay

## 2019-08-30 DIAGNOSIS — Z1231 Encounter for screening mammogram for malignant neoplasm of breast: Secondary | ICD-10-CM

## 2019-09-01 ENCOUNTER — Other Ambulatory Visit: Payer: Self-pay | Admitting: Internal Medicine

## 2019-09-01 ENCOUNTER — Other Ambulatory Visit: Payer: Self-pay | Admitting: Physician Assistant

## 2019-09-03 DIAGNOSIS — Q72892 Other reduction defects of left lower limb: Secondary | ICD-10-CM | POA: Diagnosis not present

## 2019-09-03 DIAGNOSIS — M9905 Segmental and somatic dysfunction of pelvic region: Secondary | ICD-10-CM | POA: Diagnosis not present

## 2019-09-03 DIAGNOSIS — M5432 Sciatica, left side: Secondary | ICD-10-CM | POA: Diagnosis not present

## 2019-09-03 DIAGNOSIS — M9904 Segmental and somatic dysfunction of sacral region: Secondary | ICD-10-CM | POA: Diagnosis not present

## 2019-09-03 DIAGNOSIS — M9903 Segmental and somatic dysfunction of lumbar region: Secondary | ICD-10-CM | POA: Diagnosis not present

## 2019-09-03 DIAGNOSIS — M5137 Other intervertebral disc degeneration, lumbosacral region: Secondary | ICD-10-CM | POA: Diagnosis not present

## 2019-09-03 DIAGNOSIS — M5431 Sciatica, right side: Secondary | ICD-10-CM | POA: Diagnosis not present

## 2019-09-05 ENCOUNTER — Other Ambulatory Visit: Payer: Self-pay | Admitting: Emergency Medicine

## 2019-09-05 DIAGNOSIS — M5432 Sciatica, left side: Secondary | ICD-10-CM | POA: Diagnosis not present

## 2019-09-05 DIAGNOSIS — M9905 Segmental and somatic dysfunction of pelvic region: Secondary | ICD-10-CM | POA: Diagnosis not present

## 2019-09-05 DIAGNOSIS — M9903 Segmental and somatic dysfunction of lumbar region: Secondary | ICD-10-CM | POA: Diagnosis not present

## 2019-09-05 DIAGNOSIS — Q72892 Other reduction defects of left lower limb: Secondary | ICD-10-CM | POA: Diagnosis not present

## 2019-09-05 DIAGNOSIS — M5431 Sciatica, right side: Secondary | ICD-10-CM | POA: Diagnosis not present

## 2019-09-05 DIAGNOSIS — M9904 Segmental and somatic dysfunction of sacral region: Secondary | ICD-10-CM | POA: Diagnosis not present

## 2019-09-05 DIAGNOSIS — M5137 Other intervertebral disc degeneration, lumbosacral region: Secondary | ICD-10-CM | POA: Diagnosis not present

## 2019-09-05 MED ORDER — HYDRALAZINE HCL 50 MG PO TABS: 50.0000 mg | ORAL_TABLET | Freq: Two times a day (BID) | ORAL | 1 refills | Status: DC

## 2019-09-06 DIAGNOSIS — M9904 Segmental and somatic dysfunction of sacral region: Secondary | ICD-10-CM | POA: Diagnosis not present

## 2019-09-06 DIAGNOSIS — M9905 Segmental and somatic dysfunction of pelvic region: Secondary | ICD-10-CM | POA: Diagnosis not present

## 2019-09-06 DIAGNOSIS — M5137 Other intervertebral disc degeneration, lumbosacral region: Secondary | ICD-10-CM | POA: Diagnosis not present

## 2019-09-06 DIAGNOSIS — M5432 Sciatica, left side: Secondary | ICD-10-CM | POA: Diagnosis not present

## 2019-09-06 DIAGNOSIS — Q72892 Other reduction defects of left lower limb: Secondary | ICD-10-CM | POA: Diagnosis not present

## 2019-09-06 DIAGNOSIS — M9903 Segmental and somatic dysfunction of lumbar region: Secondary | ICD-10-CM | POA: Diagnosis not present

## 2019-09-06 DIAGNOSIS — M5431 Sciatica, right side: Secondary | ICD-10-CM | POA: Diagnosis not present

## 2019-09-11 DIAGNOSIS — Q72892 Other reduction defects of left lower limb: Secondary | ICD-10-CM | POA: Diagnosis not present

## 2019-09-11 DIAGNOSIS — M9903 Segmental and somatic dysfunction of lumbar region: Secondary | ICD-10-CM | POA: Diagnosis not present

## 2019-09-11 DIAGNOSIS — M5137 Other intervertebral disc degeneration, lumbosacral region: Secondary | ICD-10-CM | POA: Diagnosis not present

## 2019-09-11 DIAGNOSIS — M5431 Sciatica, right side: Secondary | ICD-10-CM | POA: Diagnosis not present

## 2019-09-11 DIAGNOSIS — M5432 Sciatica, left side: Secondary | ICD-10-CM | POA: Diagnosis not present

## 2019-09-11 DIAGNOSIS — M9905 Segmental and somatic dysfunction of pelvic region: Secondary | ICD-10-CM | POA: Diagnosis not present

## 2019-09-11 DIAGNOSIS — M9904 Segmental and somatic dysfunction of sacral region: Secondary | ICD-10-CM | POA: Diagnosis not present

## 2019-09-17 DIAGNOSIS — M5432 Sciatica, left side: Secondary | ICD-10-CM | POA: Diagnosis not present

## 2019-09-17 DIAGNOSIS — M9905 Segmental and somatic dysfunction of pelvic region: Secondary | ICD-10-CM | POA: Diagnosis not present

## 2019-09-17 DIAGNOSIS — M5431 Sciatica, right side: Secondary | ICD-10-CM | POA: Diagnosis not present

## 2019-09-17 DIAGNOSIS — Q72892 Other reduction defects of left lower limb: Secondary | ICD-10-CM | POA: Diagnosis not present

## 2019-09-17 DIAGNOSIS — M9903 Segmental and somatic dysfunction of lumbar region: Secondary | ICD-10-CM | POA: Diagnosis not present

## 2019-09-17 DIAGNOSIS — M5137 Other intervertebral disc degeneration, lumbosacral region: Secondary | ICD-10-CM | POA: Diagnosis not present

## 2019-09-17 DIAGNOSIS — M9904 Segmental and somatic dysfunction of sacral region: Secondary | ICD-10-CM | POA: Diagnosis not present

## 2019-09-18 DIAGNOSIS — M9903 Segmental and somatic dysfunction of lumbar region: Secondary | ICD-10-CM | POA: Diagnosis not present

## 2019-09-18 DIAGNOSIS — M5432 Sciatica, left side: Secondary | ICD-10-CM | POA: Diagnosis not present

## 2019-09-18 DIAGNOSIS — M5431 Sciatica, right side: Secondary | ICD-10-CM | POA: Diagnosis not present

## 2019-09-18 DIAGNOSIS — M9904 Segmental and somatic dysfunction of sacral region: Secondary | ICD-10-CM | POA: Diagnosis not present

## 2019-09-18 DIAGNOSIS — M5137 Other intervertebral disc degeneration, lumbosacral region: Secondary | ICD-10-CM | POA: Diagnosis not present

## 2019-09-18 DIAGNOSIS — Q72892 Other reduction defects of left lower limb: Secondary | ICD-10-CM | POA: Diagnosis not present

## 2019-09-18 DIAGNOSIS — M9905 Segmental and somatic dysfunction of pelvic region: Secondary | ICD-10-CM | POA: Diagnosis not present

## 2019-09-20 DIAGNOSIS — M5432 Sciatica, left side: Secondary | ICD-10-CM | POA: Diagnosis not present

## 2019-09-20 DIAGNOSIS — M9904 Segmental and somatic dysfunction of sacral region: Secondary | ICD-10-CM | POA: Diagnosis not present

## 2019-09-20 DIAGNOSIS — M9905 Segmental and somatic dysfunction of pelvic region: Secondary | ICD-10-CM | POA: Diagnosis not present

## 2019-09-20 DIAGNOSIS — M5137 Other intervertebral disc degeneration, lumbosacral region: Secondary | ICD-10-CM | POA: Diagnosis not present

## 2019-09-20 DIAGNOSIS — Q72892 Other reduction defects of left lower limb: Secondary | ICD-10-CM | POA: Diagnosis not present

## 2019-09-20 DIAGNOSIS — M5431 Sciatica, right side: Secondary | ICD-10-CM | POA: Diagnosis not present

## 2019-09-20 DIAGNOSIS — M9903 Segmental and somatic dysfunction of lumbar region: Secondary | ICD-10-CM | POA: Diagnosis not present

## 2019-09-24 ENCOUNTER — Ambulatory Visit (INDEPENDENT_AMBULATORY_CARE_PROVIDER_SITE_OTHER): Payer: Medicare Other | Admitting: *Deleted

## 2019-09-24 DIAGNOSIS — I4819 Other persistent atrial fibrillation: Secondary | ICD-10-CM

## 2019-09-24 LAB — CUP PACEART REMOTE DEVICE CHECK
Date Time Interrogation Session: 20210730234518
Implantable Pulse Generator Implant Date: 20191008

## 2019-09-26 ENCOUNTER — Other Ambulatory Visit: Payer: Self-pay | Admitting: Physician Assistant

## 2019-09-27 NOTE — Progress Notes (Signed)
Carelink Summary Report / Loop Recorder 

## 2019-10-04 DIAGNOSIS — H25013 Cortical age-related cataract, bilateral: Secondary | ICD-10-CM | POA: Diagnosis not present

## 2019-10-04 DIAGNOSIS — E119 Type 2 diabetes mellitus without complications: Secondary | ICD-10-CM | POA: Diagnosis not present

## 2019-10-04 LAB — HM DIABETES EYE EXAM

## 2019-10-05 ENCOUNTER — Encounter: Payer: Self-pay | Admitting: Physician Assistant

## 2019-10-21 ENCOUNTER — Encounter: Payer: Self-pay | Admitting: Emergency Medicine

## 2019-10-21 ENCOUNTER — Other Ambulatory Visit: Payer: Self-pay

## 2019-10-21 ENCOUNTER — Ambulatory Visit
Admission: EM | Admit: 2019-10-21 | Discharge: 2019-10-21 | Disposition: A | Discharge status: Home / Self Care | Payer: Medicare Other | Attending: Emergency Medicine | Admitting: Emergency Medicine

## 2019-10-21 DIAGNOSIS — Z1152 Encounter for screening for COVID-19: Secondary | ICD-10-CM | POA: Diagnosis not present

## 2019-10-21 DIAGNOSIS — J069 Acute upper respiratory infection, unspecified: Secondary | ICD-10-CM | POA: Diagnosis not present

## 2019-10-21 MED ORDER — FLUTICASONE PROPIONATE 50 MCG/ACT NA SUSP
1.0000 | Freq: Every day | NASAL | 0 refills | Status: DC
Start: 2019-10-21 — End: 2020-03-26

## 2019-10-21 MED ORDER — ALBUTEROL SULFATE HFA 108 (90 BASE) MCG/ACT IN AERS
2.0000 | INHALATION_SPRAY | RESPIRATORY_TRACT | 0 refills | Status: DC | PRN
Start: 2019-10-21 — End: 2020-02-01

## 2019-10-21 MED ORDER — CETIRIZINE HCL 10 MG PO TABS: 10.0000 mg | ORAL_TABLET | Freq: Every day | ORAL | 0 refills | Status: DC

## 2019-10-21 MED ORDER — BENZONATATE 100 MG PO CAPS
100.0000 mg | ORAL_CAPSULE | Freq: Three times a day (TID) | ORAL | 0 refills | Status: DC
Start: 2019-10-21 — End: 2020-01-14

## 2019-10-21 MED ORDER — PREDNISONE 20 MG PO TABS
20.0000 mg | ORAL_TABLET | Freq: Every day | ORAL | 0 refills | Status: DC
Start: 2019-10-21 — End: 2020-01-14

## 2019-10-21 NOTE — Discharge Instructions (Addendum)

## 2019-10-21 NOTE — ED Provider Notes (Signed)
[facility]    CSN: [id number] Arrival date & time: 10/21/19  1144      History   Chief Complaint Chief Complaint  Patient presents with  . Nasal Congestion  . Cough    HPI Ashley Woodard is a 69 y.o. female  Subjective:   Ashley Woodard is a 69 y.o. female here for evaluation of a cough.  The cough is non-productive, without wheezing, dyspnea or hemoptysis, worsening over time and is aggravated by cold air and dust. Onset of symptoms was 1 week ago, gradually worsening since that time.  Associated symptoms include postnasal drip. Patient does not have a history of asthma. Patient has not had recent travel. Patient does not have a history of smoking. Patient  has not had a previous chest x-ray. Patient has not had a PPD done. The following portions of the patient's history were reviewed and updated as appropriate: allergies, current medications, past family history, past medical history, past social history, past surgical history and problem list.     Past Medical History:  Diagnosis Date  . Abnormal cardiovascular stress test 08/21/2016   a. done for abnl EKG/cardiac risk factors -> admitted for cath 07/2016 which showed no significant CAD, normal LV contraction, mildly elevated filling pressure. Low dose Imdur was added for possible component of microvascular dysfunction.   . Allergy   . Anxiety   . Arthritis    "back, knees, fingers" (05/10/2017)  . Chronic lower back pain   . Colon polyps   . Deafness in right ear   . Depression   . Diverticulosis   . Hx of adenomatous colonic polyps 05/08/2003  . Hypertension   . Hypertriglyceridemia   . Hypothyroidism   . Migraine    "none in the 2000s" (05/10/2017)  . Osteoporosis   . Paroxysmal atrial fibrillation (HCC)   . Pneumonia    "several times" (05/10/2017)  . PONV (postoperative nausea and vomiting)   . S/P cardiac catheterization, 08/20/16 minimal CAD 08/21/2016  . Type II diabetes mellitus (HCC)   . Voice  tremor     Patient Active Problem List   Diagnosis Date Noted  . Paroxysmal atrial fibrillation (HCC) 05/10/2017  . Hypothyroidism 02/24/2017  . 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 S/P cardiac catheterization, 08/20/16 minimal CAD  08/21/2016  . Unstable angina (HCC)   . DDD (degenerative disc disease), lumbar 06/29/2016  . Meniere's disease 06/29/2016  . Diabetes mellitus, type 2 (HCC) 09/30/2014  . Hyperlipemia 09/30/2014  . Hypertension 09/30/2014  . Hx of adenomatous colonic polyps 05/08/2003    Past Surgical History:  Procedure Laterality Date  . ABDOMINAL HYSTERECTOMY  1988  . ATRIAL FIBRILLATION ABLATION N/A 05/10/2017   Procedure: ATRIAL FIBRILLATION ABLATION;  Surgeon: Hillis RangeAllred, James, MD;  Location: [facility];  Service: Cardiovascular;  Laterality: N/A;  . COCHLEAR IMPLANT Right 2008  . COLONOSCOPY    . LEFT HEART CATH AND CORONARY ANGIOGRAPHY N/A 08/20/2016   Procedure: Left Heart Cath and Coronary Angiography;  Surgeon: Yvonne KendallEnd, Christopher, MD;  Location: [facility];  Service: Cardiovascular;  Laterality: N/A;  . LOOP RECORDER INSERTION N/A 11/29/2017   Procedure: LOOP RECORDER INSERTION;  Surgeon: Hillis RangeAllred, James, MD;  Location: [facility];  Service: Cardiovascular;  Laterality: N/A;  . ORIF ANKLE FRACTURE Right 02/06/2019   Procedure: OPEN REDUCTION INTERNAL FIXATION (ORIF) ANKLE FRACTURE LATERAL MALLEOLUS, SYNDESMOSIS, RIGHT;  Surgeon: Terance HartAdair, Christopher R, MD;  Location: [facility];  Service: Orthopedics;  Laterality:  Right;  SURGERY REQUEST TIME: 1.5 HOURS  CPT CODES: 27792, 27829, 28768, 11572    OB History   No obstetric history on file.      Home Medications    Prior to Admission medications   Medication Sig Start Date End Date Taking? Authorizing Provider  pantoprazole (PROTONIX) 40 MG tablet TAKE 1 TABLET BY MOUTH EVERY DAY 07/27/19   Everrett Coombe, DO  albuterol (VENTOLIN HFA) 108 (90 Base) MCG/ACT inhaler Inhale 2 puffs  into the lungs every 4 (four) hours as needed for wheezing or shortness of breath. 10/21/19   Hall-Potvin, [place], PA-C  amLODipine (NORVASC) 10 MG tablet TAKE 1 TABLET DAILY 09/27/19   Waldon Merl, PA-C  aspirin EC 81 MG tablet Take 1 tablet (81 mg total) by mouth daily. 01/26/19   Dartha Lodge, PA-C  azelastine (ASTELIN) 0.1 % nasal spray Place 1 spray into both nostrils 2 (two) times daily. Use in each nostril as directed 07/25/19   Waldon Merl, PA-C  benzonatate (TESSALON) 100 MG capsule Take 1 capsule (100 mg total) by mouth every 8 (eight) hours. 10/21/19   Hall-Potvin, [place], PA-C  cetirizine (ZYRTEC ALLERGY) 10 MG tablet Take 1 tablet (10 mg total) by mouth daily. 10/21/19   Hall-Potvin, [place], PA-C  Cholecalciferol (VITAMIN D) 2000 units CAPS Take 1 capsule by mouth daily.     [provider]  doxylamine, Sleep, (SLEEP AID) 25 MG tablet Take 25 mg by mouth at bedtime.    [provider]  DULoxetine (CYMBALTA) 30 MG capsule TAKE 1 CAPSULE DAILY 07/02/19   Waldon Merl, PA-C  fluticasone [facility]) 50 MCG/ACT nasal spray Place 1 spray into both nostrils daily. 10/21/19   Hall-Potvin, [place], PA-C  glucose blood (CONTOUR NEXT TEST) test strip Check blood sugars once daily 06/15/17   Waldon Merl, PA-C  hydrALAZINE (APRESOLINE) 50 MG tablet Take 1 tablet (50 mg total) by mouth 2 (two) times daily. 09/05/19   Waldon Merl, PA-C  hydrochlorothiazide (HYDRODIURIL) 25 MG tablet TAKE 1 TABLET DAILY 04/17/19   Allred, Fayrene Fearing, MD  irbesartan (AVAPRO) 150 MG tablet TAKE 1 TABLET DAILY 04/17/19   Allred, Fayrene Fearing, MD  KLOR-CON M20 20 MEQ tablet TAKE 1 TABLET TWICE A DAY 09/03/19   Allred, Fayrene Fearing, MD  LANCETS ULTRA THIN 30G MISC 1 Stick by Does not apply route daily. 06/15/17   Waldon Merl, PA-C  levothyroxine (SYNTHROID) 75 MCG tablet TAKE 1 TABLET DAILY 09/03/19   Waldon Merl, PA-C  levothyroxine (SYNTHROID) 88 MCG tablet TAKE 1 TABLET BY MOUTH EVERY DAY  07/19/19   Waldon Merl, PA-C  lovastatin (MEVACOR) 20 MG tablet TAKE 1 TABLET BY MOUTH EVERYDAY AT BEDTIME 07/09/19   Waldon Merl, PA-C  Magnesium 200 MG TABS Take 200 mg by mouth daily.     [provider]  metFORMIN (GLUCOPHAGE) 500 MG tablet TAKE 1 TABLET TWICE DAILY  WITH MEALS 09/03/19   Waldon Merl, PA-C  Omega-3 Fatty Acids (FISH OIL) 1000 MG CAPS Take 1,000 mg by mouth daily.     [provider]  predniSONE (DELTASONE) 20 MG tablet Take 1 tablet (20 mg total) by mouth daily. 10/21/19   Hall-Potvin, [place], PA-C  vitamin B-12 (CYANOCOBALAMIN) 1000 MCG tablet Take 1 tablet (1,000 mcg total) by mouth daily. 11/08/16   Waldon Merl, PA-C    Family History Family History  Problem Relation Age of Onset  . Hypertension Mother   . Diabetes Mother   .  Heart attack Mother   . Colon polyps Mother   . Hypertension Father   . Heart attack Father   . Stroke Father   . Breast cancer Sister 22  . Hypertension Brother   . Cancer Brother        Prostate  . Diabetes Brother   . Dementia Brother   . Stroke Brother   . Heart attack Brother   . Prostate cancer Brother   . Hypertension Son   . Breast cancer Maternal Aunt        in 77's  . Cancer Maternal Uncle        Lung  . Colon cancer Neg Hx   . Esophageal cancer Neg Hx   . Rectal cancer Neg Hx   . Stomach cancer Neg Hx     Social History Social History   Tobacco Use  . Smoking status: Never Smoker  . Smokeless tobacco: Never Used  Vaping Use  . Vaping Use: Never used  Substance Use Topics  . Alcohol use: No  . Drug use: No     Allergies   Demeclocycline, Doxycycline, Erythromycin, Lisinopril, and Tetracyclines & related   Review of Systems As per HPI   Physical Exam Triage Vital Signs ED Triage Vitals  Enc Vitals Group     BP 10/21/19 1424 136/82     Pulse Rate 10/21/19 1424 76     Resp 10/21/19 1424 18     Temp 10/21/19 1424 98.6 F (37 C)     Temp Source 10/21/19 1424  Oral     SpO2 10/21/19 1424 97 %     Weight --      Height --      Head Circumference --      Peak Flow --      Pain Score 10/21/19 1427 4     Pain Loc --      Pain Edu? --      Excl. in GC? --    No data found.  Updated Vital Signs BP 136/82 (BP Location: Left Arm)   Pulse 76   Temp 98.6 F (37 C) (Oral)   Resp 18   SpO2 97%   Visual Acuity Right Eye Distance:   Left Eye Distance:   Bilateral Distance:    Right Eye Near:   Left Eye Near:    Bilateral Near:     Physical Exam Constitutional:      General: She is not in acute distress.    Appearance: She is obese. She is not ill-appearing or diaphoretic.  HENT:     Head: Normocephalic and atraumatic.     Mouth/Throat:     Mouth: Mucous membranes are moist.     Pharynx: Oropharynx is clear. No oropharyngeal exudate or posterior oropharyngeal erythema.  Eyes:     General: No scleral icterus.    Conjunctiva/sclera: Conjunctivae normal.     Pupils: Pupils are equal, round, and reactive to light.  Neck:     Comments: Trachea midline, negative JVD Cardiovascular:     Rate and Rhythm: Normal rate and regular rhythm.     Heart sounds: No murmur heard.  No gallop.   Pulmonary:     Effort: Pulmonary effort is normal. No respiratory distress.     Breath sounds: No wheezing, rhonchi or rales.  Musculoskeletal:     Cervical back: Neck supple. No tenderness.  Lymphadenopathy:     Cervical: No cervical adenopathy.  Skin:    Capillary Refill: Capillary refill takes  less than 2 seconds.     Coloration: Skin is not jaundiced or pale.     Findings: No rash.  Neurological:     General: No focal deficit present.     Mental Status: She is alert and oriented to person, place, and time.      UC Treatments / Results  Labs (all labs ordered are listed, but only abnormal results are displayed) Labs Reviewed  NOVEL CORONAVIRUS, NAA    EKG   Radiology No results found.  Procedures Procedures (including critical care  time)  Medications Ordered in UC Medications - No data to display  Initial Impression / Assessment and Plan / UC Course  I have reviewed the triage vital signs and the nursing notes.  Pertinent labs & imaging results that were available during my care of the patient were reviewed by me and considered in my medical decision making (see chart for details).     Patient afebrile, nontoxic, with SpO2 97%.  Covid PCR pending.  Patient to quarantine until results are back.  We will treat supportively as outlined below.  Pt declined CXR, requesting prednisone.  Has tolerated well in past.  Discussed risks/benefits given pt's medical hx.  Return precautions discussed, patient verbalized understanding and is agreeable to plan. Final Clinical Impressions(s) / UC Diagnoses   Final diagnoses:  Encounter for screening for COVID-19  URI with cough and congestion     Discharge Instructions     Tessalon for cough. Start flonase, atrovent nasal spray for nasal congestion/drainage. You can use over the counter nasal saline rinse such as neti pot for nasal congestion. Keep hydrated, your urine should be clear to pale yellow in color. Tylenol/motrin for fever and pain. Monitor for any worsening of symptoms, chest pain, shortness of breath, wheezing, swelling of the throat, go to the emergency department for further evaluation needed.     ED Prescriptions    Medication Sig Dispense Auth. Provider   cetirizine (ZYRTEC ALLERGY) 10 MG tablet Take 1 tablet (10 mg total) by mouth daily. 30 tablet Hall-Potvin, [place], PA-C   fluticasone (FLONASE) 50 MCG/ACT nasal spray Place 1 spray into both nostrils daily. 16 g Hall-Potvin, [place], PA-C   albuterol (VENTOLIN HFA) 108 (90 Base) MCG/ACT inhaler Inhale 2 puffs into the lungs every 4 (four) hours as needed for wheezing or shortness of breath. 18 g Hall-Potvin, [place], PA-C   benzonatate (TESSALON) 100 MG capsule Take 1 capsule (100 mg total) by mouth every 8  (eight) hours. 21 capsule Hall-Potvin, [place], PA-C   predniSONE (DELTASONE) 20 MG tablet Take 1 tablet (20 mg total) by mouth daily. 5 tablet Hall-Potvin, [place], PA-C     PDMP not reviewed this encounter.   Hall-Potvin, [place], [place] 10/21/19 1506

## 2019-10-21 NOTE — ED Triage Notes (Signed)
Pt here with nasal congestion and sinus pressure and cough x 1 week; denies fever had negative covid swab on Monday

## 2019-10-22 LAB — NOVEL CORONAVIRUS, NAA: SARS-CoV-2, NAA: DETECTED — AB

## 2019-10-23 ENCOUNTER — Telehealth: Payer: Self-pay | Admitting: Oncology

## 2019-10-23 ENCOUNTER — Encounter: Payer: Self-pay | Admitting: Oncology

## 2019-10-23 ENCOUNTER — Other Ambulatory Visit: Payer: Self-pay | Admitting: Oncology

## 2019-10-23 DIAGNOSIS — U071 COVID-19: Secondary | ICD-10-CM

## 2019-10-23 NOTE — Telephone Encounter (Signed)
Called to Discuss with patient about Covid symptoms and the use of regeneron, a monoclonal antibody infusion for those with mild to moderate Covid symptoms and at a high risk of hospitalization.     Pt is qualified for this infusion at the [facility] infusion center due to co-morbid conditions and/or a member of an at-risk group.     Spoke to patient about Mab infusion.  All questions answered.  States she is feeling okay right now.  She is on day 7 or 8 of her symptoms.  Explained that she is only eligible for infusion up until the 10th day.  She would like to think about it and get back to [place].  Hotline information given.  She is also waiting to see if her husband test positive for Covid.  He should get his results back today.  Ashley Woodard, AGNP-C [phone] (Infusion Center Hotline)

## 2019-10-23 NOTE — Progress Notes (Signed)
I connected by phone with  Mrs. Allport to discuss the potential use of an new treatment for mild to moderate COVID-19 viral infection in non-hospitalized patients.   This patient is a age/sex that meets the FDA criteria for Emergency Use Authorization of casirivimab\imdevimab.  Has a (+) direct SARS-CoV-2 viral test result 1. Has mild or moderate COVID-19  2. Is ? 68 years of age and weighs ? 40 kg 3. Is NOT hospitalized due to COVID-19 4. Is NOT requiring oxygen therapy or requiring an increase in baseline oxygen flow rate due to COVID-19 5. Is within 10 days of symptom onset 6. Has at least one of the high risk factor(s) for progression to severe COVID-19 and/or hospitalization as defined in EUA. ? Specific high risk criteria :age    Symptom onset  10/16/19   I have spoken and communicated the following to the patient or parent/caregiver:   1. FDA has authorized the emergency use of casirivimab\imdevimab for the treatment of mild to moderate COVID-19 in adults and pediatric patients with positive results of direct SARS-CoV-2 viral testing who are 68 years of age and older weighing at least 40 kg, and who are at high risk for progressing to severe COVID-19 and/or hospitalization.   2. The significant known and potential risks and benefits of casirivimab\imdevimab, and the extent to which such potential risks and benefits are unknown.   3. Information on available alternative treatments and the risks and benefits of those alternatives, including clinical trials.   4. Patients treated with casirivimab\imdevimab should continue to self-isolate and use infection control measures (e.g., wear mask, isolate, social distance, avoid sharing personal items, clean and disinfect "high touch" surfaces, and frequent handwashing) according to CDC guidelines.    5. The patient or parent/caregiver has the option to accept or refuse casirivimab\imdevimab .   After reviewing this information with the patient,  The patient agreed to proceed with receiving casirivimab\imdevimab infusion and will be provided a copy of the Fact sheet prior to receiving the infusion.Mignon Pine, AGNP-C [phone] (Infusion Center Hotline)

## 2019-10-24 ENCOUNTER — Ambulatory Visit (HOSPITAL_COMMUNITY)
Admission: RE | Admit: 2019-10-24 | Discharge: 2019-10-24 | Disposition: A | Discharge status: Home / Self Care | Payer: Medicare Other | Source: Ambulatory Visit | Attending: Pulmonary Disease | Admitting: Pulmonary Disease

## 2019-10-24 DIAGNOSIS — Z23 Encounter for immunization: Secondary | ICD-10-CM | POA: Diagnosis not present

## 2019-10-24 DIAGNOSIS — U071 COVID-19: Secondary | ICD-10-CM | POA: Diagnosis not present

## 2019-10-24 MED ORDER — SODIUM CHLORIDE 0.9 % IV SOLN
1200.0000 mg | Freq: Once | INTRAVENOUS | Status: AC
  Administered 2019-10-24: 1200 mg via INTRAVENOUS

## 2019-10-24 MED ORDER — SODIUM CHLORIDE 0.9 % IV SOLN: INTRAVENOUS | Status: DC | PRN

## 2019-10-24 MED ORDER — FAMOTIDINE IN NACL 20-0.9 MG/50ML-% IV SOLN: 20.0000 mg | Freq: Once | INTRAVENOUS | Status: DC | PRN

## 2019-10-24 MED ORDER — DIPHENHYDRAMINE HCL 50 MG/ML IJ SOLN: 50.0000 mg | Freq: Once | INTRAMUSCULAR | Status: DC | PRN

## 2019-10-24 MED ORDER — ALBUTEROL SULFATE HFA 108 (90 BASE) MCG/ACT IN AERS: 2.0000 | INHALATION_SPRAY | Freq: Once | RESPIRATORY_TRACT | Status: DC | PRN

## 2019-10-24 MED ORDER — METHYLPREDNISOLONE SODIUM SUCC 125 MG IJ SOLR: 125.0000 mg | Freq: Once | INTRAMUSCULAR | Status: DC | PRN

## 2019-10-24 MED ORDER — EPINEPHRINE 0.3 MG/0.3ML IJ SOAJ: 0.3000 mg | Freq: Once | INTRAMUSCULAR | Status: DC | PRN

## 2019-10-24 NOTE — Discharge Instructions (Signed)
10 Things You Can Do to Manage Your COVID-19 Symptoms at Home If you have possible or confirmed COVID-19: 1. Stay home from work and school. And stay away from other public places. If you must go out, avoid using any kind of public transportation, ridesharing, or taxis. 2. Monitor your symptoms carefully. If your symptoms get worse, call your healthcare provider immediately. 3. Get rest and stay hydrated. 4. If you have a medical appointment, call the healthcare provider ahead of time and tell them that you have or may have COVID-19. 5. For medical emergencies, call 911 and notify the dispatch personnel that you have or may have COVID-19. 6. Cover your cough and sneezes with a tissue or use the inside of your elbow. 7. Wash your hands often with soap and water for at least 20 seconds or clean your hands with an alcohol-based hand sanitizer that contains at least 60% alcohol. 8. As much as possible, stay in a specific room and away from other people in your home. Also, you should use a separate bathroom, if available. If you need to be around other people in or outside of the home, wear a mask. 9. Avoid sharing personal items with other people in your household, like dishes, towels, and bedding. 10. Clean all surfaces that are touched often, like counters, tabletops, and doorknobs. Use household cleaning sprays or wipes according to the label instructions. [email or web address] 08/23/2018 This information is not intended to replace advice given to you by your health care provider. Make sure you discuss any questions you have with your health care provider. Document Revised: 01/25/2019 Document Reviewed: 01/25/2019.B Elsevier Patient Education  [employer]. 10 Things You Can Do to Manage Your COVID-19 Symptoms at Home If you have possible or confirmed COVID-19: 11. Stay home from work and school. And stay away from other public places. If you must go out, avoid using any kind of public  transportation, ridesharing, or taxis. 12. Monitor your symptoms carefully. If your symptoms get worse, call your healthcare provider immediately. 13. Get rest and stay hydrated. 14. If you have a medical appointment, call the healthcare provider ahead of time and tell them that you have or may have COVID-19. 15. For medical emergencies, call 911 and notify the dispatch personnel that you have or may have COVID-19. 16. Cover your cough and sneezes with a tissue or use the inside of your elbow. 17. Wash your hands often with soap and water for at least 20 seconds or clean your hands with an alcohol-based hand sanitizer that contains at least 60% alcohol. 18. As much as possible, stay in a specific room and away from other people in your home. Also, you should use a separate bathroom, if available. If you need to be around other people in or outside of the home, wear a mask. 19. Avoid sharing personal items with other people in your household, like dishes, towels, and bedding. Clean all surfaces that are touched often, like counters, tabletops, and doorknobs. Use household cleaning sprays or wipes according to the label instructions.What types of side effects do monoclonal antibody drugs cause?  Common side effects  In general, the more common side effects caused by monoclonal antibody drugs include: Allergic reactions, such as hives or itching Flu-like signs and symptoms, including chills, fatigue, fever, and muscle aches and pains Nausea, vomiting Diarrhea Skin rashes Low blood pressure   The CDC is recommending patients who receive monoclonal antibody treatments wait at least 90 days before being vaccinated.  20. Currently, there are no data on the safety and efficacy of mRNA COVID-19 vaccines in persons who received monoclonal antibodies or convalescent plasma as part of COVID-19 treatment. Based on the estimated half-life of such therapies as well as evidence suggesting that reinfection is  uncommon in the 90 days after initial infection, vaccination should be deferred for at least 90 days, as a precautionary measure until additional information becomes available, to avoid interference of the antibody treatment with vaccine-induced immune responses. [email or web address] 08/23/2018 This information is not intended to replace advice given to you by your health care provider. Make sure you discuss any questions you have with your health care provider. Document Revised: 01/25/2019 Document Reviewed: 01/25/2019 Elsevier Patient Education  [place]. What types of side effects do monoclonal antibody drugs cause?  Common side effects  In general, the more common side effects caused by monoclonal antibody drugs include: . Allergic reactions, such as hives or itching . Flu-like signs and symptoms, including chills, fatigue, fever, and muscle aches and pains . Nausea, vomiting . Diarrhea . Skin rashes . Low blood pressure   The CDC is recommending patients who receive monoclonal antibody treatments wait at least 90 days before being vaccinated.  Currently, there are no data on the safety and efficacy of mRNA COVID-19 vaccines in persons who received monoclonal antibodies or convalescent plasma as part of COVID-19 treatment. Based on the estimated half-life of such therapies as well as evidence suggesting that reinfection is uncommon in the 90 days after initial infection, vaccination should be deferred for at least 90 days, as a precautionary measure until additional information becomes available, to avoid interference of the antibody treatment with vaccine-induced immune responses.

## 2019-10-24 NOTE — Progress Notes (Signed)
  Diagnosis: COVID-19  Physician: Patrick Wright, MD  Procedure: [facility] Infusion Clinic Med: casirivimab\imdevimab infusion - Provided patient with casirivimab\imdevimab fact sheet for patients, parents and caregivers prior to infusion.  Complications: No immediate complications noted.  Discharge: Discharged home   Ashley Woodard 10/24/2019  

## 2019-10-28 LAB — CUP PACEART REMOTE DEVICE CHECK
Date Time Interrogation Session: 20210901234851
Implantable Pulse Generator Implant Date: 20191008

## 2019-10-30 ENCOUNTER — Ambulatory Visit (INDEPENDENT_AMBULATORY_CARE_PROVIDER_SITE_OTHER): Payer: Medicare Other | Admitting: *Deleted

## 2019-10-30 DIAGNOSIS — I480 Paroxysmal atrial fibrillation: Secondary | ICD-10-CM | POA: Diagnosis not present

## 2019-10-31 NOTE — Progress Notes (Signed)
Carelink Summary Report / Loop Recorder 

## 2019-11-30 LAB — CUP PACEART REMOTE DEVICE CHECK
Date Time Interrogation Session: 20211004235035
Implantable Pulse Generator Implant Date: 20191008

## 2019-12-03 ENCOUNTER — Ambulatory Visit (INDEPENDENT_AMBULATORY_CARE_PROVIDER_SITE_OTHER): Payer: Medicare Other

## 2019-12-03 DIAGNOSIS — I480 Paroxysmal atrial fibrillation: Secondary | ICD-10-CM | POA: Diagnosis not present

## 2019-12-05 NOTE — Progress Notes (Signed)
Carelink Summary Report / Loop Recorder 

## 2019-12-06 ENCOUNTER — Other Ambulatory Visit: Payer: Self-pay | Admitting: Physician Assistant

## 2019-12-12 DIAGNOSIS — Z23 Encounter for immunization: Secondary | ICD-10-CM | POA: Diagnosis not present

## 2019-12-13 ENCOUNTER — Other Ambulatory Visit: Payer: Self-pay | Admitting: Emergency Medicine

## 2019-12-13 DIAGNOSIS — E039 Hypothyroidism, unspecified: Secondary | ICD-10-CM

## 2019-12-13 MED ORDER — LEVOTHYROXINE SODIUM 88 MCG PO TABS: 88.0000 ug | ORAL_TABLET | Freq: Every day | ORAL | 2 refills | Status: DC

## 2019-12-14 ENCOUNTER — Other Ambulatory Visit: Payer: Self-pay | Admitting: Physician Assistant

## 2019-12-23 ENCOUNTER — Other Ambulatory Visit: Payer: Self-pay | Admitting: Internal Medicine

## 2019-12-28 ENCOUNTER — Telehealth: Payer: Self-pay | Admitting: Physician Assistant

## 2019-12-28 NOTE — Telephone Encounter (Signed)
Left detailed message on VM that patient would need to be seen for evaluated for the dizziness. We can see her next week or go to urgent care or Saturday clinic for treatment/medication

## 2019-12-28 NOTE — Telephone Encounter (Signed)
Pt's husband called in stating that she is having a lot of Dizziness she believes it could be a Meniere attack. The provider she was seeing for this before has retired. He wanted to know if Ashley Woodard could send something in her here. Pt can be reached at the home # and she uses [facility] on [address].

## 2019-12-30 LAB — CUP PACEART REMOTE DEVICE CHECK
Date Time Interrogation Session: 20211107003745
Implantable Pulse Generator Implant Date: 20191008

## 2020-01-07 ENCOUNTER — Ambulatory Visit (INDEPENDENT_AMBULATORY_CARE_PROVIDER_SITE_OTHER): Payer: Medicare Other

## 2020-01-07 DIAGNOSIS — I480 Paroxysmal atrial fibrillation: Secondary | ICD-10-CM

## 2020-01-08 NOTE — Progress Notes (Signed)
Carelink Summary Report / Loop Recorder 

## 2020-01-11 NOTE — Progress Notes (Signed)
Subjective:   Ashley Woodard is a 69 y.o. female who presents for Medicare Annual (Subsequent) preventive examination.  I connected with Pauleen today by telephone and verified that I am speaking with the correct person using two identifiers. Location patient: home Location provider: work Persons participating in the virtual visit: patient, Engineer, civil (consulting).    I discussed the limitations, risks, security and privacy concerns of performing an evaluation and management service by telephone and the availability of in person appointments. I also discussed with the patient that there may be a patient responsible charge related to this service. The patient expressed understanding and verbally consented to this telephonic visit.    Interactive audio and video telecommunications were attempted between this provider and patient, however failed, due to patient having technical difficulties OR patient did not have access to video capability.  We continued and completed visit with audio only.  Some vital signs may be absent or patient reported.   Time Spent with patient on telephone encounter: 20 minutes  Review of Systems     Cardiac Risk Factors include: advanced age (>91men, >68 women);diabetes mellitus;dyslipidemia;hypertension;obesity (BMI >30kg/m2);sedentary lifestyle     Objective:    Today's Vitals   01/14/20 1547  Weight: 199 lb (90.3 kg)  Height:  (1.702 m)   Body mass index is 31.17 kg/m.  Advanced Directives 01/14/2020 02/06/2019 01/31/2019 01/26/2019 01/05/2019 11/03/2018 05/10/2017  Does Patient Have a Medical Advance Directive? Yes Yes Yes Yes Yes Yes -  Type of Estate agent of [place];Living will Healthcare Power of [place];Living will - Healthcare Power of [place];Living will Healthcare Power of [place];Living will Living will;Healthcare Power of [employer] Power of [place];Living will  Does patient want to make changes to medical advance directive? -  - - No - Patient declined - - -  Copy of Healthcare Power of Attorney in Chart? No - copy requested No - copy requested - No - copy requested No - copy requested No - copy requested No - copy requested  Would patient like information on creating a medical advance directive? - - - No - Patient declined - - -    Current Medications (verified) Outpatient Encounter Medications as of 01/14/2020  Medication Sig  . amLODipine (NORVASC) 10 MG tablet TAKE 1 TABLET DAILY  . cetirizine (ZYRTEC ALLERGY) 10 MG tablet Take 1 tablet (10 mg total) by mouth daily.  . DULoxetine (CYMBALTA) 30 MG capsule TAKE 1 CAPSULE DAILY  . fluticasone (FLONASE) 50 MCG/ACT nasal spray Place 1 spray into both nostrils daily.  Marland Kitchen glucose blood (CONTOUR NEXT TEST) test strip Check blood sugars once daily  . hydrALAZINE (APRESOLINE) 50 MG tablet Take 1 tablet (50 mg total) by mouth 2 (two) times daily.  . hydrochlorothiazide (HYDRODIURIL) 25 MG tablet Take 1 tablet (25 mg total) by mouth daily. Please keep upcoming appt in January 2022 with Dr. Johney Frame before anymore refills. Thank you  . irbesartan (AVAPRO) 150 MG tablet Take 1 tablet (150 mg total) by mouth daily. Please keep upcoming appt in January 2022 with Dr. Johney Frame for future refills. Thank you  . KLOR-CON M20 20 MEQ tablet TAKE 1 TABLET TWICE A DAY  . LANCETS ULTRA THIN 30G MISC 1 Stick by Does not apply route daily.  Marland Kitchen levothyroxine (SYNTHROID) 88 MCG tablet Take 1 tablet (88 mcg total) by mouth daily.  Marland Kitchen lovastatin (MEVACOR) 20 MG tablet TAKE 1 TABLET BY MOUTH EVERYDAY AT BEDTIME  . Magnesium 200 MG TABS Take 200 mg by  mouth daily.   . metFORMIN (GLUCOPHAGE) 500 MG tablet TAKE 1 TABLET TWICE DAILY  WITH MEALS  . Omega-3 Fatty Acids (FISH OIL) 1000 MG CAPS Take 1,000 mg by mouth daily.   . pantoprazole (PROTONIX) 40 MG tablet TAKE 1 TABLET BY MOUTH EVERY DAY  . vitamin B-12 (CYANOCOBALAMIN) 1000 MCG tablet Take 1 tablet (1,000 mcg total) by mouth daily.  Marland Kitchen. albuterol  (VENTOLIN HFA) 108 (90 Base) MCG/ACT inhaler Inhale 2 puffs into the lungs every 4 (four) hours as needed for wheezing or shortness of breath. (Patient not taking: Reported on 01/14/2020)  . aspirin EC 81 MG tablet Take 1 tablet (81 mg total) by mouth daily.  Marland Kitchen. azelastine (ASTELIN) 0.1 % nasal spray Place 1 spray into both nostrils 2 (two) times daily. Use in each nostril as directed  . Cholecalciferol (VITAMIN D) 2000 units CAPS Take 1 capsule by mouth daily.   Marland Kitchen. doxylamine, Sleep, (SLEEP AID) 25 MG tablet Take 25 mg by mouth at bedtime.  . [DISCONTINUED] benzonatate (TESSALON) 100 MG capsule Take 1 capsule (100 mg total) by mouth every 8 (eight) hours.  . [DISCONTINUED] predniSONE (DELTASONE) 20 MG tablet Take 1 tablet (20 mg total) by mouth daily.   No facility-administered encounter medications on file as of 01/14/2020.    Allergies (verified) Demeclocycline, Doxycycline, Erythromycin, Lisinopril, and Tetracyclines & related   History: Past Medical History:  Diagnosis Date  . Abnormal cardiovascular stress test 08/21/2016   a. done for abnl EKG/cardiac risk factors -> admitted for cath 07/2016 which showed no significant CAD, normal LV contraction, mildly elevated filling pressure. Low dose Imdur was added for possible component of microvascular dysfunction.   . Allergy   . Anxiety   . Arthritis    "back, knees, fingers" (05/10/2017)  . Chronic lower back pain   . Colon polyps   . Deafness in right ear   . Depression   . Diverticulosis   . Hx of adenomatous colonic polyps 05/08/2003  . Hypertension   . Hypertriglyceridemia   . Hypothyroidism   . Migraine    "none in the 2000s" (05/10/2017)  . Osteoporosis   . Paroxysmal atrial fibrillation (HCC)   . Pneumonia    "several times" (05/10/2017)  . PONV (postoperative nausea and vomiting)   . S/P cardiac catheterization, 08/20/16 minimal CAD 08/21/2016  . Type II diabetes mellitus (HCC)   . Voice tremor    Past Surgical History:    Procedure Laterality Date  . ABDOMINAL HYSTERECTOMY  1988  . ATRIAL FIBRILLATION ABLATION N/A 05/10/2017   Procedure: ATRIAL FIBRILLATION ABLATION;  Surgeon: Hillis RangeAllred, James, MD;  Location: [facility];  Service: Cardiovascular;  Laterality: N/A;  . COCHLEAR IMPLANT Right 2008  . COLONOSCOPY    . LEFT HEART CATH AND CORONARY ANGIOGRAPHY N/A 08/20/2016   Procedure: Left Heart Cath and Coronary Angiography;  Surgeon: Yvonne KendallEnd, Christopher, MD;  Location: [facility];  Service: Cardiovascular;  Laterality: N/A;  . LOOP RECORDER INSERTION N/A 11/29/2017   Procedure: LOOP RECORDER INSERTION;  Surgeon: Hillis RangeAllred, James, MD;  Location: [facility];  Service: Cardiovascular;  Laterality: N/A;  . ORIF ANKLE FRACTURE Right 02/06/2019   Procedure: OPEN REDUCTION INTERNAL FIXATION (ORIF) ANKLE FRACTURE LATERAL MALLEOLUS, SYNDESMOSIS, RIGHT;  Surgeon: Terance HartAdair, Christopher R, MD;  Location: [facility];  Service: Orthopedics;  Laterality: Right;  SURGERY REQUEST TIME: 1.5 HOURS  CPT CODES: [id number], [id number], [id number], [id number]   Family History  Problem Relation Age of Onset  .  Hypertension Mother   . Diabetes Mother   . Heart attack Mother   . Colon polyps Mother   . Hypertension Father   . Heart attack Father   . Stroke Father   . Breast cancer Sister 66  . Hypertension Brother   . Cancer Brother        Prostate  . Diabetes Brother   . Dementia Brother   . Stroke Brother   . Heart attack Brother   . Prostate cancer Brother   . Hypertension Son   . Breast cancer Maternal Aunt        in 75's  . Cancer Maternal Uncle        Lung  . Colon cancer Neg Hx   . Esophageal cancer Neg Hx   . Rectal cancer Neg Hx   . Stomach cancer Neg Hx    Social History   Socioeconomic History  . Marital status: Married    Spouse name: Not on file  . Number of children: 2  . Years of education: 28  . Highest education level: Not on file  Occupational History  . Occupation: Art therapist   Tobacco Use  . Smoking status: Never Smoker  . Smokeless tobacco: Never Used  Vaping Use  . Vaping Use: Never used  Substance and Sexual Activity  . Alcohol use: No  . Drug use: No  . Sexual activity: Not Currently  Other Topics Concern  . Not on file  Social History Narrative   Lives at home with husband in [place].   Right-handed.   No caffeine use.   Manages a call center   Social Determinants of Health   Financial Resource Strain: Low Risk   . Difficulty of Paying Living Expenses: Not hard at all  Food Insecurity: No Food Insecurity  . Worried About Programme researcher, broadcasting/film/video in the Last Year: Never true  . Ran Out of Food in the Last Year: Never true  Transportation Needs: No Transportation Needs  . Lack of Transportation (Medical): No  . Lack of Transportation (Non-Medical): No  Physical Activity: Inactive  . Days of Exercise per Week: 0 days  . Minutes of Exercise per Session: 0 min  Stress: No Stress Concern Present  . Feeling of Stress : Not at all  Social Connections: Moderately Isolated  . Frequency of Communication with Friends and Family: More than three times a week  . Frequency of Social Gatherings with Friends and Family: More than three times a week  . Attends Religious Services: Never  . Active Member of Clubs or Organizations: No  . Attends Banker Meetings: Never  . Marital Status: Married    Tobacco Counseling Counseling given: Not Answered   Clinical Intake:  Pre-visit preparation completed: Yes  Pain : No/denies pain     Nutritional Status: BMI > 30  Obese Nutritional Risks: None Diabetes: Yes CBG done?: No Did pt. bring in CBG monitor from home?: No (phone visit)  How often do you need to have someone help you when you read instructions, pamphlets, or other written materials from your doctor or pharmacy?: 1 - Never What is the last grade level you completed in school?: 12th grade  Diabetes:  Is the patient diabetic?  Yes   If diabetic, was a CBG obtained today?  No  Did the patient bring in their glucometer from home?  No phone visit How often do you monitor your CBG's? never.   Financial Strains and Diabetes Management:  Are  you having any financial strains with the device, your supplies or your medication? No .  Does the patient want to be seen by Chronic Care Management for management of their diabetes?  No  Would the patient like to be referred to a Nutritionist or for Diabetic Management?  No   Diabetic Exams:  Diabetic Eye Exam: Completed 10/04/2019 .   Diabetic Foot Exam:  Pt has been advised about the importance in completing this exam. To be completed by PCP    Interpreter Needed?: No  Information entered by :: Lavell Anchors   Activities of Daily Living In your present state of health, do you have any difficulty performing the following activities: 01/14/2020 02/06/2019  Hearing? Y Y  Comment deaf right ear -  Vision? N N  Difficulty concentrating or making decisions? N N  Walking or climbing stairs? N Y  Dressing or bathing? N Y  Doing errands, shopping? N -  Preparing Food and eating ? N -  Using the Toilet? N -  In the past six months, have you accidently leaked urine? N -  Do you have problems with loss of bowel control? N -  Managing your Medications? N -  Managing your Finances? N -  Housekeeping or managing your Housekeeping? N -  Some recent data might be hidden    Patient Care Team: Noel Journey as PCP - General (Family Medicine) Hillis Range, MD as PCP - Electrophysiology (Cardiology) Marty Heck, OD as Physician Assistant (Optometry) Christia Reading, MD as Consulting Physician (Otolaryngology) Quintella Reichert, MD as Consulting Physician (Cardiology) Marty Heck, OD as Physician Assistant (Optometry) Dahlia Byes, [facility] as Pharmacist (Pharmacist)  Indicate any recent Medical Services you may have received from other than [facility]  providers in the past year (date may be approximate).     Assessment:   This is a routine wellness examination for [place].  Hearing/Vision screen  Hearing Screening   125Hz  250Hz  500Hz  1000Hz  2000Hz  3000Hz  4000Hz  6000Hz  8000Hz   Right ear:           Left ear:           Comments: Deaf in right ear  Vision Screening Comments: Reading glasses Last eye exam-09/2019  Dietary issues and exercise activities discussed: Current Exercise Habits: The patient does not participate in regular exercise at present, Exercise limited by: None identified  Goals    . Patient Stated     Would like to increase activity      Depression Screen PHQ 2/9 Scores 01/14/2020 01/05/2019 12/23/2017 10/11/2016 08/03/2016 06/29/2016  PHQ - 2 Score 0 0 0 0 0 0  PHQ- 9 Score - - 0 - 0 -    Fall Risk Fall Risk  01/14/2020 01/05/2019 01/05/2019 12/23/2017 10/11/2016  Falls in the past year? 1 0 0 0 No  Number falls in past yr: 0 0 0 0 -  Injury with Fall? 1 - 0 0 -  Risk for fall due to : History of fall(s) - - - -  Follow up Falls prevention discussed - Falls evaluation completed Falls evaluation completed -    Any stairs in or around the home? Yes  If so, are there any without handrails? No  Home free of loose throw rugs in walkways, pet beds, electrical cords, etc? Yes  Adequate lighting in your home to reduce risk of falls? Yes   ASSISTIVE DEVICES UTILIZED TO PREVENT FALLS:  Life alert? No  Use of a cane, walker or  w/c? No  Grab bars in the bathroom? Yes  Shower chair or bench in shower? No  Elevated toilet seat or a handicapped toilet? No   TIMED UP AND GO:  Was the test performed? No . Phone visit   Cognitive Function:No cognitive impairment noted MMSE - Mini Mental State Exam 01/05/2019  Orientation to time 5  Orientation to Place 5  Registration 3  Attention/ Calculation 2  Recall 3  Language- name 2 objects 2  Language- repeat 1  Language- follow 3 step command 3  Language- read & follow  direction 1  Write a sentence 1  Copy design 1  Total score 27        Immunizations Immunization History  Administered Date(s) Administered  . Fluad Quad(high Dose 65+) 12/14/2018, 01/31/2019  . Influenza, High Dose Seasonal PF 11/28/2016, 01/22/2018, 12/14/2018, 12/12/2019  . Influenza-Unspecified 12/10/2015, 11/11/2016, 11/22/2017  . PFIZER SARS-COV-2 Vaccination 04/22/2019, 05/14/2019, 12/12/2019  . Pneumococcal Conjugate-13 06/29/2016  . Pneumococcal Polysaccharide-23 12/04/2013, 01/05/2019  . Tdap 03/13/2014  . Zoster 03/13/2014    TDAP status: Up to date   Flu Vaccine status: Up to date   Pneumococcal vaccine status: Up to date   Covid-19 vaccine status: Completed vaccines  Qualifies for Shingles Vaccine? Yes   Zostavax completed Yes   Shingrix Completed?: No.    Education has been provided regarding the importance of this vaccine. Patient has been advised to call insurance company to determine out of pocket expense if they have not yet received this vaccine. Advised may also receive vaccine at local pharmacy or Health Dept. Verbalized acceptance and understanding.  Screening Tests Health Maintenance  Topic Date Due  . FOOT EXAM  04/28/2019  . HEMOGLOBIN A1C  01/03/2020  . DTAP VACCINES (1) 08/02/2078 (Originally 07/06/1950)  . OPHTHALMOLOGY EXAM  10/03/2020  . MAMMOGRAM  08/29/2021  . DTaP/Tdap/Td (2 - Td or Tdap) 03/13/2024  . TETANUS/TDAP  03/13/2024  . COLONOSCOPY  10/05/2025  . INFLUENZA VACCINE  Completed  . DEXA SCAN  Completed  . COVID-19 Vaccine  Completed  . Hepatitis C Screening  Completed  . PNA vac Low Risk Adult  Completed    Health Maintenance  Health Maintenance Due  Topic Date Due  . FOOT EXAM  04/28/2019  . HEMOGLOBIN A1C  01/03/2020    Colorectal cancer screening: Completed Colonoscopy 10/06/2018. Repeat every 7 years   Mammogram status: Completed Bilateral 08/30/2019. Repeat every year   Bone Density status: Due- Declined-Patient  wants to discuss with PCP  Lung Cancer Screening: (Low Dose CT Chest recommended if Age 68-80 years, 30 pack-year currently smoking OR have quit w/in 15years.) does not qualify.     Additional Screening:  Hepatitis C Screening: Completed 10/01/2015  Vision Screening: Recommended annual ophthalmology exams for early detection of glaucoma and other disorders of the eye. Is the patient up to date with their annual eye exam?  Yes  Who is the provider or what is the name of the office in which the patient attends annual eye exams? [facility]   Dental Screening: Recommended annual dental exams for proper oral hygiene  Community Resource Referral / Chronic Care Management: CRR required this visit?  No   CCM required this visit?  No      Plan:     I have personally reviewed and noted the following in the patient's chart:   . Medical and social history . Use of alcohol, tobacco or illicit drugs  . Current medications and supplements .  Functional ability and status . Nutritional status . Physical activity . Advanced directives . List of other physicians . Hospitalizations, surgeries, and ER visits in previous 12 months . Vitals . Screenings to include cognitive, depression, and falls . Referrals and appointments  In addition, I have reviewed and discussed with patient certain preventive protocols, quality metrics, and best practice recommendations. A written personalized care plan for preventive services as well as general preventive health recommendations were provided to patient.   Due to this being a telephonic visit, the after visit summary with patients personalized plan was offered to patient via mail or my-chart. Patient would like to access on my-chart.   Roanna Raider, LPN   07/62/2633  [facility]  Nurse Notes: None

## 2020-01-14 ENCOUNTER — Ambulatory Visit (INDEPENDENT_AMBULATORY_CARE_PROVIDER_SITE_OTHER): Payer: Medicare Other

## 2020-01-14 VITALS — Ht 67.0 in | Wt 199.0 lb

## 2020-01-14 DIAGNOSIS — Z0000 Encounter for general adult medical examination without abnormal findings: Secondary | ICD-10-CM | POA: Diagnosis not present

## 2020-01-14 NOTE — Patient Instructions (Signed)
Ashley Woodard , Thank you for taking time to complete your Medicare Wellness Visit. I appreciate your ongoing commitment to your health goals. Please review the following plan we discussed and let me know if I can assist you in the future.   Screening recommendations/referrals: Colonoscopy: Completed 10/06/2018-Due 10/05/2025 Mammogram: Completed 08/30/2019-Due 08/29/2020 Bone Density: Due-Declined- Discuss with at next visit. Recommended yearly ophthalmology/optometry visit for glaucoma screening and checkup Recommended yearly dental visit for hygiene and checkup  Vaccinations: Influenza vaccine: Up to date Pneumococcal vaccine: Completed vaccines Tdap vaccine: Up to date-Due-03/13/2024 Shingles vaccine: Discuss with pharmacy   Covid-19:Completed vaccines  Advanced directives: Please bring a copy for your chart  Conditions/risks identified: See problem list  Next appointment: Follow up in one year for your annual wellness visit 01/19/2021 @ 3:00   Preventive Care 65 Years and Older, Female Preventive care refers to lifestyle choices and visits with your health care provider that can promote health and wellness. What does preventive care include?  A yearly physical exam. This is also called an annual well check.  Dental exams once or twice a year.  Routine eye exams. Ask your health care provider how often you should have your eyes checked.  Personal lifestyle choices, including:  Daily care of your teeth and gums.  Regular physical activity.  Eating a healthy diet.  Avoiding tobacco and drug use.  Limiting alcohol use.  Practicing safe sex.  Taking low-dose aspirin every day.  Taking vitamin and mineral supplements as recommended by your health care provider. What happens during an annual well check? The services and screenings done by your health care provider during your annual well check will depend on your age, overall health, lifestyle risk factors, and family history of  disease. Counseling  Your health care provider may ask you questions about your:  Alcohol use.  Tobacco use.  Drug use.  Emotional well-being.  Home and relationship well-being.  Sexual activity.  Eating habits.  History of falls.  Memory and ability to understand (cognition).  Work and work Astronomer.  Reproductive health. Screening  You may have the following tests or measurements:  Height, weight, and BMI.  Blood pressure.  Lipid and cholesterol levels. These may be checked every 5 years, or more frequently if you are over 33 years old.  Skin check.  Lung cancer screening. You may have this screening every year starting at age 37 if you have a 30-pack-year history of smoking and currently smoke or have quit within the past 15 years.  Fecal occult blood test (FOBT) of the stool. You may have this test every year starting at age 33.  Flexible sigmoidoscopy or colonoscopy. You may have a sigmoidoscopy every 5 years or a colonoscopy every 10 years starting at age 44.  Hepatitis C blood test.  Hepatitis B blood test.  Sexually transmitted disease (STD) testing.  Diabetes screening. This is done by checking your blood sugar (glucose) after you have not eaten for a while (fasting). You may have this done every 1-3 years.  Bone density scan. This is done to screen for osteoporosis. You may have this done starting at age 68.  Mammogram. This may be done every 1-2 years. Talk to your health care provider about how often you should have regular mammograms. Talk with your health care provider about your test results, treatment options, and if necessary, the need for more tests. Vaccines  Your health care provider may recommend certain vaccines, such as:  Influenza vaccine. This is recommended  every year.  Tetanus, diphtheria, and acellular pertussis (Tdap, Td) vaccine. You may need a Td booster every 10 years.  Zoster vaccine. You may need this after age  52.  Pneumococcal 13-valent conjugate (PCV13) vaccine. One dose is recommended after age 75.  Pneumococcal polysaccharide (PPSV23) vaccine. One dose is recommended after age 53. Talk to your health care provider about which screenings and vaccines you need and how often you need them. This information is not intended to replace advice given to you by your health care provider. Make sure you discuss any questions you have with your health care provider. Document Released: 03/07/2015 Document Revised: 10/29/2015 Document Reviewed: 12/10/2014 Elsevier Interactive Patient Education  2017 [employer] Prevention in the Home Falls can cause injuries. They can happen to people of all ages. There are many things you can do to make your home safe and to help prevent falls. What can I do on the outside of my home?  Regularly fix the edges of walkways and driveways and fix any cracks.  Remove anything that might make you trip as you walk through a door, such as a raised step or threshold.  Trim any bushes or trees on the path to your home.  Use bright outdoor lighting.  Clear any walking paths of anything that might make someone trip, such as rocks or tools.  Regularly check to see if handrails are loose or broken. Make sure that both sides of any steps have handrails.  Any raised decks and porches should have guardrails on the edges.  Have any leaves, snow, or ice cleared regularly.  Use sand or salt on walking paths during winter.  Clean up any spills in your garage right away. This includes oil or grease spills. What can I do in the bathroom?  Use night lights.  Install grab bars by the toilet and in the tub and shower. Do not use towel bars as grab bars.  Use non-skid mats or decals in the tub or shower.  If you need to sit down in the shower, use a plastic, non-slip stool.  Keep the floor dry. Clean up any water that spills on the floor as soon as it happens.  Remove  soap buildup in the tub or shower regularly.  Attach bath mats securely with double-sided non-slip rug tape.  Do not have throw rugs and other things on the floor that can make you trip. What can I do in the bedroom?  Use night lights.  Make sure that you have a light by your bed that is easy to reach.  Do not use any sheets or blankets that are too big for your bed. They should not hang down onto the floor.  Have a firm chair that has side arms. You can use this for support while you get dressed.  Do not have throw rugs and other things on the floor that can make you trip. What can I do in the kitchen?  Clean up any spills right away.  Avoid walking on wet floors.  Keep items that you use a lot in easy-to-reach places.  If you need to reach something above you, use a strong step stool that has a grab bar.  Keep electrical cords out of the way.  Do not use floor polish or wax that makes floors slippery. If you must use wax, use non-skid floor wax.  Do not have throw rugs and other things on the floor that can make you trip. What  can I do with my stairs?  Do not leave any items on the stairs.  Make sure that there are handrails on both sides of the stairs and use them. Fix handrails that are broken or loose. Make sure that handrails are as long as the stairways.  Check any carpeting to make sure that it is firmly attached to the stairs. Fix any carpet that is loose or worn.  Avoid having throw rugs at the top or bottom of the stairs. If you do have throw rugs, attach them to the floor with carpet tape.  Make sure that you have a light switch at the top of the stairs and the bottom of the stairs. If you do not have them, ask someone to add them for you. What else can I do to help prevent falls?  Wear shoes that:  Do not have high heels.  Have rubber bottoms.  Are comfortable and fit you well.  Are closed at the toe. Do not wear sandals.  If you use a  stepladder:  Make sure that it is fully opened. Do not climb a closed stepladder.  Make sure that both sides of the stepladder are locked into place.  Ask someone to hold it for you, if possible.  Clearly mark and make sure that you can see:  Any grab bars or handrails.  First and last steps.  Where the edge of each step is.  Use tools that help you move around (mobility aids) if they are needed. These include:  Canes.  Walkers.  Scooters.  Crutches.  Turn on the lights when you go into a dark area. Replace any light bulbs as soon as they burn out.  Set up your furniture so you have a clear path. Avoid moving your furniture around.  If any of your floors are uneven, fix them.  If there are any pets around you, be aware of where they are.  Review your medicines with your doctor. Some medicines can make you feel dizzy. This can increase your chance of falling. Ask your doctor what other things that you can do to help prevent falls. This information is not intended to replace advice given to you by your health care provider. Make sure you discuss any questions you have with your health care provider. Document Released: 12/05/2008 Document Revised: 07/17/2015 Document Reviewed: 03/15/2014 Elsevier Interactive Patient Education  2017 [employer].

## 2020-01-31 ENCOUNTER — Telehealth: Payer: Self-pay

## 2020-01-31 NOTE — Telephone Encounter (Signed)
Error

## 2020-01-31 NOTE — Telephone Encounter (Signed)
In reviewing patient chart. PCP did recommend patient coming in for evaluation for her dizziness a month ago. Per her husband that had improved. If patient is currently having sinus symptoms then this will need to be an video visit.

## 2020-01-31 NOTE — Telephone Encounter (Signed)
Patient has called in to schedule appt.   Patient states she is also having sinus drainage and has had this for 6 months now.     States she was told to schedule an in office visit.  I have scheduled patient a mychart visit for 11am tomorrow.    Please advise if this is to be changed to in office.

## 2020-02-01 ENCOUNTER — Telehealth (INDEPENDENT_AMBULATORY_CARE_PROVIDER_SITE_OTHER): Payer: Medicare Other | Admitting: Physician Assistant

## 2020-02-01 ENCOUNTER — Encounter: Payer: Self-pay | Admitting: Physician Assistant

## 2020-02-01 DIAGNOSIS — H8103 Meniere's disease, bilateral: Secondary | ICD-10-CM

## 2020-02-01 DIAGNOSIS — J329 Chronic sinusitis, unspecified: Secondary | ICD-10-CM

## 2020-02-01 DIAGNOSIS — J310 Chronic rhinitis: Secondary | ICD-10-CM

## 2020-02-01 MED ORDER — PREDNISONE 20 MG PO TABS: 40.0000 mg | ORAL_TABLET | Freq: Every day | ORAL | 0 refills | Status: DC

## 2020-02-01 MED ORDER — MONTELUKAST SODIUM 10 MG PO TABS: 10.0000 mg | ORAL_TABLET | Freq: Every day | ORAL | 3 refills | Status: DC

## 2020-02-01 MED ORDER — CEFDINIR 300 MG PO CAPS: 300.0000 mg | ORAL_CAPSULE | Freq: Two times a day (BID) | ORAL | 0 refills | Status: DC

## 2020-02-01 NOTE — Progress Notes (Signed)
Virtual Visit via Video   I connected with patient on 02/01/20 at 11:00 AM EST by a video enabled telemedicine application and verified that I am speaking with the correct person using two identifiers.  Location patient: Home Location provider: [facility] Persons participating in the virtual visit: Patient, Provider, CMA (Patina Moore)  I discussed the limitations of evaluation and management by telemedicine and the availability of in person appointments. The patient expressed understanding and agreed to proceed.  Subjective:   HPI:   Patient presents via [facility] today c/o ongoing issue with post-nasal drainage, dry cough, nasal congestion with intermittent sinus pressure/pain and tooth pain. This has been ongoing over the past 4 months. Is taking OTC flonase, and tried multiple different antihistamines with only slight improvement in symptoms. Denies fever, chills, malaise. Denies chest pain or chest congestion. Denies SOB.    ROS:   See pertinent positives and negatives per HPI.  Patient Active Problem List   Diagnosis Date Noted  . Paroxysmal atrial fibrillation (HCC) 05/10/2017  . Hypothyroidism 02/24/2017  . 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 S/P cardiac catheterization, 08/20/16 minimal CAD  08/21/2016  . Unstable angina (HCC)   . DDD (degenerative disc disease), lumbar 06/29/2016  . Meniere's disease 06/29/2016  . Diabetes mellitus, type 2 (HCC) 09/30/2014  . Hyperlipemia 09/30/2014  . Hypertension 09/30/2014  . Hx of adenomatous colonic polyps 05/08/2003    Social History   Tobacco Use  . Smoking status: Never Smoker  . Smokeless tobacco: Never Used  Substance Use Topics  . Alcohol use: No    Current Outpatient Medications:  .  amLODipine (NORVASC) 10 MG tablet, TAKE 1 TABLET DAILY, Disp: 90 tablet, Rfl: 1 .  DULoxetine (CYMBALTA) 30 MG capsule, TAKE 1 CAPSULE DAILY, Disp: 90 capsule, Rfl: 1 .  fluticasone (FLONASE) 50 MCG/ACT nasal spray,  Place 1 spray into both nostrils daily., Disp: 16 g, Rfl: 0 .  glucose blood (CONTOUR NEXT TEST) test strip, Check blood sugars once daily, Disp: 100 each, Rfl: 6 .  hydrALAZINE (APRESOLINE) 50 MG tablet, Take 1 tablet (50 mg total) by mouth 2 (two) times daily., Disp: 180 tablet, Rfl: 1 .  hydrochlorothiazide (HYDRODIURIL) 25 MG tablet, Take 1 tablet (25 mg total) by mouth daily. Please keep upcoming appt in January 2022 with Dr. Johney Frame before anymore refills. Thank you, Disp: 90 tablet, Rfl: 0 .  irbesartan (AVAPRO) 150 MG tablet, Take 1 tablet (150 mg total) by mouth daily. Please keep upcoming appt in January 2022 with Dr. Johney Frame for future refills. Thank you, Disp: 90 tablet, Rfl: 0 .  KLOR-CON M20 20 MEQ tablet, TAKE 1 TABLET TWICE A DAY, Disp: 180 tablet, Rfl: 1 .  LANCETS ULTRA THIN 30G MISC, 1 Stick by Does not apply route daily., Disp: 100 each, Rfl: 6 .  levothyroxine (SYNTHROID) 88 MCG tablet, Take 1 tablet (88 mcg total) by mouth daily., Disp: 90 tablet, Rfl: 2 .  lovastatin (MEVACOR) 20 MG tablet, TAKE 1 TABLET BY MOUTH EVERYDAY AT BEDTIME, Disp: 90 tablet, Rfl: 1 .  Magnesium 200 MG TABS, Take 200 mg by mouth daily. , Disp: , Rfl:  .  metFORMIN (GLUCOPHAGE) 500 MG tablet, TAKE 1 TABLET TWICE DAILY  WITH MEALS, Disp: 180 tablet, Rfl: 3 .  Omega-3 Fatty Acids (FISH OIL) 1000 MG CAPS, Take 1,000 mg by mouth daily. , Disp: , Rfl:  .  pantoprazole (PROTONIX) 40 MG tablet, TAKE 1 TABLET BY MOUTH EVERY DAY, Disp: 30 tablet, Rfl: 0 .  vitamin B-12 (CYANOCOBALAMIN) 1000 MCG tablet, Take 1 tablet (1,000 mcg total) by mouth daily., Disp: 30 tablet, Rfl: 0 .  aspirin EC 81 MG tablet, Take 1 tablet (81 mg total) by mouth daily., Disp: 30 tablet, Rfl: 0 .  azelastine (ASTELIN) 0.1 % nasal spray, Place 1 spray into both nostrils 2 (two) times daily. Use in each nostril as directed, Disp: 30 mL, Rfl: 12 .  cefdinir (OMNICEF) 300 MG capsule, Take 1 capsule (300 mg total) by mouth 2 (two) times daily.,  Disp: 20 capsule, Rfl: 0 .  Cholecalciferol (VITAMIN D) 2000 units CAPS, Take 1 capsule by mouth daily. , Disp: , Rfl:  .  doxylamine, Sleep, (SLEEP AID) 25 MG tablet, Take 25 mg by mouth at bedtime., Disp: , Rfl:  .  montelukast (SINGULAIR) 10 MG tablet, Take 1 tablet (10 mg total) by mouth at bedtime., Disp: 30 tablet, Rfl: 3 .  predniSONE (DELTASONE) 20 MG tablet, Take 2 tablets (40 mg total) by mouth daily with breakfast., Disp: 6 tablet, Rfl: 0  Allergies  Allergen Reactions  . Demeclocycline Rash  . Doxycycline Rash  . Erythromycin Rash  . Lisinopril Cough  . Tetracyclines & Related Rash    Objective:   There were no vitals taken for this visit.  Patient is well-developed, well-nourished in no acute distress.  Resting comfortably at home.  Head is normocephalic, atraumatic.  No labored breathing.  Speech is clear and coherent with logical content.  Patient is alert and oriented at baseline.   Assessment and Plan:   1. Chronic sinusitis, unspecified location - predniSONE (DELTASONE) 20 MG tablet; Take 2 tablets (40 mg total) by mouth daily with breakfast.  Dispense: 6 tablet; Refill: 0 - cefdinir (OMNICEF) 300 MG capsule; Take 1 capsule (300 mg total) by mouth 2 (two) times daily.  Dispense: 20 capsule; Refill: 0 - Ambulatory referral to ENT  2. Chronic rhinitis - montelukast (SINGULAIR) 10 MG tablet; Take 1 tablet (10 mg total) by mouth at bedtime.  Dispense: 30 tablet; Refill: 3 - Ambulatory referral to ENT  3. Meniere's disease of both ears - Ambulatory referral to ENT  Given ongoing symptoms despite conservative measures and OTC medications we will plan referral to ENT.  Will add on Singulair and have her continue Flonase and OTC antihistamine.  Continue saline nasal rinse.  We will add on antibiotic and short burst of steroid for chronic sinus inflammation/concern for festering sinusitis.  She is to follow-up Monday via MyChart to let us know how things are going so  we can make further adjustments while specialist appointment is pending.  Piedad Climes, PA-C 02/01/2020

## 2020-02-01 NOTE — Progress Notes (Signed)
I connected with  Ashley Woodard on 02/01/20 by a video enabled telemedicine application and verified that I am speaking with the correct person using two identifiers.   I discussed the limitations of evaluation and management by telemedicine. The patient expressed understanding and agreed to proceed.

## 2020-02-06 ENCOUNTER — Encounter: Payer: Self-pay | Admitting: Physician Assistant

## 2020-02-07 NOTE — Telephone Encounter (Signed)
Ashley Woodard can you please check on this?  Thank you.

## 2020-02-11 ENCOUNTER — Ambulatory Visit (INDEPENDENT_AMBULATORY_CARE_PROVIDER_SITE_OTHER): Payer: Medicare Other

## 2020-02-11 DIAGNOSIS — I480 Paroxysmal atrial fibrillation: Secondary | ICD-10-CM | POA: Diagnosis not present

## 2020-02-11 LAB — CUP PACEART REMOTE DEVICE CHECK
Date Time Interrogation Session: 20211218231444
Implantable Pulse Generator Implant Date: 20191008

## 2020-02-12 ENCOUNTER — Other Ambulatory Visit: Payer: Self-pay | Admitting: Internal Medicine

## 2020-02-12 ENCOUNTER — Other Ambulatory Visit: Payer: Self-pay | Admitting: Physician Assistant

## 2020-02-12 ENCOUNTER — Encounter: Payer: Self-pay | Admitting: Physician Assistant

## 2020-02-12 MED ORDER — DULOXETINE HCL 30 MG PO CPEP: ORAL_CAPSULE | ORAL | 0 refills | Status: DC

## 2020-02-21 ENCOUNTER — Ambulatory Visit
Admission: EM | Admit: 2020-02-21 | Discharge: 2020-02-21 | Disposition: A | Discharge status: Home / Self Care | Payer: Medicare Other | Attending: Emergency Medicine | Admitting: Emergency Medicine

## 2020-02-21 ENCOUNTER — Encounter: Payer: Self-pay | Admitting: Emergency Medicine

## 2020-02-21 ENCOUNTER — Other Ambulatory Visit: Payer: Self-pay

## 2020-02-21 DIAGNOSIS — J069 Acute upper respiratory infection, unspecified: Secondary | ICD-10-CM | POA: Diagnosis not present

## 2020-02-21 DIAGNOSIS — Z20822 Contact with and (suspected) exposure to covid-19: Secondary | ICD-10-CM | POA: Diagnosis not present

## 2020-02-21 MED ORDER — BENZONATATE 200 MG PO CAPS: 200.0000 mg | ORAL_CAPSULE | Freq: Three times a day (TID) | ORAL | 0 refills | Status: AC | PRN

## 2020-02-21 MED ORDER — DM-GUAIFENESIN ER 30-600 MG PO TB12: 1.0000 | ORAL_TABLET | Freq: Two times a day (BID) | ORAL | 0 refills | Status: DC

## 2020-02-21 MED ORDER — GUAIFENESIN-CODEINE 100-10 MG/5ML PO SOLN: 5.0000 mL | Freq: Every evening | ORAL | 0 refills | Status: DC | PRN

## 2020-02-21 NOTE — ED Triage Notes (Signed)
Pt here for cough and URI sx x 6 days

## 2020-02-21 NOTE — Discharge Instructions (Signed)
Covid test pending, monitor my chart for results Tylenol and ibuprofen as needed for fevers headaches body aches Continue Flonase Mucinex DM twice daily for congestion and cough during the day Tessalon for cough every 8 hours as needed during the day May use Robitussin with codeine at bedtime Follow-up if not improving or worsening

## 2020-02-21 NOTE — ED Provider Notes (Signed)
[facility]    CSN: [id number] Arrival date & time: 02/21/20  1131      History   Chief Complaint Chief Complaint  Patient presents with  . Cough    HPI Ashley Woodard is a 69 y.o. female presenting today for evaluation of URI symptoms. Has had cough congestion shortness of breath. Husband with some mild symptoms. Using nyquil at night. Denies fevers. Mild diarrhea and nausea.  Denies any known exposures.  HPI  Past Medical History:  Diagnosis Date  . Abnormal cardiovascular stress test 08/21/2016   a. done for abnl EKG/cardiac risk factors -> admitted for cath 07/2016 which showed no significant CAD, normal LV contraction, mildly elevated filling pressure. Low dose Imdur was added for possible component of microvascular dysfunction.   . Allergy   . Anxiety   . Arthritis    "back, knees, fingers" (05/10/2017)  . Chronic lower back pain   . Colon polyps   . Deafness in right ear   . Depression   . Diverticulosis   . Hx of adenomatous colonic polyps 05/08/2003  . Hypertension   . Hypertriglyceridemia   . Hypothyroidism   . Migraine    "none in the 2000s" (05/10/2017)  . Osteoporosis   . Paroxysmal atrial fibrillation (HCC)   . Pneumonia    "several times" (05/10/2017)  . PONV (postoperative nausea and vomiting)   . S/P cardiac catheterization, 08/20/16 minimal CAD 08/21/2016  . Type II diabetes mellitus (HCC)   . Voice tremor     Patient Active Problem List   Diagnosis Date Noted  . Paroxysmal atrial fibrillation (HCC) 05/10/2017  . Hypothyroidism 02/24/2017  . 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 S/P cardiac catheterization, 08/20/16 minimal CAD  08/21/2016  . Unstable angina (HCC)   . DDD (degenerative disc disease), lumbar 06/29/2016  . Meniere's disease 06/29/2016  . Diabetes mellitus, type 2 (HCC) 09/30/2014  . Hyperlipemia 09/30/2014  . Hypertension 09/30/2014  . Hx of adenomatous colonic polyps 05/08/2003    Past Surgical History:  Procedure  Laterality Date  . ABDOMINAL HYSTERECTOMY  1988  . ATRIAL FIBRILLATION ABLATION N/A 05/10/2017   Procedure: ATRIAL FIBRILLATION ABLATION;  Surgeon: Hillis RangeAllred, James, MD;  Location: [facility];  Service: Cardiovascular;  Laterality: N/A;  . COCHLEAR IMPLANT Right 2008  . COLONOSCOPY    . LEFT HEART CATH AND CORONARY ANGIOGRAPHY N/A 08/20/2016   Procedure: Left Heart Cath and Coronary Angiography;  Surgeon: Yvonne KendallEnd, Christopher, MD;  Location: [facility];  Service: Cardiovascular;  Laterality: N/A;  . LOOP RECORDER INSERTION N/A 11/29/2017   Procedure: LOOP RECORDER INSERTION;  Surgeon: Hillis RangeAllred, James, MD;  Location: [facility];  Service: Cardiovascular;  Laterality: N/A;  . ORIF ANKLE FRACTURE Right 02/06/2019   Procedure: OPEN REDUCTION INTERNAL FIXATION (ORIF) ANKLE FRACTURE LATERAL MALLEOLUS, SYNDESMOSIS, RIGHT;  Surgeon: Terance HartAdair, Christopher R, MD;  Location: [facility];  Service: Orthopedics;  Laterality: Right;  SURGERY REQUEST TIME: 1.5 HOURS  CPT CODES: 1914727792, 27829, [id number], [id number]    OB History   No obstetric history on file.      Home Medications    Prior to Admission medications   Medication Sig Start Date End Date Taking? Authorizing Provider  benzonatate (TESSALON) 200 MG capsule Take 1 capsule (200 mg total) by mouth 3 (three) times daily as needed for up to 7 days for cough. 02/21/20 02/28/20 Yes Bode Pieper C, PA-C  dextromethorphan-guaiFENesin (MUCINEX DM) 30-600 MG 12hr tablet Take 1 tablet by  mouth 2 (two) times daily. 02/21/20  Yes Addilyne Backs C, PA-C  guaiFENesin-codeine 100-10 MG/5ML syrup Take 5-10 mLs by mouth at bedtime as needed for cough. 02/21/20  Yes Kleo Dungee C, PA-C  pantoprazole (PROTONIX) 40 MG tablet TAKE 1 TABLET BY MOUTH EVERY DAY 07/27/19   Everrett Coombe, DO  amLODipine (NORVASC) 10 MG tablet TAKE 1 TABLET DAILY 09/27/19   Waldon Merl, PA-C  aspirin EC 81 MG tablet Take 1 tablet (81 mg total) by mouth daily.  01/26/19   Dartha Lodge, PA-C  azelastine (ASTELIN) 0.1 % nasal spray Place 1 spray into both nostrils 2 (two) times daily. Use in each nostril as directed 07/25/19   Waldon Merl, PA-C  cefdinir (OMNICEF) 300 MG capsule Take 1 capsule (300 mg total) by mouth 2 (two) times daily. Patient not taking: Reported on 02/21/2020 02/01/20   Waldon Merl, PA-C  Cholecalciferol (VITAMIN D) 2000 units CAPS Take 1 capsule by mouth daily.     [provider]  doxylamine, Sleep, (SLEEP AID) 25 MG tablet Take 25 mg by mouth at bedtime.    [provider]  DULoxetine (CYMBALTA) 30 MG capsule Take 1 capsule PO QOD x 1 week. Then take 1 capsule every 3 days for 1 week before stopping. 02/12/20   Waldon Merl, PA-C  fluticasone (FLONASE) 50 MCG/ACT nasal spray Place 1 spray into both nostrils daily. 10/21/19   Hall-Potvin, [place], PA-C  glucose blood (CONTOUR NEXT TEST) test strip Check blood sugars once daily 06/15/17   Waldon Merl, PA-C  hydrALAZINE (APRESOLINE) 50 MG tablet Take 1 tablet (50 mg total) by mouth 2 (two) times daily. 09/05/19   Waldon Merl, PA-C  hydrochlorothiazide (HYDRODIURIL) 25 MG tablet Take 1 tablet (25 mg total) by mouth daily. Please keep upcoming appt in January 2022 with Dr. Johney Frame before anymore refills. Thank you 12/25/19   Hillis Range, MD  irbesartan (AVAPRO) 150 MG tablet Take 1 tablet (150 mg total) by mouth daily. Please keep upcoming appt in January 2022 with Dr. Johney Frame for future refills. Thank you 12/25/19   Hillis Range, MD  LANCETS ULTRA THIN 30G MISC 1 Stick by Does not apply route daily. 06/15/17   Waldon Merl, PA-C  levothyroxine (SYNTHROID) 88 MCG tablet Take 1 tablet (88 mcg total) by mouth daily. 12/13/19   Waldon Merl, PA-C  lovastatin (MEVACOR) 20 MG tablet TAKE 1 TABLET BY MOUTH EVERYDAY AT BEDTIME 07/09/19   Waldon Merl, PA-C  Magnesium 200 MG TABS Take 200 mg by mouth daily.     [provider]   metFORMIN (GLUCOPHAGE) 500 MG tablet TAKE 1 TABLET TWICE DAILY  WITH MEALS 09/03/19   Waldon Merl, PA-C  montelukast (SINGULAIR) 10 MG tablet Take 1 tablet (10 mg total) by mouth at bedtime. 02/01/20   Waldon Merl, PA-C  Omega-3 Fatty Acids (FISH OIL) 1000 MG CAPS Take 1,000 mg by mouth daily.     [provider]  potassium chloride SA (KLOR-CON M20) 20 MEQ tablet Take 1 tablet (20 mEq total) by mouth 2 (two) times daily. 02/12/20   Allred, Fayrene Fearing, MD  predniSONE (DELTASONE) 20 MG tablet Take 2 tablets (40 mg total) by mouth daily with breakfast. 02/01/20   Waldon Merl, PA-C  vitamin B-12 (CYANOCOBALAMIN) 1000 MCG tablet Take 1 tablet (1,000 mcg total) by mouth daily. 11/08/16   Waldon Merl, PA-C    Family History Family History  Problem Relation Age  of Onset  . Hypertension Mother   . Diabetes Mother   . Heart attack Mother   . Colon polyps Mother   . Hypertension Father   . Heart attack Father   . Stroke Father   . Breast cancer Sister 57  . Hypertension Brother   . Cancer Brother        Prostate  . Diabetes Brother   . Dementia Brother   . Stroke Brother   . Heart attack Brother   . Prostate cancer Brother   . Hypertension Son   . Breast cancer Maternal Aunt        in 78's  . Cancer Maternal Uncle        Lung  . Colon cancer Neg Hx   . Esophageal cancer Neg Hx   . Rectal cancer Neg Hx   . Stomach cancer Neg Hx     Social History Social History   Tobacco Use  . Smoking status: Never Smoker  . Smokeless tobacco: Never Used  Vaping Use  . Vaping Use: Never used  Substance Use Topics  . Alcohol use: No  . Drug use: No     Allergies   Demeclocycline, Doxycycline, Erythromycin, Lisinopril, and Tetracyclines & related   Review of Systems Review of Systems  Constitutional: Negative for activity change, appetite change, chills, fatigue and fever.  HENT: Positive for congestion, rhinorrhea, sinus pressure and sore throat. Negative  for ear pain and trouble swallowing.   Eyes: Negative for discharge and redness.  Respiratory: Positive for cough. Negative for chest tightness and shortness of breath.   Cardiovascular: Negative for chest pain.  Gastrointestinal: Negative for abdominal pain, diarrhea, nausea and vomiting.  Musculoskeletal: Negative for myalgias.  Skin: Negative for rash.  Neurological: Negative for dizziness, light-headedness and headaches.     Physical Exam Triage Vital Signs ED Triage Vitals  Enc Vitals Group     BP      Pulse      Resp      Temp      Temp src      SpO2      Weight      Height      Head Circumference      Peak Flow      Pain Score      Pain Loc      Pain Edu?      Excl. in GC?    No data found.  Updated Vital Signs BP (!) 150/82 (BP Location: Left Arm)   Pulse 70   Temp 97.9 F (36.6 C) (Oral)   Resp 18   SpO2 97%   Visual Acuity Right Eye Distance:   Left Eye Distance:   Bilateral Distance:    Right Eye Near:   Left Eye Near:    Bilateral Near:     Physical Exam Vitals and nursing note reviewed.  Constitutional:      Appearance: She is well-developed and well-nourished.     Comments: No acute distress  HENT:     Head: Normocephalic and atraumatic.     Ears:     Comments: Bilateral ears without tenderness to palpation of external auricle, tragus and mastoid, EAC's without erythema or swelling, TM's with good bony landmarks and cone of light. Non erythematous.     Nose: Nose normal.     Mouth/Throat:     Comments: Oral mucosa pink and moist, no tonsillar enlargement or exudate. Posterior pharynx patent and nonerythematous, no uvula deviation or swelling.  Normal phonation. Eyes:     Conjunctiva/sclera: Conjunctivae normal.  Cardiovascular:     Rate and Rhythm: Normal rate.  Pulmonary:     Effort: Pulmonary effort is normal. No respiratory distress.     Comments: Breathing comfortably at rest, CTABL, no wheezing, rales or other adventitious sounds  auscultated Abdominal:     General: There is no distension.  Musculoskeletal:        General: Normal range of motion.     Cervical back: Neck supple.  Skin:    General: Skin is warm and dry.  Neurological:     Mental Status: She is alert and oriented to person, place, and time.  Psychiatric:        Mood and Affect: Mood and affect normal.      UC Treatments / Results  Labs (all labs ordered are listed, but only abnormal results are displayed) Labs Reviewed  NOVEL CORONAVIRUS, NAA    EKG   Radiology No results found.  Procedures Procedures (including critical care time)  Medications Ordered in UC Medications - No data to display  Initial Impression / Assessment and Plan / UC Course  I have reviewed the triage vital signs and the nursing notes.  Pertinent labs & imaging results that were available during my care of the patient were reviewed by me and considered in my medical decision making (see chart for details).     URI symptoms x5 days.  Exam reassuring, vital signs stable, recommending symptomatic and supportive care rest and fluids.  Covid test pending.  Discussed strict return precautions. Patient verbalized understanding and is agreeable with plan.  Final Clinical Impressions(s) / UC Diagnoses   Final diagnoses:  Encounter for screening laboratory testing for COVID-19 virus  Viral URI with cough     Discharge Instructions     Covid test pending, monitor my chart for results Tylenol and ibuprofen as needed for fevers headaches body aches Continue Flonase Mucinex DM twice daily for congestion and cough during the day Tessalon for cough every 8 hours as needed during the day May use Robitussin with codeine at bedtime Follow-up if not improving or worsening    ED Prescriptions    Medication Sig Dispense Auth. Provider   benzonatate (TESSALON) 200 MG capsule Take 1 capsule (200 mg total) by mouth 3 (three) times daily as needed for up to 7 days for  cough. 28 capsule Tyashia Morrisette C, PA-C   dextromethorphan-guaiFENesin (MUCINEX DM) 30-600 MG 12hr tablet Take 1 tablet by mouth 2 (two) times daily. 20 tablet Lashena Signer C, PA-C   guaiFENesin-codeine 100-10 MG/5ML syrup Take 5-10 mLs by mouth at bedtime as needed for cough. 120 mL Alexxa Sabet, Frederica C, PA-C     I have reviewed the PDMP during this encounter.   Lew Dawes, PA-C 02/21/20 1525

## 2020-02-24 LAB — SARS-COV-2, NAA 2 DAY TAT

## 2020-02-24 LAB — NOVEL CORONAVIRUS, NAA: SARS-CoV-2, NAA: NOT DETECTED

## 2020-02-25 ENCOUNTER — Other Ambulatory Visit: Payer: Self-pay

## 2020-02-25 ENCOUNTER — Encounter (INDEPENDENT_AMBULATORY_CARE_PROVIDER_SITE_OTHER): Payer: Self-pay | Admitting: Otolaryngology

## 2020-02-25 ENCOUNTER — Encounter: Payer: Medicare Other | Admitting: Internal Medicine

## 2020-02-25 ENCOUNTER — Ambulatory Visit (INDEPENDENT_AMBULATORY_CARE_PROVIDER_SITE_OTHER): Payer: Medicare Other | Admitting: Otolaryngology

## 2020-02-25 VITALS — Temp 97.3°F

## 2020-02-25 DIAGNOSIS — J310 Chronic rhinitis: Secondary | ICD-10-CM | POA: Diagnosis not present

## 2020-02-25 DIAGNOSIS — H8101 Meniere's disease, right ear: Secondary | ICD-10-CM | POA: Diagnosis not present

## 2020-02-25 NOTE — Progress Notes (Signed)
HPI: Ashley Woodard is a 70 y.o. female who presents is referred by her PCP for evaluation of sinus complaints.  She has previously been followed by Dr. Dorma Russell who performed a right ear cochlear implant several years ago.  She has a history of Mnire's disease.  She also has history of chronic rhinitis and sinus issues and is presently taking Flonase on a regular basis she complains of postnasal drainage.. She has also been prescribed Singulair but does not feel like this helps much. She has also used a Nettie pot in the past She is referred here to get a new ENT since Dr. Dorma Russell has left [place].  Past Medical History:  Diagnosis Date  . Abnormal cardiovascular stress test 08/21/2016   a. done for abnl EKG/cardiac risk factors -> admitted for cath 07/2016 which showed no significant CAD, normal LV contraction, mildly elevated filling pressure. Low dose Imdur was added for possible component of microvascular dysfunction.   . Allergy   . Anxiety   . Arthritis    "back, knees, fingers" (05/10/2017)  . Chronic lower back pain   . Colon polyps   . Deafness in right ear   . Depression   . Diverticulosis   . Hx of adenomatous colonic polyps 05/08/2003  . Hypertension   . Hypertriglyceridemia   . Hypothyroidism   . Migraine    "none in the 2000s" (05/10/2017)  . Osteoporosis   . Paroxysmal atrial fibrillation (HCC)   . Pneumonia    "several times" (05/10/2017)  . PONV (postoperative nausea and vomiting)   . S/P cardiac catheterization, 08/20/16 minimal CAD 08/21/2016  . Type II diabetes mellitus (HCC)   . Voice tremor    Past Surgical History:  Procedure Laterality Date  . ABDOMINAL HYSTERECTOMY  1988  . ATRIAL FIBRILLATION ABLATION N/A 05/10/2017   Procedure: ATRIAL FIBRILLATION ABLATION;  Surgeon: Hillis Range, MD;  Location: [facility];  Service: Cardiovascular;  Laterality: N/A;  . COCHLEAR IMPLANT Right 2008  . COLONOSCOPY    . LEFT HEART CATH AND CORONARY ANGIOGRAPHY N/A  08/20/2016   Procedure: Left Heart Cath and Coronary Angiography;  Surgeon: Yvonne Kendall, MD;  Location: [facility];  Service: Cardiovascular;  Laterality: N/A;  . LOOP RECORDER INSERTION N/A 11/29/2017   Procedure: LOOP RECORDER INSERTION;  Surgeon: Hillis Range, MD;  Location: [facility];  Service: Cardiovascular;  Laterality: N/A;  . ORIF ANKLE FRACTURE Right 02/06/2019   Procedure: OPEN REDUCTION INTERNAL FIXATION (ORIF) ANKLE FRACTURE LATERAL MALLEOLUS, SYNDESMOSIS, RIGHT;  Surgeon: Terance Hart, MD;  Location: [facility];  Service: Orthopedics;  Laterality: Right;  SURGERY REQUEST TIME: 1.5 HOURS  CPT CODES: 03704, 27829, 88891, 27610   Social History   Socioeconomic History  . Marital status: Married    Spouse name: Not on file  . Number of children: 2  . Years of education: 13  . Highest education level: Not on file  Occupational History  . Occupation: Art therapist  Tobacco Use  . Smoking status: Never Smoker  . Smokeless tobacco: Never Used  Vaping Use  . Vaping Use: Never used  Substance and Sexual Activity  . Alcohol use: No  . Drug use: No  . Sexual activity: Not Currently  Other Topics Concern  . Not on file  Social History Narrative   Lives at home with husband in [place].   Right-handed.   No caffeine use.   Manages a call center   Social Determinants of  Health   Financial Resource Strain: Low Risk   . Difficulty of Paying Living Expenses: Not hard at all  Food Insecurity: No Food Insecurity  . Worried About Programme researcher, broadcasting/film/video in the Last Year: Never true  . Ran Out of Food in the Last Year: Never true  Transportation Needs: No Transportation Needs  . Lack of Transportation (Medical): No  . Lack of Transportation (Non-Medical): No  Physical Activity: Inactive  . Days of Exercise per Week: 0 days  . Minutes of Exercise per Session: 0 min  Stress: No Stress Concern Present  . Feeling of Stress : Not at all   Social Connections: Moderately Isolated  . Frequency of Communication with Friends and Family: More than three times a week  . Frequency of Social Gatherings with Friends and Family: More than three times a week  . Attends Religious Services: Never  . Active Member of Clubs or Organizations: No  . Attends Banker Meetings: Never  . Marital Status: Married   Family History  Problem Relation Age of Onset  . Hypertension Mother   . Diabetes Mother   . Heart attack Mother   . Colon polyps Mother   . Hypertension Father   . Heart attack Father   . Stroke Father   . Breast cancer Sister 5  . Hypertension Brother   . Cancer Brother        Prostate  . Diabetes Brother   . Dementia Brother   . Stroke Brother   . Heart attack Brother   . Prostate cancer Brother   . Hypertension Son   . Breast cancer Maternal Aunt        in 25's  . Cancer Maternal Uncle        Lung  . Colon cancer Neg Hx   . Esophageal cancer Neg Hx   . Rectal cancer Neg Hx   . Stomach cancer Neg Hx    Allergies  Allergen Reactions  . Demeclocycline Rash  . Doxycycline Rash  . Erythromycin Rash  . Lisinopril Cough  . Tetracyclines & Related Rash   Prior to Admission medications   Medication Sig Start Date End Date Taking? Authorizing Provider  pantoprazole (PROTONIX) 40 MG tablet TAKE 1 TABLET BY MOUTH EVERY DAY 07/27/19   Everrett Coombe, DO  amLODipine (NORVASC) 10 MG tablet TAKE 1 TABLET DAILY 09/27/19   Waldon Merl, PA-C  aspirin EC 81 MG tablet Take 1 tablet (81 mg total) by mouth daily. 01/26/19   Dartha Lodge, PA-C  azelastine (ASTELIN) 0.1 % nasal spray Place 1 spray into both nostrils 2 (two) times daily. Use in each nostril as directed 07/25/19   Waldon Merl, PA-C  benzonatate (TESSALON) 200 MG capsule Take 1 capsule (200 mg total) by mouth 3 (three) times daily as needed for up to 7 days for cough. 02/21/20 02/28/20  Wieters, Hallie C, PA-C  cefdinir (OMNICEF) 300 MG capsule Take  1 capsule (300 mg total) by mouth 2 (two) times daily. Patient not taking: Reported on 02/21/2020 02/01/20   Waldon Merl, PA-C  Cholecalciferol (VITAMIN D) 2000 units CAPS Take 1 capsule by mouth daily.     [provider]  dextromethorphan-guaiFENesin (MUCINEX DM) 30-600 MG 12hr tablet Take 1 tablet by mouth 2 (two) times daily. 02/21/20   Wieters, Hallie C, PA-C  doxylamine, Sleep, (SLEEP AID) 25 MG tablet Take 25 mg by mouth at bedtime.    [provider]  DULoxetine (CYMBALTA)  30 MG capsule Take 1 capsule PO QOD x 1 week. Then take 1 capsule every 3 days for 1 week before stopping. 02/12/20   Brunetta Jeans, PA-C  fluticasone (FLONASE) 50 MCG/ACT nasal spray Place 1 spray into both nostrils daily. 10/21/19   Hall-Potvin, [place], PA-C  glucose blood (CONTOUR NEXT TEST) test strip Check blood sugars once daily 06/15/17   Brunetta Jeans, PA-C  guaiFENesin-codeine 100-10 MG/5ML syrup Take 5-10 mLs by mouth at bedtime as needed for cough. 02/21/20   Wieters, Hallie C, PA-C  hydrALAZINE (APRESOLINE) 50 MG tablet Take 1 tablet (50 mg total) by mouth 2 (two) times daily. 09/05/19   Brunetta Jeans, PA-C  hydrochlorothiazide (HYDRODIURIL) 25 MG tablet Take 1 tablet (25 mg total) by mouth daily. Please keep upcoming appt in January 2022 with Dr. Rayann Heman before anymore refills. Thank you 12/25/19   Thompson Grayer, MD  irbesartan (AVAPRO) 150 MG tablet Take 1 tablet (150 mg total) by mouth daily. Please keep upcoming appt in January 2022 with Dr. Rayann Heman for future refills. Thank you 12/25/19   Thompson Grayer, MD  LANCETS ULTRA THIN 30G MISC 1 Stick by Does not apply route daily. 06/15/17   Brunetta Jeans, PA-C  levothyroxine (SYNTHROID) 88 MCG tablet Take 1 tablet (88 mcg total) by mouth daily. 12/13/19   Brunetta Jeans, PA-C  lovastatin (MEVACOR) 20 MG tablet TAKE 1 TABLET BY MOUTH EVERYDAY AT BEDTIME 07/09/19   Brunetta Jeans, PA-C  Magnesium 200 MG TABS Take 200 mg by mouth  daily.     [provider]  metFORMIN (GLUCOPHAGE) 500 MG tablet TAKE 1 TABLET TWICE DAILY  WITH MEALS 09/03/19   Brunetta Jeans, PA-C  montelukast (SINGULAIR) 10 MG tablet Take 1 tablet (10 mg total) by mouth at bedtime. 02/01/20   Brunetta Jeans, PA-C  Omega-3 Fatty Acids (FISH OIL) 1000 MG CAPS Take 1,000 mg by mouth daily.     [provider]  potassium chloride SA (KLOR-CON M20) 20 MEQ tablet Take 1 tablet (20 mEq total) by mouth 2 (two) times daily. 02/12/20   Allred, Jeneen Rinks, MD  predniSONE (DELTASONE) 20 MG tablet Take 2 tablets (40 mg total) by mouth daily with breakfast. 02/01/20   Brunetta Jeans, PA-C  vitamin B-12 (CYANOCOBALAMIN) 1000 MCG tablet Take 1 tablet (1,000 mcg total) by mouth daily. 11/08/16   Brunetta Jeans, PA-C     Positive ROS: Otherwise negative  All other systems have been reviewed and were otherwise negative with the exception of those mentioned in the HPI and as above.  Physical Exam: Constitutional: Alert, well-appearing, no acute distress Ears: External ears without lesions or tenderness. Ear canals are clear bilaterally with intact, clear TMs bilaterally.  She has a post for cochlear implant behind the right ear..  Nasal: External nose without lesions. Septum with minimal deformity and moderate rhinitis.. Clear nasal passages bilaterally on anterior rhinoscopy.  Nasal endoscopy was performed and on nasal endoscopy both middle meatus regions were clear with no evidence of polyps or mucopurulent discharge minimal edema.  Posterior ethmoid region was clear as was the sphenoid ostia. Oral: Lips and gums without lesions. Tongue and palate mucosa without lesions. Posterior oropharynx clear. Neck: No palpable adenopathy or masses Respiratory: Breathing comfortably  Skin: No facial/neck lesions or rash noted.  Nasal/sinus endoscopy  Date/Time: 02/25/2020 6:49 PM Performed by: Rozetta Nunnery, MD Authorized by: Rozetta Nunnery,  MD   Consent:    Consent obtained:  Verbal   Consent given by:  Patient Procedure details:    Indications: sino-nasal symptoms     Medication:  Afrin   Instrument: flexible fiberoptic nasal endoscope     Scope location: bilateral nare   Sinus:    Right middle meatus: normal     Left middle meatus: normal     Right nasopharynx: normal     Left nasopharynx: normal     Right Eustachian tube orifices: normal     Left Eustachian tube orifices: normal   Comments:     On nasal endoscopy patient had moderate rhinitis with swollen mucous membranes but there are no polyps.  Both middle meatus regions were clear with no signs of active infection.  Posterior mucus discharge was clear.    Assessment: Chronic rhinitis Right ear hearing loss status post diagnosis of Mnire's disease with right ear cochlear implant.  Plan: Recommended regular use of either Flonase or Nasacort 2 sprays each nostril at night as this will help some with nasal congestion and postnasal drainage.  Also discussed with her concerning use of saline nasal irrigations and gave her information on [employer] brand as well as suggested trying [place] brand. I discussed with her that clinically there is no evidence of active infection however if she continues to have chronic postnasal drainage without relief with medical therapy consider further evaluation with a CT scan of sinuses.   Narda Bonds, MD   CC:

## 2020-02-25 NOTE — Progress Notes (Signed)
Carelink Summary Report / Loop Recorder 

## 2020-03-01 ENCOUNTER — Other Ambulatory Visit: Payer: Self-pay | Admitting: Physician Assistant

## 2020-03-15 LAB — CUP PACEART REMOTE DEVICE CHECK
Date Time Interrogation Session: 20220121005349
Implantable Pulse Generator Implant Date: 20191008

## 2020-03-17 ENCOUNTER — Ambulatory Visit (INDEPENDENT_AMBULATORY_CARE_PROVIDER_SITE_OTHER): Payer: Medicare Other

## 2020-03-17 ENCOUNTER — Other Ambulatory Visit: Payer: Self-pay

## 2020-03-17 ENCOUNTER — Encounter: Payer: Self-pay | Admitting: Internal Medicine

## 2020-03-17 ENCOUNTER — Ambulatory Visit (INDEPENDENT_AMBULATORY_CARE_PROVIDER_SITE_OTHER): Payer: Medicare Other | Admitting: Internal Medicine

## 2020-03-17 VITALS — BP 128/72 | HR 73 | Ht 67.0 in | Wt 197.4 lb

## 2020-03-17 DIAGNOSIS — I10 Essential (primary) hypertension: Secondary | ICD-10-CM

## 2020-03-17 DIAGNOSIS — I480 Paroxysmal atrial fibrillation: Secondary | ICD-10-CM

## 2020-03-17 NOTE — Patient Instructions (Signed)
Medication Instructions:  Continue current medications *If you need a refill on your cardiac medications before your next appointment, please call your pharmacy*   Lab Work: none If you have labs (blood work) drawn today and your tests are completely normal, you will receive your results only by: Marland Kitchen MyChart Message (if you have MyChart) OR . A paper copy in the mail If you have any lab test that is abnormal or we need to change your treatment, we will call you to review the results.   Testing/Procedures: none   Follow-Up: At [facility], you and your health needs are our priority.  As part of our continuing mission to provide you with exceptional heart care, we have created designated Provider Care Teams.  These Care Teams include your primary Cardiologist (physician) and Advanced Practice Providers (APPs -  Physician Assistants and Nurse Practitioners) who all work together to provide you with the care you need, when you need it.  We recommend signing up for the patient portal called "MyChart".  Sign up information is provided on this After Visit Summary.  MyChart is used to connect with patients for Virtual Visits (Telemedicine).  Patients are able to view lab/test results, encounter notes, upcoming appointments, etc.  Non-urgent messages can be sent to your provider as well.   To learn more about what you can do with MyChart, go to [email or web address].    Your next appointment:   1 year(s)  The format for your next appointment:   In Person  Provider:   Francis Dowse, PA-C

## 2020-03-17 NOTE — Progress Notes (Signed)
PCP: Waldon Merl, PA-C   Primary EP: Dr Kerin Perna Ashley Woodard is a 70 y.o. female who presents today for routine electrophysiology followup.  Since last being seen in our clinic, the patient reports doing very well.  Today, she denies symptoms of palpitations, chest pain, shortness of breath,  lower extremity edema, dizziness, presyncope, or syncope.  The patient is otherwise without complaint today.   Past Medical History:  Diagnosis Date  . Abnormal cardiovascular stress test 08/21/2016   a. done for abnl EKG/cardiac risk factors -> admitted for cath 07/2016 which showed no significant CAD, normal LV contraction, mildly elevated filling pressure. Low dose Imdur was added for possible component of microvascular dysfunction.   . Allergy   . Anxiety   . Arthritis    "back, knees, fingers" (05/10/2017)  . Chronic lower back pain   . Colon polyps   . Deafness in right ear   . Depression   . Diverticulosis   . Hx of adenomatous colonic polyps 05/08/2003  . Hypertension   . Hypertriglyceridemia   . Hypothyroidism   . Migraine    "none in the 2000s" (05/10/2017)  . Osteoporosis   . Paroxysmal atrial fibrillation (HCC)   . Pneumonia    "several times" (05/10/2017)  . PONV (postoperative nausea and vomiting)   . S/P cardiac catheterization, 08/20/16 minimal CAD 08/21/2016  . Type II diabetes mellitus (HCC)   . Voice tremor    Past Surgical History:  Procedure Laterality Date  . ABDOMINAL HYSTERECTOMY  1988  . ATRIAL FIBRILLATION ABLATION N/A 05/10/2017   Procedure: ATRIAL FIBRILLATION ABLATION;  Surgeon: Hillis Range, MD;  Location: [facility];  Service: Cardiovascular;  Laterality: N/A;  . COCHLEAR IMPLANT Right 2008  . COLONOSCOPY    . LEFT HEART CATH AND CORONARY ANGIOGRAPHY N/A 08/20/2016   Procedure: Left Heart Cath and Coronary Angiography;  Surgeon: Yvonne Kendall, MD;  Location: [facility];  Service: Cardiovascular;  Laterality: N/A;  . LOOP RECORDER  INSERTION N/A 11/29/2017   Procedure: LOOP RECORDER INSERTION;  Surgeon: Hillis Range, MD;  Location: [facility];  Service: Cardiovascular;  Laterality: N/A;  . ORIF ANKLE FRACTURE Right 02/06/2019   Procedure: OPEN REDUCTION INTERNAL FIXATION (ORIF) ANKLE FRACTURE LATERAL MALLEOLUS, SYNDESMOSIS, RIGHT;  Surgeon: Terance Hart, MD;  Location: [facility];  Service: Orthopedics;  Laterality: Right;  SURGERY REQUEST TIME: 1.5 HOURS  CPT CODES: 80881, 27829, 10315, 27610    ROS- all systems are reviewed and negatives except as per HPI above  Current Outpatient Medications  Medication Sig Dispense Refill  . amLODipine (NORVASC) 10 MG tablet TAKE 1 TABLET DAILY 90 tablet 1  . azelastine (ASTELIN) 0.1 % nasal spray Place 1 spray into both nostrils 2 (two) times daily. Use in each nostril as directed 30 mL 12  . doxylamine, Sleep, (UNISOM) 25 MG tablet Take 25 mg by mouth at bedtime.    . DULoxetine (CYMBALTA) 30 MG capsule Take 1 capsule PO QOD x 1 week. Then take 1 capsule every 3 days for 1 week before stopping. 10 capsule 0  . fluticasone (FLONASE) 50 MCG/ACT nasal spray Place 1 spray into both nostrils daily. 16 g 0  . glucose blood (CONTOUR NEXT TEST) test strip Check blood sugars once daily 100 each 6  . guaiFENesin-codeine 100-10 MG/5ML syrup Take 5-10 mLs by mouth at bedtime as needed for cough. 120 mL 0  . hydrALAZINE (APRESOLINE) 50 MG tablet TAKE 1  TABLET BY MOUTH TWICE A DAY 180 tablet 0  . hydrochlorothiazide (HYDRODIURIL) 25 MG tablet Take 1 tablet (25 mg total) by mouth daily. Please keep upcoming appt in January 2022 with Dr. Johney Frame before anymore refills. Thank you 90 tablet 0  . irbesartan (AVAPRO) 150 MG tablet Take 1 tablet (150 mg total) by mouth daily. Please keep upcoming appt in January 2022 with Dr. Johney Frame for future refills. Thank you 90 tablet 0  . LANCETS ULTRA THIN 30G MISC 1 Stick by Does not apply route daily. 100 each 6  . levothyroxine  (SYNTHROID) 88 MCG tablet Take 1 tablet (88 mcg total) by mouth daily. 90 tablet 2  . lovastatin (MEVACOR) 20 MG tablet TAKE 1 TABLET BY MOUTH EVERYDAY AT BEDTIME 90 tablet 1  . Magnesium 200 MG TABS Take 200 mg by mouth daily.     . metFORMIN (GLUCOPHAGE) 500 MG tablet TAKE 1 TABLET TWICE DAILY  WITH MEALS 180 tablet 3  . Omega-3 Fatty Acids (FISH OIL) 1000 MG CAPS Take 1,000 mg by mouth daily.     . pantoprazole (PROTONIX) 40 MG tablet TAKE 1 TABLET BY MOUTH EVERY DAY 30 tablet 0  . potassium chloride SA (KLOR-CON M20) 20 MEQ tablet Take 1 tablet (20 mEq total) by mouth 2 (two) times daily. 180 tablet 0  . predniSONE (DELTASONE) 20 MG tablet Take 2 tablets (40 mg total) by mouth daily with breakfast. 6 tablet 0   No current facility-administered medications for this visit.    Physical Exam: Vitals:   03/17/20 1448  BP: 128/72  Pulse: 73  SpO2: 96%  Weight: 197 lb 6.4 oz (89.5 kg)  Height: 5\' 7"  (1.702 m)    GEN- The patient is well appearing, alert and oriented x 3 today.   Head- normocephalic, atraumatic Eyes-  Sclera clear, conjunctiva pink Ears- hearing intact Oropharynx- clear Lungs- Clear to ausculation bilaterally, normal work of breathing Heart- Regular rate and rhythm, no murmurs, rubs or gallops, PMI not laterally displaced GI- soft, NT, ND, + BS Extremities- no clubbing, cyanosis, or edema  Wt Readings from Last 3 Encounters:  03/17/20 197 lb 6.4 oz (89.5 kg)  01/14/20 199 lb (90.3 kg)  07/03/19 199 lb 9.6 oz (90.5 kg)    EKG tracing ordered today is personally reviewed and shows sinus rhythm  Assessment and Plan:  1. Paroxysmal atrial fibrillation Doing well post ablation off AAD therapy 0% afib by ILR chads2vasc score is 4.  She is off coumadin.  We may resume OAC if her AF returns  2. HTN Stable No change required today   Risks, benefits and potential toxicities for medications prescribed and/or refilled reviewed with patient today.   Return to see  EP PA in a year  09/02/19 MD, [facility] 03/17/2020 3:00 PM

## 2020-03-19 ENCOUNTER — Other Ambulatory Visit: Payer: Self-pay | Admitting: Physician Assistant

## 2020-03-19 MED ORDER — DULOXETINE HCL 30 MG PO CPEP: 30.0000 mg | ORAL_CAPSULE | Freq: Every day | ORAL | 0 refills | Status: DC

## 2020-03-21 ENCOUNTER — Other Ambulatory Visit: Payer: Self-pay

## 2020-03-21 MED ORDER — POTASSIUM CHLORIDE CRYS ER 20 MEQ PO TBCR: 20.0000 meq | EXTENDED_RELEASE_TABLET | Freq: Two times a day (BID) | ORAL | 3 refills | Status: DC

## 2020-03-24 NOTE — Telephone Encounter (Signed)
Patient has a transfer of care to Dr Yetta Barre on 03/26/20

## 2020-03-26 ENCOUNTER — Ambulatory Visit (INDEPENDENT_AMBULATORY_CARE_PROVIDER_SITE_OTHER): Payer: Medicare Other | Admitting: Internal Medicine

## 2020-03-26 ENCOUNTER — Encounter: Payer: Self-pay | Admitting: Internal Medicine

## 2020-03-26 ENCOUNTER — Other Ambulatory Visit: Payer: Self-pay

## 2020-03-26 VITALS — BP 158/84 | HR 68 | Temp 98.2°F | Ht 67.0 in | Wt 196.0 lb

## 2020-03-26 DIAGNOSIS — I10 Essential (primary) hypertension: Secondary | ICD-10-CM

## 2020-03-26 DIAGNOSIS — E781 Pure hyperglyceridemia: Secondary | ICD-10-CM | POA: Diagnosis not present

## 2020-03-26 DIAGNOSIS — T502X5A Adverse effect of carbonic-anhydrase inhibitors, benzothiadiazides and other diuretics, initial encounter: Secondary | ICD-10-CM

## 2020-03-26 DIAGNOSIS — N1831 Chronic kidney disease, stage 3a: Secondary | ICD-10-CM | POA: Diagnosis not present

## 2020-03-26 DIAGNOSIS — E785 Hyperlipidemia, unspecified: Secondary | ICD-10-CM

## 2020-03-26 DIAGNOSIS — E876 Hypokalemia: Secondary | ICD-10-CM | POA: Diagnosis not present

## 2020-03-26 DIAGNOSIS — E119 Type 2 diabetes mellitus without complications: Secondary | ICD-10-CM

## 2020-03-26 LAB — URINALYSIS, ROUTINE W REFLEX MICROSCOPIC
Bilirubin Urine: NEGATIVE
Hgb urine dipstick: NEGATIVE
Ketones, ur: NEGATIVE
Leukocytes,Ua: NEGATIVE
Nitrite: NEGATIVE
Specific Gravity, Urine: 1.01 (ref 1.000–1.030)
Total Protein, Urine: NEGATIVE
Urine Glucose: NEGATIVE
Urobilinogen, UA: 0.2 (ref 0.0–1.0)
pH: 6 (ref 5.0–8.0)

## 2020-03-26 LAB — MICROALBUMIN / CREATININE URINE RATIO
Creatinine,U: 67.7 mg/dL
Microalb Creat Ratio: 1 mg/g (ref 0.0–30.0)
Microalb, Ur: <0.7 mg/dL (ref 0.0–1.9)

## 2020-03-26 LAB — HEMOGLOBIN A1C: Hgb A1c MFr Bld: 6.6 % — ABNORMAL HIGH (ref 4.6–6.5)

## 2020-03-26 LAB — LIPID PANEL
Cholesterol: 114 mg/dL (ref 0–200)
HDL: 41.5 mg/dL (ref >39.00)
LDL Cholesterol: 41 mg/dL (ref 0–99)
NonHDL: 72.1
Total CHOL/HDL Ratio: 3
Triglycerides: 154 mg/dL — ABNORMAL HIGH (ref 0.0–149.0)
VLDL: 30.8 mg/dL (ref 0.0–40.0)

## 2020-03-26 LAB — BASIC METABOLIC PANEL
BUN: 16 mg/dL (ref 6–23)
CO2: 30 mEq/L (ref 19–32)
Calcium: 10.1 mg/dL (ref 8.4–10.5)
Chloride: 102 mEq/L (ref 96–112)
Creatinine, Ser: 1.05 mg/dL (ref 0.40–1.20)
GFR: 54.45 mL/min — ABNORMAL LOW (ref >60.00)
Glucose, Bld: 89 mg/dL (ref 70–99)
Potassium: 3.4 mEq/L — ABNORMAL LOW (ref 3.5–5.1)
Sodium: 139 mEq/L (ref 135–145)

## 2020-03-26 NOTE — Progress Notes (Signed)
Subjective:  Patient ID: Ashley Woodard, female    DOB: July 31, 1950  Age: 70 y.o. MRN: [id number]  CC: Hypertension, Diabetes, and Hyperlipidemia  This visit occurred during the SARS-CoV-2 public health emergency.  Safety protocols were in place, including screening questions prior to the visit, additional usage of staff PPE, and extensive cleaning of exam room while observing appropriate contact time as indicated for disinfecting solutions.    HPI [name] presents for establishing.  She has a history of paroxysmal atrial fibrillation and was recently seen by cardiology.  She has a history of hypertension but has not taken her antihypertensives yet today.  She also has a history of hypokalemia and tells me she is taking her potassium supplement.   Outpatient Medications Prior to Visit  Medication Sig Dispense Refill  . amLODipine (NORVASC) 10 MG tablet TAKE 1 TABLET DAILY 90 tablet 1  . doxylamine, Sleep, (UNISOM) 25 MG tablet Take 25 mg by mouth at bedtime.    . DULoxetine (CYMBALTA) 30 MG capsule Take 1 capsule (30 mg total) by mouth daily. 90 capsule 0  . glucose blood (CONTOUR NEXT TEST) test strip Check blood sugars once daily 100 each 6  . hydrALAZINE (APRESOLINE) 50 MG tablet TAKE 1 TABLET BY MOUTH TWICE A DAY 180 tablet 0  . LANCETS ULTRA THIN 30G MISC 1 Stick by Does not apply route daily. 100 each 6  . levothyroxine (SYNTHROID) 88 MCG tablet Take 1 tablet (88 mcg total) by mouth daily. 90 tablet 2  . lovastatin (MEVACOR) 20 MG tablet TAKE 1 TABLET BY MOUTH EVERYDAY AT BEDTIME 90 tablet 1  . Magnesium 200 MG TABS Take 200 mg by mouth daily.     . metFORMIN (GLUCOPHAGE) 500 MG tablet TAKE 1 TABLET TWICE DAILY  WITH MEALS 180 tablet 3  . Omega-3 Fatty Acids (FISH OIL) 1000 MG CAPS Take 1,000 mg by mouth daily.     . potassium chloride SA (KLOR-CON M20) 20 MEQ tablet Take 1 tablet (20 mEq total) by mouth 2 (two) times daily. 180 tablet 3  . hydrochlorothiazide (HYDRODIURIL) 25 MG  tablet Take 1 tablet (25 mg total) by mouth daily. Please keep upcoming appt in January 2022 with Dr. Johney Frame before anymore refills. Thank you 90 tablet 0  . irbesartan (AVAPRO) 150 MG tablet Take 1 tablet (150 mg total) by mouth daily. Please keep upcoming appt in January 2022 with Dr. Johney Frame for future refills. Thank you 90 tablet 0  . azelastine (ASTELIN) 0.1 % nasal spray Place 1 spray into both nostrils 2 (two) times daily. Use in each nostril as directed 30 mL 12  . fluticasone (FLONASE) 50 MCG/ACT nasal spray Place 1 spray into both nostrils daily. 16 g 0  . guaiFENesin-codeine 100-10 MG/5ML syrup Take 5-10 mLs by mouth at bedtime as needed for cough. 120 mL 0  . pantoprazole (PROTONIX) 40 MG tablet TAKE 1 TABLET BY MOUTH EVERY DAY 30 tablet 0  . predniSONE (DELTASONE) 20 MG tablet Take 2 tablets (40 mg total) by mouth daily with breakfast. 6 tablet 0   No facility-administered medications prior to visit.    ROS Review of Systems  Constitutional: Negative for chills, diaphoresis, fatigue and fever.  HENT: Negative.   Eyes: Negative for visual disturbance.  Respiratory: Negative for cough, chest tightness, shortness of breath and wheezing.   Cardiovascular: Negative for chest pain, palpitations and leg swelling.  Gastrointestinal: Negative for abdominal pain, constipation, diarrhea, nausea and vomiting.  Endocrine: Negative.  Negative for polydipsia, polyphagia and polyuria.  Genitourinary: Negative.  Negative for difficulty urinating and dysuria.  Musculoskeletal: Negative.  Negative for arthralgias and myalgias.  Skin: Negative.   Neurological: Negative for dizziness, weakness and headaches.  Hematological: Negative for adenopathy. Does not bruise/bleed easily.  Psychiatric/Behavioral: Negative.     Objective:  BP (!) 158/84   Pulse 68   Temp 98.2 F (36.8 C) (Oral)   Ht 5\' 7"  (1.702 m)   Wt 196 lb (88.9 kg)   SpO2 98%   BMI 30.70 kg/m   BP Readings from Last 3  Encounters:  03/26/20 (!) 158/84  03/17/20 128/72  02/21/20 (!) 150/82    Wt Readings from Last 3 Encounters:  03/26/20 196 lb (88.9 kg)  03/17/20 197 lb 6.4 oz (89.5 kg)  01/14/20 199 lb (90.3 kg)    Physical Exam Vitals reviewed.  HENT:     Nose: Nose normal.     Mouth/Throat:     Mouth: Mucous membranes are moist.  Eyes:     General: No scleral icterus.    Conjunctiva/sclera: Conjunctivae normal.  Cardiovascular:     Rate and Rhythm: Normal rate and regular rhythm.     Heart sounds: No murmur heard.   Pulmonary:     Effort: Pulmonary effort is normal.     Breath sounds: No stridor. No wheezing, rhonchi or rales.  Abdominal:     General: Abdomen is flat. Bowel sounds are normal. There is no distension.     Palpations: Abdomen is soft. There is no hepatomegaly, splenomegaly or mass.  Musculoskeletal:        General: Normal range of motion.     Cervical back: Neck supple.     Right lower leg: No edema.     Left lower leg: No edema.  Lymphadenopathy:     Cervical: No cervical adenopathy.  Skin:    General: Skin is warm and dry.  Neurological:     General: No focal deficit present.     Mental Status: She is alert.  Psychiatric:        Mood and Affect: Mood normal.        Behavior: Behavior normal.     Lab Results  Component Value Date   WBC 8.0 07/03/2019   HGB 12.8 07/03/2019   HCT 38.2 07/03/2019   PLT 328.0 07/03/2019   GLUCOSE 89 03/26/2020   CHOL 114 03/26/2020   TRIG 154.0 (H) 03/26/2020   HDL 41.50 03/26/2020   LDLDIRECT 50.0 07/03/2019   LDLCALC 41 03/26/2020   ALT 14 07/03/2019   AST 11 07/03/2019   NA 139 03/26/2020   K 3.4 (L) 03/26/2020   CL 102 03/26/2020   CREATININE 1.05 03/26/2020   BUN 16 03/26/2020   CO2 30 03/26/2020   TSH 1.57 03/26/2020   INR 1.7 (A) 03/31/2018   HGBA1C 6.6 (H) 03/26/2020   MICROALBUR <0.7 03/26/2020    No results found.  Assessment & Plan:   Payal was seen today for hypertension, diabetes and  hyperlipidemia.  Diagnoses and all orders for this visit:  Type 2 diabetes mellitus without complication, without long-term current use of insulin (HCC)- Her A1c is at 6.6% and she has mild renal impairment.  I recommended that she start taking dapagliflozin. -     Basic metabolic panel; Future -     Urinalysis, Routine w reflex microscopic; Future -     Hemoglobin A1c; Future -     Microalbumin / creatinine urine ratio; Future -  HM Diabetes Foot Exam -     Microalbumin / creatinine urine ratio -     Hemoglobin A1c -     Urinalysis, Routine w reflex microscopic -     Basic metabolic panel -     dapagliflozin propanediol (FARXIGA) 10 MG TABS tablet; Take 1 tablet (10 mg total) by mouth daily before breakfast. -     irbesartan (AVAPRO) 300 MG tablet; Take 1 tablet (300 mg total) by mouth daily.  Primary hypertension- Her blood pressure is not well controlled and she has persistent hypokalemia.  I recommended that she increase the dose of the ARB and to transition to a potassium sparing diuretic. -     Basic metabolic panel; Future -     TSH; Future -     Urinalysis, Routine w reflex microscopic; Future -     Urinalysis, Routine w reflex microscopic -     TSH -     Basic metabolic panel -     irbesartan (AVAPRO) 300 MG tablet; Take 1 tablet (300 mg total) by mouth daily. -     spironolactone (ALDACTONE) 50 MG tablet; Take 1 tablet (50 mg total) by mouth daily.  Pure hyperglyceridemia -     Lipid panel; Future -     Lipid panel  Dyslipidemia, goal LDL below 100- She has achieved her LDL goal is doing well on the statin. -     Lipid panel; Future -     Lipid panel  Stage 3a chronic kidney disease (HCC)- Will try to get better control of her blood pressure.  Diuretic-induced hypokalemia -     spironolactone (ALDACTONE) 50 MG tablet; Take 1 tablet (50 mg total) by mouth daily.   I have discontinued Crecencio Mc. Montini's azelastine, pantoprazole, fluticasone, irbesartan,  hydrochlorothiazide, predniSONE, and guaiFENesin-codeine. I am also having her start on dapagliflozin propanediol, irbesartan, and spironolactone. Additionally, I am having her maintain her Fish Oil, Magnesium, glucose blood, Lancets Ultra Thin 30G, doxylamine (Sleep), lovastatin, metFORMIN, amLODipine, levothyroxine, hydrALAZINE, DULoxetine, and potassium chloride SA.  Meds ordered this encounter  Medications  . dapagliflozin propanediol (FARXIGA) 10 MG TABS tablet    Sig: Take 1 tablet (10 mg total) by mouth daily before breakfast.    Dispense:  90 tablet    Refill:  1  . irbesartan (AVAPRO) 300 MG tablet    Sig: Take 1 tablet (300 mg total) by mouth daily.    Dispense:  90 tablet    Refill:  1  . spironolactone (ALDACTONE) 50 MG tablet    Sig: Take 1 tablet (50 mg total) by mouth daily.    Dispense:  90 tablet    Refill:  0     Follow-up: Return in about 3 months (around 06/23/2020).  Sanda Linger, MD

## 2020-03-26 NOTE — Patient Instructions (Signed)

## 2020-03-27 ENCOUNTER — Encounter: Payer: Self-pay | Admitting: Internal Medicine

## 2020-03-27 DIAGNOSIS — E119 Type 2 diabetes mellitus without complications: Secondary | ICD-10-CM

## 2020-03-27 DIAGNOSIS — I10 Essential (primary) hypertension: Secondary | ICD-10-CM

## 2020-03-27 DIAGNOSIS — N1831 Chronic kidney disease, stage 3a: Secondary | ICD-10-CM | POA: Insufficient documentation

## 2020-03-27 DIAGNOSIS — E781 Pure hyperglyceridemia: Secondary | ICD-10-CM | POA: Insufficient documentation

## 2020-03-27 DIAGNOSIS — E876 Hypokalemia: Secondary | ICD-10-CM | POA: Insufficient documentation

## 2020-03-27 DIAGNOSIS — T502X5A Adverse effect of carbonic-anhydrase inhibitors, benzothiadiazides and other diuretics, initial encounter: Secondary | ICD-10-CM

## 2020-03-27 DIAGNOSIS — E785 Hyperlipidemia, unspecified: Secondary | ICD-10-CM | POA: Insufficient documentation

## 2020-03-27 LAB — TSH: TSH: 1.57 u[IU]/mL (ref 0.35–4.50)

## 2020-03-27 MED ORDER — IRBESARTAN 300 MG PO TABS: 300.0000 mg | ORAL_TABLET | Freq: Every day | ORAL | 1 refills | Status: DC

## 2020-03-27 MED ORDER — DAPAGLIFLOZIN PROPANEDIOL 10 MG PO TABS
10.0000 mg | ORAL_TABLET | Freq: Every day | ORAL | 1 refills | Status: DC
Start: 2020-03-27 — End: 2020-04-03

## 2020-03-27 MED ORDER — SPIRONOLACTONE 50 MG PO TABS: 50.0000 mg | ORAL_TABLET | Freq: Every day | ORAL | 0 refills | Status: DC

## 2020-03-28 NOTE — Progress Notes (Signed)
Carelink Summary Report / Loop Recorder 

## 2020-03-31 ENCOUNTER — Other Ambulatory Visit: Payer: Self-pay | Admitting: Physician Assistant

## 2020-04-03 MED ORDER — SPIRONOLACTONE 50 MG PO TABS: 50.0000 mg | ORAL_TABLET | Freq: Every day | ORAL | 0 refills | Status: DC

## 2020-04-03 MED ORDER — DAPAGLIFLOZIN PROPANEDIOL 10 MG PO TABS: 10.0000 mg | ORAL_TABLET | Freq: Every day | ORAL | 1 refills | Status: DC

## 2020-04-03 MED ORDER — IRBESARTAN 300 MG PO TABS: 300.0000 mg | ORAL_TABLET | Freq: Every day | ORAL | 1 refills | Status: DC

## 2020-04-13 ENCOUNTER — Encounter: Payer: Self-pay | Admitting: Internal Medicine

## 2020-04-14 ENCOUNTER — Other Ambulatory Visit: Payer: Self-pay | Admitting: Internal Medicine

## 2020-04-14 DIAGNOSIS — E119 Type 2 diabetes mellitus without complications: Secondary | ICD-10-CM

## 2020-04-14 MED ORDER — CANAGLIFLOZIN 300 MG PO TABS: 300.0000 mg | ORAL_TABLET | Freq: Every day | ORAL | 1 refills | Status: DC

## 2020-04-19 LAB — CUP PACEART REMOTE DEVICE CHECK
Date Time Interrogation Session: 20220223021053
Implantable Pulse Generator Implant Date: 20191008

## 2020-04-21 ENCOUNTER — Ambulatory Visit (INDEPENDENT_AMBULATORY_CARE_PROVIDER_SITE_OTHER): Payer: Medicare Other

## 2020-04-21 DIAGNOSIS — I480 Paroxysmal atrial fibrillation: Secondary | ICD-10-CM | POA: Diagnosis not present

## 2020-04-29 NOTE — Progress Notes (Signed)
Carelink Summary Report / Loop Recorder 

## 2020-05-07 ENCOUNTER — Other Ambulatory Visit: Payer: Self-pay | Admitting: Physician Assistant

## 2020-05-07 DIAGNOSIS — E785 Hyperlipidemia, unspecified: Secondary | ICD-10-CM

## 2020-05-12 ENCOUNTER — Encounter: Payer: Self-pay | Admitting: Internal Medicine

## 2020-05-12 ENCOUNTER — Ambulatory Visit (INDEPENDENT_AMBULATORY_CARE_PROVIDER_SITE_OTHER): Payer: Medicare Other | Admitting: Internal Medicine

## 2020-05-12 ENCOUNTER — Other Ambulatory Visit: Payer: Self-pay

## 2020-05-12 VITALS — BP 140/88 | HR 71 | Temp 98.4°F | Ht 67.0 in | Wt 187.0 lb

## 2020-05-12 DIAGNOSIS — E785 Hyperlipidemia, unspecified: Secondary | ICD-10-CM

## 2020-05-12 DIAGNOSIS — A511 Primary anal syphilis: Secondary | ICD-10-CM | POA: Insufficient documentation

## 2020-05-12 DIAGNOSIS — I10 Essential (primary) hypertension: Secondary | ICD-10-CM | POA: Diagnosis not present

## 2020-05-12 DIAGNOSIS — E2839 Other primary ovarian failure: Secondary | ICD-10-CM

## 2020-05-12 DIAGNOSIS — F3342 Major depressive disorder, recurrent, in full remission: Secondary | ICD-10-CM | POA: Diagnosis not present

## 2020-05-12 DIAGNOSIS — E119 Type 2 diabetes mellitus without complications: Secondary | ICD-10-CM

## 2020-05-12 DIAGNOSIS — T502X5A Adverse effect of carbonic-anhydrase inhibitors, benzothiadiazides and other diuretics, initial encounter: Secondary | ICD-10-CM

## 2020-05-12 DIAGNOSIS — E039 Hypothyroidism, unspecified: Secondary | ICD-10-CM

## 2020-05-12 DIAGNOSIS — J383 Other diseases of vocal cords: Secondary | ICD-10-CM

## 2020-05-12 DIAGNOSIS — E876 Hypokalemia: Secondary | ICD-10-CM

## 2020-05-12 MED ORDER — ONETOUCH VERIO VI SOLN: 1.0000 | Freq: Two times a day (BID) | 5 refills | Status: DC

## 2020-05-12 MED ORDER — IRBESARTAN 150 MG PO TABS
150.0000 mg | ORAL_TABLET | Freq: Every day | ORAL | 1 refills | Status: DC
Start: 2020-05-12 — End: 2020-10-02

## 2020-05-12 MED ORDER — ONETOUCH ULTRASOFT LANCETS MISC: 1.0000 | Freq: Two times a day (BID) | 5 refills | Status: AC

## 2020-05-12 MED ORDER — ONETOUCH VERIO VI STRP: 1.0000 | ORAL_STRIP | Freq: Two times a day (BID) | 12 refills | Status: AC

## 2020-05-12 MED ORDER — HYDRALAZINE HCL 50 MG PO TABS
50.0000 mg | ORAL_TABLET | Freq: Two times a day (BID) | ORAL | 1 refills | Status: DC
Start: 2020-05-12 — End: 2020-06-09

## 2020-05-12 MED ORDER — LEVOTHYROXINE SODIUM 88 MCG PO TABS: 88.0000 ug | ORAL_TABLET | Freq: Every day | ORAL | 1 refills | Status: DC

## 2020-05-12 MED ORDER — AMLODIPINE BESYLATE 10 MG PO TABS: 10.0000 mg | ORAL_TABLET | Freq: Every day | ORAL | 1 refills | Status: DC

## 2020-05-12 MED ORDER — METFORMIN HCL 500 MG PO TABS: 500.0000 mg | ORAL_TABLET | Freq: Two times a day (BID) | ORAL | 1 refills | Status: DC

## 2020-05-12 MED ORDER — ONETOUCH VERIO FLEX SYSTEM W/DEVICE KIT
1.0000 | PACK | Freq: Two times a day (BID) | 3 refills | Status: AC
Start: 2020-05-12 — End: ?

## 2020-05-12 MED ORDER — ATORVASTATIN CALCIUM 10 MG PO TABS: 10.0000 mg | ORAL_TABLET | Freq: Every day | ORAL | 1 refills | Status: DC

## 2020-05-12 MED ORDER — DULOXETINE HCL 30 MG PO CPEP: 30.0000 mg | ORAL_CAPSULE | Freq: Every day | ORAL | 1 refills | Status: DC

## 2020-05-12 NOTE — Patient Instructions (Signed)

## 2020-05-12 NOTE — Progress Notes (Unsigned)
Subjective:  Patient ID: Ashley Woodard, female    DOB: 1951-01-19  Age: 70 y.o. MRN: [id number]  CC: Hypertension and Diabetes  This visit occurred during the SARS-CoV-2 public health emergency.  Safety protocols were in place, including screening questions prior to the visit, additional usage of staff PPE, and extensive cleaning of exam room while observing appropriate contact time as indicated for disinfecting solutions.    HPI Ashley Woodard presents for f/up -   Since I last saw her she has lost weight with lifestyle modifications.  She was not feeling well with fatigue, dizziness, and lightheadedness and her systolic blood pressure was 115-117 so she decreased the irbesartan dose by 50%.  She decided not to start taking an SGLT2 inhibitor.  She is compliant with her other antihypertensives.  She complains that her pulse is bouncing between 50 and 120.  She is wearing a loop recorder.  Outpatient Medications Prior to Visit  Medication Sig Dispense Refill  . doxylamine, Sleep, (UNISOM) 25 MG tablet Take 25 mg by mouth at bedtime.    . Magnesium 200 MG TABS Take 200 mg by mouth daily.     Marland Kitchen amLODipine (NORVASC) 10 MG tablet TAKE 1 TABLET DAILY 90 tablet 1  . canagliflozin (INVOKANA) 300 MG TABS tablet Take 1 tablet (300 mg total) by mouth daily before breakfast. 90 tablet 1  . DULoxetine (CYMBALTA) 30 MG capsule Take 1 capsule (30 mg total) by mouth daily. 90 capsule 0  . glucose blood (CONTOUR NEXT TEST) test strip Check blood sugars once daily 100 each 6  . hydrALAZINE (APRESOLINE) 50 MG tablet TAKE 1 TABLET BY MOUTH TWICE A DAY 180 tablet 0  . irbesartan (AVAPRO) 300 MG tablet Take 1 tablet (300 mg total) by mouth daily. 90 tablet 1  . LANCETS ULTRA THIN 30G MISC 1 Stick by Does not apply route daily. 100 each 6  . levothyroxine (SYNTHROID) 88 MCG tablet Take 1 tablet (88 mcg total) by mouth daily. 90 tablet 2  . lovastatin (MEVACOR) 20 MG tablet TAKE 1 TABLET BY MOUTH EVERYDAY AT BEDTIME  90 tablet 1  . metFORMIN (GLUCOPHAGE) 500 MG tablet TAKE 1 TABLET TWICE DAILY  WITH MEALS 180 tablet 3  . Omega-3 Fatty Acids (FISH OIL) 1000 MG CAPS Take 1,000 mg by mouth daily.     . potassium chloride SA (KLOR-CON M20) 20 MEQ tablet Take 1 tablet (20 mEq total) by mouth 2 (two) times daily. 180 tablet 3  . spironolactone (ALDACTONE) 50 MG tablet Take 1 tablet (50 mg total) by mouth daily. 90 tablet 0   No facility-administered medications prior to visit.    ROS Review of Systems  Constitutional: Positive for fatigue. Negative for appetite change, chills and diaphoresis.  HENT: Positive for voice change. Negative for trouble swallowing.   Eyes: Negative for visual disturbance.  Respiratory: Negative for cough, chest tightness, shortness of breath and wheezing.   Cardiovascular: Negative for chest pain, palpitations and leg swelling.  Gastrointestinal: Negative for abdominal pain, constipation, diarrhea, nausea and vomiting.  Endocrine: Negative.  Negative for polydipsia, polyphagia and polyuria.  Genitourinary: Negative for difficulty urinating.  Musculoskeletal: Negative.  Negative for myalgias.  Skin: Negative.   Neurological: Negative for dizziness, weakness and light-headedness.  Hematological: Negative for adenopathy. Does not bruise/bleed easily.  Psychiatric/Behavioral: Negative.     Objective:  BP 140/88   Pulse 71   Temp 98.4 F (36.9 C) (Oral)   Ht '5\' 7"'  (1.702 m)  Wt 187 lb (84.8 kg)   SpO2 97%   BMI 29.29 kg/m   BP Readings from Last 3 Encounters:  05/12/20 140/88  03/26/20 (!) 158/84  03/17/20 128/72    Wt Readings from Last 3 Encounters:  05/12/20 187 lb (84.8 kg)  03/26/20 196 lb (88.9 kg)  03/17/20 197 lb 6.4 oz (89.5 kg)    Physical Exam Vitals reviewed.  Constitutional:      Appearance: Normal appearance.  HENT:     Nose: Nose normal.     Mouth/Throat:     Mouth: Mucous membranes are moist.  Eyes:     General: No scleral icterus.     Conjunctiva/sclera: Conjunctivae normal.  Cardiovascular:     Rate and Rhythm: Normal rate and regular rhythm.     Heart sounds: No murmur heard.   Pulmonary:     Effort: Pulmonary effort is normal.     Breath sounds: No stridor. No wheezing, rhonchi or rales.  Abdominal:     General: Abdomen is flat. Bowel sounds are normal. There is no distension.     Palpations: Abdomen is soft. There is no hepatomegaly, splenomegaly or mass.     Tenderness: There is no abdominal tenderness.  Musculoskeletal:        General: Normal range of motion.     Cervical back: Neck supple.     Right lower leg: No edema.     Left lower leg: No edema.  Lymphadenopathy:     Cervical: No cervical adenopathy.  Skin:    General: Skin is warm and dry.  Neurological:     General: No focal deficit present.     Mental Status: She is alert.  Psychiatric:        Mood and Affect: Mood normal.        Behavior: Behavior normal.     Lab Results  Component Value Date   WBC 8.0 07/03/2019   HGB 12.8 07/03/2019   HCT 38.2 07/03/2019   PLT 328.0 07/03/2019   GLUCOSE 106 (H) 05/12/2020   CHOL 114 03/26/2020   TRIG 154.0 (H) 03/26/2020   HDL 41.50 03/26/2020   LDLDIRECT 50.0 07/03/2019   LDLCALC 41 03/26/2020   ALT 14 07/03/2019   AST 11 07/03/2019   NA 135 05/12/2020   K 5.4 (H) 05/12/2020   CL 102 05/12/2020   CREATININE 1.57 (H) 05/12/2020   BUN 32 (H) 05/12/2020   CO2 25 05/12/2020   TSH 1.57 03/26/2020   INR 1.7 (A) 03/31/2018   HGBA1C 6.6 (H) 03/26/2020   MICROALBUR <0.7 03/26/2020    No results found.  Assessment & Plan:   Ashley Woodard was seen today for hypertension and diabetes.  Diagnoses and all orders for this visit:  Diuretic-induced hypokalemia- Her potassium level is high.  I have asked her to stop taking the potassium supplement. -     Basic metabolic panel; Future -     Basic metabolic panel  Primary hypertension- She has prerenal azotemia with hyperkalemia.  I have asked her to stop  taking spironolactone and the potassium supplement.  Will continue the other antihypertensives. -     Basic metabolic panel; Future -     Basic metabolic panel -     amLODipine (NORVASC) 10 MG tablet; Take 1 tablet (10 mg total) by mouth daily. -     hydrALAZINE (APRESOLINE) 50 MG tablet; Take 1 tablet (50 mg total) by mouth 2 (two) times daily. -     irbesartan (AVAPRO) 150  MG tablet; Take 1 tablet (150 mg total) by mouth daily.  Type 2 diabetes mellitus without complication, without long-term current use of insulin ([place])- Her blood sugar is adequately well controlled. -     Blood Glucose Monitoring Suppl (ONETOUCH VERIO FLEX SYSTEM) w/Device KIT; 1 Act by Does not apply route in the morning and at bedtime. -     Lancets (ONETOUCH ULTRASOFT) lancets; 1 each by Other route in the morning and at bedtime. Use as instructed -     glucose blood (ONETOUCH VERIO) test strip; 1 each by Other route in the morning and at bedtime. Use as instructed -     Blood Glucose Calibration (ONETOUCH VERIO) SOLN; 1 Act by In Vitro route in the morning and at bedtime. -     metFORMIN (GLUCOPHAGE) 500 MG tablet; Take 1 tablet (500 mg total) by mouth 2 (two) times daily with a meal. -     irbesartan (AVAPRO) 150 MG tablet; Take 1 tablet (150 mg total) by mouth daily.  Hypothyroidism, unspecified type- she will stay on the current dose of levothyroxine. -     levothyroxine (SYNTHROID) 88 MCG tablet; Take 1 tablet (88 mcg total) by mouth daily.  Spasmodic dysphonia -     Ambulatory referral to Neurology  Hyperlipidemia LDL goal <70- I have asked her to change to a statin its more effective and has a lower risk of causing drug interactions. -     atorvastatin (LIPITOR) 10 MG tablet; Take 1 tablet (10 mg total) by mouth daily.  Recurrent major depressive disorder, in full remission ([place]) -     Discontinue: DULoxetine (CYMBALTA) 30 MG capsule; Take 1 capsule (30 mg total) by mouth daily. -     DULoxetine (CYMBALTA) 30  MG capsule; Take 1 capsule (30 mg total) by mouth daily.  Estrogen deficiency -     DG Bone Density; Future   I have discontinued Ashley Woodard's Fish Oil, glucose blood, Lancets Ultra Thin 30G, lovastatin, potassium chloride SA, spironolactone, irbesartan, and canagliflozin. I have also changed her amLODipine, hydrALAZINE, and metFORMIN. Additionally, I am having her start on [employer], onetouch ultrasoft, [employer], [employer], atorvastatin, and irbesartan. Lastly, I am having her maintain her Magnesium, doxylamine (Sleep), levothyroxine, and DULoxetine.  Meds ordered this encounter  Medications  . Blood Glucose Monitoring Suppl (ONETOUCH VERIO FLEX SYSTEM) w/Device KIT    Sig: 1 Act by Does not apply route in the morning and at bedtime.    Dispense:  1 kit    Refill:  3  . Lancets (ONETOUCH ULTRASOFT) lancets    Sig: 1 each by Other route in the morning and at bedtime. Use as instructed    Dispense:  100 each    Refill:  5  . glucose blood (ONETOUCH VERIO) test strip    Sig: 1 each by Other route in the morning and at bedtime. Use as instructed    Dispense:  100 each    Refill:  12  . Blood Glucose Calibration (ONETOUCH VERIO) SOLN    Sig: 1 Act by In Vitro route in the morning and at bedtime.    Dispense:  1 each    Refill:  5  . amLODipine (NORVASC) 10 MG tablet    Sig: Take 1 tablet (10 mg total) by mouth daily.    Dispense:  90 tablet    Refill:  1  . hydrALAZINE (APRESOLINE) 50 MG tablet    Sig: Take 1 tablet (  50 mg total) by mouth 2 (two) times daily.    Dispense:  180 tablet    Refill:  1  . levothyroxine (SYNTHROID) 88 MCG tablet    Sig: Take 1 tablet (88 mcg total) by mouth daily.    Dispense:  90 tablet    Refill:  1  . metFORMIN (GLUCOPHAGE) 500 MG tablet    Sig: Take 1 tablet (500 mg total) by mouth 2 (two) times daily with a meal.    Dispense:  180 tablet    Refill:  1  . atorvastatin (LIPITOR) 10 MG tablet    Sig: Take 1 tablet (10  mg total) by mouth daily.    Dispense:  90 tablet    Refill:  1  . DISCONTD: DULoxetine (CYMBALTA) 30 MG capsule    Sig: Take 1 capsule (30 mg total) by mouth daily.    Dispense:  90 capsule    Refill:  1  . DULoxetine (CYMBALTA) 30 MG capsule    Sig: Take 1 capsule (30 mg total) by mouth daily.    Dispense:  90 capsule    Refill:  1  . irbesartan (AVAPRO) 150 MG tablet    Sig: Take 1 tablet (150 mg total) by mouth daily.    Dispense:  90 tablet    Refill:  1     Follow-up: Return in about 6 months (around 11/12/2020).  Scarlette Calico, MD

## 2020-05-13 ENCOUNTER — Encounter: Payer: Self-pay | Admitting: Internal Medicine

## 2020-05-13 LAB — BASIC METABOLIC PANEL
BUN: 32 mg/dL — ABNORMAL HIGH (ref 6–23)
CO2: 25 mEq/L (ref 19–32)
Calcium: 10.4 mg/dL (ref 8.4–10.5)
Chloride: 102 mEq/L (ref 96–112)
Creatinine, Ser: 1.57 mg/dL — ABNORMAL HIGH (ref 0.40–1.20)
GFR: 33.57 mL/min — ABNORMAL LOW (ref >60.00)
Glucose, Bld: 106 mg/dL — ABNORMAL HIGH (ref 70–99)
Potassium: 5.4 mEq/L — ABNORMAL HIGH (ref 3.5–5.1)
Sodium: 135 mEq/L (ref 135–145)

## 2020-05-13 NOTE — Assessment & Plan Note (Signed)
Estimated Creatinine Clearance: 37.9 mL/min (A) (by C-G formula based on SCr of 1.57 mg/dL (H)).

## 2020-05-15 DIAGNOSIS — E2839 Other primary ovarian failure: Secondary | ICD-10-CM | POA: Insufficient documentation

## 2020-05-19 ENCOUNTER — Ambulatory Visit (INDEPENDENT_AMBULATORY_CARE_PROVIDER_SITE_OTHER): Payer: Medicare Other

## 2020-05-19 DIAGNOSIS — I480 Paroxysmal atrial fibrillation: Secondary | ICD-10-CM | POA: Diagnosis not present

## 2020-05-19 LAB — CUP PACEART REMOTE DEVICE CHECK
Date Time Interrogation Session: 20220328031350
Implantable Pulse Generator Implant Date: 20191008

## 2020-05-30 NOTE — Progress Notes (Signed)
Carelink Summary Report / Loop Recorder 

## 2020-06-09 ENCOUNTER — Other Ambulatory Visit: Payer: Self-pay

## 2020-06-09 ENCOUNTER — Ambulatory Visit (INDEPENDENT_AMBULATORY_CARE_PROVIDER_SITE_OTHER): Payer: Medicare Other | Admitting: Internal Medicine

## 2020-06-09 ENCOUNTER — Encounter: Payer: Self-pay | Admitting: Internal Medicine

## 2020-06-09 VITALS — BP 142/66 | HR 68 | Temp 98.3°F | Resp 16 | Ht 67.0 in | Wt 185.0 lb

## 2020-06-09 DIAGNOSIS — T502X5A Adverse effect of carbonic-anhydrase inhibitors, benzothiadiazides and other diuretics, initial encounter: Secondary | ICD-10-CM

## 2020-06-09 DIAGNOSIS — E876 Hypokalemia: Secondary | ICD-10-CM | POA: Diagnosis not present

## 2020-06-09 DIAGNOSIS — E119 Type 2 diabetes mellitus without complications: Secondary | ICD-10-CM

## 2020-06-09 DIAGNOSIS — R600 Localized edema: Secondary | ICD-10-CM

## 2020-06-09 DIAGNOSIS — N1831 Chronic kidney disease, stage 3a: Secondary | ICD-10-CM

## 2020-06-09 DIAGNOSIS — I10 Essential (primary) hypertension: Secondary | ICD-10-CM

## 2020-06-09 LAB — BASIC METABOLIC PANEL
BUN: 16 mg/dL (ref 6–23)
CO2: 27 mEq/L (ref 19–32)
Calcium: 9.6 mg/dL (ref 8.4–10.5)
Chloride: 106 mEq/L (ref 96–112)
Creatinine, Ser: 1.05 mg/dL (ref 0.40–1.20)
GFR: 54.37 mL/min — ABNORMAL LOW (ref >60.00)
Glucose, Bld: 109 mg/dL — ABNORMAL HIGH (ref 70–99)
Potassium: 3.6 mEq/L (ref 3.5–5.1)
Sodium: 142 mEq/L (ref 135–145)

## 2020-06-09 MED ORDER — METFORMIN HCL 500 MG PO TABS: 500.0000 mg | ORAL_TABLET | Freq: Two times a day (BID) | ORAL | 1 refills | Status: DC

## 2020-06-09 MED ORDER — TORSEMIDE 10 MG PO TABS: 10.0000 mg | ORAL_TABLET | Freq: Every day | ORAL | 1 refills | Status: DC

## 2020-06-09 MED ORDER — ONETOUCH VERIO VI SOLN: 1.0000 | Freq: Two times a day (BID) | 5 refills | Status: AC

## 2020-06-09 MED ORDER — HYDRALAZINE HCL 50 MG PO TABS: 50.0000 mg | ORAL_TABLET | Freq: Two times a day (BID) | ORAL | 1 refills | Status: DC

## 2020-06-09 NOTE — Progress Notes (Signed)
Subjective:  Patient ID: Ashley Woodard, female    DOB: 04-26-1950  Age: 70 y.o. MRN: [id number]  CC: Hypertension  This visit occurred during the SARS-CoV-2 public health emergency.  Safety protocols were in place, including screening questions prior to the visit, additional usage of staff PPE, and extensive cleaning of exam room while observing appropriate contact time as indicated for disinfecting solutions.    HPI Ashley Woodard presents for f/up - She complains of painless swelling around her ankles at the end of her day. She denies CP, DOE, palpitations , or fatigue.  She is no longer taking spironolactone.  Outpatient Medications Prior to Visit  Medication Sig Dispense Refill  . atorvastatin (LIPITOR) 10 MG tablet Take 1 tablet (10 mg total) by mouth daily. 90 tablet 1  . Blood Glucose Monitoring Suppl (ONETOUCH VERIO FLEX SYSTEM) w/Device KIT 1 Act by Does not apply route in the morning and at bedtime. 1 kit 3  . doxylamine, Sleep, (UNISOM) 25 MG tablet Take 25 mg by mouth at bedtime.    . DULoxetine (CYMBALTA) 30 MG capsule Take 1 capsule (30 mg total) by mouth daily. 90 capsule 1  . glucose blood (ONETOUCH VERIO) test strip 1 each by Other route in the morning and at bedtime. Use as instructed 100 each 12  . irbesartan (AVAPRO) 150 MG tablet Take 1 tablet (150 mg total) by mouth daily. 90 tablet 1  . Lancets (ONETOUCH ULTRASOFT) lancets 1 each by Other route in the morning and at bedtime. Use as instructed 100 each 5  . levothyroxine (SYNTHROID) 88 MCG tablet Take 1 tablet (88 mcg total) by mouth daily. 90 tablet 1  . Magnesium 200 MG TABS Take 200 mg by mouth daily.     Marland Kitchen amLODipine (NORVASC) 10 MG tablet Take 1 tablet (10 mg total) by mouth daily. 90 tablet 1  . Blood Glucose Calibration (ONETOUCH VERIO) SOLN 1 Act by In Vitro route in the morning and at bedtime. 1 each 5  . hydrALAZINE (APRESOLINE) 50 MG tablet Take 1 tablet (50 mg total) by mouth 2 (two) times daily. 180 tablet 1   . metFORMIN (GLUCOPHAGE) 500 MG tablet Take 1 tablet (500 mg total) by mouth 2 (two) times daily with a meal. 180 tablet 1   No facility-administered medications prior to visit.    ROS Review of Systems  Constitutional: Negative for diaphoresis, fatigue and unexpected weight change.  HENT: Negative.   Eyes: Negative for visual disturbance.  Respiratory: Negative for cough, chest tightness, shortness of breath and wheezing.   Cardiovascular: Positive for leg swelling. Negative for chest pain and palpitations.  Gastrointestinal: Negative for abdominal pain, constipation, diarrhea, nausea and vomiting.  Endocrine: Negative.   Genitourinary: Negative.  Negative for difficulty urinating.  Musculoskeletal: Negative.  Negative for arthralgias and myalgias.  Skin: Negative.  Negative for color change.  Neurological: Negative.  Negative for dizziness, weakness, light-headedness and numbness.  Hematological: Negative for adenopathy. Does not bruise/bleed easily.  Psychiatric/Behavioral: Negative.     Objective:  BP (!) 142/66 (BP Location: Right Arm, Patient Position: Sitting, Cuff Size: Large)   Pulse 68   Temp 98.3 F (36.8 C) (Oral)   Resp 16   Ht '5\' 7"'  (1.702 m)   Wt 185 lb (83.9 kg)   SpO2 97%   BMI 28.98 kg/m   BP Readings from Last 3 Encounters:  06/09/20 (!) 142/66  05/12/20 140/88  03/26/20 (!) 158/84    Wt Readings from Last 3  Encounters:  06/09/20 185 lb (83.9 kg)  05/12/20 187 lb (84.8 kg)  03/26/20 196 lb (88.9 kg)    Physical Exam Vitals reviewed.  Constitutional:      Appearance: Normal appearance.  HENT:     Nose: Nose normal.  Eyes:     General: No scleral icterus.    Conjunctiva/sclera: Conjunctivae normal.  Cardiovascular:     Rate and Rhythm: Normal rate and regular rhythm.     Heart sounds: No murmur heard.   Pulmonary:     Effort: Pulmonary effort is normal.     Breath sounds: No stridor. No wheezing, rhonchi or rales.  Abdominal:      General: Abdomen is flat. Bowel sounds are normal. There is no distension.     Palpations: Abdomen is soft. There is no hepatomegaly, splenomegaly or mass.     Tenderness: There is no abdominal tenderness.  Musculoskeletal:        General: Normal range of motion.     Cervical back: Neck supple.     Right lower leg: Edema present.     Left lower leg: Edema present.     Comments: Trace BLE non-pitting edema  Lymphadenopathy:     Cervical: No cervical adenopathy.  Skin:    General: Skin is warm and dry.     Coloration: Skin is not pale.     Findings: No rash.  Neurological:     General: No focal deficit present.     Mental Status: She is alert.  Psychiatric:        Mood and Affect: Mood normal.        Behavior: Behavior normal.     Lab Results  Component Value Date   WBC 8.0 07/03/2019   HGB 12.8 07/03/2019   HCT 38.2 07/03/2019   PLT 328.0 07/03/2019   GLUCOSE 109 (H) 06/09/2020   CHOL 114 03/26/2020   TRIG 154.0 (H) 03/26/2020   HDL 41.50 03/26/2020   LDLDIRECT 50.0 07/03/2019   LDLCALC 41 03/26/2020   ALT 14 07/03/2019   AST 11 07/03/2019   NA 142 06/09/2020   K 3.6 06/09/2020   CL 106 06/09/2020   CREATININE 1.05 06/09/2020   BUN 16 06/09/2020   CO2 27 06/09/2020   TSH 1.57 03/26/2020   INR 1.7 (A) 03/31/2018   HGBA1C 6.6 (H) 03/26/2020   MICROALBUR <0.7 03/26/2020    No results found.  Assessment & Plan:   Ashley Woodard was seen today for hypertension.  Diagnoses and all orders for this visit:  Stage 3a chronic kidney disease ([place])- Her renal function has improved. -     Basic metabolic panel; Future -     Basic metabolic panel  Primary hypertension- Her systolic blood pressure is not adequately well controlled.  Will add a loop diuretic. -     Basic metabolic panel; Future -     hydrALAZINE (APRESOLINE) 50 MG tablet; Take 1 tablet (50 mg total) by mouth 2 (two) times daily. -     torsemide (DEMADEX) 10 MG tablet; Take 1 tablet (10 mg total) by mouth  daily. -     Basic metabolic panel  Diuretic-induced hypokalemia- Her potassium level is normal now. -     Basic metabolic panel; Future -     Basic metabolic panel  Type 2 diabetes mellitus without complication, without long-term current use of insulin ([facility]) -     metFORMIN (GLUCOPHAGE) 500 MG tablet; Take 1 tablet (500 mg total) by mouth 2 (two) times  daily with a meal. -     Blood Glucose Calibration (ONETOUCH VERIO) SOLN; 1 Act by In Vitro route in the morning and at bedtime.  Bilateral leg edema-I think this is caused by amlodipine so I have asked her to stop taking it.  I have also recommended that she start taking a loop diuretic. -     torsemide (DEMADEX) 10 MG tablet; Take 1 tablet (10 mg total) by mouth daily.   I have discontinued Ashley Woodard's amLODipine. I am also having her start on torsemide. Additionally, I am having her maintain her Magnesium, doxylamine (Sleep), OneTouch Verio Flex System, onetouch ultrasoft, OneTouch Verio, levothyroxine, atorvastatin, DULoxetine, irbesartan, hydrALAZINE, metFORMIN, and OneTouch Verio.  Meds ordered this encounter  Medications  . hydrALAZINE (APRESOLINE) 50 MG tablet    Sig: Take 1 tablet (50 mg total) by mouth 2 (two) times daily.    Dispense:  180 tablet    Refill:  1  . metFORMIN (GLUCOPHAGE) 500 MG tablet    Sig: Take 1 tablet (500 mg total) by mouth 2 (two) times daily with a meal.    Dispense:  180 tablet    Refill:  1  . Blood Glucose Calibration (ONETOUCH VERIO) SOLN    Sig: 1 Act by In Vitro route in the morning and at bedtime.    Dispense:  1 each    Refill:  5  . torsemide (DEMADEX) 10 MG tablet    Sig: Take 1 tablet (10 mg total) by mouth daily.    Dispense:  90 tablet    Refill:  1     Follow-up: Return in about 6 months (around 12/09/2020).  Scarlette Calico, MD

## 2020-06-09 NOTE — Patient Instructions (Signed)

## 2020-06-10 ENCOUNTER — Other Ambulatory Visit: Payer: Self-pay | Admitting: Internal Medicine

## 2020-06-10 DIAGNOSIS — E2839 Other primary ovarian failure: Secondary | ICD-10-CM

## 2020-06-19 ENCOUNTER — Other Ambulatory Visit: Payer: Self-pay | Admitting: Internal Medicine

## 2020-06-19 DIAGNOSIS — E2839 Other primary ovarian failure: Secondary | ICD-10-CM

## 2020-06-23 ENCOUNTER — Ambulatory Visit (INDEPENDENT_AMBULATORY_CARE_PROVIDER_SITE_OTHER): Payer: Medicare Other

## 2020-06-23 DIAGNOSIS — I480 Paroxysmal atrial fibrillation: Secondary | ICD-10-CM

## 2020-06-24 LAB — CUP PACEART REMOTE DEVICE CHECK
Date Time Interrogation Session: 20220430031414
Implantable Pulse Generator Implant Date: 20191008

## 2020-07-11 NOTE — Progress Notes (Signed)
Carelink Summary Report / Loop Recorder 

## 2020-07-24 ENCOUNTER — Institutional Professional Consult (permissible substitution): Payer: Medicare Other | Admitting: Neurology

## 2020-07-27 LAB — CUP PACEART REMOTE DEVICE CHECK
Date Time Interrogation Session: 20220602031547
Implantable Pulse Generator Implant Date: 20191008

## 2020-07-28 ENCOUNTER — Ambulatory Visit (INDEPENDENT_AMBULATORY_CARE_PROVIDER_SITE_OTHER): Payer: Medicare Other

## 2020-07-28 DIAGNOSIS — I480 Paroxysmal atrial fibrillation: Secondary | ICD-10-CM

## 2020-08-19 ENCOUNTER — Other Ambulatory Visit: Payer: Self-pay | Admitting: Internal Medicine

## 2020-08-19 DIAGNOSIS — Z1231 Encounter for screening mammogram for malignant neoplasm of breast: Secondary | ICD-10-CM

## 2020-08-19 NOTE — Progress Notes (Signed)
Carelink Summary Report / Loop Recorder 

## 2020-08-26 ENCOUNTER — Other Ambulatory Visit: Payer: Self-pay

## 2020-08-26 ENCOUNTER — Encounter: Payer: Self-pay | Admitting: Neurology

## 2020-08-26 ENCOUNTER — Telehealth: Payer: Self-pay | Admitting: Neurology

## 2020-08-26 ENCOUNTER — Ambulatory Visit (INDEPENDENT_AMBULATORY_CARE_PROVIDER_SITE_OTHER): Payer: Medicare Other | Admitting: Neurology

## 2020-08-26 VITALS — BP 155/71 | HR 67 | Wt 185.0 lb

## 2020-08-26 DIAGNOSIS — J383 Other diseases of vocal cords: Secondary | ICD-10-CM | POA: Diagnosis not present

## 2020-08-26 LAB — CUP PACEART REMOTE DEVICE CHECK
Date Time Interrogation Session: 20220705031648
Implantable Pulse Generator Implant Date: 20191008

## 2020-08-26 NOTE — Patient Instructions (Addendum)
Dr. Karren Burly D. Jenne Pane, MD  [facility]  Address: [address], [place], [place] [address] Phone: [phone]

## 2020-08-26 NOTE — Progress Notes (Signed)
Chief Complaint  Patient presents with   Follow-up    States she is doing better since she retired in march 2022, she is not using her voice as much Reports multiple spasms.       ASSESSMENT AND PLAN  ARTHELIA CALLICOTT is a 70 y.o. female   Spastic dysphonia  Received low-dose Botox injection by Dr. Redmond Baseman in February 2019, responding well,  Plans to resume injections again, she also complains of frequent postnasal drainage  Will refer her back to Dr. Redmond Baseman, and to coordinate her EMG guided Botox injection through our office   DIAGNOSTIC DATA (LABS, IMAGING, TESTING) - I reviewed patient records, labs, notes, testing and imaging myself where available.  CT cardiac in March 2019 1. Coronary calcium score 0 Agatston units, suggesting low risk for future cardiac events.   2. No significant obstructive coronary disease noted, though study somewhat limited by motion artifact.   3.  No LA appendage thrombus seen.   HISTORICAL: DAZIYA REDMOND is a 70 year old female, seen in refer by ENT physician Dr. Jaclyn Prime coordinator injection through our office [place] for evaluation of dysphonia, initial evaluation was on March 08, 2017. Her PCP is Dr. 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   She denies dysarthria, no dysphagia, she reported family history of tremor, her maternal aunt suffered essential tremor, maternal uncle has severe essential tremor, spastic dysphonia, required deep brain stimulator, her 57 year old son suffered  bilateral hand tremor   For laparoscopic laryngoscope vocal fold without mass, scarring, or ulceration, the vocal folds adduct and abduct to symmetrically, but there is good glottal closure, there is rhythmic spasm of the vocal fold with phonation in adduction pattern, she was recommended laryngeal Botox treatment   UPDATE Apr 18 2017: This is her first EMG guided injection for spastic dysphonia  Under EMG guidance, BOTOX A was injected into bilateral thyroarytenoid muscles, 2.0 Units at each side, by Dr. Redmond Baseman. Laboratory evaluation April 2022: BMP showed slight elevated glucose 109, normal TSH 1.57, A1c 6.6, lipid panel, LDL 41, triglyceride 154  UPDATE July 5th 2022: She reported significant improvement following her first and only EMG guided Botox injection by Dr. Redmond Baseman on April 18, 2017, however due to her work-related schedule and other medical issues, she lost to follow-up, she retired now, wants to continue Botox treatment, she continue complains of frequent postnasal drainage, strained voice with prolonged talking  For posterior nasal drainage, she complains of frequent itching, scratching sensation, tried different allergy medicine, nasal spray, Neti pot, without helping her symptoms, she was also treated for the GERD in the past without helping her symptoms.    PHYSICAL EXAM:   Vitals:   08/26/20 1101  BP: (!) 155/71  Pulse: 67  Weight: 185 lb (83.9 kg)   Not recorded     Body mass index is 28.98 kg/m.  PHYSICAL EXAMNIATION:  Gen: NAD, conversant, well nourised, well groomed  Cardiovascular: Regular rate rhythm, no peripheral edema, warm, nontender. Eyes: Conjunctivae clear without exudates or hemorrhage Neck: Supple, no carotid bruits. Pulmonary: Clear to auscultation bilaterally   NEUROLOGICAL EXAM:  MENTAL STATUS: Speech:    Only mild dysphonia noted Cognition:     Orientation to time, place and person     Normal recent and remote  memory     Normal Attention span and concentration     Normal Language, naming, repeating,spontaneous speech     Fund of knowledge   CRANIAL NERVES: CN II: Visual fields are full to confrontation. Pupils are round equal and briskly reactive to light. CN III, IV, VI: extraocular movement are normal. No ptosis. CN V: Facial sensation is intact to light touch CN VII: Face is symmetric with normal eye closure  CN VIII: Hearing is normal to causal conversation. CN IX, X: Phonation is normal. CN XI: Head turning and shoulder shrug are intact  MOTOR: There is no pronator drift of out-stretched arms. Muscle bulk and tone are normal. Muscle strength is normal.  REFLEXES: Reflexes are 2+ and symmetric at the biceps, triceps, knees, and ankles. Plantar responses are flexor.  SENSORY: Intact to light touch, pinprick and vibratory sensation are intact in fingers and toes.  COORDINATION: There is no trunk or limb dysmetria noted.  GAIT/STANCE: Posture is normal. Gait is steady with normal steps, base, arm swing, and turning. Heel and toe walking are normal. Tandem gait is normal.  Romberg is absent.  REVIEW OF SYSTEMS:  Full 14 system review of systems performed and notable only for as above All other review of systems were negative.   ALLERGIES: Allergies  Allergen Reactions   Amlodipine Swelling    Ankle edema   Demeclocycline Rash   Doxycycline Rash   Erythromycin Rash   Lisinopril Cough   Tetracyclines & Related Rash    HOME MEDICATIONS: Current Outpatient Medications  Medication Sig Dispense Refill   amLODipine (NORVASC) 10 MG tablet Take 10 mg by mouth daily.     atorvastatin (LIPITOR) 10 MG tablet Take 1 tablet (10 mg total) by mouth daily. 90 tablet 1   Blood Glucose Calibration (ONETOUCH VERIO) SOLN 1 Act by In Vitro route in the morning and at bedtime. 1 each 5   Blood Glucose Monitoring Suppl (ONETOUCH VERIO FLEX SYSTEM) w/Device KIT 1 Act by Does not apply route in  the morning and at bedtime. 1 kit 3   doxylamine, Sleep, (UNISOM) 25 MG tablet Take 25 mg by mouth at bedtime.     DULoxetine (CYMBALTA) 30 MG capsule Take 1 capsule (30 mg total) by mouth daily. 90 capsule 1   glucose blood (ONETOUCH VERIO) test strip 1 each by Other route in the morning and at bedtime. Use as instructed 100 each 12   hydrALAZINE (APRESOLINE) 50 MG tablet Take 1 tablet (50 mg total) by mouth 2 (two) times daily. 180 tablet 1   irbesartan (AVAPRO) 150 MG tablet Take 1 tablet (150 mg total) by mouth daily. 90 tablet 1   Lancets (ONETOUCH ULTRASOFT) lancets 1 each by Other route in the morning and at bedtime. Use as instructed 100 each 5   levothyroxine (SYNTHROID) 88 MCG tablet Take 1 tablet (88 mcg total) by mouth daily. 90 tablet 1   Magnesium 200 MG TABS Take 200 mg by mouth daily.      metFORMIN (GLUCOPHAGE) 500 MG tablet Take 1 tablet (500 mg total) by mouth 2 (two) times daily with a meal. 180 tablet 1  torsemide (DEMADEX) 10 MG tablet Take 1 tablet (10 mg total) by mouth daily. 90 tablet 1   No current facility-administered medications for this visit.    PAST MEDICAL HISTORY: Past Medical History:  Diagnosis Date   Abnormal cardiovascular stress test 08/21/2016   a. done for abnl EKG/cardiac risk factors -> admitted for cath 07/2016 which showed no significant CAD, normal LV contraction, mildly elevated filling pressure. Low dose Imdur was added for possible component of microvascular dysfunction.    Allergy    Anxiety    Arthritis    "back, knees, fingers" (05/10/2017)   Chronic lower back pain    Colon polyps    Deafness in right ear    Depression    Diverticulosis    Hx of adenomatous colonic polyps 05/08/2003   Hypertension    Hypertriglyceridemia    Hypothyroidism    Migraine    "none in the 2000s" (05/10/2017)   Osteoporosis    Paroxysmal atrial fibrillation (HCC)    Pneumonia    "several times" (05/10/2017)   PONV (postoperative nausea and vomiting)     S/P cardiac catheterization, 08/20/16 minimal CAD 08/21/2016   Type II diabetes mellitus ([place])    Voice tremor     PAST SURGICAL HISTORY: Past Surgical History:  Procedure Laterality Date   ABDOMINAL HYSTERECTOMY  1988   ATRIAL FIBRILLATION ABLATION N/A 05/10/2017   Procedure: ATRIAL FIBRILLATION ABLATION;  Surgeon: Thompson Grayer, MD;  Location: [facility];  Service: Cardiovascular;  Laterality: N/A;   COCHLEAR IMPLANT Right 2008   COLONOSCOPY     LEFT HEART CATH AND CORONARY ANGIOGRAPHY N/A 08/20/2016   Procedure: Left Heart Cath and Coronary Angiography;  Surgeon: Nelva Bush, MD;  Location: [facility];  Service: Cardiovascular;  Laterality: N/A;   LOOP RECORDER INSERTION N/A 11/29/2017   Procedure: LOOP RECORDER INSERTION;  Surgeon: Thompson Grayer, MD;  Location: [facility];  Service: Cardiovascular;  Laterality: N/A;   ORIF ANKLE FRACTURE Right 02/06/2019   Procedure: OPEN REDUCTION INTERNAL FIXATION (ORIF) ANKLE FRACTURE LATERAL MALLEOLUS, SYNDESMOSIS, RIGHT;  Surgeon: Erle Crocker, MD;  Location: [facility];  Service: Orthopedics;  Laterality: Right;  SURGERY REQUEST TIME: 1.5 HOURS  CPT CODES: 29937, 27829, [phone], 27610    FAMILY HISTORY: Family History  Problem Relation Age of Onset   Hypertension Mother    Diabetes Mother    Heart attack Mother    Colon polyps Mother    Hypertension Father    Heart attack Father    Stroke Father    Breast cancer Sister 21   Hypertension Brother    Cancer Brother        Prostate   Diabetes Brother    Dementia Brother    Stroke Brother    Heart attack Brother    Prostate cancer Brother    Hypertension Son    Breast cancer Maternal Aunt        in 30's   Cancer Maternal Uncle        Lung   Colon cancer Neg Hx    Esophageal cancer Neg Hx    Rectal cancer Neg Hx    Stomach cancer Neg Hx     SOCIAL HISTORY: Social History   Socioeconomic History   Marital status: Married     Spouse name: Not on file   Number of children: 2   Years of education: 12   Highest education level: Not on file  Occupational History  Occupation: Health and safety inspector  Tobacco Use   Smoking status: Never   Smokeless tobacco: Never  Vaping Use   Vaping Use: Never used  Substance and Sexual Activity   Alcohol use: No   Drug use: No   Sexual activity: Not Currently  Other Topics Concern   Not on file  Social History Narrative   Lives at home with husband in [place].   Right-handed.   No caffeine use.   Manages a call center   Social Determinants of Health   Financial Resource Strain: Low Risk    Difficulty of Paying Living Expenses: Not hard at all  Food Insecurity: No Food Insecurity   Worried About Charity fundraiser in the Last Year: Never true   Arboriculturist in the Last Year: Never true  Transportation Needs: No Transportation Needs   Lack of Transportation (Medical): No   Lack of Transportation (Non-Medical): No  Physical Activity: Inactive   Days of Exercise per Week: 0 days   Minutes of Exercise per Session: 0 min  Stress: No Stress Concern Present   Feeling of Stress : Not at all  Social Connections: Moderately Isolated   Frequency of Communication with Friends and Family: More than three times a week   Frequency of Social Gatherings with Friends and Family: More than three times a week   Attends Religious Services: Never   Marine scientist or Organizations: No   Attends Archivist Meetings: Never   Marital Status: Married  Human resources officer Violence: Not At Risk   Fear of Current or Ex-Partner: No   Emotionally Abused: No   Physically Abused: No   Sexually Abused: No      Marcial Pacas, M.D. Ph.D.  [facility] [address], [place], [place] [address] Ph: [phone] Fax: [phone]  CC:  Janith Lima, MD [place],  [place] [id number]  Janith Lima, MD

## 2020-08-26 NOTE — Telephone Encounter (Signed)
Faxed referral to [facility] for Dr. Jenne Pane. Phone: [phone]. Fax: [phone].

## 2020-08-27 DIAGNOSIS — R0989 Other specified symptoms and signs involving the circulatory and respiratory systems: Secondary | ICD-10-CM | POA: Diagnosis not present

## 2020-08-27 DIAGNOSIS — K219 Gastro-esophageal reflux disease without esophagitis: Secondary | ICD-10-CM | POA: Diagnosis not present

## 2020-08-27 DIAGNOSIS — H8101 Meniere's disease, right ear: Secondary | ICD-10-CM | POA: Diagnosis not present

## 2020-08-27 DIAGNOSIS — J383 Other diseases of vocal cords: Secondary | ICD-10-CM | POA: Diagnosis not present

## 2020-08-27 DIAGNOSIS — H9312 Tinnitus, left ear: Secondary | ICD-10-CM | POA: Diagnosis not present

## 2020-08-31 IMAGING — MG DIGITAL SCREENING BILATERAL MAMMOGRAM WITH TOMO AND CAD
8 series · 8 of 24 positions shown · non-contrast
Comparison: Previous exam(s).

CLINICAL DATA: Screening.

EXAM:
DIGITAL SCREENING BILATERAL MAMMOGRAM WITH TOMO AND CAD

[L CC synth-2D]
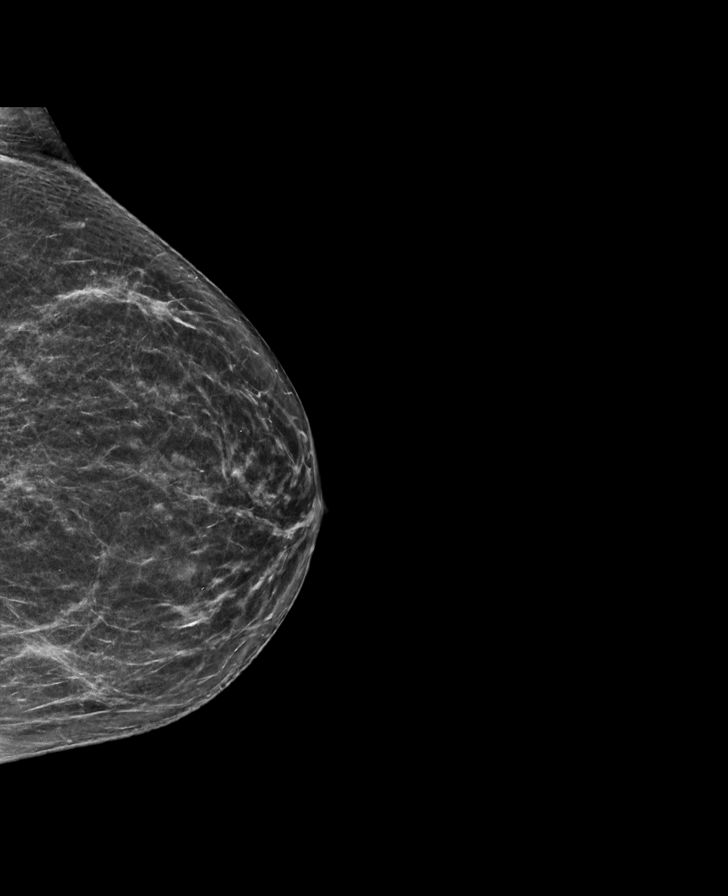

[R CC synth-2D]
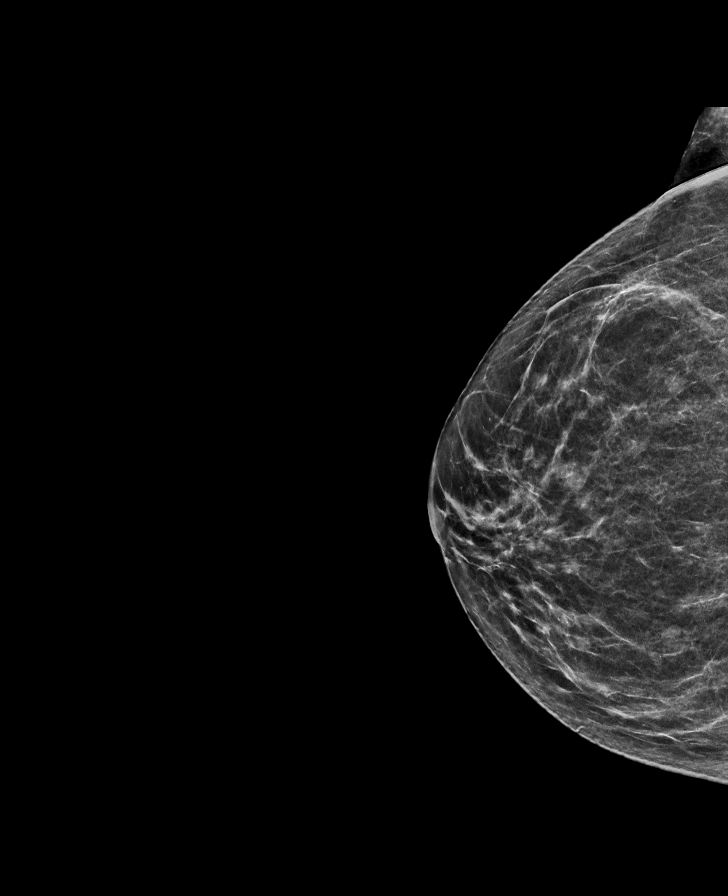

[R MLO synth-2D]
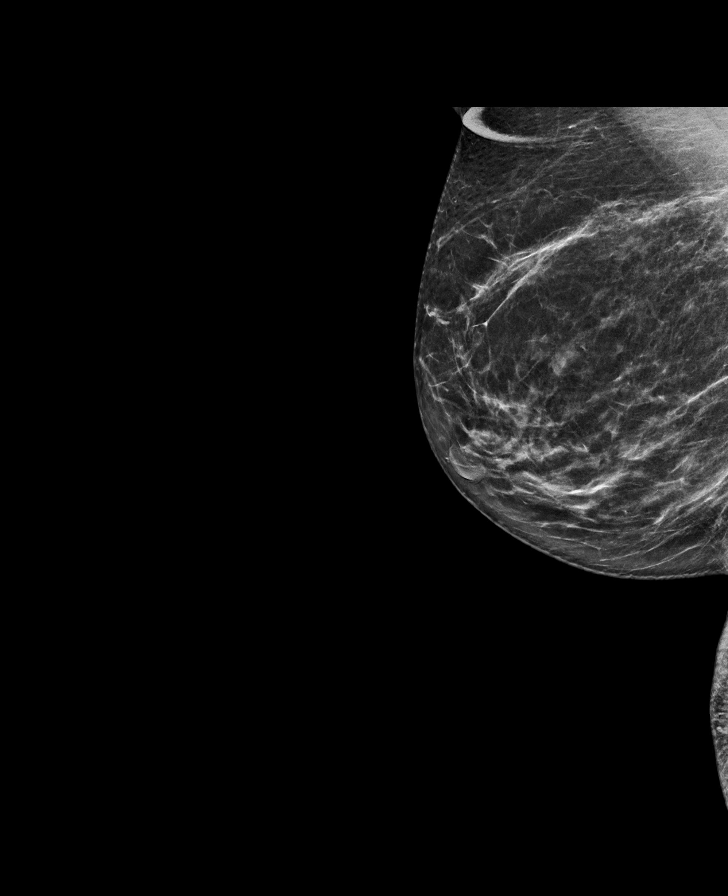

[L MLO synth-2D]
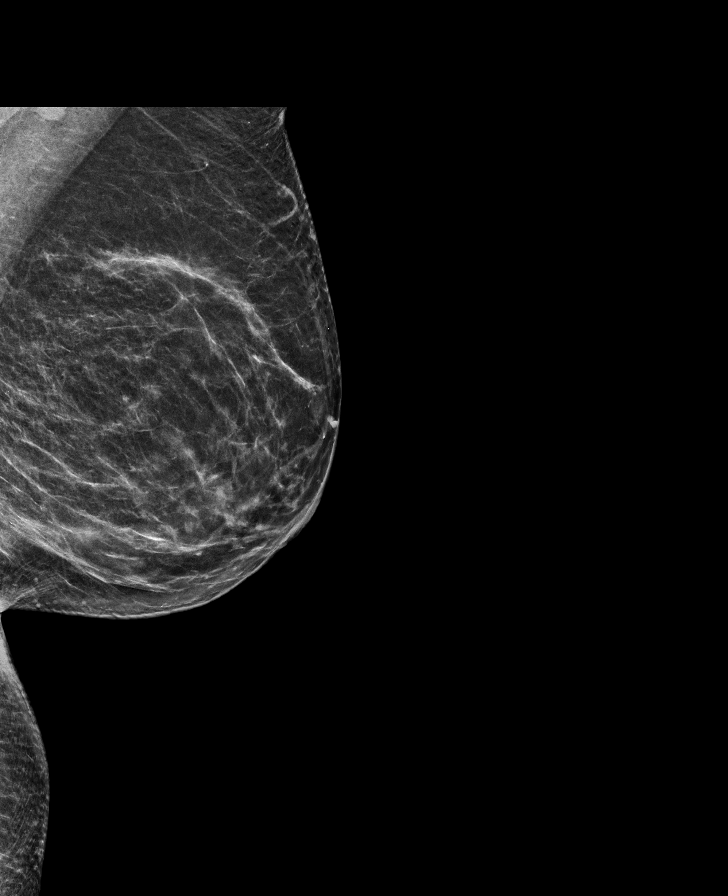

[R CC tomo · tomo slice 31/60.0]
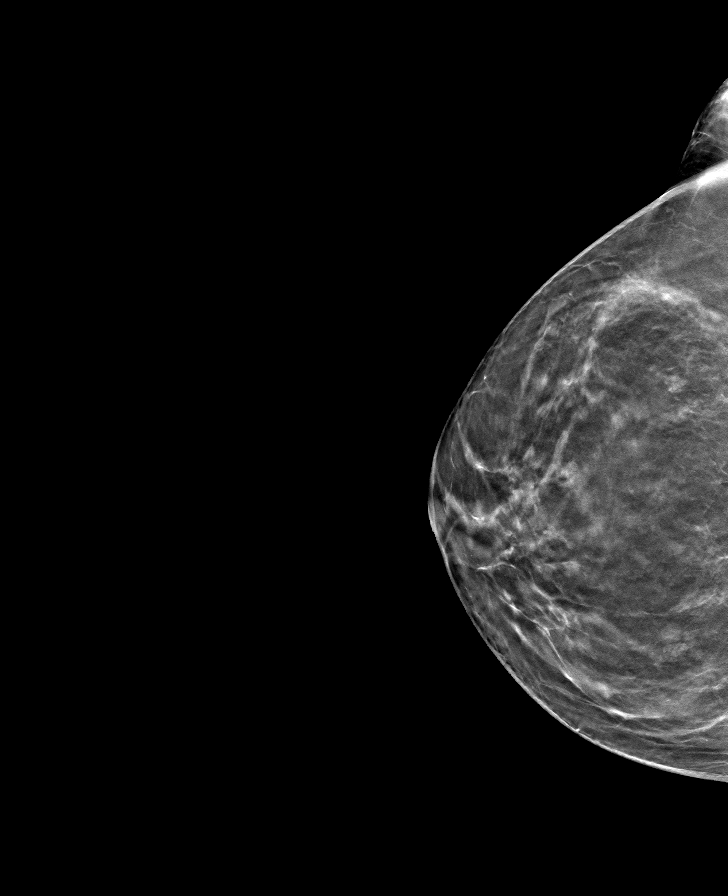

[R MLO tomo · tomo slice 35/69.0]
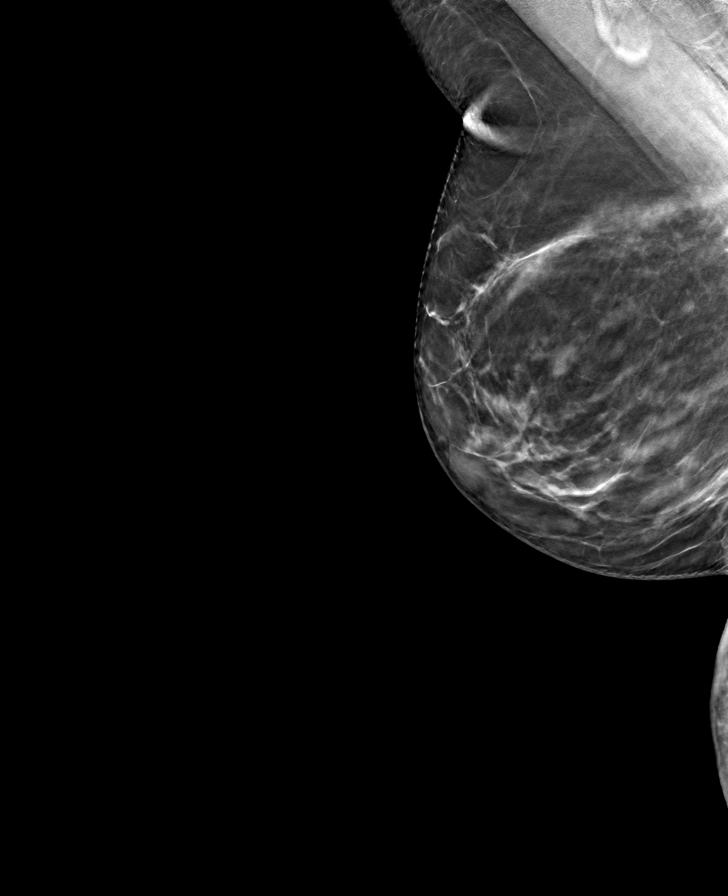

[L MLO tomo · tomo slice 33/66.0]
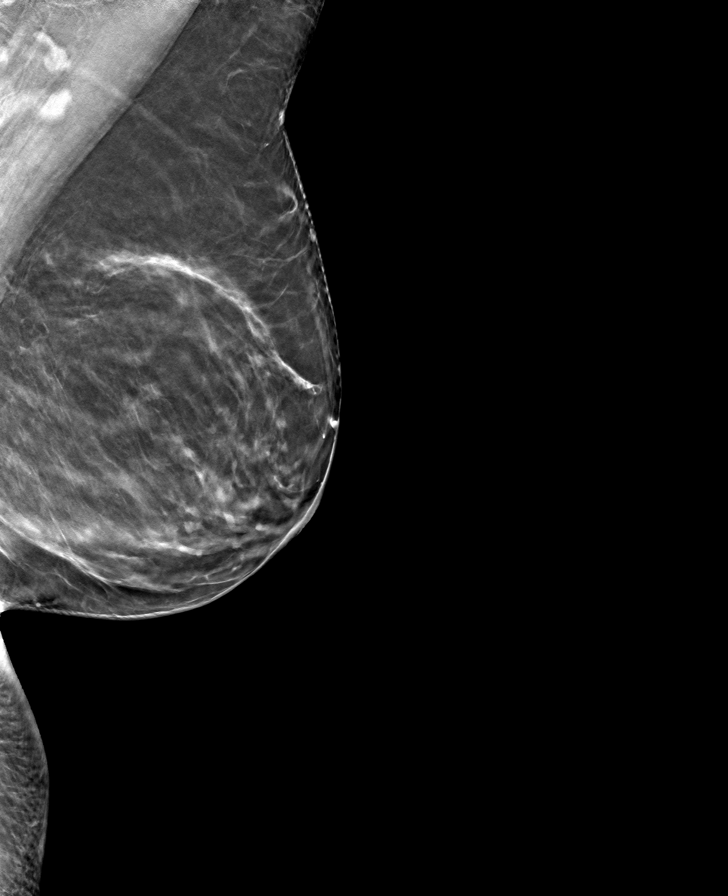

[L CC tomo · tomo slice 31/62.0]
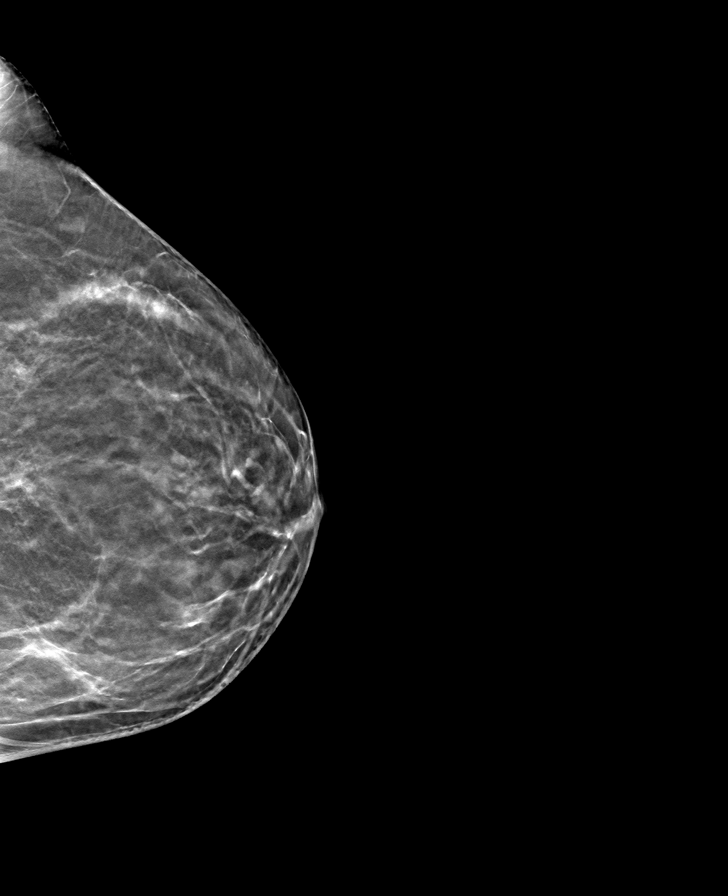

[8 of 24 positions shown; findings below may reference images not displayed]

ACR Breast Density Category b: There are scattered areas of
fibroglandular density.
FINDINGS: There are no findings suspicious for malignancy. Images were
processed with CAD.
IMPRESSION: No mammographic evidence of malignancy. A result letter of this
screening mammogram will be mailed directly to the patient.

RECOMMENDATION:
Screening mammogram in one year. (Code:[id number])

BI-RADS CATEGORY  1: Negative.

## 2020-09-01 ENCOUNTER — Ambulatory Visit (INDEPENDENT_AMBULATORY_CARE_PROVIDER_SITE_OTHER): Payer: Medicare Other

## 2020-09-01 DIAGNOSIS — I480 Paroxysmal atrial fibrillation: Secondary | ICD-10-CM | POA: Diagnosis not present

## 2020-09-11 NOTE — Telephone Encounter (Signed)
Received call from [facility] at Dr. Jenne Pane' office, wanting to coordinate a Botox injection for patient by Dr. Terrace Arabia and Dr. Jenne Pane. Misty Stanley states patient has had injections in the past with Dr. Terrace Arabia.

## 2020-09-11 NOTE — Telephone Encounter (Signed)
Please help to make it happen, I have finished to BOTOX paper work

## 2020-09-15 NOTE — Telephone Encounter (Signed)
Patient is scheduled with [place] 8/25.

## 2020-09-16 NOTE — Telephone Encounter (Signed)
Charge sheet received for 100 units of Botox (R4935), per Dr. Terrace Arabia we will inject 50 at her first appointment. This is for Dysphonia (R49.0). CPT [id number], [id number]. Patient has Medicare A & B, no auth required.

## 2020-09-25 NOTE — Progress Notes (Signed)
Carelink Summary Report / Loop Recorder 

## 2020-10-02 ENCOUNTER — Encounter: Payer: Self-pay | Admitting: Internal Medicine

## 2020-10-02 ENCOUNTER — Other Ambulatory Visit: Payer: Self-pay | Admitting: Internal Medicine

## 2020-10-02 DIAGNOSIS — E119 Type 2 diabetes mellitus without complications: Secondary | ICD-10-CM

## 2020-10-02 DIAGNOSIS — I10 Essential (primary) hypertension: Secondary | ICD-10-CM

## 2020-10-02 MED ORDER — IRBESARTAN 150 MG PO TABS: 150.0000 mg | ORAL_TABLET | Freq: Every day | ORAL | 1 refills | Status: DC

## 2020-10-06 ENCOUNTER — Ambulatory Visit (INDEPENDENT_AMBULATORY_CARE_PROVIDER_SITE_OTHER): Payer: Medicare Other

## 2020-10-06 DIAGNOSIS — I480 Paroxysmal atrial fibrillation: Secondary | ICD-10-CM | POA: Diagnosis not present

## 2020-10-08 LAB — CUP PACEART REMOTE DEVICE CHECK
Date Time Interrogation Session: 20220814232819
Implantable Pulse Generator Implant Date: 20191008

## 2020-10-14 DIAGNOSIS — H25013 Cortical age-related cataract, bilateral: Secondary | ICD-10-CM | POA: Diagnosis not present

## 2020-10-14 DIAGNOSIS — E119 Type 2 diabetes mellitus without complications: Secondary | ICD-10-CM | POA: Diagnosis not present

## 2020-10-16 ENCOUNTER — Other Ambulatory Visit: Payer: Self-pay

## 2020-10-16 ENCOUNTER — Ambulatory Visit (INDEPENDENT_AMBULATORY_CARE_PROVIDER_SITE_OTHER): Payer: Medicare Other | Admitting: Neurology

## 2020-10-16 ENCOUNTER — Encounter: Payer: Self-pay | Admitting: Neurology

## 2020-10-16 VITALS — BP 153/75 | HR 67 | Ht 67.0 in | Wt 186.0 lb

## 2020-10-16 DIAGNOSIS — J383 Other diseases of vocal cords: Secondary | ICD-10-CM | POA: Diagnosis not present

## 2020-10-16 DIAGNOSIS — H9041 Sensorineural hearing loss, unilateral, right ear, with unrestricted hearing on the contralateral side: Secondary | ICD-10-CM | POA: Diagnosis not present

## 2020-10-16 DIAGNOSIS — R490 Dysphonia: Secondary | ICD-10-CM

## 2020-10-16 MED ORDER — ONABOTULINUMTOXINA 100 UNITS IJ SOLR
100.0000 [IU] | Freq: Once | INTRAMUSCULAR | Status: AC
  Administered 2020-10-16: 100 [IU] via INTRAMUSCULAR

## 2020-10-16 NOTE — Progress Notes (Signed)
   Chief Complaint  Patient presents with   Procedure    Botox      ASSESSMENT AND PLAN  [name] is a 70 y.o. female   Spastic dysphonia  Received low-dose Botox injection by Dr. Jenne Pane in February 2019, responding well,  Plans to resume injections again, she also complains of frequent postnasal drainage  Will refer her back to Dr. Jenne Pane, and to coordinate her EMG guided Botox injection through our office   DIAGNOSTIC DATA (LABS, IMAGING, TESTING) - I reviewed patient records, labs, notes, testing and imaging myself where available.  CT cardiac in March 2019 1. Coronary calcium score 0 Agatston units, suggesting low risk for future cardiac events.   2. No significant obstructive coronary disease noted, though study somewhat limited by motion artifact.   3.  No LA appendage thrombus seen.   HISTORICAL: Ashley Woodard is a 70 year old female, seen in refer by ENT physician Dr. Rolly Salter coordinator injection through our office [facility] for evaluation of dysphonia, initial evaluation was on March 08, 2017. Her PCP is Dr. Yetta Barre, Bernadene Bell, MD   I reviewed and summarized the referring note, past medical history  diabetes,  hypertension,  hypothyroidism,  osteoporosis,   She used to work as a Production designer, theatre/television/film at a call center, which require  training new employee, giving lectures   She noticed shaky voice with throat tightness since 2016,  the problem has worsened with more talking, the voice is strained, "sound like I am mad",  stress and anxiety aggravate the symptoms, it impact her work life,   never received any treatment in the past.   She denies dysarthria, no dysphagia, she reported family history of tremor, her maternal aunt suffered essential tremor, maternal uncle has severe essential tremor, spastic dysphonia, required deep brain stimulator, her 30 year old son suffered bilateral hand tremor   For laparoscopic laryngoscope vocal fold without mass, scarring, or ulceration, the vocal  folds adduct and abduct to symmetrically, but there is good glottal closure, there is rhythmic spasm of the vocal fold with phonation in adduction pattern, she was recommended laryngeal Botox treatment   UPDATE Apr 18 2017: This is her first EMG guided injection for spastic dysphonia  Under EMG guidance, BOTOX A was injected into bilateral thyroarytenoid muscles, 2.0 Units at each side, by Dr. Jenne Pane. Laboratory evaluation April 2022: BMP showed slight elevated glucose 109, normal TSH 1.57, A1c 6.6, lipid panel, LDL 41, triglyceride 154  UPDATE July 5th 2022: She reported significant improvement following her first and only EMG guided Botox injection by Dr. Jenne Pane on April 18, 2017, however due to her work-related schedule and other medical issues, she lost to follow-up, she retired now, wants to continue Botox treatment, she continue complains of frequent postnasal drainage, strained voice with prolonged talking  For posterior nasal drainage, she complains of frequent itching, scratching sensation, tried different allergy medicine, nasal spray, Neti pot, without helping her symptoms, she was also treated for the GERD in the past without helping her symptoms.    PHYSICAL EXAM:   Vitals:   10/16/20 1101  BP: (!) 153/75  Pulse: 67  Weight: 186 lb (84.4 kg)  Height: 5\' 7"  (1.702 m)   EMG guided thyroarytenoid injection by Dr. for the adductor spastic dysphonia, 2.0 units at each side, patient had mild cough following injection to the right side,

## 2020-10-16 NOTE — Progress Notes (Signed)
**  Botox 100 units x 1 vial, NDC 7262-0355-97, Lot [id number], Exp 10/2021, office supply, injections w/ Dr. Sudie Bailey**

## 2020-10-16 NOTE — Addendum Note (Signed)
Addended by: Levert Feinstein on: 10/16/2020 05:21 PM   Modules accepted: Orders

## 2020-10-17 ENCOUNTER — Other Ambulatory Visit: Payer: Self-pay

## 2020-10-17 ENCOUNTER — Ambulatory Visit
Admission: RE | Admit: 2020-10-17 | Discharge: 2020-10-17 | Disposition: A | Discharge status: Home / Self Care | Payer: Medicare Other | Source: Ambulatory Visit | Attending: Internal Medicine | Admitting: Internal Medicine

## 2020-10-17 DIAGNOSIS — Z1231 Encounter for screening mammogram for malignant neoplasm of breast: Secondary | ICD-10-CM | POA: Diagnosis not present

## 2020-10-17 LAB — HM MAMMOGRAPHY

## 2020-10-23 ENCOUNTER — Other Ambulatory Visit: Payer: Self-pay | Admitting: Internal Medicine

## 2020-10-23 DIAGNOSIS — E785 Hyperlipidemia, unspecified: Secondary | ICD-10-CM

## 2020-10-24 NOTE — Progress Notes (Signed)
Carelink Summary Report / Loop Recorder 

## 2020-11-03 ENCOUNTER — Other Ambulatory Visit: Payer: Self-pay | Admitting: Internal Medicine

## 2020-11-03 DIAGNOSIS — E039 Hypothyroidism, unspecified: Secondary | ICD-10-CM

## 2020-11-10 ENCOUNTER — Ambulatory Visit (INDEPENDENT_AMBULATORY_CARE_PROVIDER_SITE_OTHER): Payer: Medicare Other

## 2020-11-10 DIAGNOSIS — I480 Paroxysmal atrial fibrillation: Secondary | ICD-10-CM | POA: Diagnosis not present

## 2020-11-11 LAB — CUP PACEART REMOTE DEVICE CHECK
Date Time Interrogation Session: 20220916232825
Implantable Pulse Generator Implant Date: 20191008

## 2020-11-14 NOTE — Progress Notes (Signed)
Carelink Summary Report / Loop Recorder 

## 2020-11-19 DIAGNOSIS — H9041 Sensorineural hearing loss, unilateral, right ear, with unrestricted hearing on the contralateral side: Secondary | ICD-10-CM | POA: Diagnosis not present

## 2020-12-03 ENCOUNTER — Other Ambulatory Visit: Payer: Medicare Other

## 2020-12-03 LAB — HM DIABETES EYE EXAM

## 2020-12-04 ENCOUNTER — Other Ambulatory Visit: Payer: Self-pay | Admitting: Internal Medicine

## 2020-12-04 DIAGNOSIS — H2513 Age-related nuclear cataract, bilateral: Secondary | ICD-10-CM | POA: Diagnosis not present

## 2020-12-04 DIAGNOSIS — R600 Localized edema: Secondary | ICD-10-CM

## 2020-12-04 DIAGNOSIS — H25043 Posterior subcapsular polar age-related cataract, bilateral: Secondary | ICD-10-CM | POA: Diagnosis not present

## 2020-12-04 DIAGNOSIS — H25013 Cortical age-related cataract, bilateral: Secondary | ICD-10-CM | POA: Diagnosis not present

## 2020-12-04 DIAGNOSIS — E119 Type 2 diabetes mellitus without complications: Secondary | ICD-10-CM

## 2020-12-04 DIAGNOSIS — I10 Essential (primary) hypertension: Secondary | ICD-10-CM

## 2020-12-04 DIAGNOSIS — H18413 Arcus senilis, bilateral: Secondary | ICD-10-CM | POA: Diagnosis not present

## 2020-12-04 DIAGNOSIS — H2512 Age-related nuclear cataract, left eye: Secondary | ICD-10-CM | POA: Diagnosis not present

## 2020-12-05 ENCOUNTER — Telehealth: Payer: Self-pay | Admitting: *Deleted

## 2020-12-05 NOTE — Telephone Encounter (Signed)
   Pre-operative Risk Assessment    Patient Name: Ashley Woodard  DOB: [birth year] MRN: [id number]      Request for Surgical Clearance   Procedure:   CATARACT EXTRACTION w/INTRAOCULAR LENS IMPLANT OF LEFT EYE 02/04/21; FOLLOWED BY THE RIGHT EYE ON 03/04/21  Date of Surgery: Clearance 02/04/21                                 Surgeon:  DR. Mia Creek Surgeon's Group or Practice Name:  [facility] Phone number:  [phone] Fax number:  [phone]   Type of Clearance Requested: - Medical    Type of Anesthesia:   TOPICAL WITH IV MEDICATION   Additional requests/questions: PER DR. BEVIS NO MEDICATIONS ARE NEEDING TO BE HELD INCLUDING ANY BLOOD THINNERS  Signed, Danielle Rankin   12/05/2020, 9:54 AM

## 2020-12-05 NOTE — Telephone Encounter (Signed)
   Patient Name: Ashley Woodard  DOB: 1950-09-28 MRN: [id number]  Primary Cardiologist: Hillis Range, MD  Chart reviewed as part of pre-operative protocol coverage. Cataract extractions are recognized in guidelines as low risk surgeries that do not typically require specific preoperative testing or holding of blood thinner therapy. Therefore, given past medical history and time since last visit, based on ACC/AHA guidelines, Ashley Woodard would be at acceptable risk for the planned procedure without further cardiovascular testing.   I will route this recommendation to the requesting party via Epic fax function and remove from pre-op pool.  Please call with questions.  Ronney Asters, NP 12/05/2020, 10:09 AM

## 2020-12-08 ENCOUNTER — Ambulatory Visit: Payer: Medicare Other | Admitting: Internal Medicine

## 2020-12-12 ENCOUNTER — Other Ambulatory Visit: Payer: Self-pay | Admitting: Internal Medicine

## 2020-12-14 ENCOUNTER — Other Ambulatory Visit: Payer: Self-pay | Admitting: Internal Medicine

## 2020-12-14 DIAGNOSIS — E039 Hypothyroidism, unspecified: Secondary | ICD-10-CM

## 2020-12-14 DIAGNOSIS — F3342 Major depressive disorder, recurrent, in full remission: Secondary | ICD-10-CM

## 2020-12-15 ENCOUNTER — Ambulatory Visit (INDEPENDENT_AMBULATORY_CARE_PROVIDER_SITE_OTHER): Payer: Medicare Other

## 2020-12-15 DIAGNOSIS — I480 Paroxysmal atrial fibrillation: Secondary | ICD-10-CM

## 2020-12-15 LAB — CUP PACEART REMOTE DEVICE CHECK
Date Time Interrogation Session: 20221019233152
Implantable Pulse Generator Implant Date: 20191008

## 2020-12-17 ENCOUNTER — Ambulatory Visit (INDEPENDENT_AMBULATORY_CARE_PROVIDER_SITE_OTHER): Payer: Medicare Other | Admitting: Internal Medicine

## 2020-12-17 ENCOUNTER — Encounter: Payer: Self-pay | Admitting: Internal Medicine

## 2020-12-17 ENCOUNTER — Other Ambulatory Visit: Payer: Self-pay

## 2020-12-17 VITALS — BP 156/78 | HR 70 | Temp 98.2°F | Resp 16 | Ht 67.0 in | Wt 186.0 lb

## 2020-12-17 DIAGNOSIS — E785 Hyperlipidemia, unspecified: Secondary | ICD-10-CM

## 2020-12-17 DIAGNOSIS — E039 Hypothyroidism, unspecified: Secondary | ICD-10-CM | POA: Diagnosis not present

## 2020-12-17 DIAGNOSIS — I10 Essential (primary) hypertension: Secondary | ICD-10-CM | POA: Diagnosis not present

## 2020-12-17 DIAGNOSIS — E119 Type 2 diabetes mellitus without complications: Secondary | ICD-10-CM

## 2020-12-17 DIAGNOSIS — M791 Myalgia, unspecified site: Secondary | ICD-10-CM | POA: Diagnosis not present

## 2020-12-17 DIAGNOSIS — Z23 Encounter for immunization: Secondary | ICD-10-CM | POA: Insufficient documentation

## 2020-12-17 DIAGNOSIS — N1831 Chronic kidney disease, stage 3a: Secondary | ICD-10-CM

## 2020-12-17 LAB — CBC WITH DIFFERENTIAL/PLATELET
Basophils Absolute: 0.1 10*3/uL (ref 0.0–0.1)
Basophils Relative: 0.9 % (ref 0.0–3.0)
Eosinophils Absolute: 0.3 10*3/uL (ref 0.0–0.7)
Eosinophils Relative: 4.2 % (ref 0.0–5.0)
HCT: 37.9 % (ref 36.0–46.0)
Hemoglobin: 12.6 g/dL (ref 12.0–15.0)
Lymphocytes Relative: 26.3 % (ref 12.0–46.0)
Lymphs Abs: 1.9 10*3/uL (ref 0.7–4.0)
MCHC: 33.4 g/dL (ref 30.0–36.0)
MCV: 84.5 fl (ref 78.0–100.0)
Monocytes Absolute: 0.5 10*3/uL (ref 0.1–1.0)
Monocytes Relative: 6.8 % (ref 3.0–12.0)
Neutro Abs: 4.5 10*3/uL (ref 1.4–7.7)
Neutrophils Relative %: 61.8 % (ref 43.0–77.0)
Platelets: 309 10*3/uL (ref 150.0–400.0)
RBC: 4.48 Mil/uL (ref 3.87–5.11)
RDW: 14.2 % (ref 11.5–15.5)
WBC: 7.3 10*3/uL (ref 4.0–10.5)

## 2020-12-17 LAB — BASIC METABOLIC PANEL
BUN: 18 mg/dL (ref 6–23)
CO2: 28 mEq/L (ref 19–32)
Calcium: 9.7 mg/dL (ref 8.4–10.5)
Chloride: 104 mEq/L (ref 96–112)
Creatinine, Ser: 1.14 mg/dL (ref 0.40–1.20)
GFR: 49.08 mL/min — ABNORMAL LOW (ref >60.00)
Glucose, Bld: 118 mg/dL — ABNORMAL HIGH (ref 70–99)
Potassium: 4 mEq/L (ref 3.5–5.1)
Sodium: 140 mEq/L (ref 135–145)

## 2020-12-17 LAB — URINALYSIS, ROUTINE W REFLEX MICROSCOPIC
Bilirubin Urine: NEGATIVE
Hgb urine dipstick: NEGATIVE
Ketones, ur: NEGATIVE
Leukocytes,Ua: NEGATIVE
Nitrite: NEGATIVE
RBC / HPF: NONE SEEN (ref 0–?)
Specific Gravity, Urine: 1.01 (ref 1.000–1.030)
Total Protein, Urine: NEGATIVE
Urine Glucose: NEGATIVE
Urobilinogen, UA: 0.2 (ref 0.0–1.0)
pH: 6 (ref 5.0–8.0)

## 2020-12-17 LAB — HEPATIC FUNCTION PANEL
ALT: 16 U/L (ref 0–35)
AST: 14 U/L (ref 0–37)
Albumin: 4.4 g/dL (ref 3.5–5.2)
Alkaline Phosphatase: 69 U/L (ref 39–117)
Bilirubin, Direct: 0.1 mg/dL (ref 0.0–0.3)
Total Bilirubin: 0.5 mg/dL (ref 0.2–1.2)
Total Protein: 7 g/dL (ref 6.0–8.3)

## 2020-12-17 LAB — C-REACTIVE PROTEIN: CRP: <1 mg/dL (ref 0.5–20.0)

## 2020-12-17 LAB — HEMOGLOBIN A1C: Hgb A1c MFr Bld: 6.4 % (ref 4.6–6.5)

## 2020-12-17 LAB — TSH: TSH: 2.13 u[IU]/mL (ref 0.35–5.50)

## 2020-12-17 LAB — CK: Total CK: 55 U/L (ref 7–177)

## 2020-12-17 MED ORDER — SHINGRIX 50 MCG/0.5ML IM SUSR: 0.5000 mL | Freq: Once | INTRAMUSCULAR | 1 refills | Status: AC

## 2020-12-17 MED ORDER — DAPAGLIFLOZIN PROPANEDIOL 10 MG PO TABS: 10.0000 mg | ORAL_TABLET | Freq: Every day | ORAL | 1 refills | Status: DC

## 2020-12-17 MED ORDER — HYDRALAZINE HCL 50 MG PO TABS: 50.0000 mg | ORAL_TABLET | Freq: Four times a day (QID) | ORAL | 0 refills | Status: DC

## 2020-12-17 NOTE — Patient Instructions (Signed)

## 2020-12-17 NOTE — Progress Notes (Signed)
Subjective:  Patient ID: Ashley Woodard, female    DOB: 30-Dec-1950  Age: 70 y.o. MRN: [id number]  CC: Hypertension and Hyperlipidemia  This visit occurred during the SARS-CoV-2 public health emergency.  Safety protocols were in place, including screening questions prior to the visit, additional usage of staff PPE, and extensive cleaning of exam room while observing appropriate contact time as indicated for disinfecting solutions.    HPI Favor Kreh Lange presents for f/up -   She complains of diffuse muscle aches since she has been taking the statin.  She is active and denies palpitations, chest pain, edema, or diaphoresis.  Outpatient Medications Prior to Visit  Medication Sig Dispense Refill   amLODipine (NORVASC) 10 MG tablet TAKE 1 TABLET DAILY 90 tablet 0   Blood Glucose Calibration (ONETOUCH VERIO) SOLN 1 Act by In Vitro route in the morning and at bedtime. 1 each 5   Blood Glucose Monitoring Suppl (ONETOUCH VERIO FLEX SYSTEM) w/Device KIT 1 Act by Does not apply route in the morning and at bedtime. 1 kit 3   doxylamine, Sleep, (UNISOM) 25 MG tablet Take 25 mg by mouth at bedtime.     DULoxetine (CYMBALTA) 30 MG capsule TAKE 1 CAPSULE BY MOUTH EVERY DAY 90 capsule 1   glucose blood (ONETOUCH VERIO) test strip 1 each by Other route in the morning and at bedtime. Use as instructed 100 each 12   irbesartan (AVAPRO) 150 MG tablet Take 1 tablet (150 mg total) by mouth daily. 90 tablet 1   Lancets (ONETOUCH ULTRASOFT) lancets 1 each by Other route in the morning and at bedtime. Use as instructed 100 each 5   levothyroxine (SYNTHROID) 88 MCG tablet TAKE 1 TABLET DAILY 90 tablet 0   Magnesium 200 MG TABS Take 200 mg by mouth daily.      metFORMIN (GLUCOPHAGE) 500 MG tablet TAKE 1 TABLET BY MOUTH 2 TIMES DAILY WITH A MEAL. 180 tablet 0   OnabotulinumtoxinA (BOTOX IJ) Inject as directed as needed.     torsemide (DEMADEX) 10 MG tablet TAKE 1 TABLET BY MOUTH EVERY DAY 90 tablet 0   atorvastatin  (LIPITOR) 10 MG tablet TAKE 1 TABLET DAILY 90 tablet 1   hydrALAZINE (APRESOLINE) 50 MG tablet TAKE 1 TABLET BY MOUTH TWICE A DAY 180 tablet 0   No facility-administered medications prior to visit.    ROS Review of Systems  Constitutional:  Negative for chills, diaphoresis, fatigue and unexpected weight change.  HENT: Negative.    Eyes:  Negative for visual disturbance.  Respiratory:  Negative for cough, chest tightness, shortness of breath and wheezing.   Cardiovascular:  Negative for chest pain, palpitations and leg swelling.  Gastrointestinal:  Negative for abdominal pain, blood in stool, constipation, diarrhea, nausea and vomiting.  Endocrine: Negative.   Genitourinary: Negative.  Negative for difficulty urinating, dysuria and hematuria.  Musculoskeletal:  Positive for myalgias. Negative for arthralgias.  Neurological:  Positive for headaches. Negative for dizziness, weakness and light-headedness.  Hematological:  Negative for adenopathy. Does not bruise/bleed easily.  Psychiatric/Behavioral: Negative.     Objective:  BP (!) 156/78 (BP Location: Left Arm, Patient Position: Sitting, Cuff Size: Large)   Pulse 70   Temp 98.2 F (36.8 C) (Oral)   Resp 16   Ht '5\' 7"'  (1.702 m)   Wt 186 lb (84.4 kg)   SpO2 96%   BMI 29.13 kg/m   BP Readings from Last 3 Encounters:  12/17/20 (!) 156/78  10/16/20 (!) 153/75  08/26/20 Marland Kitchen)  155/71    Wt Readings from Last 3 Encounters:  12/17/20 186 lb (84.4 kg)  10/16/20 186 lb (84.4 kg)  08/26/20 185 lb (83.9 kg)    Physical Exam Vitals reviewed.  HENT:     Nose: Nose normal.     Mouth/Throat:     Mouth: Mucous membranes are moist.  Eyes:     Conjunctiva/sclera: Conjunctivae normal.  Cardiovascular:     Rate and Rhythm: Normal rate and regular rhythm.     Heart sounds: No murmur heard. Pulmonary:     Effort: Pulmonary effort is normal.     Breath sounds: No stridor. No wheezing, rhonchi or rales.  Abdominal:     General: Abdomen  is flat.     Palpations: There is no mass.     Tenderness: There is no abdominal tenderness. There is no guarding.     Hernia: No hernia is present.  Musculoskeletal:        General: Normal range of motion.     Cervical back: Neck supple.     Right lower leg: No edema.     Left lower leg: No edema.  Lymphadenopathy:     Cervical: No cervical adenopathy.  Skin:    General: Skin is warm and dry.  Neurological:     General: No focal deficit present.     Mental Status: She is alert.  Psychiatric:        Mood and Affect: Mood normal.        Behavior: Behavior normal.    Lab Results  Component Value Date   WBC 7.3 12/17/2020   HGB 12.6 12/17/2020   HCT 37.9 12/17/2020   PLT 309.0 12/17/2020   GLUCOSE 118 (H) 12/17/2020   CHOL 114 03/26/2020   TRIG 154.0 (H) 03/26/2020   HDL 41.50 03/26/2020   LDLDIRECT 50.0 07/03/2019   LDLCALC 41 03/26/2020   ALT 16 12/17/2020   AST 14 12/17/2020   NA 140 12/17/2020   K 4.0 12/17/2020   CL 104 12/17/2020   CREATININE 1.14 12/17/2020   BUN 18 12/17/2020   CO2 28 12/17/2020   TSH 2.13 12/17/2020   INR 1.7 (A) 03/31/2018   HGBA1C 6.4 12/17/2020   MICROALBUR <0.7 03/26/2020    MM 3D SCREEN BREAST BILATERAL  Result Date: 10/23/2020 CLINICAL DATA:  Screening. EXAM: DIGITAL SCREENING BILATERAL MAMMOGRAM WITH TOMOSYNTHESIS AND CAD TECHNIQUE: Bilateral screening digital craniocaudal and mediolateral oblique mammograms were obtained. Bilateral screening digital breast tomosynthesis was performed. The images were evaluated with computer-aided detection. COMPARISON:  Previous exam(s). ACR Breast Density Category b: There are scattered areas of fibroglandular density. FINDINGS: There are no findings suspicious for malignancy. IMPRESSION: No mammographic evidence of malignancy. A result letter of this screening mammogram will be mailed directly to the patient. RECOMMENDATION: Screening mammogram in one year. (Code:SM-B-01Y) BI-RADS CATEGORY  1: Negative.  Electronically Signed   By: Lillia Mountain M.D.   On: 10/23/2020 13:19    Assessment & Plan:   Mercedez was seen today for hypertension and hyperlipidemia.  Diagnoses and all orders for this visit:  Primary hypertension- Her blood pressure is not adequately well controlled.  Will increase the dose of hydralazine and start an SGLT2 inhibitor. -     CBC with Differential/Platelet; Future -     Basic metabolic panel; Future -     Hepatic function panel; Future -     TSH; Future -     TSH -     Hepatic function panel -  Basic metabolic panel -     CBC with Differential/Platelet -     hydrALAZINE (APRESOLINE) 50 MG tablet; Take 1 tablet (50 mg total) by mouth 4 (four) times daily.  Type 2 diabetes mellitus without complication, without long-term current use of insulin ([place])- Will start an SGLT2 inhibitor for blood sugar control and renal protection. -     Basic metabolic panel; Future -     Hemoglobin A1c; Future -     Hepatic function panel; Future -     Hepatic function panel -     Hemoglobin A1c -     Basic metabolic panel -     Discontinue: dapagliflozin propanediol (FARXIGA) 10 MG TABS tablet; Take 1 tablet (10 mg total) by mouth daily before breakfast. -     dapagliflozin propanediol (FARXIGA) 10 MG TABS tablet; Take 1 tablet (10 mg total) by mouth daily before breakfast.  Hypothyroidism, unspecified type- Her TSH is in the normal range.  She will stay on the current dose of levothyroxine. -     Hepatic function panel; Future -     TSH; Future -     TSH -     Hepatic function panel  Stage 3a chronic kidney disease (HCC) -     CBC with Differential/Platelet; Future -     Basic metabolic panel; Future -     Hepatic function panel; Future -     Urinalysis, Routine w reflex microscopic; Future -     Urinalysis, Routine w reflex microscopic -     Hepatic function panel -     Basic metabolic panel -     CBC with Differential/Platelet -     Discontinue: dapagliflozin propanediol  (FARXIGA) 10 MG TABS tablet; Take 1 tablet (10 mg total) by mouth daily before breakfast. -     dapagliflozin propanediol (FARXIGA) 10 MG TABS tablet; Take 1 tablet (10 mg total) by mouth daily before breakfast.  Need for shingles vaccine -     Zoster Vaccine Adjuvanted [facility]) injection; Inject 0.5 mLs into the muscle once for 1 dose.  Flu vaccine need -     Flu Vaccine QUAD High Dose(Fluad)  Myalgia- Her labs are negative for myopathy or myositis.  Will discontinue the statin and consider restarting at her next visit. -     C-reactive protein; Future -     CK; Future -     CK -     C-reactive protein  Dyslipidemia, goal LDL below 100 -     CK; Future -     CK  I have discontinued Mone S. Whipple's atorvastatin. I have also changed her hydrALAZINE. Additionally, I am having her start on Shingrix. Lastly, I am having her maintain her Magnesium, doxylamine (Sleep), OneTouch Verio Flex System, onetouch ultrasoft, OneTouch Verio, OneTouch Verio, irbesartan, OnabotulinumtoxinA (BOTOX IJ), levothyroxine, torsemide, metFORMIN, amLODipine, DULoxetine, and dapagliflozin propanediol.  Meds ordered this encounter  Medications   Zoster Vaccine Adjuvanted [facility]) injection    Sig: Inject 0.5 mLs into the muscle once for 1 dose.    Dispense:  0.5 mL    Refill:  1   DISCONTD: dapagliflozin propanediol (FARXIGA) 10 MG TABS tablet    Sig: Take 1 tablet (10 mg total) by mouth daily before breakfast.    Dispense:  90 tablet    Refill:  1   dapagliflozin propanediol (FARXIGA) 10 MG TABS tablet    Sig: Take 1 tablet (10 mg total) by mouth daily before breakfast.  Dispense:  90 tablet    Refill:  1   hydrALAZINE (APRESOLINE) 50 MG tablet    Sig: Take 1 tablet (50 mg total) by mouth 4 (four) times daily.    Dispense:  360 tablet    Refill:  0      Follow-up: Return in about 3 months (around 03/19/2021).  Scarlette Calico, MD

## 2020-12-23 NOTE — Progress Notes (Signed)
Carelink Summary Report / Loop Recorder 

## 2021-01-12 ENCOUNTER — Other Ambulatory Visit: Payer: Self-pay | Admitting: Internal Medicine

## 2021-01-12 ENCOUNTER — Encounter: Payer: Self-pay | Admitting: Internal Medicine

## 2021-01-12 DIAGNOSIS — I10 Essential (primary) hypertension: Secondary | ICD-10-CM

## 2021-01-12 MED ORDER — TORSEMIDE 20 MG PO TABS: 20.0000 mg | ORAL_TABLET | Freq: Every day | ORAL | 0 refills | Status: DC

## 2021-01-19 ENCOUNTER — Ambulatory Visit (INDEPENDENT_AMBULATORY_CARE_PROVIDER_SITE_OTHER): Payer: Medicare Other

## 2021-01-19 ENCOUNTER — Ambulatory Visit: Payer: Medicare Other

## 2021-01-19 DIAGNOSIS — I480 Paroxysmal atrial fibrillation: Secondary | ICD-10-CM

## 2021-01-19 NOTE — Telephone Encounter (Signed)
Our office received a duplicate clearance request from requesting provider. I called [facility] as our office faxed over clearance notes 12/05/20. It was stated that not sure if they did receive the clearance notes back from 12/05/20 from our office. I was asked if I could please fax to another fax # and will be sure to get it to surgery scheduler Cheri. Alternate fax# given to me today was [phone].

## 2021-01-20 ENCOUNTER — Ambulatory Visit: Payer: Self-pay

## 2021-01-20 LAB — CUP PACEART REMOTE DEVICE CHECK
Date Time Interrogation Session: 20221121223312
Implantable Pulse Generator Implant Date: 20191008

## 2021-01-22 ENCOUNTER — Ambulatory Visit: Payer: Medicare Other

## 2021-01-25 ENCOUNTER — Ambulatory Visit
Admission: RE | Admit: 2021-01-25 | Discharge: 2021-01-25 | Disposition: A | Discharge status: Home / Self Care | Payer: Medicare Other | Source: Ambulatory Visit

## 2021-01-25 ENCOUNTER — Other Ambulatory Visit: Payer: Self-pay

## 2021-01-25 VITALS — BP 156/71 | HR 70 | Temp 97.7°F | Resp 18

## 2021-01-25 DIAGNOSIS — R051 Acute cough: Secondary | ICD-10-CM

## 2021-01-25 DIAGNOSIS — J069 Acute upper respiratory infection, unspecified: Secondary | ICD-10-CM | POA: Diagnosis not present

## 2021-01-25 MED ORDER — BENZONATATE 100 MG PO CAPS: 100.0000 mg | ORAL_CAPSULE | Freq: Three times a day (TID) | ORAL | 0 refills | Status: DC | PRN

## 2021-01-25 MED ORDER — PREDNISONE 10 MG PO TABS: 20.0000 mg | ORAL_TABLET | Freq: Every day | ORAL | 0 refills | Status: AC

## 2021-01-25 MED ORDER — AZITHROMYCIN 500 MG PO TABS: ORAL_TABLET | ORAL | 0 refills | Status: AC

## 2021-01-25 NOTE — Discharge Instructions (Signed)
It appears that you have an upper respiratory infection.  You are being treated with antibiotic, prednisone, cough medication.  Please follow-up if symptoms persist or worsen.

## 2021-01-25 NOTE — ED Provider Notes (Signed)
[facility]    CSN: [id number] Arrival date & time: 01/25/21  1152      History   Chief Complaint Chief Complaint  Patient presents with   12p appointment   sinus pressure    HPI Ashley Woodard is a 70 y.o. female.   Patient presents with a 2-week history of headache, sinus pressure, productive cough.  Cough is productive with yellow sputum at times.  Patient denies chest pain, shortness of breath, nausea, vomiting, diarrhea, abdominal pain.  Patient has taken over-the-counter cough and cold medications with temporary relief.  Denies any fevers or known sick contacts.    Past Medical History:  Diagnosis Date   Abnormal cardiovascular stress test 08/21/2016   a. done for abnl EKG/cardiac risk factors -> admitted for cath 07/2016 which showed no significant CAD, normal LV contraction, mildly elevated filling pressure. Low dose Imdur was added for possible component of microvascular dysfunction.    Allergy    Anxiety    Arthritis    "back, knees, fingers" (05/10/2017)   Chronic lower back pain    Colon polyps    Deafness in right ear    Depression    Diverticulosis    Hx of adenomatous colonic polyps 05/08/2003   Hypertension    Hypertriglyceridemia    Hypothyroidism    Migraine    "none in the 2000s" (05/10/2017)   Osteoporosis    Paroxysmal atrial fibrillation ([place])    Pneumonia    "several times" (05/10/2017)   PONV (postoperative nausea and vomiting)    S/P cardiac catheterization, 08/20/16 minimal CAD 08/21/2016   Type II diabetes mellitus ([place])    Voice tremor     Patient Active Problem List   Diagnosis Date Noted   Flu vaccine need 12/17/2020   Estrogen deficiency 05/15/2020   Hyperlipidemia LDL goal <70 05/12/2020   Recurrent major depressive disorder, in full remission ([place]) 05/12/2020   Stage 3a chronic kidney disease ([place]) 03/27/2020   Dyslipidemia, goal LDL below 100 03/27/2020   Pure hyperglyceridemia 03/27/2020   Diuretic-induced  hypokalemia 03/27/2020   Primary hypertension 03/26/2020   Paroxysmal atrial fibrillation ([place]) 05/10/2017   Hypothyroidism 02/24/2017   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   S/P cardiac catheterization, 08/20/16 minimal CAD  08/21/2016   Unstable angina (HCC)    DDD (degenerative disc disease), lumbar 06/29/2016   Meniere's disease 06/29/2016   Diabetes mellitus, type 2 ([place]) 09/30/2014    Past Surgical History:  Procedure Laterality Date   ABDOMINAL HYSTERECTOMY  1988   ATRIAL FIBRILLATION ABLATION N/A 05/10/2017   Procedure: ATRIAL FIBRILLATION ABLATION;  Surgeon: Thompson Grayer, MD;  Location: [facility];  Service: Cardiovascular;  Laterality: N/A;   COCHLEAR IMPLANT Right 2008   COLONOSCOPY     LEFT HEART CATH AND CORONARY ANGIOGRAPHY N/A 08/20/2016   Procedure: Left Heart Cath and Coronary Angiography;  Surgeon: Nelva Bush, MD;  Location: [facility];  Service: Cardiovascular;  Laterality: N/A;   LOOP RECORDER INSERTION N/A 11/29/2017   Procedure: LOOP RECORDER INSERTION;  Surgeon: Thompson Grayer, MD;  Location: [facility];  Service: Cardiovascular;  Laterality: N/A;   ORIF ANKLE FRACTURE Right 02/06/2019   Procedure: OPEN REDUCTION INTERNAL FIXATION (ORIF) ANKLE FRACTURE LATERAL MALLEOLUS, SYNDESMOSIS, RIGHT;  Surgeon: Erle Crocker, MD;  Location: [facility];  Service: Orthopedics;  Laterality: Right;  SURGERY REQUEST TIME: 1.5 HOURS  CPT CODES: 63845, 36468, 03212, 24825    OB History   No obstetric history on  file.      Home Medications    Prior to Admission medications   Medication Sig Start Date End Date Taking? Authorizing Provider  amLODipine (NORVASC) 10 MG tablet Take 10 mg by mouth daily.   Yes [provider]  azithromycin (ZITHROMAX) 500 MG tablet Take 1 tablet (500 mg total) by mouth daily for 1 day, THEN 0.5 tablets (250 mg total) daily for 4 days. 01/25/21 01/30/21 Yes Bettie Capistran, Michele Rockers, FNP  benzonatate  (TESSALON) 100 MG capsule Take 1 capsule (100 mg total) by mouth every 8 (eight) hours as needed for cough. 01/25/21  Yes Tajai Ihde, Hildred Alamin E, FNP  predniSONE (DELTASONE) 10 MG tablet Take 2 tablets (20 mg total) by mouth daily for 5 days. 01/25/21 01/30/21 Yes Semaje Kinker, Michele Rockers, FNP  torsemide (DEMADEX) 20 MG tablet Take 1 tablet (20 mg total) by mouth daily. 01/12/21   Janith Lima, MD  atorvastatin (LIPITOR) 10 MG tablet Take 10 mg by mouth daily. 01/08/21   [provider]  Blood Glucose Calibration (ONETOUCH VERIO) SOLN 1 Act by In Vitro route in the morning and at bedtime. 06/09/20   Janith Lima, MD  Blood Glucose Monitoring Suppl (ONETOUCH VERIO FLEX SYSTEM) w/Device KIT 1 Act by Does not apply route in the morning and at bedtime. 05/12/20   Janith Lima, MD  dapagliflozin propanediol (FARXIGA) 10 MG TABS tablet Take 1 tablet (10 mg total) by mouth daily before breakfast. 12/17/20   Janith Lima, MD  doxylamine, Sleep, (UNISOM) 25 MG tablet Take 25 mg by mouth at bedtime.    [provider]  DULoxetine (CYMBALTA) 30 MG capsule TAKE 1 CAPSULE BY MOUTH EVERY DAY 12/14/20   Janith Lima, MD  glucose blood (ONETOUCH VERIO) test strip 1 each by Other route in the morning and at bedtime. Use as instructed 05/12/20   Janith Lima, MD  hydrALAZINE (APRESOLINE) 50 MG tablet Take 1 tablet (50 mg total) by mouth 4 (four) times daily. 12/17/20   Janith Lima, MD  irbesartan (AVAPRO) 150 MG tablet Take 1 tablet (150 mg total) by mouth daily. 10/02/20   Janith Lima, MD  Lancets [facility] ULTRASOFT) lancets 1 each by Other route in the morning and at bedtime. Use as instructed 05/12/20   Janith Lima, MD  levothyroxine (SYNTHROID) 88 MCG tablet TAKE 1 TABLET DAILY 11/03/20   Janith Lima, MD  Magnesium 200 MG TABS Take 200 mg by mouth daily.     [provider]  metFORMIN (GLUCOPHAGE) 500 MG tablet TAKE 1 TABLET BY MOUTH 2 TIMES DAILY WITH A MEAL. 12/04/20   Janith Lima, MD  OnabotulinumtoxinA (BOTOX IJ) Inject as directed as needed.    [provider]    Family History Family History  Problem Relation Age of Onset   Hypertension Mother    Diabetes Mother    Heart attack Mother    Colon polyps Mother    Hypertension Father    Heart attack Father    Stroke Father    Breast cancer Sister 27   Hypertension Brother    Cancer Brother        Prostate   Diabetes Brother    Dementia Brother    Stroke Brother    Heart attack Brother    Prostate cancer Brother    Hypertension Son    Breast cancer Maternal Aunt        in 30's   Cancer Maternal Uncle  Lung   Colon cancer Neg Hx    Esophageal cancer Neg Hx    Rectal cancer Neg Hx    Stomach cancer Neg Hx     Social History Social History   Tobacco Use   Smoking status: Never   Smokeless tobacco: Never  Vaping Use   Vaping Use: Never used  Substance Use Topics   Alcohol use: No   Drug use: No     Allergies   Amlodipine, Demeclocycline, Doxycycline, Erythromycin, Lisinopril, and Tetracyclines & related   Review of Systems Review of Systems Per HPI  Physical Exam Triage Vital Signs ED Triage Vitals  Enc Vitals Group     BP 01/25/21 1241 (!) 156/71     Pulse Rate 01/25/21 1241 70     Resp 01/25/21 1241 18     Temp 01/25/21 1241 97.7 F (36.5 C)     Temp Source 01/25/21 1241 Oral     SpO2 01/25/21 1241 96 %     Weight --      Height --      Head Circumference --      Peak Flow --      Pain Score 01/25/21 1242 4     Pain Loc --      Pain Edu? --      Excl. in [place]? --    No data found.  Updated Vital Signs BP (!) 156/71 (BP Location: Right Arm)   Pulse 70   Temp 97.7 F (36.5 C) (Oral)   Resp 18   SpO2 96%   Visual Acuity Right Eye Distance:   Left Eye Distance:   Bilateral Distance:    Right Eye Near:   Left Eye Near:    Bilateral Near:     Physical Exam Constitutional:      General: She is not in acute distress.    Appearance:  Normal appearance. She is not toxic-appearing or diaphoretic.  HENT:     Head: Normocephalic and atraumatic.     Right Ear: Tympanic membrane and ear canal normal.     Left Ear: Tympanic membrane and ear canal normal.     Nose: Congestion present.     Right Sinus: Maxillary sinus tenderness present. No frontal sinus tenderness.     Left Sinus: Maxillary sinus tenderness present. No frontal sinus tenderness.     Mouth/Throat:     Mouth: Mucous membranes are moist.     Pharynx: No posterior oropharyngeal erythema.  Eyes:     Extraocular Movements: Extraocular movements intact.     Conjunctiva/sclera: Conjunctivae normal.     Pupils: Pupils are equal, round, and reactive to light.  Cardiovascular:     Rate and Rhythm: Normal rate and regular rhythm.     Pulses: Normal pulses.     Heart sounds: Normal heart sounds.  Pulmonary:     Effort: Pulmonary effort is normal. No respiratory distress.     Breath sounds: Normal breath sounds. No stridor. No wheezing, rhonchi or rales.  Abdominal:     General: Abdomen is flat. Bowel sounds are normal.     Palpations: Abdomen is soft.  Musculoskeletal:        General: Normal range of motion.     Cervical back: Normal range of motion.  Skin:    General: Skin is warm and dry.  Neurological:     General: No focal deficit present.     Mental Status: She is alert and oriented to person, place, and time. Mental status  is at baseline.  Psychiatric:        Mood and Affect: Mood normal.        Behavior: Behavior normal.     UC Treatments / Results  Labs (all labs ordered are listed, but only abnormal results are displayed) Labs Reviewed - No data to display  EKG   Radiology No results found.  Procedures Procedures (including critical care time)  Medications Ordered in UC Medications - No data to display  Initial Impression / Assessment and Plan / UC Course  I have reviewed the triage vital signs and the nursing notes.  Pertinent labs  & imaging results that were available during my care of the patient were reviewed by me and considered in my medical decision making (see chart for details).     Patient presents with symptoms likely from a viral upper respiratory infection. Differential includes bacterial pneumonia, sinusitis, allergic rhinitis, Covid 19, flu. Do not suspect underlying cardiopulmonary process. Symptoms seem unlikely related to ACS, CHF or COPD exacerbations, pneumonia, pneumothorax. Patient is nontoxic appearing and not in need of emergent medical intervention.  Do not think that COVID and flu testing is necessary given duration of symptoms.  Recommended chest x-ray due to duration of symptoms but patient declined.  Recommended symptom control with over the counter medications that are safe with high blood pressure.  Will prescribe azithromycin as well as prednisone steroid.  Patient reports that she has taken prednisone steroid safely before and her blood glucose typically only elevates to mid 100s while on prednisone.  Will prescribe low-dose and short course.  Patient to monitor blood glucose closely while on prednisone.  Benzonatate to take as needed for cough as well.  Return if symptoms fail to improve.  Patient states understanding and is agreeable.  Discharged with PCP followup.  Final Clinical Impressions(s) / UC Diagnoses   Final diagnoses:  Acute upper respiratory infection  Acute cough     Discharge Instructions      It appears that you have an upper respiratory infection.  You are being treated with antibiotic, prednisone, cough medication.  Please follow-up if symptoms persist or worsen.    ED Prescriptions     Medication Sig Dispense Auth. Provider   azithromycin (ZITHROMAX) 500 MG tablet Take 1 tablet (500 mg total) by mouth daily for 1 day, THEN 0.5 tablets (250 mg total) daily for 4 days. 3 tablet [place], [place] E, [place]   predniSONE (DELTASONE) 10 MG tablet Take 2 tablets (20 mg total)  by mouth daily for 5 days. 10 tablet [place], [place] E, [place]   benzonatate (TESSALON) 100 MG capsule Take 1 capsule (100 mg total) by mouth every 8 (eight) hours as needed for cough. 21 capsule [place], Michele Rockers, [place]      PDMP not reviewed this encounter.   Teodora Medici, [place] 01/25/21 [phone]

## 2021-01-25 NOTE — ED Triage Notes (Signed)
2 week h/o HA, sinus pressure, cough and post nasal drip. Cough worsens at night. Has been otc meds with temporary relief.

## 2021-01-27 NOTE — Progress Notes (Signed)
Carelink Summary Report / Loop Recorder 

## 2021-01-30 ENCOUNTER — Telehealth: Payer: Self-pay | Admitting: Internal Medicine

## 2021-01-30 NOTE — Telephone Encounter (Signed)
Pt called [facility] as she is moving to [place] in Feb 2023. After moving she would like a closer PCP office. Pt will continue and keep January appt with Dr. Yetta Barre.    Please advise

## 2021-02-04 DIAGNOSIS — H2512 Age-related nuclear cataract, left eye: Secondary | ICD-10-CM | POA: Diagnosis not present

## 2021-02-04 NOTE — Telephone Encounter (Signed)
[facility] Admin team - please call to schedule TOC °

## 2021-02-05 DIAGNOSIS — H2511 Age-related nuclear cataract, right eye: Secondary | ICD-10-CM | POA: Diagnosis not present

## 2021-02-09 ENCOUNTER — Other Ambulatory Visit: Payer: Self-pay | Admitting: Internal Medicine

## 2021-02-09 ENCOUNTER — Telehealth: Payer: Medicare Other | Admitting: Family Medicine

## 2021-02-09 DIAGNOSIS — R0981 Nasal congestion: Secondary | ICD-10-CM

## 2021-02-09 NOTE — Progress Notes (Signed)
Ashley Woodard  On going sinus trouble post antibiotics treatment at start of month Reports feeling worse.   In person eval needed

## 2021-02-10 ENCOUNTER — Ambulatory Visit
Admission: RE | Admit: 2021-02-10 | Discharge: 2021-02-10 | Disposition: A | Discharge status: Home / Self Care | Payer: Medicare Other | Source: Ambulatory Visit | Attending: Physician Assistant | Admitting: Physician Assistant

## 2021-02-10 ENCOUNTER — Other Ambulatory Visit: Payer: Self-pay | Admitting: Internal Medicine

## 2021-02-10 ENCOUNTER — Other Ambulatory Visit: Payer: Self-pay

## 2021-02-10 VITALS — BP 119/69 | HR 68 | Temp 98.1°F | Ht 67.0 in | Wt 187.0 lb

## 2021-02-10 DIAGNOSIS — E119 Type 2 diabetes mellitus without complications: Secondary | ICD-10-CM

## 2021-02-10 DIAGNOSIS — I10 Essential (primary) hypertension: Secondary | ICD-10-CM

## 2021-02-10 DIAGNOSIS — J019 Acute sinusitis, unspecified: Secondary | ICD-10-CM | POA: Diagnosis not present

## 2021-02-10 MED ORDER — IRBESARTAN 300 MG PO TABS: 300.0000 mg | ORAL_TABLET | Freq: Every day | ORAL | 0 refills | Status: DC

## 2021-02-10 MED ORDER — AMOXICILLIN-POT CLAVULANATE 875-125 MG PO TABS: 1.0000 | ORAL_TABLET | Freq: Two times a day (BID) | ORAL | 0 refills | Status: DC

## 2021-02-10 NOTE — ED Provider Notes (Signed)
°[facility] ° ° ° °CSN: [id number] °Arrival date & time: 02/10/21  0903 ° ° °  ° °History   °Chief Complaint °Chief Complaint  °Patient presents with  ° Appointment  °  Sinus Infection  ° ° °HPI °Ashley Woodard is a 70 y.o. female.  ° °Patient here today for evaluation of continued sinus congestion that she has had for the last 4 weeks.  She states that she was prescribed prednisone and Z-Pak and did have mild improvement but then symptoms returned.  She has a cough at times but only if drainage is significant.  She denies fever. ° °The history is provided by the patient.  ° °Past Medical History:  °Diagnosis Date  ° Abnormal cardiovascular stress test 08/21/2016  ° a. done for abnl EKG/cardiac risk factors -> admitted for cath 07/2016 which showed no significant CAD, normal LV contraction, mildly elevated filling pressure. Low dose Imdur was added for possible component of microvascular dysfunction.   ° Allergy   ° Anxiety   ° Arthritis   ° "back, knees, fingers" (05/10/2017)  ° Chronic lower back pain   ° Colon polyps   ° Deafness in right ear   ° Depression   ° Diverticulosis   ° Hx of adenomatous colonic polyps 05/08/2003  ° Hypertension   ° Hypertriglyceridemia   ° Hypothyroidism   ° Migraine   ° "none in the 2000s" (05/10/2017)  ° Osteoporosis   ° Paroxysmal atrial fibrillation (HCC)   ° Pneumonia   ° "several times" (05/10/2017)  ° PONV (postoperative nausea and vomiting)   ° S/P cardiac catheterization, 08/20/16 minimal CAD 08/21/2016  ° Type II diabetes mellitus (HCC)   ° Voice tremor   ° ° °Patient Active Problem List  ° Diagnosis Date Noted  ° Flu vaccine need 12/17/2020  ° Estrogen deficiency 05/15/2020  ° Hyperlipidemia LDL goal <70 05/12/2020  ° Recurrent major depressive disorder, in full remission (HCC) 05/12/2020  ° Stage 3a chronic kidney disease (HCC) 03/27/2020  ° Dyslipidemia, goal LDL below 100 03/27/2020  ° Pure hyperglyceridemia 03/27/2020  ° Diuretic-induced hypokalemia 03/27/2020  °  Primary hypertension 03/26/2020  ° Paroxysmal atrial fibrillation (HCC) 05/10/2017  ° Hypothyroidism 02/24/2017  ° 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  ° S/P cardiac catheterization, 08/20/16 minimal CAD  08/21/2016  ° Unstable angina (HCC)   ° DDD (degenerative disc disease), lumbar 06/29/2016  ° Meniere's disease 06/29/2016  ° Diabetes mellitus, type 2 (HCC) 09/30/2014  ° ° °Past Surgical History:  °Procedure Laterality Date  ° ABDOMINAL HYSTERECTOMY  1988  ° ATRIAL FIBRILLATION ABLATION N/A 05/10/2017  ° Procedure: ATRIAL FIBRILLATION ABLATION;  Surgeon: Allred, James, MD;  Location: [facility];  Service: Cardiovascular;  Laterality: N/A;  ° COCHLEAR IMPLANT Right 2008  ° COLONOSCOPY    ° LEFT HEART CATH AND CORONARY ANGIOGRAPHY N/A 08/20/2016  ° Procedure: Left Heart Cath and Coronary Angiography;  Surgeon: End, Christopher, MD;  Location: [facility];  Service: Cardiovascular;  Laterality: N/A;  ° LOOP RECORDER INSERTION N/A 11/29/2017  ° Procedure: LOOP RECORDER INSERTION;  Surgeon: Allred, James, MD;  Location: [facility];  Service: Cardiovascular;  Laterality: N/A;  ° ORIF ANKLE FRACTURE Right 02/06/2019  ° Procedure: OPEN REDUCTION INTERNAL FIXATION (ORIF) ANKLE FRACTURE LATERAL MALLEOLUS, SYNDESMOSIS, RIGHT;  Surgeon: Adair, Christopher R, MD;  Location: [facility];  Service: Orthopedics;  Laterality: Right;  SURGERY REQUEST TIME: 1.5 HOURS  °CPT CODES: 27792, 27829, 27698, 27610  ° ° °OB   History   °No obstetric history on file. °  ° ° ° °Home Medications   ° °Prior to Admission medications   °Medication Sig Start Date End Date Taking? Authorizing Provider  °amLODipine (NORVASC) 10 MG tablet Take 10 mg by mouth daily.   Yes [provider]  °amoxicillin-clavulanate (AUGMENTIN) 875-125 MG tablet Take 1 tablet by mouth every 12 (twelve) hours. 02/10/21  Yes Myers, Rebecca F, PA-C  °atorvastatin (LIPITOR) 10 MG tablet Take 10 mg by mouth daily. 01/08/21  Yes [provider]  °benzonatate (TESSALON) 100 MG capsule Take 1 capsule (100 mg total) by mouth every 8 (eight) hours as needed for cough. 01/25/21  Yes Mound, Haley E, FNP  °Blood Glucose Calibration (ONETOUCH VERIO) SOLN 1 Act by In Vitro route in the morning and at bedtime. 06/09/20  Yes Jones, Thomas L, MD  °Blood Glucose Monitoring Suppl (ONETOUCH VERIO FLEX SYSTEM) w/Device KIT 1 Act by Does not apply route in the morning and at bedtime. 05/12/20  Yes Jones, Thomas L, MD  °dapagliflozin propanediol (FARXIGA) 10 MG TABS tablet Take 1 tablet (10 mg total) by mouth daily before breakfast. 12/17/20  Yes Jones, Thomas L, MD  °doxylamine, Sleep, (UNISOM) 25 MG tablet Take 25 mg by mouth at bedtime.   Yes [provider]  °DULoxetine (CYMBALTA) 30 MG capsule TAKE 1 CAPSULE BY MOUTH EVERY DAY 12/14/20  Yes Jones, Thomas L, MD  °glucose blood (ONETOUCH VERIO) test strip 1 each by Other route in the morning and at bedtime. Use as instructed 05/12/20  Yes Jones, Thomas L, MD  °hydrALAZINE (APRESOLINE) 50 MG tablet Take 1 tablet (50 mg total) by mouth 4 (four) times daily. 12/17/20  Yes Jones, Thomas L, MD  °irbesartan (AVAPRO) 150 MG tablet Take 1 tablet (150 mg total) by mouth daily. 10/02/20  Yes Jones, Thomas L, MD  °Lancets (ONETOUCH ULTRASOFT) lancets 1 each by Other route in the morning and at bedtime. Use as instructed 05/12/20  Yes Jones, Thomas L, MD  °levothyroxine (SYNTHROID) 88 MCG tablet TAKE 1 TABLET DAILY 11/03/20  Yes Jones, Thomas L, MD  °Magnesium 200 MG TABS Take 200 mg by mouth daily.    Yes [provider]  °metFORMIN (GLUCOPHAGE) 500 MG tablet TAKE 1 TABLET BY MOUTH 2 TIMES DAILY WITH A MEAL. 12/04/20  Yes Jones, Thomas L, MD  °OnabotulinumtoxinA (BOTOX IJ) Inject as directed as needed.   Yes [provider]  °torsemide (DEMADEX) 20 MG tablet Take 1 tablet (20 mg total) by mouth daily. 01/12/21  Yes Jones, Thomas L, MD  ° ° °Family History °Family History  °Problem Relation Age of  Onset  ° Hypertension Mother   ° Diabetes Mother   ° Heart attack Mother   ° Colon polyps Mother   ° Hypertension Father   ° Heart attack Father   ° Stroke Father   ° Breast cancer Sister 65  ° Hypertension Brother   ° Cancer Brother   °     Prostate  ° Diabetes Brother   ° Dementia Brother   ° Stroke Brother   ° Heart attack Brother   ° Prostate cancer Brother   ° Hypertension Son   ° Breast cancer Maternal Aunt   °     in 30's  ° Cancer Maternal Uncle   °     Lung  ° Colon cancer Neg Hx   ° Esophageal cancer Neg Hx   ° Rectal cancer Neg Hx   ° Stomach   cancer Neg Hx   ° ° °Social History °Social History  ° °Tobacco Use  ° Smoking status: Never  ° Smokeless tobacco: Never  °Vaping Use  ° Vaping Use: Never used  °Substance Use Topics  ° Alcohol use: No  ° Drug use: No  ° ° ° °Allergies   °Amlodipine, Demeclocycline, Doxycycline, Erythromycin, Lisinopril, and Tetracyclines & related ° ° °Review of Systems °Review of Systems  °Constitutional:  Negative for chills and fever.  °HENT:  Positive for congestion and sinus pressure. Negative for ear pain.   °Eyes:  Negative for discharge and redness.  °Respiratory:  Positive for cough. Negative for shortness of breath and wheezing.   °Gastrointestinal:  Negative for abdominal pain, diarrhea, nausea and vomiting.  ° ° °Physical Exam °Triage Vital Signs °ED Triage Vitals  °Enc Vitals Group  °   BP   °   Pulse   °   Resp   °   Temp   °   Temp src   °   SpO2   °   Weight   °   Height   °   Head Circumference   °   Peak Flow   °   Pain Score   °   Pain Loc   °   Pain Edu?   °   Excl. in GC?   ° °No data found. ° °Updated Vital Signs °BP 119/69 (BP Location: Left Arm)    Pulse 68    Temp 98.1 °F (36.7 °C) (Oral)    Ht 5' 7" (1.702 m)    Wt 187 lb (84.8 kg)    SpO2 96%    BMI 29.29 kg/m²  °   ° °Physical Exam °Vitals and nursing note reviewed.  °Constitutional:   °   General: She is not in acute distress. °   Appearance: Normal appearance. She is not ill-appearing.  °HENT:  °    Head: Normocephalic and atraumatic.  °   Right Ear: Tympanic membrane normal.  °   Left Ear: Tympanic membrane normal.  °   Nose: Congestion present.  °   Mouth/Throat:  °   Mouth: Mucous membranes are moist.  °   Pharynx: No oropharyngeal exudate or posterior oropharyngeal erythema.  °Eyes:  °   Conjunctiva/sclera: Conjunctivae normal.  °Cardiovascular:  °   Rate and Rhythm: Normal rate and regular rhythm.  °   Heart sounds: Normal heart sounds. No murmur heard. °Pulmonary:  °   Effort: Pulmonary effort is normal. No respiratory distress.  °   Breath sounds: Normal breath sounds. No wheezing, rhonchi or rales.  °Skin: °   General: Skin is warm and dry.  °Neurological:  °   Mental Status: She is alert.  °Psychiatric:     °   Mood and Affect: Mood normal.     °   Thought Content: Thought content normal.  ° ° ° °UC Treatments / Results  °Labs °(all labs ordered are listed, but only abnormal results are displayed) °Labs Reviewed - No data to display ° °EKG ° ° °Radiology °No results found. ° °Procedures °Procedures (including critical care time) ° °Medications Ordered in UC °Medications - No data to display ° °Initial Impression / Assessment and Plan / UC Course  °I have reviewed the triage vital signs and the nursing notes. ° °Pertinent labs & imaging results that were available during my care of the patient were reviewed by me and considered in my medical decision making (  making (see chart for details).  Augmentin prescribed for sinusitis.  Recommend follow-up if no improvement or symptoms worsen.  Final Clinical Impressions(s) / UC Diagnoses   Final diagnoses:  Acute sinusitis, recurrence not specified, unspecified location   Discharge Instructions   None    ED Prescriptions     Medication Sig Dispense Auth. Provider   amoxicillin-clavulanate (AUGMENTIN) 875-125 MG tablet Take 1 tablet by mouth every 12 (twelve) hours. 14 tablet Francene Finders, PA-C      PDMP not reviewed this encounter.   Francene Finders, PA-C 02/10/21 1029

## 2021-02-10 NOTE — ED Triage Notes (Signed)
Patient c/o sinus infection x 4 weeks, given Prednisone and antibiotic completed course, sx's came right back.  Coughs only when drainage is bad.

## 2021-02-16 LAB — CUP PACEART REMOTE DEVICE CHECK
Date Time Interrogation Session: 20221224223415
Implantable Pulse Generator Implant Date: 20191008

## 2021-02-17 ENCOUNTER — Telehealth: Payer: Self-pay

## 2021-02-17 ENCOUNTER — Ambulatory Visit (INDEPENDENT_AMBULATORY_CARE_PROVIDER_SITE_OTHER): Payer: Medicare Other

## 2021-02-17 DIAGNOSIS — I480 Paroxysmal atrial fibrillation: Secondary | ICD-10-CM

## 2021-02-17 NOTE — Telephone Encounter (Signed)
ILR alert received for 1 AF event on 02/15/21 with duration of 2 hours 56 minutes. No OAC on file. ILR was implanted post AF ablation for reoccurring AF. Patient has upcoming apt. With Dr. Ladona Ridgel on 02/28/20 to discuss explant. Routing to Dr. Ladona Ridgel for review and recommendations.

## 2021-02-17 NOTE — Telephone Encounter (Signed)
No changes at this time. Dr Ladona Ridgel to reassess when he sees her in the office.

## 2021-02-26 NOTE — Progress Notes (Signed)
Carelink Summary Report / Loop Recorder 

## 2021-02-27 ENCOUNTER — Encounter: Payer: Self-pay | Admitting: Internal Medicine

## 2021-02-27 ENCOUNTER — Ambulatory Visit (INDEPENDENT_AMBULATORY_CARE_PROVIDER_SITE_OTHER): Payer: Medicare Other | Admitting: Internal Medicine

## 2021-02-27 ENCOUNTER — Other Ambulatory Visit: Payer: Self-pay

## 2021-02-27 VITALS — BP 122/60 | HR 74 | Ht 67.0 in | Wt 185.0 lb

## 2021-02-27 DIAGNOSIS — I480 Paroxysmal atrial fibrillation: Secondary | ICD-10-CM | POA: Diagnosis not present

## 2021-02-27 MED ORDER — WARFARIN SODIUM 5 MG PO TABS: 5.0000 mg | ORAL_TABLET | Freq: Every day | ORAL | 11 refills | Status: DC

## 2021-02-27 NOTE — Progress Notes (Signed)
HPI Ashley Woodard is referred today by Dr. Rayann Heman on his departing for consideration for ILR removal. She is a pleasant 71 yo woman with a h/o PAF who underwent catheter ablation and then insertion of an ILR. She initially had no atrial fib and her [place] was stopped. She developed atrial fib on 12/25 for almost 3 hours. She returns to discuss device removal. She did not feel the atrial fib she had last month. She has many questions about her HTN which has been difficult to control. She notes that the bp goes up a little later in the day. Allergies  Allergen Reactions   Amlodipine Swelling    Ankle edema   Demeclocycline Rash   Doxycycline Rash   Erythromycin Rash   Lisinopril Cough   Tetracyclines & Related Rash     Current Outpatient Medications  Medication Sig Dispense Refill   amLODipine (NORVASC) 10 MG tablet Take 10 mg by mouth daily.     atorvastatin (LIPITOR) 10 MG tablet Take 10 mg by mouth daily.     Blood Glucose Calibration (ONETOUCH VERIO) SOLN 1 Act by In Vitro route in the morning and at bedtime. 1 each 5   Blood Glucose Monitoring Suppl (ONETOUCH VERIO FLEX SYSTEM) w/Device KIT 1 Act by Does not apply route in the morning and at bedtime. 1 kit 3   dapagliflozin propanediol (FARXIGA) 10 MG TABS tablet Take 1 tablet (10 mg total) by mouth daily before breakfast. 90 tablet 1   doxylamine, Sleep, (UNISOM) 25 MG tablet Take 25 mg by mouth at bedtime.     DULoxetine (CYMBALTA) 30 MG capsule TAKE 1 CAPSULE BY MOUTH EVERY DAY 90 capsule 1   glucose blood (ONETOUCH VERIO) test strip 1 each by Other route in the morning and at bedtime. Use as instructed 100 each 12   hydrALAZINE (APRESOLINE) 50 MG tablet Take 1 tablet (50 mg total) by mouth 4 (four) times daily. 360 tablet 0   irbesartan (AVAPRO) 300 MG tablet Take 1 tablet (300 mg total) by mouth daily. 90 tablet 0   Lancets (ONETOUCH ULTRASOFT) lancets 1 each by Other route in the morning and at bedtime. Use as instructed 100 each  5   levothyroxine (SYNTHROID) 88 MCG tablet TAKE 1 TABLET DAILY 90 tablet 0   metFORMIN (GLUCOPHAGE) 500 MG tablet TAKE 1 TABLET BY MOUTH 2 TIMES DAILY WITH A MEAL. 180 tablet 0   OnabotulinumtoxinA (BOTOX IJ) Inject as directed as needed.     torsemide (DEMADEX) 20 MG tablet Take 1 tablet (20 mg total) by mouth daily. 90 tablet 0   amoxicillin-clavulanate (AUGMENTIN) 875-125 MG tablet Take 1 tablet by mouth every 12 (twelve) hours. 14 tablet 0   benzonatate (TESSALON) 100 MG capsule Take 1 capsule (100 mg total) by mouth every 8 (eight) hours as needed for cough. 21 capsule 0   Magnesium 200 MG TABS Take 200 mg by mouth daily.      No current facility-administered medications for this visit.     Past Medical History:  Diagnosis Date   Abnormal cardiovascular stress test 08/21/2016   a. done for abnl EKG/cardiac risk factors -> admitted for cath 07/2016 which showed no significant CAD, normal LV contraction, mildly elevated filling pressure. Low dose Imdur was added for possible component of microvascular dysfunction.    Allergy    Anxiety    Arthritis    "back, knees, fingers" (05/10/2017)   Chronic lower back pain    Colon polyps  Deafness in right ear    Depression    Diverticulosis    Hx of adenomatous colonic polyps 05/08/2003   Hypertension    Hypertriglyceridemia    Hypothyroidism    Migraine    "none in the 2000s" (05/10/2017)   Osteoporosis    Paroxysmal atrial fibrillation (HCC)    Pneumonia    "several times" (05/10/2017)   PONV (postoperative nausea and vomiting)    S/P cardiac catheterization, 08/20/16 minimal CAD 08/21/2016   Type II diabetes mellitus ([place])    Voice tremor     ROS:   All systems reviewed and negative except as noted in the HPI.   Past Surgical History:  Procedure Laterality Date   ABDOMINAL HYSTERECTOMY  1988   ATRIAL FIBRILLATION ABLATION N/A 05/10/2017   Procedure: ATRIAL FIBRILLATION ABLATION;  Surgeon: Thompson Grayer, MD;  Location: [facility];  Service: Cardiovascular;  Laterality: N/A;   COCHLEAR IMPLANT Right 2008   COLONOSCOPY     LEFT HEART CATH AND CORONARY ANGIOGRAPHY N/A 08/20/2016   Procedure: Left Heart Cath and Coronary Angiography;  Surgeon: Nelva Bush, MD;  Location: [facility];  Service: Cardiovascular;  Laterality: N/A;   LOOP RECORDER INSERTION N/A 11/29/2017   Procedure: LOOP RECORDER INSERTION;  Surgeon: Thompson Grayer, MD;  Location: [facility];  Service: Cardiovascular;  Laterality: N/A;   ORIF ANKLE FRACTURE Right 02/06/2019   Procedure: OPEN REDUCTION INTERNAL FIXATION (ORIF) ANKLE FRACTURE LATERAL MALLEOLUS, SYNDESMOSIS, RIGHT;  Surgeon: Erle Crocker, MD;  Location: [facility];  Service: Orthopedics;  Laterality: Right;  SURGERY REQUEST TIME: 1.5 HOURS  CPT CODES: 38101, 27829, [phone], 27610     Family History  Problem Relation Age of Onset   Hypertension Mother    Diabetes Mother    Heart attack Mother    Colon polyps Mother    Hypertension Father    Heart attack Father    Stroke Father    Breast cancer Sister 65   Hypertension Brother    Cancer Brother        Prostate   Diabetes Brother    Dementia Brother    Stroke Brother    Heart attack Brother    Prostate cancer Brother    Hypertension Son    Breast cancer Maternal Aunt        in 30's   Cancer Maternal Uncle        Lung   Colon cancer Neg Hx    Esophageal cancer Neg Hx    Rectal cancer Neg Hx    Stomach cancer Neg Hx      Social History   Socioeconomic History   Marital status: Married    Spouse name: Not on file   Number of children: 2   Years of education: 12   Highest education level: Not on file  Occupational History   Occupation: Health and safety inspector  Tobacco Use   Smoking status: Never   Smokeless tobacco: Never  Vaping Use   Vaping Use: Never used  Substance and Sexual Activity   Alcohol use: No   Drug use: No   Sexual activity: Not Currently  Other Topics  Concern   Not on file  Social History Narrative   Lives at home with husband in [place].   Right-handed.   No caffeine use.   Manages a call center   Social Determinants of Health   Financial Resource Strain: Not on file  Food Insecurity: Not on file  Transportation  Needs: Not on file  Physical Activity: Not on file  Stress: Not on file  Social Connections: Not on file  Intimate Partner Violence: Not on file     BP 122/60    Pulse 74    Ht '5\' 7"'  (1.702 m)    Wt 185 lb (83.9 kg)    SpO2 98%    BMI 28.98 kg/m   Physical Exam:  Well appearing NAD HEENT: Unremarkable Neck:  No JVD, no thyromegally Lymphatics:  No adenopathy Back:  No CVA tenderness Lungs:  Clear with no wheezes HEART:  Regular rate rhythm, no murmurs, no rubs, no clicks Abd:  soft, positive bowel sounds, no organomegally, no rebound, no guarding Ext:  2 plus pulses, no edema, no cyanosis, no clubbing Skin:  No rashes no nodules Neuro:  CN II through XII intact, motor grossly intact  EKG - nsr  DEVICE  Normal device function.  See PaceArt for details. Reviewed ILR. Atrial fib as above.  Assess/Plan:  PAF - she is s/p ablation and has now had a documented recurrence of atrial fib. We discussed the treatment options. I have recommended an [place]. She had been on xarelto but she does not want this as cost is too high. We will start coumadin with plans to transition to pradaxa once it is available generic.  HTN - I asked her to take the amlodipine and hydralazine either in the morning or evening and irbesartan the other time of the day.  Ashley Woodard Ashley Yazdani,MD

## 2021-02-27 NOTE — Patient Instructions (Signed)
Medication Instructions:  Your physician has recommended you make the following change in your medication:    START taking warfarin 5 mg-  Take one tablet by mouth daily.   Labwork: None ordered.  Testing/Procedures: None ordered.  Follow-Up:  Schedule new start WARFARIN appointment next Thursday.  Your physician wants you to follow-up in: one year with Cristopher Peru, MD or one of the following Advanced Practice Providers on your designated Care Team:   Tommye Standard, [place] Legrand Como "Jonni Sanger" Chalmers Cater, [place]  Remote monthly monitoring is used to monitor your loop recorder from home.  Any Other Special Instructions Will Be Listed Below (If Applicable).  If you need a refill on your cardiac medications before your next appointment, please call your pharmacy.   Warfarin Tablets What is this medication? WARFARIN (WAR far in) prevents and treats blood clots. It may also be used to lower the risk of stroke in people with AFib (atrial fibrillation) or heart valve replacement. It belongs to a group of medications called blood thinners. This medicine may be used for other purposes; ask your health care provider or pharmacist if you have questions. COMMON BRAND NAME(S): Coumadin, Jantoven What should I tell my care team before I take this medication? They need to know if you have any of these conditions: Alcoholism Anemia Bleeding disorders Cancer Diabetes Heart disease High blood pressure History of bleeding in the gastrointestinal tract History of stroke or other brain injury or disease Kidney or liver disease Protein C deficiency Protein S deficiency Psychosis or dementia Recent injury, recent or planned surgery or procedure An unusual or allergic reaction to warfarin, other medications, foods, dyes, or preservatives Pregnant or trying to get pregnant Breast-feeding How should I use this medication? Take this medication by mouth with a glass of water. Follow the directions on the  prescription label. You can take this medication with or without food. Take your medication at the same time each day. Do not take it more often than directed. Do not stop taking except on your care team's advice. Stopping this medication may increase your risk of a blood clot. Be sure to refill your prescription before you run out of medication. If your care team calls to change your dose, write down the dose and any other instructions. Always read the dose and instructions back to him or her to make sure you understand them. Tell your care team what strength of tablets you have on hand. Ask how many tablets you should take to equal your new dose. Write the date on the new instructions and keep them near your medication. If you are told to stop taking your medication until your next blood test, call your care team if you do not hear anything within 24 hours of the test to find out your new dose or when to restart your prior dose. A special MedGuide will be given to you by the pharmacist with each prescription and refill. Be sure to read this information carefully each time. Talk to your care team about the use of this medication in children. Special care may be needed. Overdosage: If you think you have taken too much of this medicine contact a poison control center or emergency room at once. NOTE: This medicine is only for you. Do not share this medicine with others. What if I miss a dose? It is important not to miss a dose. If you miss a dose, call your care team. Take the dose as soon as possible on the same day.  If it is almost time for your next dose, take only that dose. Do not take double or extra doses to make up for a missed dose. What may interact with this medication? Do not take this medication with any of the following: Defibrotide This medication may also interact with the  following: Acyclovir Allopurinol Aprepitant Armodafinil Aspirin Bicalutamide Bosentan Caffeine Capecitabine Certain antibiotics like erythromycin, clarithromycin, ciprofloxacin, cotrimoxazole, metronidazole, norfloxacin, or tigecycline Certain antivirals for HIV or hepatitis Certain medications for blood clots like argatroban, aspirin, bivalirudin, enoxaparin, fondaparinux, heparin, or lepirudin Certain medications for blood pressure, heart disease, irregular heartbeat Certain medications for cholesterol like atorvastatin, lovastatin, and simvastatin Certain medications for depression, anxiety, or psychiatric disorders Certain medications for fungal infections like fluconazole, ketoconazole, itraconazole, posaconazole, or voriconazole Certain medications for seizures like carbamazepine, phenobarbital, phenytoin, rufinamide Cilostazol Clopidogrel Conivaptan Cyclosporine Dipyridamole Disulfiram Female hormones, including contraceptive or birth control pills Herbal or dietary products like garlic, ginkgo, ginseng, green tea, kava kava, red yeast rice, St. John's Wort Isoniazid Methoxsalen Modafinil Nilotinib NSAIDs, medications for pain and inflammation, like ibuprofen or naproxen Oxandrolone Phenylpropanolamine Prasugrel Rifampin Steroid medications like prednisone or cortisone Stomach acid blockers like cimetidine, famotidine, ranitidine, or omeprazole Sulfinpyrazone Thiabendazole Ticlopidine Vitamin K Zafirlukast Zileuton This list may not describe all possible interactions. Give your health care provider a list of all the medicines, herbs, non-prescription drugs, or dietary supplements you use. Also tell them if you smoke, drink alcohol, or use illegal drugs. Some items may interact with your medicine. What should I watch for while using this medication? Visit your care team for regular checks on your progress. You will need to have a blood test called a PT/INR  regularly. The PT/INR blood test is done to make sure you are getting the right dose of this medication. It is important to not miss your appointment for the blood tests. When you first start taking this medication, these tests are done often. Once the correct dose is determined and you take your medication properly, these tests can be done less often. Wear a medical ID bracelet or chain, and carry a card that describes your disease and details of your medication and dosage times. Do not start taking or stop taking any medications or over-the-counter medications except on the advice of your care team. You should discuss your diet with your care team. Do not make major changes in your diet. Vitamin K can affect how well this medication works. Many foods contain vitamin K. It is important to eat a consistent amount of foods with vitamin K. Other foods with vitamin K that you should eat in consistent amounts are asparagus, basil, black-eyed peas, broccoli, brussel sprouts, cabbage, green onions, green tea, parsley, green leafy vegetables like beet greens, collard greens, kale, spinach, turnip greens, or certain lettuces like green leaf or romaine. This medication can cause birth defects or bleeding in an unborn child. Women of childbearing age should use effective birth control while taking this medication. If a woman becomes pregnant while taking this medication, she should discuss the potential risks and her options with her care team. Avoid sports and activities that might cause injury while you are using this medication. Severe falls or injuries can cause unseen bleeding. Be careful when using sharp tools or knives. Consider using an Copy. Take special care brushing or flossing your teeth. Report any injuries, bruising, or red spots on the skin to your care team. If you have an illness that causes vomiting, diarrhea, or fever for  more than a few days, contact your care team. Also, check with your  care team if you are unable to eat for several days. These problems can change the effect of this medication. Even after you stop taking this medication, it takes several days before your body recovers its normal ability to clot blood. Ask your care team how long you need to be careful. If you are going to have surgery or dental work, tell your care team that you have been taking this medication. What side effects may I notice from receiving this medication? Side effects that you should report to your care team as soon as possible: Allergic reactions--skin rash, itching, hives, swelling of the face, lips, tongue, or throat Bleeding--bloody or black, tar-like stools, vomiting blood or brown material that looks like coffee grounds, red or dark brown urine, small red or purple spots on skin, unusual bruising or bleeding Bleeding in the brain--severe headache, stiff neck, confusion, dizziness, change in vision, numbness or weakness of the face, arm or leg, trouble speaking, trouble walking, vomiting Dark or purple painful toes Heavy periods Painful swelling, warmth, or redness of the skin, blisters or sores Side effects that usually do not require medical attention (report to your care team if they continue or are bothersome): Diarrhea Hair loss This list may not describe all possible side effects. Call your doctor for medical advice about side effects. You may report side effects to FDA at 1-800-FDA-1088. Where should I keep my medication? Keep out of the reach of children and pets. Store at room temperature between 15 and 30 degrees C (59 and 86 degrees F). Protect from light. Throw away any unused medication after the expiration date. Do not flush down the toilet. NOTE: This sheet is a summary. It may not cover all possible information. If you have questions about this medicine, talk to your doctor, pharmacist, or health care provider.  2022 Elsevier/Gold Standard (2020-06-18 00:00:00)

## 2021-03-04 DIAGNOSIS — H2511 Age-related nuclear cataract, right eye: Secondary | ICD-10-CM | POA: Diagnosis not present

## 2021-03-05 ENCOUNTER — Ambulatory Visit: Payer: Medicare Other

## 2021-03-06 ENCOUNTER — Other Ambulatory Visit: Payer: Self-pay | Admitting: Internal Medicine

## 2021-03-06 DIAGNOSIS — E119 Type 2 diabetes mellitus without complications: Secondary | ICD-10-CM

## 2021-03-08 ENCOUNTER — Other Ambulatory Visit: Payer: Self-pay | Admitting: Internal Medicine

## 2021-03-08 DIAGNOSIS — I10 Essential (primary) hypertension: Secondary | ICD-10-CM

## 2021-03-12 ENCOUNTER — Ambulatory Visit (INDEPENDENT_AMBULATORY_CARE_PROVIDER_SITE_OTHER): Payer: Medicare Other | Admitting: *Deleted

## 2021-03-12 ENCOUNTER — Other Ambulatory Visit: Payer: Self-pay

## 2021-03-12 DIAGNOSIS — I4891 Unspecified atrial fibrillation: Secondary | ICD-10-CM | POA: Diagnosis not present

## 2021-03-12 DIAGNOSIS — Z5181 Encounter for therapeutic drug level monitoring: Secondary | ICD-10-CM

## 2021-03-12 LAB — POCT INR: INR: 2.2 (ref 2.0–3.0)

## 2021-03-12 NOTE — Patient Instructions (Addendum)
A full discussion of the nature of anticoagulants has been carried out.  A benefit risk analysis has been presented to the patient, so that they understand the justification for choosing anticoagulation at this time. The need for frequent and regular monitoring, precise dosage adjustment and compliance is stressed.  Side effects of potential bleeding are discussed.  The patient should avoid any OTC items containing aspirin or ibuprofen, and should avoid great swings in general diet.  Avoid alcohol consumption.  Call if any signs of abnormal bleeding.   Description   Continue taking 1 tablet (5mg ) daily. Recheck INR in 1 week. Call with any new medications or procedures: Anticoagulation Clinic [phone] Main [phone]

## 2021-03-20 ENCOUNTER — Other Ambulatory Visit: Payer: Self-pay

## 2021-03-20 ENCOUNTER — Ambulatory Visit (INDEPENDENT_AMBULATORY_CARE_PROVIDER_SITE_OTHER): Payer: Medicare Other

## 2021-03-20 DIAGNOSIS — Z5181 Encounter for therapeutic drug level monitoring: Secondary | ICD-10-CM | POA: Diagnosis not present

## 2021-03-20 DIAGNOSIS — I4891 Unspecified atrial fibrillation: Secondary | ICD-10-CM | POA: Diagnosis not present

## 2021-03-20 LAB — POCT INR: INR: 4.2 — AB (ref 2.0–3.0)

## 2021-03-20 NOTE — Patient Instructions (Signed)
Description   Hold tomorrow's dose and Sunday's dose, eat greens and then continue taking 1 tablet (5mg ) daily. Recheck INR in 1 week. Call with any new medications or procedures: Anticoagulation Clinic [phone] Main [phone]

## 2021-03-24 ENCOUNTER — Ambulatory Visit: Payer: Medicare Other | Admitting: Internal Medicine

## 2021-03-24 ENCOUNTER — Ambulatory Visit (INDEPENDENT_AMBULATORY_CARE_PROVIDER_SITE_OTHER): Payer: Medicare Other | Admitting: Family Medicine

## 2021-03-24 ENCOUNTER — Encounter: Payer: Self-pay | Admitting: Family Medicine

## 2021-03-24 ENCOUNTER — Other Ambulatory Visit: Payer: Self-pay

## 2021-03-24 VITALS — BP 128/66 | HR 68 | Temp 97.0°F | Ht 67.0 in | Wt 189.6 lb

## 2021-03-24 DIAGNOSIS — E039 Hypothyroidism, unspecified: Secondary | ICD-10-CM

## 2021-03-24 DIAGNOSIS — E119 Type 2 diabetes mellitus without complications: Secondary | ICD-10-CM | POA: Diagnosis not present

## 2021-03-24 DIAGNOSIS — I10 Essential (primary) hypertension: Secondary | ICD-10-CM | POA: Diagnosis not present

## 2021-03-24 DIAGNOSIS — E7800 Pure hypercholesterolemia, unspecified: Secondary | ICD-10-CM

## 2021-03-24 DIAGNOSIS — N1831 Chronic kidney disease, stage 3a: Secondary | ICD-10-CM | POA: Diagnosis not present

## 2021-03-24 NOTE — Progress Notes (Signed)
Established Patient Office Visit  Subjective:  Patient ID: Ashley Woodard, female    DOB: 10-11-1950  Age: 71 y.o. MRN: [id number]  CC:  Chief Complaint  Patient presents with   Transitions Of Care    TOC from Dr. Ronnald Ramp discuss kidney disease, should she continue with Farxgia and Synthroid , discuss BP medications.     HPI Ashley Woodard presents for follow-up of hypertension, diabetes, CKD, hypothyroidism,.  History of paroxysmal A. fib with hypertension.  Blood pressure currently controlled with amlodipine 10 mg hydralazine 50 twice daily, irbesartan 300 daily and torsemide daily.  She is taking 88 mcg of levothyroxine each morning on a fasting stomach.  Diabetes is better controlled with metformin with the recent addition of Farxiga to preserve renal function.  Stage IIIa CKD at last check.  She has been working on increasing her fluid intake to improve her hydration.  She is not certain that she can continue to afford the [place].  Past Medical History:  Diagnosis Date   Abnormal cardiovascular stress test 08/21/2016   a. done for abnl EKG/cardiac risk factors -> admitted for cath 07/2016 which showed no significant CAD, normal LV contraction, mildly elevated filling pressure. Low dose Imdur was added for possible component of microvascular dysfunction.    Allergy    Anxiety    Arthritis    "back, knees, fingers" (05/10/2017)   Chronic lower back pain    Colon polyps    Deafness in right ear    Depression    Diverticulosis    Hx of adenomatous colonic polyps 05/08/2003   Hypertension    Hypertriglyceridemia    Hypothyroidism    Migraine    "none in the 2000s" (05/10/2017)   Osteoporosis    Paroxysmal atrial fibrillation (HCC)    Pneumonia    "several times" (05/10/2017)   PONV (postoperative nausea and vomiting)    S/P cardiac catheterization, 08/20/16 minimal CAD 08/21/2016   Type II diabetes mellitus ([place])    Voice tremor     Past Surgical History:  Procedure Laterality Date    ABDOMINAL HYSTERECTOMY  1988   ATRIAL FIBRILLATION ABLATION N/A 05/10/2017   Procedure: ATRIAL FIBRILLATION ABLATION;  Surgeon: Thompson Grayer, MD;  Location: [facility];  Service: Cardiovascular;  Laterality: N/A;   COCHLEAR IMPLANT Right 2008   COLONOSCOPY     LEFT HEART CATH AND CORONARY ANGIOGRAPHY N/A 08/20/2016   Procedure: Left Heart Cath and Coronary Angiography;  Surgeon: Nelva Bush, MD;  Location: [facility];  Service: Cardiovascular;  Laterality: N/A;   LOOP RECORDER INSERTION N/A 11/29/2017   Procedure: LOOP RECORDER INSERTION;  Surgeon: Thompson Grayer, MD;  Location: [facility];  Service: Cardiovascular;  Laterality: N/A;   ORIF ANKLE FRACTURE Right 02/06/2019   Procedure: OPEN REDUCTION INTERNAL FIXATION (ORIF) ANKLE FRACTURE LATERAL MALLEOLUS, SYNDESMOSIS, RIGHT;  Surgeon: Erle Crocker, MD;  Location: [facility];  Service: Orthopedics;  Laterality: Right;  SURGERY REQUEST TIME: 1.5 HOURS  CPT CODES: 72820, 27829, 60156, 27610    Family History  Problem Relation Age of Onset   Hypertension Mother    Diabetes Mother    Heart attack Mother    Colon polyps Mother    Hypertension Father    Heart attack Father    Stroke Father    Breast cancer Sister 23   Hypertension Brother    Cancer Brother        Prostate   Diabetes Brother  Dementia Brother    Stroke Brother    Heart attack Brother    Prostate cancer Brother    Hypertension Son    Breast cancer Maternal Aunt        in 30's   Cancer Maternal Uncle        Lung   Colon cancer Neg Hx    Esophageal cancer Neg Hx    Rectal cancer Neg Hx    Stomach cancer Neg Hx     Social History   Socioeconomic History   Marital status: Married    Spouse name: Not on file   Number of children: 2   Years of education: 12   Highest education level: Not on file  Occupational History   Occupation: Health and safety inspector  Tobacco Use   Smoking status: Never   Smokeless tobacco:  Never  Vaping Use   Vaping Use: Never used  Substance and Sexual Activity   Alcohol use: No   Drug use: No   Sexual activity: Not Currently  Other Topics Concern   Not on file  Social History Narrative   Lives at home with husband in [place].   Right-handed.   No caffeine use.   Manages a call center   Social Determinants of Health   Financial Resource Strain: Not on file  Food Insecurity: Not on file  Transportation Needs: Not on file  Physical Activity: Not on file  Stress: Not on file  Social Connections: Not on file  Intimate Partner Violence: Not on file    Outpatient Medications Prior to Visit  Medication Sig Dispense Refill   amLODipine (NORVASC) 10 MG tablet Take 10 mg by mouth daily.     atorvastatin (LIPITOR) 10 MG tablet Take 10 mg by mouth daily.     Blood Glucose Calibration (ONETOUCH VERIO) SOLN 1 Act by In Vitro route in the morning and at bedtime. 1 each 5   Blood Glucose Monitoring Suppl (ONETOUCH VERIO FLEX SYSTEM) w/Device KIT 1 Act by Does not apply route in the morning and at bedtime. 1 kit 3   dapagliflozin propanediol (FARXIGA) 10 MG TABS tablet Take 1 tablet (10 mg total) by mouth daily before breakfast. 90 tablet 1   doxylamine, Sleep, (UNISOM) 25 MG tablet Take 25 mg by mouth at bedtime.     DULoxetine (CYMBALTA) 30 MG capsule TAKE 1 CAPSULE BY MOUTH EVERY DAY 90 capsule 1   glucose blood (ONETOUCH VERIO) test strip 1 each by Other route in the morning and at bedtime. Use as instructed 100 each 12   hydrALAZINE (APRESOLINE) 50 MG tablet TAKE 1 TABLET BY MOUTH TWICE A DAY 180 tablet 0   irbesartan (AVAPRO) 300 MG tablet Take 1 tablet (300 mg total) by mouth daily. 90 tablet 0   Lancets (ONETOUCH ULTRASOFT) lancets 1 each by Other route in the morning and at bedtime. Use as instructed 100 each 5   levothyroxine (SYNTHROID) 88 MCG tablet TAKE 1 TABLET DAILY 90 tablet 0   metFORMIN (GLUCOPHAGE) 500 MG tablet TAKE 1 TABLET BY MOUTH 2 TIMES DAILY WITH A  MEAL. 180 tablet 0   OnabotulinumtoxinA (BOTOX IJ) Inject as directed as needed.     torsemide (DEMADEX) 20 MG tablet Take 1 tablet (20 mg total) by mouth daily. 90 tablet 0   warfarin (COUMADIN) 5 MG tablet Take 1 tablet (5 mg total) by mouth daily. 30 tablet 11   Magnesium 200 MG TABS Take 200 mg by mouth daily.  (Patient not taking: Reported  on 03/24/2021)     benzonatate (TESSALON) 100 MG capsule Take 1 capsule (100 mg total) by mouth every 8 (eight) hours as needed for cough. 21 capsule 0   No facility-administered medications prior to visit.    Allergies  Allergen Reactions   Amlodipine Swelling    Ankle edema   Demeclocycline Rash   Doxycycline Rash   Erythromycin Rash   Lisinopril Cough   Tetracyclines & Related Rash    ROS Review of Systems  Constitutional:  Negative for diaphoresis, fatigue, fever and unexpected weight change.  HENT: Negative.    Eyes:  Negative for photophobia and visual disturbance.  Respiratory: Negative.    Cardiovascular: Negative.   Gastrointestinal: Negative.   Genitourinary:  Negative for decreased urine volume, difficulty urinating and frequency.  Musculoskeletal:  Negative for gait problem and joint swelling.  Neurological:  Negative for speech difficulty and weakness.     Objective:    Physical Exam Vitals and nursing note reviewed.  Constitutional:      General: She is not in acute distress.    Appearance: Normal appearance. She is not ill-appearing, toxic-appearing or diaphoretic.  HENT:     Head: Normocephalic and atraumatic.     Right Ear: External ear normal.     Left Ear: External ear normal.     Mouth/Throat:     Mouth: Mucous membranes are dry.     Pharynx: Oropharynx is clear. No oropharyngeal exudate or posterior oropharyngeal erythema.  Eyes:     General: No scleral icterus.       Right eye: No discharge.        Left eye: No discharge.     Extraocular Movements: Extraocular movements intact.     Conjunctiva/sclera:  Conjunctivae normal.     Pupils: Pupils are equal, round, and reactive to light.  Neck:     Vascular: No carotid bruit.  Cardiovascular:     Rate and Rhythm: Normal rate and regular rhythm.  Pulmonary:     Effort: Pulmonary effort is normal.     Breath sounds: Normal breath sounds.  Abdominal:     General: Bowel sounds are normal.  Musculoskeletal:     Cervical back: No rigidity or tenderness.     Right lower leg: No edema.     Left lower leg: No edema.  Lymphadenopathy:     Cervical: No cervical adenopathy.  Skin:    General: Skin is warm and dry.  Neurological:     Mental Status: She is alert and oriented to person, place, and time.  Psychiatric:        Mood and Affect: Mood normal.        Behavior: Behavior normal.    BP 128/66 (BP Location: Right Arm, Patient Position: Sitting, Cuff Size: Normal)    Pulse 68    Temp (!) 97 F (36.1 C) (Temporal)    Ht '5\' 7"'  (1.702 m)    Wt 189 lb 9.6 oz (86 kg)    SpO2 97%    BMI 29.70 kg/m  Wt Readings from Last 3 Encounters:  03/24/21 189 lb 9.6 oz (86 kg)  02/27/21 185 lb (83.9 kg)  02/10/21 187 lb (84.8 kg)     Health Maintenance Due  Topic Date Due   Zoster Vaccines- Shingrix (1 of 2) Never done    There are no preventive care reminders to display for this patient.  Lab Results  Component Value Date   TSH 2.13 12/17/2020   Lab Results  Component  Value Date   WBC 7.3 12/17/2020   HGB 12.6 12/17/2020   HCT 37.9 12/17/2020   MCV 84.5 12/17/2020   PLT 309.0 12/17/2020   Lab Results  Component Value Date   NA 140 12/17/2020   K 4.0 12/17/2020   CO2 28 12/17/2020   GLUCOSE 118 (H) 12/17/2020   BUN 18 12/17/2020   CREATININE 1.14 12/17/2020   BILITOT 0.5 12/17/2020   ALKPHOS 69 12/17/2020   AST 14 12/17/2020   ALT 16 12/17/2020   PROT 7.0 12/17/2020   ALBUMIN 4.4 12/17/2020   CALCIUM 9.7 12/17/2020   ANIONGAP 10 02/02/2019   GFR 49.08 (L) 12/17/2020   Lab Results  Component Value Date   CHOL 114 03/26/2020    Lab Results  Component Value Date   HDL 41.50 03/26/2020   Lab Results  Component Value Date   LDLCALC 41 03/26/2020   Lab Results  Component Value Date   TRIG 154.0 (H) 03/26/2020   Lab Results  Component Value Date   CHOLHDL 3 03/26/2020   Lab Results  Component Value Date   HGBA1C 6.4 12/17/2020      Assessment & Plan:   Problem List Items Addressed This Visit       Endocrine   Diabetes mellitus, type 2 ([place]) - Primary   Relevant Orders   Basic metabolic panel   Hemoglobin A1c   Urinalysis, Routine w reflex microscopic   Microalbumin / creatinine urine ratio   Hypothyroidism   Relevant Orders   TSH     Genitourinary   Stage 3a chronic kidney disease (HCC)   Relevant Orders   Basic metabolic panel   Other Visit Diagnoses     Elevated LDL cholesterol level       Relevant Orders   LDL cholesterol, direct   Essential hypertension       Relevant Orders   CBC   Basic metabolic panel       No orders of the defined types were placed in this encounter.   Follow-up: Return in about 6 months (around 09/21/2021), or if symptoms worsen or fail to improve.  Continue current medications as well as [place] if affordable.  Libby Maw, MD

## 2021-03-27 ENCOUNTER — Other Ambulatory Visit (INDEPENDENT_AMBULATORY_CARE_PROVIDER_SITE_OTHER): Payer: Medicare Other

## 2021-03-27 ENCOUNTER — Ambulatory Visit (INDEPENDENT_AMBULATORY_CARE_PROVIDER_SITE_OTHER): Payer: Medicare Other | Admitting: *Deleted

## 2021-03-27 ENCOUNTER — Other Ambulatory Visit: Payer: Self-pay

## 2021-03-27 DIAGNOSIS — Z5181 Encounter for therapeutic drug level monitoring: Secondary | ICD-10-CM | POA: Diagnosis not present

## 2021-03-27 DIAGNOSIS — I4891 Unspecified atrial fibrillation: Secondary | ICD-10-CM | POA: Diagnosis not present

## 2021-03-27 DIAGNOSIS — E7800 Pure hypercholesterolemia, unspecified: Secondary | ICD-10-CM

## 2021-03-27 LAB — BASIC METABOLIC PANEL
BUN: 20 mg/dL (ref 6–23)
CO2: 30 mEq/L (ref 19–32)
Calcium: 9.7 mg/dL (ref 8.4–10.5)
Chloride: 105 mEq/L (ref 96–112)
Creatinine, Ser: 1.21 mg/dL — ABNORMAL HIGH (ref 0.40–1.20)
GFR: 45.61 mL/min — ABNORMAL LOW (ref >60.00)
Glucose, Bld: 111 mg/dL — ABNORMAL HIGH (ref 70–99)
Potassium: 3.4 mEq/L — ABNORMAL LOW (ref 3.5–5.1)
Sodium: 143 mEq/L (ref 135–145)

## 2021-03-27 LAB — URINALYSIS, ROUTINE W REFLEX MICROSCOPIC
Bilirubin Urine: NEGATIVE
Hgb urine dipstick: NEGATIVE
Ketones, ur: NEGATIVE
Leukocytes,Ua: NEGATIVE
Nitrite: NEGATIVE
Specific Gravity, Urine: 1.015 (ref 1.000–1.030)
Total Protein, Urine: NEGATIVE
Urine Glucose: >=1000 — AB
Urobilinogen, UA: 0.2 (ref 0.0–1.0)
pH: 6 (ref 5.0–8.0)

## 2021-03-27 LAB — HEMOGLOBIN A1C: Hgb A1c MFr Bld: 6.5 % (ref 4.6–6.5)

## 2021-03-27 LAB — LDL CHOLESTEROL, DIRECT: Direct LDL: 44 mg/dL

## 2021-03-27 LAB — CBC
HCT: 38 % (ref 36.0–46.0)
Hemoglobin: 12.4 g/dL (ref 12.0–15.0)
MCHC: 32.5 g/dL (ref 30.0–36.0)
MCV: 83.7 fl (ref 78.0–100.0)
Platelets: 371 10*3/uL (ref 150.0–400.0)
RBC: 4.54 Mil/uL (ref 3.87–5.11)
RDW: 15.1 % (ref 11.5–15.5)
WBC: 9 10*3/uL (ref 4.0–10.5)

## 2021-03-27 LAB — MICROALBUMIN / CREATININE URINE RATIO
Creatinine,U: 85.2 mg/dL
Microalb Creat Ratio: 3.3 mg/g (ref 0.0–30.0)
Microalb, Ur: 2.8 mg/dL — ABNORMAL HIGH (ref 0.0–1.9)

## 2021-03-27 LAB — TSH: TSH: 1.29 u[IU]/mL (ref 0.35–5.50)

## 2021-03-27 LAB — POCT INR: INR: 3.1 — AB (ref 2.0–3.0)

## 2021-03-27 NOTE — Patient Instructions (Signed)
Description   Start taking warfarin 1 tablet daily except for 1/2 a tablet on Monday, Wednesday and Fridays. Recheck INR in 1 week. [facility] [phone]

## 2021-03-28 ENCOUNTER — Encounter: Payer: Self-pay | Admitting: Family Medicine

## 2021-03-30 ENCOUNTER — Ambulatory Visit (INDEPENDENT_AMBULATORY_CARE_PROVIDER_SITE_OTHER): Payer: Medicare Other

## 2021-03-30 DIAGNOSIS — I4891 Unspecified atrial fibrillation: Secondary | ICD-10-CM | POA: Diagnosis not present

## 2021-03-30 LAB — CUP PACEART REMOTE DEVICE CHECK
Date Time Interrogation Session: 20230205233128
Implantable Pulse Generator Implant Date: 20191008

## 2021-04-02 NOTE — Progress Notes (Signed)
Carelink Summary Report / Loop Recorder 

## 2021-04-03 ENCOUNTER — Other Ambulatory Visit: Payer: Self-pay

## 2021-04-03 ENCOUNTER — Ambulatory Visit (INDEPENDENT_AMBULATORY_CARE_PROVIDER_SITE_OTHER): Payer: Medicare Other

## 2021-04-03 DIAGNOSIS — Z5181 Encounter for therapeutic drug level monitoring: Secondary | ICD-10-CM

## 2021-04-03 DIAGNOSIS — I4891 Unspecified atrial fibrillation: Secondary | ICD-10-CM

## 2021-04-03 LAB — POCT INR: INR: 2.9 (ref 2.0–3.0)

## 2021-04-03 NOTE — Patient Instructions (Signed)
Continue taking warfarin 1 tablet daily except for 1/2 a tablet on Monday, Wednesday and Fridays. Recheck INR in 2 weeks. [facility] Clinic [phone]

## 2021-04-17 ENCOUNTER — Ambulatory Visit (INDEPENDENT_AMBULATORY_CARE_PROVIDER_SITE_OTHER): Payer: Medicare Other | Admitting: *Deleted

## 2021-04-17 ENCOUNTER — Other Ambulatory Visit: Payer: Self-pay

## 2021-04-17 DIAGNOSIS — Z5181 Encounter for therapeutic drug level monitoring: Secondary | ICD-10-CM

## 2021-04-17 DIAGNOSIS — I4891 Unspecified atrial fibrillation: Secondary | ICD-10-CM

## 2021-04-17 LAB — POCT INR: INR: 1.9 — AB (ref 2.0–3.0)

## 2021-04-17 NOTE — Patient Instructions (Signed)
Description   Take 1 tablet today,  then Continue taking warfarin 1 tablet daily except for 1/2 a tablet on Monday, Wednesday and Fridays. Recheck INR in 2 weeks. [facility]in Clinic [phone]

## 2021-05-01 ENCOUNTER — Other Ambulatory Visit: Payer: Self-pay

## 2021-05-01 ENCOUNTER — Ambulatory Visit (INDEPENDENT_AMBULATORY_CARE_PROVIDER_SITE_OTHER): Payer: Medicare Other

## 2021-05-01 DIAGNOSIS — Z5181 Encounter for therapeutic drug level monitoring: Secondary | ICD-10-CM

## 2021-05-01 DIAGNOSIS — I4891 Unspecified atrial fibrillation: Secondary | ICD-10-CM | POA: Diagnosis not present

## 2021-05-01 LAB — POCT INR: INR: 2.4 (ref 2.0–3.0)

## 2021-05-01 NOTE — Patient Instructions (Signed)
-  Continue taking warfarin 1 tablet daily except for 1/2 a tablet on Monday, Wednesday and Fridays.  ?-Recheck INR in 3 weeks.  ?[facility]in Clinic [phone]  ?

## 2021-05-04 ENCOUNTER — Ambulatory Visit (INDEPENDENT_AMBULATORY_CARE_PROVIDER_SITE_OTHER): Payer: Medicare Other

## 2021-05-04 DIAGNOSIS — I4891 Unspecified atrial fibrillation: Secondary | ICD-10-CM | POA: Diagnosis not present

## 2021-05-04 LAB — CUP PACEART REMOTE DEVICE CHECK
Date Time Interrogation Session: 20230312231619
Implantable Pulse Generator Implant Date: 20191008

## 2021-05-07 ENCOUNTER — Other Ambulatory Visit: Payer: Self-pay | Admitting: Internal Medicine

## 2021-05-11 ENCOUNTER — Telehealth: Payer: Self-pay | Admitting: Family Medicine

## 2021-05-11 DIAGNOSIS — E119 Type 2 diabetes mellitus without complications: Secondary | ICD-10-CM

## 2021-05-11 DIAGNOSIS — I10 Essential (primary) hypertension: Secondary | ICD-10-CM

## 2021-05-11 MED ORDER — IRBESARTAN 300 MG PO TABS: 300.0000 mg | ORAL_TABLET | Freq: Every day | ORAL | 0 refills | Status: DC

## 2021-05-11 MED ORDER — TORSEMIDE 20 MG PO TABS: 20.0000 mg | ORAL_TABLET | Freq: Every day | ORAL | 0 refills | Status: DC

## 2021-05-11 NOTE — Telephone Encounter (Signed)
Caller Name: Marenda Parisien ?Call back phone #: [phone] ? ?MEDICATION(S): 1. torsemide (DEMADEX) 20 MG tablet ?2. irbesartan (AVAPRO) 300 MG tablet ? ? ?Days of Med Remaining: 2 left on torsemide and a week left on irbesartan ? ?Has the patient contacted their pharmacy (YES/NO)?  Yes, last prescribed by old provider and she has new pharmacy as well so a new script is needed ?IF YES, when and what did the pharmacy advise?  ?IF NO, request that the patient contact the pharmacy for the refills in the future.  ?           The pharmacy will send an electronic request (except for controlled medications). ? ?Preferred [facility]acy: [facility] Address: [address], [place], [place] [address] ? ?~~~Please advise patient/caregiver to allow 2-3 business days to process RX refills.  ?

## 2021-05-15 NOTE — Progress Notes (Signed)
Carelink Summary Report / Loop Recorder 

## 2021-05-20 ENCOUNTER — Ambulatory Visit (INDEPENDENT_AMBULATORY_CARE_PROVIDER_SITE_OTHER): Payer: Medicare Other

## 2021-05-20 DIAGNOSIS — Z0000 Encounter for general adult medical examination without abnormal findings: Secondary | ICD-10-CM | POA: Diagnosis not present

## 2021-05-20 NOTE — Progress Notes (Signed)
? ?Subjective:  ? Ashley Woodard is a 71 y.o. female who presents for Medicare Annual (Subsequent) preventive examination. ? ? ?I connected with Ashley Woodard today by telephone and verified that I am speaking with the correct person using two identifiers. ?Location patient: home ?Location provider: work ?Persons participating in the virtual visit: patient, provider. ?  ?I discussed the limitations, risks, security and privacy concerns of performing an evaluation and management service by telephone and the availability of in person appointments. I also discussed with the patient that there may be a patient responsible charge related to this service. The patient expressed understanding and verbally consented to this telephonic visit.  ?  ?Interactive audio and video telecommunications were attempted between this provider and patient, however failed, due to patient having technical difficulties OR patient did not have access to video capability.  We continued and completed visit with audio only. ? ?  ?Review of Systems    ? ?Cardiac Risk Factors include: advanced age (>77mn, >>52women);diabetes mellitus;dyslipidemia;hypertension ? ?   ?Objective:  ?  ?Today's Vitals  ? ?There is no height or weight on file to calculate BMI. ? ? ?  05/20/2021  ? 10:39 AM 01/14/2020  ?  3:52 PM 02/06/2019  ?  9:06 AM 01/31/2019  ? 11:20 AM 01/26/2019  ?  5:41 PM 01/05/2019  ?  2:06 PM 11/03/2018  ?  9:05 AM  ?Advanced Directives  ?Does Patient Have a Medical Advance Directive? Yes Yes Yes Yes Yes Yes Yes  ?Type of AParamed[place]of [place] will [place]Living will [place] will  [place]Living will [place] will Living will;Healthcare Power of Attorney  ?Does patient want to make changes to medical advance directive?     No - Patient declined    ?Copy of H[place]ascoin Chart? No - copy requested No - copy  requested No - copy requested  No - copy requested No - copy requested No - copy requested  ?Would patient like information on creating a medical advance directive?     No - Patient declined    ? ? ?Current Medications (verified) ?Outpatient Encounter Medications as of 05/20/2021  ?Medication Sig  ? amLODipine (NORVASC) 10 MG tablet Take 10 mg by mouth daily.  ? atorvastatin (LIPITOR) 10 MG tablet Take 10 mg by mouth daily.  ? Blood Glucose Calibration (ONETOUCH VERIO) SOLN 1 Act by In Vitro route in the morning and at bedtime.  ? Blood Glucose Monitoring Suppl (ONETOUCH VERIO FLEX SYSTEM) w/Device KIT 1 Act by Does not apply route in the morning and at bedtime.  ? dapagliflozin propanediol (FARXIGA) 10 MG TABS tablet Take 1 tablet (10 mg total) by mouth daily before breakfast.  ? doxylamine, Sleep, (UNISOM) 25 MG tablet Take 25 mg by mouth at bedtime.  ? DULoxetine (CYMBALTA) 30 MG capsule TAKE 1 CAPSULE BY MOUTH EVERY DAY  ? glucose blood (ONETOUCH VERIO) test strip 1 each by Other route in the morning and at bedtime. Use as instructed  ? hydrALAZINE (APRESOLINE) 50 MG tablet TAKE 1 TABLET BY MOUTH TWICE A DAY  ? irbesartan (AVAPRO) 300 MG tablet Take 1 tablet (300 mg total) by mouth daily.  ? Lancets (ONETOUCH ULTRASOFT) lancets 1 each by Other route in the morning and at bedtime. Use as instructed  ? levothyroxine (SYNTHROID) 88 MCG tablet TAKE 1 TABLET DAILY  ? Magnesium 200 MG TABS Take 200 mg  by mouth daily.  ? metFORMIN (GLUCOPHAGE) 500 MG tablet TAKE 1 TABLET BY MOUTH 2 TIMES DAILY WITH A MEAL.  ? OnabotulinumtoxinA (BOTOX IJ) Inject as directed as needed.  ? torsemide (DEMADEX) 20 MG tablet Take 1 tablet (20 mg total) by mouth daily.  ? warfarin (COUMADIN) 5 MG tablet Take 1 tablet (5 mg total) by mouth daily.  ? ?No facility-administered encounter medications on file as of 05/20/2021.  ? ? ?Allergies (verified) ?Amlodipine, Demeclocycline, Doxycycline, Erythromycin, Lisinopril, and Tetracyclines & related   ? ?History: ?Past Medical History:  ?Diagnosis Date  ? Abnormal cardiovascular stress test 08/21/2016  ? a. done for abnl EKG/cardiac risk factors -> admitted for cath 07/2016 which showed no significant CAD, normal LV contraction, mildly elevated filling pressure. Low dose Imdur was added for possible component of microvascular dysfunction.   ? Allergy   ? Anxiety   ? Arthritis   ? "back, knees, fingers" (05/10/2017)  ? Chronic lower back pain   ? Colon polyps   ? Deafness in right ear   ? Depression   ? Diverticulosis   ? Hx of adenomatous colonic polyps 05/08/2003  ? Hypertension   ? Hypertriglyceridemia   ? Hypothyroidism   ? Migraine   ? "none in the 2000s" (05/10/2017)  ? Osteoporosis   ? Paroxysmal atrial fibrillation (HCC)   ? Pneumonia   ? "several times" (05/10/2017)  ? PONV (postoperative nausea and vomiting)   ? S/P cardiac catheterization, 08/20/16 minimal CAD 08/21/2016  ? Type II diabetes mellitus ([place])   ? Voice tremor   ? ?Past Surgical History:  ?Procedure Laterality Date  ? ABDOMINAL HYSTERECTOMY  1988  ? ATRIAL FIBRILLATION ABLATION N/A 05/10/2017  ? Procedure: ATRIAL FIBRILLATION ABLATION;  Surgeon: Thompson Grayer, MD;  Location: [facility];  Service: Cardiovascular;  Laterality: N/A;  ? COCHLEAR IMPLANT Right 2008  ? COLONOSCOPY    ? LEFT HEART CATH AND CORONARY ANGIOGRAPHY N/A 08/20/2016  ? Procedure: Left Heart Cath and Coronary Angiography;  Surgeon: Nelva Bush, MD;  Location: [facility];  Service: Cardiovascular;  Laterality: N/A;  ? LOOP RECORDER INSERTION N/A 11/29/2017  ? Procedure: LOOP RECORDER INSERTION;  Surgeon: Thompson Grayer, MD;  Location: [facility];  Service: Cardiovascular;  Laterality: N/A;  ? ORIF ANKLE FRACTURE Right 02/06/2019  ? Procedure: OPEN REDUCTION INTERNAL FIXATION (ORIF) ANKLE FRACTURE LATERAL MALLEOLUS, SYNDESMOSIS, RIGHT;  Surgeon: Erle Crocker, MD;  Location: [facility];  Service: Orthopedics;  Laterality: Right;   SURGERY REQUEST TIME: 1.5 HOURS  ?CPT CODES: 04599, [id number], [id number], [id number]  ? ?Family History  ?Problem Relation Age of Onset  ? Hypertension Mother   ? Diabetes Mother   ? Heart attack Mother   ? Colon polyps Mother   ? Hypertension Father   ? Heart attack Father   ? Stroke Father   ? Breast cancer Sister 76  ? Hypertension Brother   ? Cancer Brother   ?     Prostate  ? Diabetes Brother   ? Dementia Brother   ? Stroke Brother   ? Heart attack Brother   ? Prostate cancer Brother   ? Hypertension Son   ? Breast cancer Maternal Aunt   ?     in 74's  ? Cancer Maternal Uncle   ?     Lung  ? Colon cancer Neg Hx   ? Esophageal cancer Neg Hx   ? Rectal cancer Neg Hx   ? Stomach cancer  Neg Hx   ? ?Social History  ? ?Socioeconomic History  ? Marital status: Married  ?  Spouse name: Not on file  ? Number of children: 2  ? Years of education: 30  ? Highest education level: Not on file  ?Occupational History  ? Occupation: Health and safety inspector  ?Tobacco Use  ? Smoking status: Never  ? Smokeless tobacco: Never  ?Vaping Use  ? Vaping Use: Never used  ?Substance and Sexual Activity  ? Alcohol use: No  ? Drug use: No  ? Sexual activity: Not Currently  ?Other Topics Concern  ? Not on file  ?Social History Narrative  ? Lives at home with husband in [place].  ? Right-handed.  ? No caffeine use.  ? Manages a call center  ? ?Social Determinants of Health  ? ?Financial Resource Strain: Low Risk   ? Difficulty of Paying Living Expenses: Not hard at all  ?Food Insecurity: No Food Insecurity  ? Worried About Charity fundraiser in the Last Year: Never true  ? Ran Out of Food in the Last Year: Never true  ?Transportation Needs: No Transportation Needs  ? Lack of Transportation (Medical): No  ? Lack of Transportation (Non-Medical): No  ?Physical Activity: Inactive  ? Days of Exercise per Week: 0 days  ? Minutes of Exercise per Session: 0 min  ?Stress: No Stress Concern Present  ? Feeling of Stress : Not at all  ?Social Connections: Moderately  Integrated  ? Frequency of Communication with Friends and Family: Twice a week  ? Frequency of Social Gatherings with Friends and Family: Twice a week  ? Attends Religious Services: Never  ? Active Member of [employer]

## 2021-05-20 NOTE — Patient Instructions (Signed)
[name] , ?Thank you for taking time to come for your Medicare Wellness Visit. I appreciate your ongoing commitment to your health goals. Please review the following plan we discussed and let me know if I can assist you in the future.  ? ?Screening recommendations/referrals: ?Colonoscopy: 10/06/2018 ?Mammogram: 10/17/2020 ?Bone Density: 08/26/2016 ?Recommended yearly ophthalmology/optometry visit for glaucoma screening and checkup ?Recommended yearly dental visit for hygiene and checkup ? ?Vaccinations: ?Influenza vaccine: completed  ?Pneumococcal vaccine: completed  ?Tdap vaccine: 03/13/2014 ?Shingles vaccine: will consider    ? ?Advanced directives: yes  ? ?Conditions/risks identified: none  ? ?Next appointment: none  ? ? ?Preventive Care 70 Years and Older, Female ?Preventive care refers to lifestyle choices and visits with your health care provider that can promote health and wellness. ?What does preventive care include? ?A yearly physical exam. This is also called an annual well check. ?Dental exams once or twice a year. ?Routine eye exams. Ask your health care provider how often you should have your eyes checked. ?Personal lifestyle choices, including: ?Daily care of your teeth and gums. ?Regular physical activity. ?Eating a healthy diet. ?Avoiding tobacco and drug use. ?Limiting alcohol use. ?Practicing safe sex. ?Taking low-dose aspirin every day. ?Taking vitamin and mineral supplements as recommended by your health care provider. ?What happens during an annual well check? ?The services and screenings done by your health care provider during your annual well check will depend on your age, overall health, lifestyle risk factors, and family history of disease. ?Counseling  ?Your health care provider may ask you questions about your: ?Alcohol use. ?Tobacco use. ?Drug use. ?Emotional well-being. ?Home and relationship well-being. ?Sexual activity. ?Eating habits. ?History of falls. ?Memory and ability to  understand (cognition). ?Work and work Astronomer. ?Reproductive health. ?Screening  ?You may have the following tests or measurements: ?Height, weight, and BMI. ?Blood pressure. ?Lipid and cholesterol levels. These may be checked every 5 years, or more frequently if you are over 55 years old. ?Skin check. ?Lung cancer screening. You may have this screening every year starting at age 70 if you have a 30-pack-year history of smoking and currently smoke or have quit within the past 15 years. ?Fecal occult blood test (FOBT) of the stool. You may have this test every year starting at age 70. ?Flexible sigmoidoscopy or colonoscopy. You may have a sigmoidoscopy every 5 years or a colonoscopy every 10 years starting at age 70. ?Hepatitis C blood test. ?Hepatitis B blood test. ?Sexually transmitted disease (STD) testing. ?Diabetes screening. This is done by checking your blood sugar (glucose) after you have not eaten for a while (fasting). You may have this done every 1-3 years. ?Bone density scan. This is done to screen for osteoporosis. You may have this done starting at age 70. ?Mammogram. This may be done every 1-2 years. Talk to your health care provider about how often you should have regular mammograms. ?Talk with your health care provider about your test results, treatment options, and if necessary, the need for more tests. ?Vaccines  ?Your health care provider may recommend certain vaccines, such as: ?Influenza vaccine. This is recommended every year. ?Tetanus, diphtheria, and acellular pertussis (Tdap, Td) vaccine. You may need a Td booster every 10 years. ?Zoster vaccine. You may need this after age 70. ?Pneumococcal 13-valent conjugate (PCV13) vaccine. One dose is recommended after age 70. ?Pneumococcal polysaccharide (PPSV23) vaccine. One dose is recommended after age 70. ?Talk to your health care provider about which screenings and vaccines you need and how often  you need them. ?This information is not  intended to replace advice given to you by your health care provider. Make sure you discuss any questions you have with your health care provider. ?Document Released: 03/07/2015 Document Revised: 10/29/2015 Document Reviewed: 12/10/2014 ?Elsevier Interactive Patient Education ? 2017 [employer]. ? ?Fall Prevention in the Home ?Falls can cause injuries. They can happen to people of all ages. There are many things you can do to make your home safe and to help prevent falls. ?What can I do on the outside of my home? ?Regularly fix the edges of walkways and driveways and fix any cracks. ?Remove anything that might make you trip as you walk through a door, such as a raised step or threshold. ?Trim any bushes or trees on the path to your home. ?Use bright outdoor lighting. ?Clear any walking paths of anything that might make someone trip, such as rocks or tools. ?Regularly check to see if handrails are loose or broken. Make sure that both sides of any steps have handrails. ?Any raised decks and porches should have guardrails on the edges. ?Have any leaves, snow, or ice cleared regularly. ?Use sand or salt on walking paths during winter. ?Clean up any spills in your garage right away. This includes oil or grease spills. ?What can I do in the bathroom? ?Use night lights. ?Install grab bars by the toilet and in the tub and shower. Do not use towel bars as grab bars. ?Use non-skid mats or decals in the tub or shower. ?If you need to sit down in the shower, use a plastic, non-slip stool. ?Keep the floor dry. Clean up any water that spills on the floor as soon as it happens. ?Remove soap buildup in the tub or shower regularly. ?Attach bath mats securely with double-sided non-slip rug tape. ?Do not have throw rugs and other things on the floor that can make you trip. ?What can I do in the bedroom? ?Use night lights. ?Make sure that you have a light by your bed that is easy to reach. ?Do not use any sheets or blankets that are  too big for your bed. They should not hang down onto the floor. ?Have a firm chair that has side arms. You can use this for support while you get dressed. ?Do not have throw rugs and other things on the floor that can make you trip. ?What can I do in the kitchen? ?Clean up any spills right away. ?Avoid walking on wet floors. ?Keep items that you use a lot in easy-to-reach places. ?If you need to reach something above you, use a strong step stool that has a grab bar. ?Keep electrical cords out of the way. ?Do not use floor polish or wax that makes floors slippery. If you must use wax, use non-skid floor wax. ?Do not have throw rugs and other things on the floor that can make you trip. ?What can I do with my stairs? ?Do not leave any items on the stairs. ?Make sure that there are handrails on both sides of the stairs and use them. Fix handrails that are broken or loose. Make sure that handrails are as Retter as the stairways. ?Check any carpeting to make sure that it is firmly attached to the stairs. Fix any carpet that is loose or worn. ?Avoid having throw rugs at the top or bottom of the stairs. If you do have throw rugs, attach them to the floor with carpet tape. ?Make sure that you have a light  switch at the top of the stairs and the bottom of the stairs. If you do not have them, ask someone to add them for you. ?What else can I do to help prevent falls? ?Wear shoes that: ?Do not have high heels. ?Have rubber bottoms. ?Are comfortable and fit you well. ?Are closed at the toe. Do not wear sandals. ?If you use a stepladder: ?Make sure that it is fully opened. Do not climb a closed stepladder. ?Make sure that both sides of the stepladder are locked into place. ?Ask someone to hold it for you, if possible. ?Clearly mark and make sure that you can see: ?Any grab bars or handrails. ?First and last steps. ?Where the edge of each step is. ?Use tools that help you move around (mobility aids) if they are needed. These  include: ?Canes. ?Walkers. ?Scooters. ?Crutches. ?Turn on the lights when you go into a dark area. Replace any light bulbs as soon as they burn out. ?Set up your furniture so you have a clear path. Avoid moving your furni

## 2021-05-22 ENCOUNTER — Ambulatory Visit (INDEPENDENT_AMBULATORY_CARE_PROVIDER_SITE_OTHER): Payer: Medicare Other | Admitting: *Deleted

## 2021-05-22 DIAGNOSIS — I4891 Unspecified atrial fibrillation: Secondary | ICD-10-CM

## 2021-05-22 DIAGNOSIS — Z5181 Encounter for therapeutic drug level monitoring: Secondary | ICD-10-CM

## 2021-05-22 LAB — POCT INR: INR: 2.5 (ref 2.0–3.0)

## 2021-05-22 NOTE — Patient Instructions (Signed)
Description   ?-Continue taking warfarin 1 tablet daily except for 1/2 a tablet on Monday, Wednesday and Fridays.  ?-Recheck INR in 4 weeks.  ?[facility] [phone]  ?  ? ? ?

## 2021-06-03 ENCOUNTER — Other Ambulatory Visit: Payer: Self-pay | Admitting: Family Medicine

## 2021-06-03 DIAGNOSIS — E119 Type 2 diabetes mellitus without complications: Secondary | ICD-10-CM

## 2021-06-03 DIAGNOSIS — I10 Essential (primary) hypertension: Secondary | ICD-10-CM

## 2021-06-05 ENCOUNTER — Other Ambulatory Visit: Payer: Self-pay | Admitting: Family Medicine

## 2021-06-05 DIAGNOSIS — E119 Type 2 diabetes mellitus without complications: Secondary | ICD-10-CM

## 2021-06-05 DIAGNOSIS — I10 Essential (primary) hypertension: Secondary | ICD-10-CM

## 2021-06-08 ENCOUNTER — Ambulatory Visit (INDEPENDENT_AMBULATORY_CARE_PROVIDER_SITE_OTHER): Payer: Medicare Other

## 2021-06-08 DIAGNOSIS — I4891 Unspecified atrial fibrillation: Secondary | ICD-10-CM | POA: Diagnosis not present

## 2021-06-08 LAB — CUP PACEART REMOTE DEVICE CHECK
Date Time Interrogation Session: 20230414231240
Implantable Pulse Generator Implant Date: 20191008

## 2021-06-17 ENCOUNTER — Other Ambulatory Visit: Payer: Self-pay | Admitting: Family Medicine

## 2021-06-17 DIAGNOSIS — F3342 Major depressive disorder, recurrent, in full remission: Secondary | ICD-10-CM

## 2021-06-17 NOTE — Telephone Encounter (Signed)
Please advise refill request for pending Rx. Last OV 03/11/21 ?

## 2021-06-17 NOTE — Telephone Encounter (Signed)
Pt needs atorvastatin and her Duloxetine refilled. She is out of both. ?

## 2021-06-18 MED ORDER — ATORVASTATIN CALCIUM 10 MG PO TABS
10.0000 mg | ORAL_TABLET | Freq: Every day | ORAL | 3 refills | Status: DC
Start: 2021-06-18 — End: 2022-06-04

## 2021-06-18 MED ORDER — DULOXETINE HCL 30 MG PO CPEP: ORAL_CAPSULE | ORAL | 0 refills | Status: DC

## 2021-06-19 ENCOUNTER — Ambulatory Visit (INDEPENDENT_AMBULATORY_CARE_PROVIDER_SITE_OTHER): Payer: Medicare Other | Admitting: *Deleted

## 2021-06-19 ENCOUNTER — Encounter: Payer: Self-pay | Admitting: Family Medicine

## 2021-06-19 ENCOUNTER — Ambulatory Visit (INDEPENDENT_AMBULATORY_CARE_PROVIDER_SITE_OTHER): Payer: Medicare Other

## 2021-06-19 ENCOUNTER — Ambulatory Visit (INDEPENDENT_AMBULATORY_CARE_PROVIDER_SITE_OTHER): Payer: Medicare Other | Admitting: Family Medicine

## 2021-06-19 ENCOUNTER — Other Ambulatory Visit: Payer: Self-pay | Admitting: Family Medicine

## 2021-06-19 VITALS — BP 122/66 | HR 68 | Temp 97.3°F | Ht 67.0 in | Wt 189.6 lb

## 2021-06-19 DIAGNOSIS — M25522 Pain in left elbow: Secondary | ICD-10-CM

## 2021-06-19 DIAGNOSIS — E119 Type 2 diabetes mellitus without complications: Secondary | ICD-10-CM | POA: Diagnosis not present

## 2021-06-19 DIAGNOSIS — I10 Essential (primary) hypertension: Secondary | ICD-10-CM | POA: Diagnosis not present

## 2021-06-19 DIAGNOSIS — M19079 Primary osteoarthritis, unspecified ankle and foot: Secondary | ICD-10-CM

## 2021-06-19 DIAGNOSIS — N1831 Chronic kidney disease, stage 3a: Secondary | ICD-10-CM

## 2021-06-19 DIAGNOSIS — I4891 Unspecified atrial fibrillation: Secondary | ICD-10-CM | POA: Diagnosis not present

## 2021-06-19 DIAGNOSIS — Z5181 Encounter for therapeutic drug level monitoring: Secondary | ICD-10-CM | POA: Diagnosis not present

## 2021-06-19 DIAGNOSIS — F3342 Major depressive disorder, recurrent, in full remission: Secondary | ICD-10-CM

## 2021-06-19 DIAGNOSIS — M5136 Other intervertebral disc degeneration, lumbar region: Secondary | ICD-10-CM

## 2021-06-19 DIAGNOSIS — M7989 Other specified soft tissue disorders: Secondary | ICD-10-CM | POA: Diagnosis not present

## 2021-06-19 LAB — POCT INR: INR: 2.1 (ref 2.0–3.0)

## 2021-06-19 MED ORDER — DICLOFENAC SODIUM 1 % EX GEL: CUTANEOUS | 2 refills | Status: DC

## 2021-06-19 MED ORDER — DULOXETINE HCL 60 MG PO CPEP: 60.0000 mg | ORAL_CAPSULE | Freq: Every day | ORAL | 3 refills | Status: DC

## 2021-06-19 NOTE — Patient Instructions (Signed)
Description   ?Continue taking warfarin 1 tablet daily except for 1/2 tablet on Monday, Wednesday and Fridays. Recheck INR in 5 weeks. [facility] [phone]  ?  ?  ?

## 2021-06-19 NOTE — Progress Notes (Addendum)
? ?Established Patient Office Visit ? ?Subjective   ?Patient ID: Ashley Woodard, female    DOB: 08-10-1950  Age: 71 y.o. MRN: [id number] ? ?Chief Complaint  ?Patient presents with  ? Follow-up  ?  Follow up on BP and labs, also states that she has joint pains. Patient not fasting.   ? ? ?HPI follow-up of hypertension diabetes, CKD and joint aches and pains.  History of severe OA in the lower back.  She had done well on 60 mg of duloxetine for this problem but was reduced to 30 when it became quiescent.  Back is bothering him again and would like to increase dose..  She is having joint pain at the base of her right thumb and left medial elbow.  No specific injuries right-hand-dominant.  Blood pressure controlled with amlodipine, hydralazine irbesartan and torsemide.  Currently taking metformin only for diabetes.  Does not want to take the Marcelline Deist because her diabetes seems to be well controlled without it.  Cost is an issue as well. ? ? ? ?Review of Systems  ?Constitutional:  Negative for chills, diaphoresis, malaise/fatigue and weight loss.  ?HENT: Negative.    ?Eyes: Negative.  Negative for blurred vision and double vision.  ?Cardiovascular:  Negative for chest pain.  ?Gastrointestinal:  Negative for abdominal pain.  ?Genitourinary: Negative.   ?Musculoskeletal:  Positive for back pain and joint pain. Negative for falls and myalgias.  ?Neurological:  Negative for speech change, loss of consciousness and weakness.  ?Psychiatric/Behavioral: Negative.    ? ?  ? ?  06/19/2021  ?  1:16 PM 05/20/2021  ? 10:39 AM 05/20/2021  ? 10:38 AM  ?Depression screen PHQ 2/9  ?Decreased Interest 0 0 0  ?Down, Depressed, Hopeless 0 0 0  ?PHQ - 2 Score 0 0 0  ? ? ? ?Objective:  ?  ? ?BP 122/66 (BP Location: Right Arm, Patient Position: Sitting, Cuff Size: Large)   Pulse 68   Temp (!) 97.3 ?F (36.3 ?C) (Temporal)   Ht 5\' 7"  (1.702 m)   Wt 189 lb 9.6 oz (86 kg)   SpO2 96%   BMI 29.70 kg/m?  ? ? ?Physical Exam ?Constitutional:   ?    General: She is not in acute distress. ?   Appearance: Normal appearance. She is not ill-appearing, toxic-appearing or diaphoretic.  ?HENT:  ?   Head: Normocephalic and atraumatic.  ?   Right Ear: External ear normal.  ?   Left Ear: External ear normal.  ?Eyes:  ?   General: No scleral icterus.    ?   Right eye: No discharge.     ?   Left eye: No discharge.  ?   Extraocular Movements: Extraocular movements intact.  ?   Conjunctiva/sclera: Conjunctivae normal.  ?Cardiovascular:  ?   Rate and Rhythm: Normal rate and regular rhythm.  ?Pulmonary:  ?   Effort: Pulmonary effort is normal. No respiratory distress.  ?   Breath sounds: Normal breath sounds.  ?Abdominal:  ?   General: Bowel sounds are normal.  ?Musculoskeletal:  ?   Left elbow: No swelling or deformity. Normal range of motion. Tenderness present in medial epicondyle.  ?     Arms: ? ?Skin: ?   General: Skin is warm and dry.  ?Neurological:  ?   Mental Status: She is alert and oriented to person, place, and time.  ?Psychiatric:     ?   Mood and Affect: Mood normal.     ?  Behavior: Behavior normal.  ? ? ? ?Results for orders placed or performed in visit on 06/19/21  ?Basic metabolic panel  ?Result Value Ref Range  ? Glucose, Bld 86 65 - 99 mg/dL  ? BUN 21 7 - 25 mg/dL  ? Creat 1.23 (H) 0.60 - 1.00 mg/dL  ? BUN/Creatinine Ratio 17 6 - 22 (calc)  ? Sodium 143 135 - 146 mmol/L  ? Potassium 3.5 3.5 - 5.3 mmol/L  ? Chloride 106 98 - 110 mmol/L  ? CO2 25 20 - 32 mmol/L  ? Calcium 9.6 8.6 - 10.4 mg/dL  ?Results for orders placed or performed in visit on 06/19/21  ?POCT INR  ?Result Value Ref Range  ? INR 2.1 2.0 - 3.0  ? ? ? ? ?The ASCVD Risk score (Arnett DK, et al., 2019) failed to calculate for the following reasons: ?  The valid total cholesterol range is 130 to 320 mg/dL ? ?  ?Assessment & Plan:  ? ?Problem List Items Addressed This Visit   ? ?  ? Cardiovascular and Mediastinum  ? Primary hypertension - Primary  ? Relevant Medications  ? amLODipine (NORVASC) 10  MG tablet  ? Other Relevant Orders  ? Basic metabolic panel (Completed)  ?  ? Endocrine  ? Type 2 diabetes mellitus without complication, without long-term current use of insulin (HCC)  ? Relevant Orders  ? Basic metabolic panel (Completed)  ?  ? Musculoskeletal and Integument  ? DDD (degenerative disc disease), lumbar  ? Relevant Medications  ? DULoxetine (CYMBALTA) 60 MG capsule  ? 1st MTP arthritis  ? Relevant Medications  ? diclofenac Sodium (VOLTAREN) 1 % GEL  ?  ? Genitourinary  ? Stage 3a chronic kidney disease (HCC)  ? Relevant Orders  ? Basic metabolic panel (Completed)  ?  ? Other  ? Left elbow pain  ? Relevant Orders  ? DG Elbow Complete Left (Completed)  ? ? ?Return in about 4 months (around 10/19/2021).  ?We will use Voltaren gel for MTP and elbow pain.  Have increased duloxetine to 60 mg for back pain.  Advised patient to take Marcelline Deist for renal benefits and nothing else.  We will continue to hydrate well and continue all other current medicines ? ?Mliss Sax, MD ? ?

## 2021-06-20 LAB — BASIC METABOLIC PANEL
BUN/Creatinine Ratio: 17 (calc) (ref 6–22)
BUN: 21 mg/dL (ref 7–25)
CO2: 25 mmol/L (ref 20–32)
Calcium: 9.6 mg/dL (ref 8.6–10.4)
Chloride: 106 mmol/L (ref 98–110)
Creat: 1.23 mg/dL — ABNORMAL HIGH (ref 0.60–1.00)
Glucose, Bld: 86 mg/dL (ref 65–99)
Potassium: 3.5 mmol/L (ref 3.5–5.3)
Sodium: 143 mmol/L (ref 135–146)

## 2021-06-24 ENCOUNTER — Other Ambulatory Visit: Payer: Self-pay | Admitting: Family Medicine

## 2021-06-24 ENCOUNTER — Other Ambulatory Visit: Payer: Self-pay | Admitting: Internal Medicine

## 2021-06-24 DIAGNOSIS — E039 Hypothyroidism, unspecified: Secondary | ICD-10-CM

## 2021-06-24 NOTE — Progress Notes (Signed)
Carelink Summary Report / Loop Recorder 

## 2021-06-26 MED ORDER — LEVOTHYROXINE SODIUM 88 MCG PO TABS: 88.0000 ug | ORAL_TABLET | Freq: Every day | ORAL | 2 refills | Status: DC

## 2021-06-30 MED ORDER — AMLODIPINE BESYLATE 10 MG PO TABS: 10.0000 mg | ORAL_TABLET | Freq: Every day | ORAL | 1 refills | Status: DC

## 2021-06-30 NOTE — Addendum Note (Signed)
Addended by: Andrez Grime on: 06/30/2021 07:32 AM ? ? Modules accepted: Orders ? ?

## 2021-07-07 ENCOUNTER — Telehealth: Payer: Self-pay | Admitting: Family Medicine

## 2021-07-07 NOTE — Telephone Encounter (Signed)
Pt has moved and needs her Levothyroxine sent to [facility] in [place]. ?

## 2021-07-08 ENCOUNTER — Other Ambulatory Visit: Payer: Self-pay

## 2021-07-08 DIAGNOSIS — E039 Hypothyroidism, unspecified: Secondary | ICD-10-CM

## 2021-07-08 MED ORDER — LEVOTHYROXINE SODIUM 88 MCG PO TABS: 88.0000 ug | ORAL_TABLET | Freq: Every day | ORAL | 2 refills | Status: DC

## 2021-07-08 NOTE — Telephone Encounter (Signed)
Refill sent.

## 2021-07-08 NOTE — Telephone Encounter (Signed)
Called patient yesterday asking about pharmacy change due to requested prescription already have been sent through mail delivery on 06/26/21. Need clarification of this Rx tried returning call, no answer LMTCB ?

## 2021-07-08 NOTE — Telephone Encounter (Signed)
Pt is checking on status of this message.  °

## 2021-07-10 LAB — CUP PACEART REMOTE DEVICE CHECK
Date Time Interrogation Session: 20230517232050
Implantable Pulse Generator Implant Date: 20191008

## 2021-07-13 ENCOUNTER — Ambulatory Visit (INDEPENDENT_AMBULATORY_CARE_PROVIDER_SITE_OTHER): Payer: Medicare Other

## 2021-07-13 DIAGNOSIS — I4891 Unspecified atrial fibrillation: Secondary | ICD-10-CM

## 2021-07-24 ENCOUNTER — Ambulatory Visit (INDEPENDENT_AMBULATORY_CARE_PROVIDER_SITE_OTHER): Payer: Medicare Other

## 2021-07-24 DIAGNOSIS — I4891 Unspecified atrial fibrillation: Secondary | ICD-10-CM | POA: Diagnosis not present

## 2021-07-24 LAB — POCT INR: INR: 3.1 — AB (ref 2.0–3.0)

## 2021-07-24 NOTE — Patient Instructions (Signed)
Description   Take 1/2 tablet tomorrow, then resume same dosage of Warfarin 1 tablet daily except for 1/2 tablet on Mondays, Wednesdays and Fridays. Recheck INR in 5 weeks. [facility] [phone]

## 2021-07-29 NOTE — Progress Notes (Signed)
Carelink Summary Report / Loop Recorder 

## 2021-08-01 ENCOUNTER — Other Ambulatory Visit: Payer: Self-pay | Admitting: Family Medicine

## 2021-08-01 DIAGNOSIS — I10 Essential (primary) hypertension: Secondary | ICD-10-CM

## 2021-08-10 ENCOUNTER — Telehealth: Payer: Medicare Other | Admitting: Physician Assistant

## 2021-08-10 DIAGNOSIS — R3989 Other symptoms and signs involving the genitourinary system: Secondary | ICD-10-CM | POA: Diagnosis not present

## 2021-08-10 MED ORDER — PHENAZOPYRIDINE HCL 100 MG PO TABS: 100.0000 mg | ORAL_TABLET | Freq: Three times a day (TID) | ORAL | 0 refills | Status: DC | PRN

## 2021-08-10 MED ORDER — CEPHALEXIN 500 MG PO CAPS: 500.0000 mg | ORAL_CAPSULE | Freq: Two times a day (BID) | ORAL | 0 refills | Status: DC

## 2021-08-10 NOTE — Progress Notes (Signed)
E-Visit for Urinary Problems  We are sorry that you are not feeling well.  Here is how we plan to help!  Based on what you shared with me it looks like you most likely have a simple urinary tract infection.  A UTI (Urinary Tract Infection) is a bacterial infection of the bladder.  Most cases of urinary tract infections are simple to treat but a [place] part of your care is to encourage you to drink plenty of fluids and watch your symptoms carefully.  I have prescribed Keflex 500 mg twice a day for 7 days.  Pyridium 100mg  for pain. Take 1 tablet every 8 hours as needed for pain. Your symptoms should gradually improve. Call if the burning in your urine worsens, you develop worsening fever, back pain or pelvic pain or if your symptoms do not resolve after completing the antibiotic.  Urinary tract infections can be prevented by drinking plenty of water to keep your body hydrated.  Also be sure when you wipe, wipe from front to back and don't hold it in!  If possible, empty your bladder every 4 hours.  HOME CARE Drink plenty of fluids Compete the full course of the antibiotics even if the symptoms resolve Remember, when you need to go.go. Holding in your urine can increase the likelihood of getting a UTI! GET HELP RIGHT AWAY IF: You cannot urinate You get a high fever Worsening back pain occurs You see blood in your urine You feel sick to your stomach or throw up You feel like you are going to pass out  MAKE SURE YOU  Understand these instructions. Will watch your condition. Will get help right away if you are not doing well or get worse.   Thank you for choosing an e-visit.  Your e-visit answers were reviewed by a board certified advanced clinical practitioner to complete your personal care plan. Depending upon the condition, your plan could have included both over the counter or prescription medications.  Please review your pharmacy choice. Make sure the pharmacy is open so you can  pick up prescription now. If there is a problem, you may contact your provider through [place] and have the prescription routed to another pharmacy.  Your safety is important to [employer]. If you have drug allergies check your prescription carefully.   For the next 24 hours you can use MyChart to ask questions about today's visit, request a non-urgent call back, or ask for a work or school excuse. You will get an email in the next two days asking about your experience. I hope that your e-visit has been valuable and will speed your recovery.  I provided 5 minutes of non face-to-face time during this encounter for chart review and documentation.

## 2021-08-11 ENCOUNTER — Telehealth: Payer: Self-pay | Admitting: Internal Medicine

## 2021-08-11 NOTE — Telephone Encounter (Signed)
Calling to make aware that she finishes her ABT on Sunday (6/25) and was told to make appt. She has appt scheduled for 08/17/21. Please advise

## 2021-08-11 NOTE — Telephone Encounter (Signed)
Returned call to the pt and she is taking cephalexin and pyridium-neither interact with warfarin, therefore, pt does not need an appt any sooner. Pt is aware that if she gets any other meds to call back. Canceled the appt for next week that was just made and pt will keep 08/28/21 appt.

## 2021-08-12 LAB — CUP PACEART REMOTE DEVICE CHECK
Date Time Interrogation Session: 20230619232447
Implantable Pulse Generator Implant Date: 20191008

## 2021-08-14 ENCOUNTER — Ambulatory Visit (INDEPENDENT_AMBULATORY_CARE_PROVIDER_SITE_OTHER): Payer: Medicare Other

## 2021-08-14 DIAGNOSIS — I4891 Unspecified atrial fibrillation: Secondary | ICD-10-CM | POA: Diagnosis not present

## 2021-08-18 ENCOUNTER — Ambulatory Visit: Payer: Medicare Other

## 2021-08-19 ENCOUNTER — Ambulatory Visit
Admission: EM | Admit: 2021-08-19 | Discharge: 2021-08-19 | Disposition: A | Discharge status: Home / Self Care | Payer: Medicare Other | Attending: Urgent Care | Admitting: Urgent Care

## 2021-08-19 ENCOUNTER — Telehealth: Payer: Medicare Other | Admitting: Physician Assistant

## 2021-08-19 ENCOUNTER — Encounter: Payer: Self-pay | Admitting: Emergency Medicine

## 2021-08-19 DIAGNOSIS — N183 Chronic kidney disease, stage 3 unspecified: Secondary | ICD-10-CM | POA: Insufficient documentation

## 2021-08-19 DIAGNOSIS — R300 Dysuria: Secondary | ICD-10-CM

## 2021-08-19 DIAGNOSIS — N3000 Acute cystitis without hematuria: Secondary | ICD-10-CM | POA: Diagnosis not present

## 2021-08-19 DIAGNOSIS — I4891 Unspecified atrial fibrillation: Secondary | ICD-10-CM

## 2021-08-19 LAB — POCT URINALYSIS DIP (MANUAL ENTRY)
Bilirubin, UA: NEGATIVE
Blood, UA: NEGATIVE
Glucose, UA: NEGATIVE mg/dL
Ketones, POC UA: NEGATIVE mg/dL
Leukocytes, UA: NEGATIVE
Nitrite, UA: POSITIVE — AB
Protein Ur, POC: NEGATIVE mg/dL
Spec Grav, UA: 1.02 (ref 1.010–1.025)
Urobilinogen, UA: 0.2 E.U./dL
pH, UA: 5.5 (ref 5.0–8.0)

## 2021-08-19 MED ORDER — FOSFOMYCIN TROMETHAMINE 3 G PO PACK: 3.0000 g | PACK | Freq: Once | ORAL | 0 refills | Status: AC

## 2021-08-19 NOTE — ED Provider Notes (Signed)
[facility]   MRN: [id number] DOB: [birth year]  Subjective:   Ashley Woodard is a 71 y.o. female presenting for 10-day history of acute onset persistent and recurrent urinary frequency, urinary urgency, dysuria.  Patient was seen for this and treated with Keflex.  Still has symptoms.  No urine culture results available.  Patient does have CKD stage III.  She is also on Coumadin for anticoagulation secondary to her atrial fibrillation.  No current facility-administered medications for this encounter.  Current Outpatient Medications:    amLODipine (NORVASC) 10 MG tablet, Take 1 tablet (10 mg total) by mouth daily., Disp: 90 tablet, Rfl: 1   atorvastatin (LIPITOR) 10 MG tablet, Take 1 tablet (10 mg total) by mouth daily., Disp: 90 tablet, Rfl: 3   Blood Glucose Calibration (ONETOUCH VERIO) SOLN, 1 Act by In Vitro route in the morning and at bedtime., Disp: 1 each, Rfl: 5   Blood Glucose Monitoring Suppl (ONETOUCH VERIO FLEX SYSTEM) w/Device KIT, 1 Act by Does not apply route in the morning and at bedtime., Disp: 1 kit, Rfl: 3   cephALEXin (KEFLEX) 500 MG capsule, Take 1 capsule (500 mg total) by mouth 2 (two) times daily., Disp: 14 capsule, Rfl: 0   dapagliflozin propanediol (FARXIGA) 10 MG TABS tablet, Take 1 tablet (10 mg total) by mouth daily before breakfast. (Patient not taking: Reported on 06/19/2021), Disp: 90 tablet, Rfl: 1   diclofenac Sodium (VOLTAREN) 1 % GEL, Apply a small grape sized dollop to tender to meet up to 4 times daily as needed, Disp: 150 g, Rfl: 2   doxylamine, Sleep, (UNISOM) 25 MG tablet, Take 25 mg by mouth at bedtime., Disp: , Rfl:    DULoxetine (CYMBALTA) 60 MG capsule, Take 1 capsule (60 mg total) by mouth daily., Disp: 90 capsule, Rfl: 3   glucose blood (ONETOUCH VERIO) test strip, 1 each by Other route in the morning and at bedtime. Use as instructed, Disp: 100 each, Rfl: 12   hydrALAZINE (APRESOLINE) 50 MG tablet, TAKE 1 TABLET BY MOUTH TWICE A  DAY, Disp: 180 tablet, Rfl: 0   irbesartan (AVAPRO) 300 MG tablet, TAKE 1 TABLET BY MOUTH EVERY DAY, Disp: 90 tablet, Rfl: 0   Lancets (ONETOUCH ULTRASOFT) lancets, 1 each by Other route in the morning and at bedtime. Use as instructed, Disp: 100 each, Rfl: 5   levothyroxine (SYNTHROID) 88 MCG tablet, Take 1 tablet (88 mcg total) by mouth daily., Disp: 90 tablet, Rfl: 2   Magnesium 200 MG TABS, Take 200 mg by mouth daily. (Patient not taking: Reported on 06/19/2021), Disp: , Rfl:    metFORMIN (GLUCOPHAGE) 500 MG tablet, TAKE 1 TABLET BY MOUTH TWICE A DAY WITH MEALS, Disp: 180 tablet, Rfl: 0   OnabotulinumtoxinA (BOTOX IJ), Inject as directed as needed., Disp: , Rfl:    phenazopyridine (PYRIDIUM) 100 MG tablet, Take 1 tablet (100 mg total) by mouth 3 (three) times daily as needed for pain., Disp: 10 tablet, Rfl: 0   torsemide (DEMADEX) 20 MG tablet, TAKE 1 TABLET BY MOUTH EVERY DAY, Disp: 90 tablet, Rfl: 0   warfarin (COUMADIN) 5 MG tablet, Take 1 tablet (5 mg total) by mouth daily., Disp: 30 tablet, Rfl: 11   Allergies  Allergen Reactions   Amlodipine Swelling    Ankle edema   Demeclocycline Rash   Doxycycline Rash   Erythromycin Rash   Lisinopril Cough   Tetracyclines & Related Rash    Past Medical History:  Diagnosis Date  Abnormal cardiovascular stress test 08/21/2016   a. done for abnl EKG/cardiac risk factors -> admitted for cath 07/2016 which showed no significant CAD, normal LV contraction, mildly elevated filling pressure. Low dose Imdur was added for possible component of microvascular dysfunction.    Allergy    Anxiety    Arthritis    "back, knees, fingers" (05/10/2017)   Chronic lower back pain    Colon polyps    Deafness in right ear    Depression    Diverticulosis    Hx of adenomatous colonic polyps 05/08/2003   Hypertension    Hypertriglyceridemia    Hypothyroidism    Migraine    "none in the 2000s" (05/10/2017)   Osteoporosis    Paroxysmal atrial fibrillation  (HCC)    Pneumonia    "several times" (05/10/2017)   PONV (postoperative nausea and vomiting)    S/P cardiac catheterization, 08/20/16 minimal CAD 08/21/2016   Type II diabetes mellitus ([place])    Voice tremor      Past Surgical History:  Procedure Laterality Date   ABDOMINAL HYSTERECTOMY  1988   ATRIAL FIBRILLATION ABLATION N/A 05/10/2017   Procedure: ATRIAL FIBRILLATION ABLATION;  Surgeon: Thompson Grayer, MD;  Location: [facility];  Service: Cardiovascular;  Laterality: N/A;   COCHLEAR IMPLANT Right 2008   COLONOSCOPY     LEFT HEART CATH AND CORONARY ANGIOGRAPHY N/A 08/20/2016   Procedure: Left Heart Cath and Coronary Angiography;  Surgeon: Nelva Bush, MD;  Location: [facility];  Service: Cardiovascular;  Laterality: N/A;   LOOP RECORDER INSERTION N/A 11/29/2017   Procedure: LOOP RECORDER INSERTION;  Surgeon: Thompson Grayer, MD;  Location: [facility];  Service: Cardiovascular;  Laterality: N/A;   ORIF ANKLE FRACTURE Right 02/06/2019   Procedure: OPEN REDUCTION INTERNAL FIXATION (ORIF) ANKLE FRACTURE LATERAL MALLEOLUS, SYNDESMOSIS, RIGHT;  Surgeon: Erle Crocker, MD;  Location: [facility];  Service: Orthopedics;  Laterality: Right;  SURGERY REQUEST TIME: 1.5 HOURS  CPT CODES: 09628, 27829, [phone], 27610    Family History  Problem Relation Age of Onset   Hypertension Mother    Diabetes Mother    Heart attack Mother    Colon polyps Mother    Hypertension Father    Heart attack Father    Stroke Father    Breast cancer Sister 32   Hypertension Brother    Cancer Brother        Prostate   Diabetes Brother    Dementia Brother    Stroke Brother    Heart attack Brother    Prostate cancer Brother    Hypertension Son    Breast cancer Maternal Aunt        in 30's   Cancer Maternal Uncle        Lung   Colon cancer Neg Hx    Esophageal cancer Neg Hx    Rectal cancer Neg Hx    Stomach cancer Neg Hx     Social History   Tobacco Use    Smoking status: Never   Smokeless tobacco: Never  Vaping Use   Vaping Use: Never used  Substance Use Topics   Alcohol use: No   Drug use: No    ROS   Objective:   Vitals: BP (!) 169/76   Pulse 67   Temp 97.9 F (36.6 C)   Resp 20   SpO2 98%   Physical Exam Constitutional:      General: She is not in acute distress.  Appearance: Normal appearance. She is well-developed. She is not ill-appearing, toxic-appearing or diaphoretic.  HENT:     Head: Normocephalic and atraumatic.     Nose: Nose normal.     Mouth/Throat:     Mouth: Mucous membranes are moist.  Eyes:     General: No scleral icterus.       Right eye: No discharge.        Left eye: No discharge.     Extraocular Movements: Extraocular movements intact.     Conjunctiva/sclera: Conjunctivae normal.  Cardiovascular:     Rate and Rhythm: Normal rate.  Pulmonary:     Effort: Pulmonary effort is normal.  Abdominal:     General: Bowel sounds are normal. There is no distension.     Palpations: Abdomen is soft. There is no mass.     Tenderness: There is no abdominal tenderness. There is no right CVA tenderness, left CVA tenderness, guarding or rebound.  Skin:    General: Skin is warm and dry.  Neurological:     General: No focal deficit present.     Mental Status: She is alert and oriented to person, place, and time.  Psychiatric:        Mood and Affect: Mood normal.        Behavior: Behavior normal.        Thought Content: Thought content normal.        Judgment: Judgment normal.    Results for orders placed or performed during the hospital encounter of 08/19/21 (from the past 24 hour(s))  POCT urinalysis dipstick     Status: Abnormal   Collection Time: 08/19/21 10:30 AM  Result Value Ref Range   Color, UA orange (A) yellow   Clarity, UA clear clear   Glucose, UA negative negative mg/dL   Bilirubin, UA negative negative   Ketones, POC UA negative negative mg/dL   Spec Grav, UA 1.020 1.010 - 1.025    Blood, UA negative negative   pH, UA 5.5 5.0 - 8.0   Protein Ur, POC negative negative mg/dL   Urobilinogen, UA 0.2 0.2 or 1.0 E.U./dL   Nitrite, UA Positive (A) Negative   Leukocytes, UA Negative Negative    Assessment and Plan :   PDMP not reviewed this encounter.  1. Acute cystitis without hematuria   2. Stage 3 chronic kidney disease, unspecified whether stage 3a or 3b CKD ([place])   3. Atrial fibrillation, unspecified type [facility])    Patient failed treatment with Keflex.  Due to medication interactions with Coumadin and her stage III CKD recommended fosfomycin.  Urine culture is pending. Counseled patient on potential for adverse effects with medications prescribed/recommended today, ER and return-to-clinic precautions discussed, patient verbalized understanding.    Jaynee Eagles, [place] 08/19/21 1148

## 2021-08-19 NOTE — Progress Notes (Signed)
Because you have failed a first-line antibiotic and still are having symptoms, I feel your condition warrants further evaluation and I recommend that you be seen in a face to face visit. We recommend for you to be seen so a urine specimen can be obtained and a culture sent to see if you have resistant bacteria, and if so, to what antibiotics it may be resistant.    NOTE: There will be NO CHARGE for this eVisit   If you are having a true medical emergency please call 911.      For an urgent face to face visit, [facility] has seven urgent care centers for your convenience:     [facility] at [facility] Directions [phone] [address] [place], [place] [address]    [facility]) Get Driving Directions [phone] [address] [place], [place] [address]  [facility]) Get Driving Directions [phone] [address] [place],  [place]  [address]  [facility] [employer] Directions  [phone] [address] [place],  [place] [address]   [facility] at [facility] Get Driving Directions [phone] [address] [place], [place] [address]   [facility] Health Urgent Care at [facility] Get Driving Directions  [phone] [address] [place], [place] [address]   [facility] Urgent Care at [facility] Directions [phone] [address] [place], [place] [address]  Your MyChart E-visit questionnaire answers were reviewed by a board certified advanced clinical practitioner to complete your personal care plan based on your specific symptoms.  Thank you for using e-Visits.   I provided 5 minutes of non face-to-face time during this encounter for chart review and documentation.

## 2021-08-19 NOTE — Progress Notes (Signed)
Carelink Summary Report / Loop Recorder 

## 2021-08-19 NOTE — ED Triage Notes (Signed)
Pt here with continuing urinary frequency, urgency and burning while urinating x 10 days. Pt completed abx tx on Sunday.

## 2021-08-19 NOTE — Discharge Instructions (Signed)
Please start fosfomycin to address an urinary tract infection. Make sure you hydrate very well with plain water and a quantity of 64 ounces of water a day.  Please limit drinks that are considered urinary irritants such as soda, sweet tea, coffee, energy drinks, alcohol.  These can worsen your urinary and genital symptoms but also be the source of them.  I will let you know about your urine culture results through MyChart to see if we need to prescribe or change your antibiotics based off of those results. 

## 2021-08-20 LAB — URINE CULTURE: Culture: <10000 — AB

## 2021-08-24 ENCOUNTER — Telehealth: Payer: Self-pay

## 2021-08-24 NOTE — Telephone Encounter (Signed)
Informed patient that her loop recorder had reached RRT, patient stated she will think about whether she would like to have the device taken out or left, patient agreeable to returning home monitor

## 2021-08-28 ENCOUNTER — Ambulatory Visit (INDEPENDENT_AMBULATORY_CARE_PROVIDER_SITE_OTHER): Payer: Medicare Other | Admitting: *Deleted

## 2021-08-28 DIAGNOSIS — I4891 Unspecified atrial fibrillation: Secondary | ICD-10-CM

## 2021-08-28 LAB — POCT INR: INR: 2.4 (ref 2.0–3.0)

## 2021-08-28 NOTE — Patient Instructions (Signed)
Description   Continue taking Warfarin 1 tablet daily except for 1/2 tablet on Mondays, Wednesdays and Fridays. Recheck INR in 6 weeks. Coumadin Clinic [phone]

## 2021-09-09 ENCOUNTER — Telehealth: Payer: Self-pay | Admitting: Family Medicine

## 2021-09-09 ENCOUNTER — Other Ambulatory Visit: Payer: Self-pay | Admitting: Family Medicine

## 2021-09-09 DIAGNOSIS — I10 Essential (primary) hypertension: Secondary | ICD-10-CM

## 2021-09-09 DIAGNOSIS — E119 Type 2 diabetes mellitus without complications: Secondary | ICD-10-CM

## 2021-09-09 MED ORDER — HYDRALAZINE HCL 50 MG PO TABS: 50.0000 mg | ORAL_TABLET | Freq: Two times a day (BID) | ORAL | 0 refills | Status: DC

## 2021-09-09 NOTE — Telephone Encounter (Signed)
Chart supports rx. Last OV: 06/19/2021 Next OV: 10/23/2021  Left patient a detailed voice message to return call to office

## 2021-09-09 NOTE — Telephone Encounter (Signed)
Pt needs a refill on her  hydrALAZINE (APRESOLINE) 50 MG tablet [[id number]]

## 2021-09-09 NOTE — Telephone Encounter (Signed)
  Encourage patient to contact the pharmacy for refills or they can request refills through [facility]  LAST APPOINTMENT DATE:  Please schedule appointment if longer than 1 year  NEXT APPOINTMENT DATE:  MEDICATION: hydrALAZINE (APRESOLINE) 50 MG tablet [[id number]]   Is the patient out of medication?   PHARMACY:  [id number] [place], [place] - [address] Phone:  [phone]      Let patient know to contact pharmacy at the end of the day to make sure medication is ready.  Please notify patient to allow 48-72 hours to process  CLINICAL FILLS OUT ALL BELOW:   LAST REFILL:  QTY:  REFILL DATE:    OTHER COMMENTS: pt needs a pre authorization   Okay for refill?  Please advise

## 2021-09-09 NOTE — Telephone Encounter (Signed)
RX sent today by Dr Doreene Burke.  Lft detailed VM on machine.  Dm/cma

## 2021-09-21 ENCOUNTER — Ambulatory Visit: Payer: Medicare Other | Admitting: Family Medicine

## 2021-10-06 ENCOUNTER — Telehealth: Payer: Self-pay | Admitting: Family Medicine

## 2021-10-06 DIAGNOSIS — I10 Essential (primary) hypertension: Secondary | ICD-10-CM

## 2021-10-06 MED ORDER — AMLODIPINE BESYLATE 10 MG PO TABS: 10.0000 mg | ORAL_TABLET | Freq: Every day | ORAL | 1 refills | Status: DC

## 2021-10-06 NOTE — Telephone Encounter (Signed)
Caller Name: Luther Newhouse Call back phone #: [phone]  Reason for Call: Pt needed Amlodipine refilled, she uses  [id number] [place], [place] - [address] Phone:  [phone]  Fax:  [phone]    Phone:  [phone]  Fax:  [phone]

## 2021-10-09 ENCOUNTER — Ambulatory Visit (INDEPENDENT_AMBULATORY_CARE_PROVIDER_SITE_OTHER): Payer: Medicare Other | Admitting: *Deleted

## 2021-10-09 DIAGNOSIS — I4891 Unspecified atrial fibrillation: Secondary | ICD-10-CM

## 2021-10-09 DIAGNOSIS — Z5181 Encounter for therapeutic drug level monitoring: Secondary | ICD-10-CM | POA: Diagnosis not present

## 2021-10-09 LAB — POCT INR: INR: 1.8 — AB (ref 2.0–3.0)

## 2021-10-09 NOTE — Patient Instructions (Addendum)
Description   Today take another 1/2 tablet then continue taking Warfarin 1 tablet daily except for 1/2 tablet on Mondays, Wednesdays and Fridays. Recheck INR in 5 weeks. Coumadin Clinic [phone] or [phone]

## 2021-10-13 ENCOUNTER — Encounter: Payer: Self-pay | Admitting: Internal Medicine

## 2021-10-13 ENCOUNTER — Ambulatory Visit (INDEPENDENT_AMBULATORY_CARE_PROVIDER_SITE_OTHER): Payer: Medicare Other | Admitting: Internal Medicine

## 2021-10-13 VITALS — BP 120/74 | HR 67 | Ht 67.0 in | Wt 195.8 lb

## 2021-10-13 DIAGNOSIS — I10 Essential (primary) hypertension: Secondary | ICD-10-CM | POA: Diagnosis not present

## 2021-10-13 DIAGNOSIS — I480 Paroxysmal atrial fibrillation: Secondary | ICD-10-CM

## 2021-10-13 NOTE — Patient Instructions (Addendum)
Medication Instructions:  Your physician recommends that you continue on your current medications as directed. Please refer to the Current Medication list given to you today.  Labwork: None ordered.  Testing/Procedures: None ordered.  Follow-Up: ( Referral to Dr. Lalla Brothers, to discuss Watchmen per Dr. Rosette Reveal has been ordered. Our scheduling team will reach out to you. )     Your physician wants you to follow-up in: AS NEEDED with DR. Rosette Reveal.  You will receive a reminder letter in the mail two months in advance. If you don't receive a letter, please call our office to schedule the follow-up appointment.    Implantable Loop Recorder Removal, Care After This sheet gives you information about how to care for yourself after your procedure. Your health care provider may also give you more specific instructions. If you have problems or questions, contact your health care provider. What can I expect after the procedure? After the procedure, it is common to have: Soreness or discomfort near the incision. Some swelling or bruising near the incision.  Follow these instructions at home: Incision care  Monitor your cardiac device site for redness, swelling, and drainage. Call the device clinic at [phone] if you experience these symptoms or fever/chills.  Keep the large square bandage on your site for 24 hours and then you may remove it yourself. Keep the steri-strips underneath in place.   You may shower after 72 hours / 3 days from your procedure with the steri-strips in place. They will usually fall off on their own, or may be removed after 10 days. Pat dry.   Avoid lotions, ointments, or perfumes over your incision until it is well-healed.  Please do not submerge in water until your site is completely healed.   If your wound site starts to bleed apply pressure.       If you have any questions/concerns please call the device clinic at [phone].  Activity  Return to your  normal activities.  Contact a health care provider if: You have redness, swelling, or pain around your incision. You have a fever.

## 2021-10-13 NOTE — Progress Notes (Signed)
HPI Ashley Woodard is returns today for consideration of ILR removal. The patient is a pleasant 71 yo woman with a h/o HTN and PAF. She has been on coumadin but because she has had some falling spells and would like to consider Watchman. She has not had any bleeding.  Allergies  Allergen Reactions   Amlodipine Swelling    Ankle edema   Demeclocycline Rash   Doxycycline Rash   Erythromycin Rash   Lisinopril Cough   Tetracyclines & Related Rash     Current Outpatient Medications  Medication Sig Dispense Refill   amLODipine (NORVASC) 10 MG tablet Take 1 tablet (10 mg total) by mouth daily. 90 tablet 1   atorvastatin (LIPITOR) 10 MG tablet Take 1 tablet (10 mg total) by mouth daily. 90 tablet 3   Blood Glucose Calibration (ONETOUCH VERIO) SOLN 1 Act by In Vitro route in the morning and at bedtime. 1 each 5   Blood Glucose Monitoring Suppl (ONETOUCH VERIO FLEX SYSTEM) w/Device KIT 1 Act by Does not apply route in the morning and at bedtime. 1 kit 3   diclofenac Sodium (VOLTAREN) 1 % GEL Apply a small grape sized dollop to tender to meet up to 4 times daily as needed 150 g 2   doxylamine, Sleep, (UNISOM) 25 MG tablet Take 25 mg by mouth at bedtime.     DULoxetine (CYMBALTA) 60 MG capsule Take 1 capsule (60 mg total) by mouth daily. 90 capsule 3   glucose blood (ONETOUCH VERIO) test strip 1 each by Other route in the morning and at bedtime. Use as instructed 100 each 12   hydrALAZINE (APRESOLINE) 50 MG tablet Take 1 tablet (50 mg total) by mouth 2 (two) times daily. 180 tablet 0   irbesartan (AVAPRO) 300 MG tablet TAKE 1 TABLET BY MOUTH EVERY DAY 90 tablet 0   Lancets (ONETOUCH ULTRASOFT) lancets 1 each by Other route in the morning and at bedtime. Use as instructed 100 each 5   levothyroxine (SYNTHROID) 88 MCG tablet Take 1 tablet (88 mcg total) by mouth daily. 90 tablet 2   metFORMIN (GLUCOPHAGE) 500 MG tablet TAKE 1 TABLET BY MOUTH TWICE A DAY WITH MEALS 180 tablet 0   OnabotulinumtoxinA  (BOTOX IJ) Inject as directed as needed.     torsemide (DEMADEX) 20 MG tablet TAKE 1 TABLET BY MOUTH EVERY DAY 90 tablet 0   warfarin (COUMADIN) 5 MG tablet Take 1 tablet (5 mg total) by mouth daily. 30 tablet 11   No current facility-administered medications for this visit.     Past Medical History:  Diagnosis Date   Abnormal cardiovascular stress test 08/21/2016   a. done for abnl EKG/cardiac risk factors -> admitted for cath 07/2016 which showed no significant CAD, normal LV contraction, mildly elevated filling pressure. Low dose Imdur was added for possible component of microvascular dysfunction.    Allergy    Anxiety    Arthritis    "back, knees, fingers" (05/10/2017)   Chronic lower back pain    Colon polyps    Deafness in right ear    Depression    Diverticulosis    Hx of adenomatous colonic polyps 05/08/2003   Hypertension    Hypertriglyceridemia    Hypothyroidism    Migraine    "none in the 2000s" (05/10/2017)   Osteoporosis    Paroxysmal atrial fibrillation (HCC)    Pneumonia    "several times" (05/10/2017)   PONV (postoperative nausea and vomiting)  S/P cardiac catheterization, 08/20/16 minimal CAD 08/21/2016   Type II diabetes mellitus ([place])    Voice tremor     ROS:   All systems reviewed and negative except as noted in the HPI.   Past Surgical History:  Procedure Laterality Date   ABDOMINAL HYSTERECTOMY  1988   ATRIAL FIBRILLATION ABLATION N/A 05/10/2017   Procedure: ATRIAL FIBRILLATION ABLATION;  Surgeon: Thompson Grayer, MD;  Location: [place] [facility];  Service: Cardiovascular;  Laterality: N/A;   COCHLEAR IMPLANT Right 2008   COLONOSCOPY     LEFT HEART CATH AND CORONARY ANGIOGRAPHY N/A 08/20/2016   Procedure: Left Heart Cath and Coronary Angiography;  Surgeon: Nelva Bush, MD;  Location: [place] [facility];  Service: Cardiovascular;  Laterality: N/A;   LOOP RECORDER INSERTION N/A 11/29/2017   Procedure: LOOP RECORDER INSERTION;  Surgeon: Thompson Grayer, MD;  Location: [place] [facility];  Service: Cardiovascular;  Laterality: N/A;   ORIF ANKLE FRACTURE Right 02/06/2019   Procedure: OPEN REDUCTION INTERNAL FIXATION (ORIF) ANKLE FRACTURE LATERAL MALLEOLUS, SYNDESMOSIS, RIGHT;  Surgeon: Erle Crocker, MD;  Location: [place];  Service: Orthopedics;  Laterality: Right;  SURGERY REQUEST TIME: 1.5 HOURS  CPT CODES: 96283, 27829, [phone], 27610     Family History  Problem Relation Age of Onset   Hypertension Mother    Diabetes Mother    Heart attack Mother    Colon polyps Mother    Hypertension Father    Heart attack Father    Stroke Father    Breast cancer Sister 90   Hypertension Brother    Cancer Brother        Prostate   Diabetes Brother    Dementia Brother    Stroke Brother    Heart attack Brother    Prostate cancer Brother    Hypertension Son    Breast cancer Maternal Aunt        in 30's   Cancer Maternal Uncle        Lung   Colon cancer Neg Hx    Esophageal cancer Neg Hx    Rectal cancer Neg Hx    Stomach cancer Neg Hx      Social History   Socioeconomic History   Marital status: Married    Spouse name: Not on file   Number of children: 2   Years of education: 12   Highest education level: Not on file  Occupational History   Occupation: Health and safety inspector  Tobacco Use   Smoking status: Never   Smokeless tobacco: Never  Vaping Use   Vaping Use: Never used  Substance and Sexual Activity   Alcohol use: No   Drug use: No   Sexual activity: Not Currently  Other Topics Concern   Not on file  Social History Narrative   Lives at home with husband in [place].   Right-handed.   No caffeine use.   Manages a call center   Social Determinants of Health   Financial Resource Strain: Low Risk  (05/20/2021)   Overall Financial Resource Strain (CARDIA)    Difficulty of Paying Living Expenses: Not hard at all  Food Insecurity: No Food Insecurity (05/20/2021)   Hunger Vital Sign    Worried  About Running Out of Food in the Last Year: Never true    Ran Out of Food in the Last Year: Never true  Transportation Needs: No Transportation Needs (05/20/2021)   PRAPARE - Hydrologist (Medical): No  Lack of Transportation (Non-Medical): No  Physical Activity: Inactive (05/20/2021)   Exercise Vital Sign    Days of Exercise per Week: 0 days    Minutes of Exercise per Session: 0 min  Stress: No Stress Concern Present (05/20/2021)   [place]    Feeling of Stress : Not at all  Social Connections: Moderately Integrated (05/20/2021)   Social Connection and Isolation Panel [NHANES]    Frequency of Communication with Friends and Family: Twice a week    Frequency of Social Gatherings with Friends and Family: Twice a week    Attends Religious Services: Never    Marine scientist or Organizations: Yes    Attends Archivist Meetings: 1 to 4 times per year    Marital Status: Married  Human resources officer Violence: Not At Risk (05/20/2021)   Humiliation, Afraid, Rape, and Kick questionnaire    Fear of Current or Ex-Partner: No    Emotionally Abused: No    Physically Abused: No    Sexually Abused: No     BP 120/74   Pulse 67   Ht '5\' 7"'  (1.702 m)   Wt 195 lb 12.8 oz (88.8 kg)   SpO2 99%   BMI 30.67 kg/m   Physical Exam:  Well appearing NAD HEENT: Unremarkable Neck:  No JVD, no thyromegally Lymphatics:  No adenopathy Back:  No CVA tenderness Lungs:  Clear with no wheezes HEART:  Regular rate rhythm, no murmurs, no rubs, no clicks Abd:  soft, positive bowel sounds, no organomegally, no rebound, no guarding Ext:  2 plus pulses, no edema, no cyanosis, no clubbing Skin:  No rashes no nodules Neuro:  CN II through XII intact, motor grossly intact   DEVICE  Normal device function.  See PaceArt for details.   Assess/Plan:  PAF - she is maintaining NSR. She has rare palpitations.  She has been on coumadin.  HTN - her bp is well controlled.  ILR - she would like to have it removed. I have discussed the options and we will remove today. EP procedure Note  Preoperative diagnosis: ILR insertion due to cryptogenic stroke  Postoper diagnosis: same as preop  Procedure performed: ILR removal.  Description of the procedure: after informed consent was obtained the patient was prepped and draped in a sterile fashion. 5 cc of lidocaine was infiltrated. A one cm incision was carried out. A combination of blunt and sharp dissection was utilized and the ILR was grasped The fibrous scar tissue was removed and the ILR removed with gentle traction.   Complications: none immediately  Conclusion: successful ILR removal without complication.   Ashley Overlie Christien Frankl,MD

## 2021-10-16 ENCOUNTER — Ambulatory Visit (INDEPENDENT_AMBULATORY_CARE_PROVIDER_SITE_OTHER): Payer: Medicare Other | Admitting: Cardiology

## 2021-10-16 ENCOUNTER — Encounter: Payer: Self-pay | Admitting: Cardiology

## 2021-10-16 ENCOUNTER — Encounter: Payer: Self-pay | Admitting: *Deleted

## 2021-10-16 VITALS — BP 136/78 | HR 71 | Ht 67.0 in | Wt 194.4 lb

## 2021-10-16 DIAGNOSIS — I480 Paroxysmal atrial fibrillation: Secondary | ICD-10-CM | POA: Diagnosis not present

## 2021-10-16 DIAGNOSIS — I10 Essential (primary) hypertension: Secondary | ICD-10-CM

## 2021-10-16 DIAGNOSIS — Z01818 Encounter for other preprocedural examination: Secondary | ICD-10-CM

## 2021-10-16 DIAGNOSIS — E119 Type 2 diabetes mellitus without complications: Secondary | ICD-10-CM

## 2021-10-16 NOTE — Patient Instructions (Signed)
Medication Instructions:  none *If you need a refill on your cardiac medications before your next appointment, please call your pharmacy*   Lab Work: BMP If you have labs (blood work) drawn today and your tests are completely normal, you will receive your results only by: MyChart Message (if you have MyChart) OR A paper copy in the mail If you have any lab test that is abnormal or we need to change your treatment, we will call you to review the results.   Testing/Procedures: Your physician has requested that you have an echocardiogram. Echocardiography is a painless test that uses sound waves to create images of your heart. It provides your doctor with information about the size and shape of your heart and how well your heart's chambers and valves are working. This procedure takes approximately one hour. There are no restrictions for this procedure.  Your physician has requested that you have cardiac CT. Cardiac computed tomography (CT) is a painless test that uses an x-ray machine to take clear, detailed pictures of your heart. For further information please visit [email or web address] Please follow instruction sheet as given.  Your physician has requested that you have Left atrial appendage (LAA) closure device implantation is a procedure to put a small device in the LAA of the heart. The LAA is a small sac in the wall of the heart's left upper chamber. Blood clots can form in this area. The device, Watchman closes the LAA to help prevent a blood clot and stroke.  Follow-Up: At [facility], you and your health needs are our priority.  As part of our continuing mission to provide you with exceptional heart care, we have created designated Provider Care Teams.  These Care Teams include your primary Cardiologist (physician) and Advanced Practice Providers (APPs -  Physician Assistants and Nurse Practitioners) who all work together to provide you with the care you need, when you need it.  We  recommend signing up for the patient portal called "MyChart".  Sign up information is provided on this After Visit Summary.  MyChart is used to connect with patients for Virtual Visits (Telemedicine).  Patients are able to view lab/test results, encounter notes, upcoming appointments, etc.  Non-urgent messages can be sent to your provider as well.   To learn more about what you can do with MyChart, go to [email or web address].    Your next appointment:   Karsten Fells, the Watchman Nurse Navigator, will call you after your CT once the [facility] Team has reviewed your imaging for an update on proceedings. Katy's direct number is [phone] if you need assistance.  Marland Kitchen}  Other Instructions   Important Information About Sugar

## 2021-10-16 NOTE — Progress Notes (Signed)
I Electrophysiology Office Note:    Date:  10/16/2021   ID:  Ashley Woodard, DOB October 27, 1950, MRN [id number]  PCP:  Libby Maw, MD  [facility] Cardiologist:  None  [facility] Electrophysiologist:  Vickie Epley, MD   Referring MD: Libby Maw,*   Chief Complaint: Atrial fibrillation  History of Present Illness:    Ashley Woodard is a 71 y.o. female who presents for an evaluation of atrial fibrillation at the request of Dr. Lovena Le. Their medical history includes hypertension, diabetes, paroxysmal atrial fibrillation on Coumadin and frequent falls. We tried to get The patient last saw Dr. Lovena Le on October 13, 2021.  At that appointment her loop recorder was removed.  She had an atrial fibrillation ablation in March 2019.  During that ablation the veins were isolated.  Today she tells me that she has had a fall when walking on some stairs.  Unclear reason for the fall.  She also tells me she misses dose of blood thinners occasionally.  She has trouble remembering the doses.  Past Surgical History:  Procedure Laterality Date   ABDOMINAL HYSTERECTOMY  1988   ATRIAL FIBRILLATION ABLATION N/A 05/10/2017   Procedure: ATRIAL FIBRILLATION ABLATION;  Surgeon: Thompson Grayer, MD;  Location: [place] [facility];  Service: Cardiovascular;  Laterality: N/A;   COCHLEAR IMPLANT Right 2008   COLONOSCOPY     LEFT HEART CATH AND CORONARY ANGIOGRAPHY N/A 08/20/2016   Procedure: Left Heart Cath and Coronary Angiography;  Surgeon: Nelva Bush, MD;  Location: [place] [facility];  Service: Cardiovascular;  Laterality: N/A;   LOOP RECORDER INSERTION N/A 11/29/2017   Procedure: LOOP RECORDER INSERTION;  Surgeon: Thompson Grayer, MD;  Location: [place] [facility];  Service: Cardiovascular;  Laterality: N/A;   ORIF ANKLE FRACTURE Right 02/06/2019   Procedure: OPEN REDUCTION INTERNAL FIXATION (ORIF) ANKLE FRACTURE LATERAL MALLEOLUS, SYNDESMOSIS, RIGHT;  Surgeon: Erle Crocker,  MD;  Location: [place];  Service: Orthopedics;  Laterality: Right;  SURGERY REQUEST TIME: 1.5 HOURS  CPT CODES: 03500, 27829, 93818, 27610    Current Medications: Current Meds  Medication Sig   amLODipine (NORVASC) 10 MG tablet Take 1 tablet (10 mg total) by mouth daily.   atorvastatin (LIPITOR) 10 MG tablet Take 1 tablet (10 mg total) by mouth daily.   Blood Glucose Calibration (ONETOUCH VERIO) SOLN 1 Act by In Vitro route in the morning and at bedtime.   Blood Glucose Monitoring Suppl (ONETOUCH VERIO FLEX SYSTEM) w/Device KIT 1 Act by Does not apply route in the morning and at bedtime.   diclofenac Sodium (VOLTAREN) 1 % GEL Apply a small grape sized dollop to tender to meet up to 4 times daily as needed   doxylamine, Sleep, (UNISOM) 25 MG tablet Take 25 mg by mouth at bedtime.   DULoxetine (CYMBALTA) 60 MG capsule Take 1 capsule (60 mg total) by mouth daily.   glucose blood (ONETOUCH VERIO) test strip 1 each by Other route in the morning and at bedtime. Use as instructed   hydrALAZINE (APRESOLINE) 50 MG tablet Take 1 tablet (50 mg total) by mouth 2 (two) times daily.   irbesartan (AVAPRO) 300 MG tablet TAKE 1 TABLET BY MOUTH EVERY DAY   Lancets (ONETOUCH ULTRASOFT) lancets 1 each by Other route in the morning and at bedtime. Use as instructed   levothyroxine (SYNTHROID) 88 MCG tablet Take 1 tablet (88 mcg total) by mouth daily.   metFORMIN (GLUCOPHAGE) 500 MG tablet TAKE 1 TABLET  BY MOUTH TWICE A DAY WITH MEALS   OnabotulinumtoxinA (BOTOX IJ) Inject as directed as needed.   torsemide (DEMADEX) 20 MG tablet TAKE 1 TABLET BY MOUTH EVERY DAY   warfarin (COUMADIN) 5 MG tablet Take 1 tablet (5 mg total) by mouth daily.     Allergies:   Amlodipine, Demeclocycline, Doxycycline, Erythromycin, Lisinopril, and Tetracyclines & related   Social History   Socioeconomic History   Marital status: Married    Spouse name: Not on file   Number of children: 2   Years of education:  12   Highest education level: Not on file  Occupational History   Occupation: Health and safety inspector  Tobacco Use   Smoking status: Never   Smokeless tobacco: Never  Vaping Use   Vaping Use: Never used  Substance and Sexual Activity   Alcohol use: No   Drug use: No   Sexual activity: Not Currently  Other Topics Concern   Not on file  Social History Narrative   Lives at home with husband in [place].   Right-handed.   No caffeine use.   Manages a call center   Social Determinants of Health   Financial Resource Strain: Low Risk  (05/20/2021)   Overall Financial Resource Strain (CARDIA)    Difficulty of Paying Living Expenses: Not hard at all  Food Insecurity: No Food Insecurity (05/20/2021)   Hunger Vital Sign    Worried About Running Out of Food in the Last Year: Never true    Ran Out of Food in the Last Year: Never true  Transportation Needs: No Transportation Needs (05/20/2021)   PRAPARE - Hydrologist (Medical): No    Lack of Transportation (Non-Medical): No  Physical Activity: Inactive (05/20/2021)   Exercise Vital Sign    Days of Exercise per Week: 0 days    Minutes of Exercise per Session: 0 min  Stress: No Stress Concern Present (05/20/2021)   [place]    Feeling of Stress : Not at all  Social Connections: Moderately Integrated (05/20/2021)   Social Connection and Isolation Panel [NHANES]    Frequency of Communication with Friends and Family: Twice a week    Frequency of Social Gatherings with Friends and Family: Twice a week    Attends Religious Services: Never    Marine scientist or Organizations: Yes    Attends Music therapist: 1 to 4 times per year    Marital Status: Married     Family History: The patient's family history includes Breast cancer in her maternal aunt; Breast cancer (age of onset: 30) in her sister; Cancer in her brother and maternal uncle;  Colon polyps in her mother; Dementia in her brother; Diabetes in her brother and mother; Heart attack in her brother, father, and mother; Hypertension in her brother, father, mother, and son; Prostate cancer in her brother; Stroke in her brother and father. There is no history of Colon cancer, Esophageal cancer, Rectal cancer, or Stomach cancer.  ROS:   Please see the history of present illness.    All other systems reviewed and are negative.  EKGs/Labs/Other Studies Reviewed:    The following studies were reviewed today:     Recent Labs: 12/17/2020: ALT 16 03/27/2021: Hemoglobin 12.4; Platelets 371.0; TSH 1.29 06/19/2021: BUN 21; Creat 1.23; Potassium 3.5; Sodium 143  Recent Lipid Panel    Component Value Date/Time   CHOL 114 03/26/2020 1528   TRIG 154.0 (H)  03/26/2020 1528   HDL 41.50 03/26/2020 1528   CHOLHDL 3 03/26/2020 1528   VLDL 30.8 03/26/2020 1528   LDLCALC 41 03/26/2020 1528   LDLCALC 41 01/05/2019 1512   LDLDIRECT 44.0 03/27/2021 0812    Physical Exam:    VS:  BP 136/78   Pulse 71   Ht _0  (1.702 m)   Wt 194 lb 6.4 oz (88.2 kg)   SpO2 94%   BMI 30.45 kg/m     Wt Readings from Last 3 Encounters:  10/16/21 194 lb 6.4 oz (88.2 kg)  10/13/21 195 lb 12.8 oz (88.8 kg)  06/19/21 189 lb 9.6 oz (86 kg)     GEN:  Well nourished, well developed in no acute distress HEENT: Normal NECK: No JVD; No carotid bruits LYMPHATICS: No lymphadenopathy CARDIAC: RRR, no murmurs, rubs, gallops RESPIRATORY:  Clear to auscultation without rales, wheezing or rhonchi  ABDOMEN: Soft, non-tender, non-distended MUSCULOSKELETAL:  No edema; No deformity  SKIN: Warm and dry NEUROLOGIC:  Alert and oriented x 3 PSYCHIATRIC:  Normal affect       ASSESSMENT:    1. Paroxysmal atrial fibrillation (HCC)   2. Primary hypertension   3. Type 2 diabetes mellitus without complication, without long-term current use of insulin (HCC)    PLAN:    In order of problems listed  above:  #Paroxysmal atrial fibrillation Overall very low burden after her ablation in 2019.  Has been maintained on Coumadin for stroke prophylaxis given an elevated CHA2DS2-VASc.  Given recent falls and forgetting doses of her medications, the patient is interested in pursuing left atrial appendage occlusion as a mechanism to reduce her stroke risk and avoid long-term exposure to anticoagulation.  --------------  I have seen Robie Ridge Ambriz in the office today who is being considered for a Watchman left atrial appendage closure device. I believe they will benefit from this procedure given their history of atrial fibrillation, CHA2DS2-VASc score of 4 and unadjusted ischemic stroke rate of 4.8% per year. Unfortunately, the patient is not felt to be a long term anticoagulation candidate secondary to frequent falls and trouble adhering to her medication schedule. The patient's chart has been reviewed and I feel that they would be a candidate for short term oral anticoagulation after Watchman implant.   It is my belief that after undergoing a LAA closure procedure, Ashley Woodard will not need long term anticoagulation which eliminates anticoagulation side effects and major bleeding risk.   Procedural risks for the Watchman implant have been reviewed with the patient including a 0.5% risk of stroke, <1% risk of perforation and <1% risk of device embolization. Other risks include bleeding, vascular damage, tamponade, worsening renal function, and death. The patient understands these risk and wishes to proceed.     The published clinical data on the safety and effectiveness of WATCHMAN include but are not limited to the following: - Holmes DR, Mechele Claude, Sick P et al. for the PROTECT AF Investigators. Percutaneous closure of the left atrial appendage versus warfarin therapy for prevention of stroke in patients with atrial fibrillation: a randomised non-inferiority trial. Lancet 2009; 374: 534-42. Mechele Claude, Doshi  SK, Abelardo Diesel D et al. on behalf of the PROTECT AF Investigators. Percutaneous Left Atrial Appendage Closure for Stroke Prophylaxis in Patients With Atrial Fibrillation 2.3-Year Follow-up of the PROTECT AF (Watchman Left Atrial Appendage System for Embolic Protection in Patients With Atrial Fibrillation) Trial. Circulation 2013; 127:720-729. - Alli Marsh Dolly  VY, Sievert H et al. Quality of Life Assessment in the Randomized PROTECT AF (Percutaneous Closure of the Left Atrial Appendage Versus Warfarin Therapy for Prevention of Stroke in Patients With Atrial Fibrillation) Trial of Patients at Risk for Stroke With Nonvalvular Atrial Fibrillation. J Am Coll Cardiol 2013; 67:2094-7. Vertell Limber DR, Tarri Abernethy, Price M, [place], Sievert H, Doshi S, Huber K, Reddy V. Prospective randomized evaluation of the Watchman left atrial appendage Device in patients with atrial fibrillation versus long-term warfarin therapy; the PREVAIL trial. Journal of the [employer] of Cardiology, Vol. 4, No. 1, 2014, 1-11. - Kar S, Doshi SK, Sadhu A, Horton R, Osorio J et al. Primary outcome evaluation of a next-generation left atrial appendage closure device: results from the PINNACLE FLX trial. Circulation 2021;143(18)1754-1762.    After today's visit with the patient which was dedicated solely for shared decision making visit regarding LAA closure device, the patient decided to proceed with the LAA appendage closure procedure scheduled to be done in the near future at [facility]. Prior to the procedure, I would like to obtain a gated CT scan of the chest with contrast timed for PV/LA visualization.  She will also need an echocardiogram prior to the procedure.   HAS-BLED score 2 Hypertension Yes  Abnormal renal and liver function (Dialysis, transplant, Cr >2.26 mg/dL /Cirrhosis or Bilirubin >2x Normal or AST/ALT/AP >3x Normal) No  Stroke No  Bleeding No  Labile INR (Unstable/high INR) No   Elderly (>65) Yes  Drugs or alcohol (? 8 drinks/week, anti-plt or NSAID) No   CHA2DS2-VASc Score = 4  The patient's score is based upon: CHF History: 0 HTN History: 1 Diabetes History: 1 Stroke History: 0 Vascular Disease History: 0 Age Score: 1 Gender Score: 1    Medication Adjustments/Labs and Tests Ordered: Current medicines are reviewed at length with the patient today.  Concerns regarding medicines are outlined above.  No orders of the defined types were placed in this encounter.  No orders of the defined types were placed in this encounter.    Signed, Hilton Cork. Quentin Ore, MD, [facility], [facility] 10/16/2021 3:43 PM    Electrophysiology [facility]

## 2021-10-17 LAB — BASIC METABOLIC PANEL
BUN/Creatinine Ratio: 13 (ref 12–28)
BUN: 19 mg/dL (ref 8–27)
CO2: 22 mmol/L (ref 20–29)
Calcium: 10 mg/dL (ref 8.7–10.3)
Chloride: 100 mmol/L (ref 96–106)
Creatinine, Ser: 1.42 mg/dL — ABNORMAL HIGH (ref 0.57–1.00)
Glucose: 89 mg/dL (ref 70–99)
Potassium: 3.5 mmol/L (ref 3.5–5.2)
Sodium: 140 mmol/L (ref 134–144)
eGFR: 40 mL/min/{1.73_m2} — ABNORMAL LOW (ref >59)

## 2021-10-19 ENCOUNTER — Telehealth: Payer: Self-pay

## 2021-10-23 ENCOUNTER — Encounter: Payer: Self-pay | Admitting: Family Medicine

## 2021-10-23 ENCOUNTER — Ambulatory Visit (INDEPENDENT_AMBULATORY_CARE_PROVIDER_SITE_OTHER): Payer: Medicare Other | Admitting: Family Medicine

## 2021-10-23 VITALS — BP 128/68 | HR 70 | Temp 97.3°F | Ht 67.0 in | Wt 193.6 lb

## 2021-10-23 DIAGNOSIS — M5136 Other intervertebral disc degeneration, lumbar region: Secondary | ICD-10-CM

## 2021-10-23 DIAGNOSIS — E7800 Pure hypercholesterolemia, unspecified: Secondary | ICD-10-CM | POA: Insufficient documentation

## 2021-10-23 DIAGNOSIS — E119 Type 2 diabetes mellitus without complications: Secondary | ICD-10-CM

## 2021-10-23 LAB — LIPID PANEL
Cholesterol: 113 mg/dL (ref 0–200)
HDL: 41.5 mg/dL (ref >39.00)
LDL Cholesterol: 36 mg/dL (ref 0–99)
NonHDL: 71.34
Total CHOL/HDL Ratio: 3
Triglycerides: 175 mg/dL — ABNORMAL HIGH (ref 0.0–149.0)
VLDL: 35 mg/dL (ref 0.0–40.0)

## 2021-10-23 LAB — HEMOGLOBIN A1C: Hgb A1c MFr Bld: 6.9 % — ABNORMAL HIGH (ref 4.6–6.5)

## 2021-10-23 NOTE — Progress Notes (Signed)
   Established Patient Office Visit  Subjective   Patient ID: Ashley Woodard, female    DOB: 10-23-1950  Age: 71 y.o. MRN: [id number]  Chief Complaint  Patient presents with  . Follow-up    4 month follow up, no concerns. Patient fasting.     HPI  {History (Optional):23778}  ROS    Objective:     BP 128/68 (BP Location: Right Arm, Patient Position: Sitting, Cuff Size: Large)   Pulse 70   Temp (!) 97.3 F (36.3 C) (Temporal)   Ht 5\' 7"  (1.702 m)   Wt 193 lb 9.6 oz (87.8 kg)   SpO2 96%   BMI 30.32 kg/m  {Vitals History (Optional):23777}  Physical Exam   No results found for any visits on 10/23/21.  {Labs (Optional):23779}  The ASCVD Risk score (Arnett DK, et al., 2019) failed to calculate for the following reasons:   The valid total cholesterol range is 130 to 320 mg/dL    Assessment & Plan:   Problem List Items Addressed This Visit       Endocrine   Type 2 diabetes mellitus without complication, without long-term current use of insulin (HCC) - Primary   Relevant Orders   Hemoglobin A1c     Musculoskeletal and Integument   DDD (degenerative disc disease), lumbar     Other   Elevated LDL cholesterol level   Relevant Orders   Lipid panel    Return in about 6 months (around 04/23/2022).    06/23/2022, MD

## 2021-10-27 ENCOUNTER — Other Ambulatory Visit: Payer: Self-pay | Admitting: Family Medicine

## 2021-10-27 DIAGNOSIS — Z1231 Encounter for screening mammogram for malignant neoplasm of breast: Secondary | ICD-10-CM

## 2021-10-27 DIAGNOSIS — Z23 Encounter for immunization: Secondary | ICD-10-CM | POA: Diagnosis not present

## 2021-10-29 ENCOUNTER — Ambulatory Visit (HOSPITAL_COMMUNITY): Payer: Medicare Other | Attending: Cardiology

## 2021-10-29 DIAGNOSIS — I10 Essential (primary) hypertension: Secondary | ICD-10-CM | POA: Insufficient documentation

## 2021-10-29 DIAGNOSIS — I480 Paroxysmal atrial fibrillation: Secondary | ICD-10-CM | POA: Diagnosis not present

## 2021-10-29 DIAGNOSIS — Z01818 Encounter for other preprocedural examination: Secondary | ICD-10-CM | POA: Insufficient documentation

## 2021-10-29 DIAGNOSIS — E119 Type 2 diabetes mellitus without complications: Secondary | ICD-10-CM | POA: Diagnosis not present

## 2021-10-29 LAB — ECHOCARDIOGRAM COMPLETE
Area-P 1/2: 3.05 cm2
P 1/2 time: 403 msec
S' Lateral: 3.1 cm

## 2021-11-04 ENCOUNTER — Telehealth (HOSPITAL_COMMUNITY): Payer: Self-pay | Admitting: *Deleted

## 2021-11-04 NOTE — Telephone Encounter (Signed)
Attempted to call patient regarding upcoming cardiac CT appointment. °Left message on voicemail with name and callback number ° °Gurveer Colucci RN Navigator Cardiac Imaging °[facility] °[phone] Office °[phone] Cell ° °

## 2021-11-05 ENCOUNTER — Ambulatory Visit (HOSPITAL_COMMUNITY)
Admission: RE | Admit: 2021-11-05 | Discharge: 2021-11-05 | Disposition: A | Discharge status: Home / Self Care | Payer: Medicare Other | Source: Ambulatory Visit | Attending: Cardiology | Admitting: Cardiology

## 2021-11-05 DIAGNOSIS — E119 Type 2 diabetes mellitus without complications: Secondary | ICD-10-CM | POA: Insufficient documentation

## 2021-11-05 DIAGNOSIS — I10 Essential (primary) hypertension: Secondary | ICD-10-CM | POA: Insufficient documentation

## 2021-11-05 DIAGNOSIS — I480 Paroxysmal atrial fibrillation: Secondary | ICD-10-CM | POA: Insufficient documentation

## 2021-11-05 DIAGNOSIS — Z01818 Encounter for other preprocedural examination: Secondary | ICD-10-CM | POA: Diagnosis not present

## 2021-11-05 MED ORDER — IOHEXOL 350 MG/ML SOLN
95.0000 mL | Freq: Once | INTRAVENOUS | Status: AC | PRN
  Administered 2021-11-05: 95 mL via INTRAVENOUS

## 2021-11-09 ENCOUNTER — Telehealth: Payer: Self-pay | Admitting: Neurology

## 2021-11-09 IMAGING — RF DG C-ARM 1-60 MIN
1 series · 3 of 3 positions shown · non-contrast
Comparison: Radiograph 01/26/2019

CLINICAL DATA: ORIF of a lateral malleolar fracture

EXAM:
DG C-ARM 1-60 MIN; RIGHT ANKLE - COMPLETE 3+ VIEW

[Series 1: run · 3 of 3 slices shown]
[im 1/3]
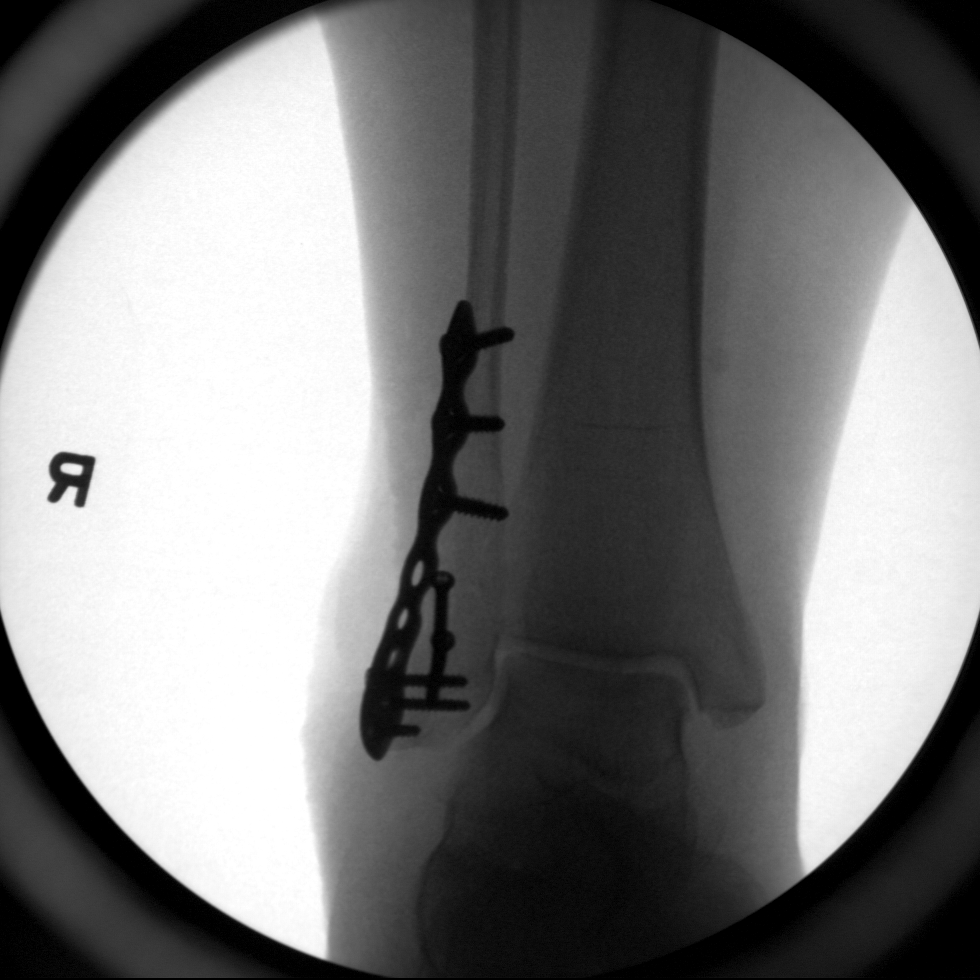
[im 2/3]
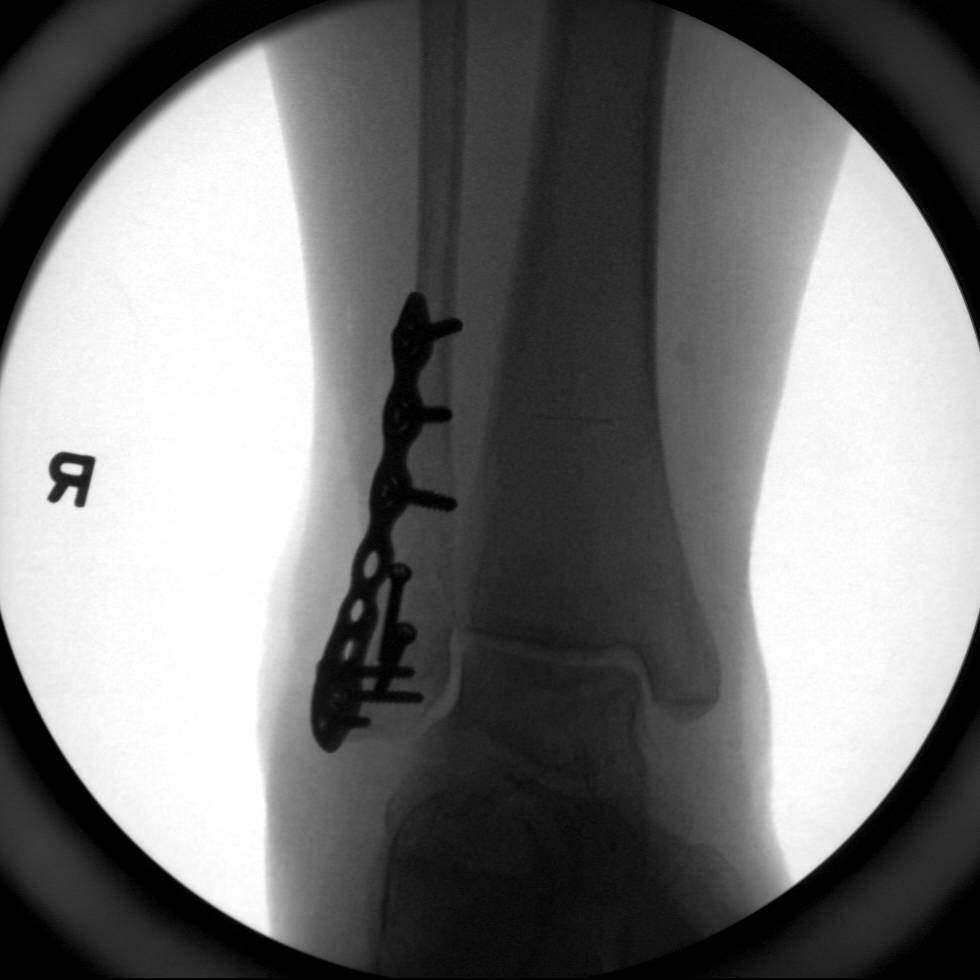
[im 3/3]
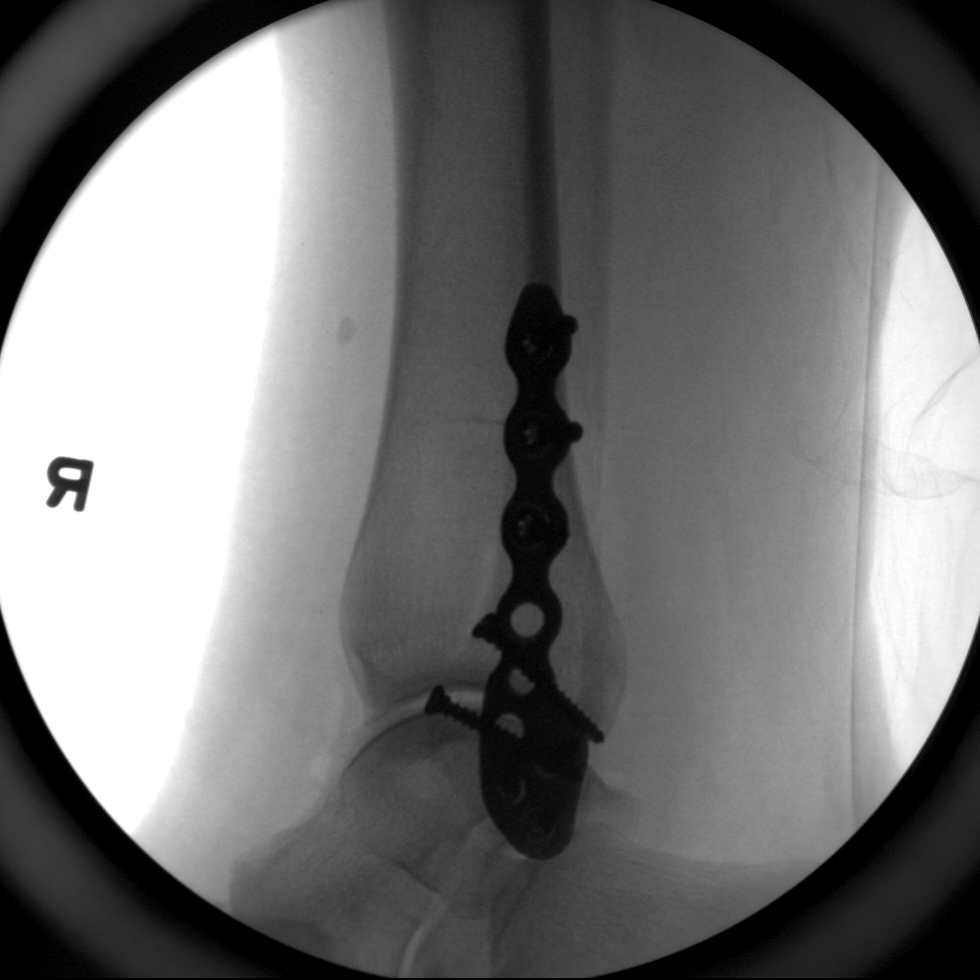

[3 of 3 positions shown; findings below may reference images not displayed]

FINDINGS: Intraoperative fluoroscopic views depict interval placement of a
lateral plate and screw fixation and posterior to anteriorly
directed fully threaded screws transfixing the distal fibular
fracture on comparison radiographs. Alignment is near anatomic.
Expected postsurgical soft tissue changes are seen.
IMPRESSION: Near anatomic alignment status post ORIF of distal fibular fracture.

## 2021-11-09 NOTE — Telephone Encounter (Signed)
Rescheduled for 12/23/2021.

## 2021-11-09 NOTE — Telephone Encounter (Signed)
Dr. Redmond Baseman office called to schedule Botox appt on the day Dr. Redmond Baseman available to come to [facility] office during the Botox appt.

## 2021-11-10 ENCOUNTER — Telehealth: Payer: Self-pay

## 2021-11-10 NOTE — Telephone Encounter (Signed)
Reviewed results with patient who verbalized understanding.   The patient wishes to proceed with LAAO on 12/03/2021. She understands she will need weekly INR checks until procedure. Her next check is 9/22. Scheduled her for pre-procedure visit 10/4 (serum INR will be drawn at [facility] that day).  She will need 1 more check the week of her procedure. She was grateful for call and agrees with plan.

## 2021-11-10 NOTE — Telephone Encounter (Signed)
-----   Message from Vickie Epley, MD sent at 11/08/2021  1:54 PM EDT ----- OK to proceed with Watchman evaluation.  Lysbeth Galas T. Quentin Ore, MD, [facility], [facility] Cardiac Electrophysiology

## 2021-11-11 ENCOUNTER — Other Ambulatory Visit: Payer: Self-pay

## 2021-11-11 DIAGNOSIS — I480 Paroxysmal atrial fibrillation: Secondary | ICD-10-CM

## 2021-11-12 ENCOUNTER — Ambulatory Visit
Admission: RE | Admit: 2021-11-12 | Discharge: 2021-11-12 | Disposition: A | Discharge status: Home / Self Care | Payer: Medicare Other | Source: Ambulatory Visit | Attending: Family Medicine | Admitting: Family Medicine

## 2021-11-12 DIAGNOSIS — Z1231 Encounter for screening mammogram for malignant neoplasm of breast: Secondary | ICD-10-CM

## 2021-11-13 ENCOUNTER — Ambulatory Visit: Payer: Medicare Other | Attending: Cardiology | Admitting: *Deleted

## 2021-11-13 DIAGNOSIS — I4891 Unspecified atrial fibrillation: Secondary | ICD-10-CM | POA: Diagnosis not present

## 2021-11-13 LAB — POCT INR: INR: 2.3 (ref 2.0–3.0)

## 2021-11-13 NOTE — Patient Instructions (Signed)
Description   Continue taking Warfarin 1 tablet daily except for 1/2 tablet on Mondays, Wednesdays and Fridays. Recheck INR in 1 week-pending Watchman 12/03/21. Coumadin Clinic [phone] or [phone]

## 2021-11-16 ENCOUNTER — Encounter: Payer: Self-pay | Admitting: Neurology

## 2021-11-16 ENCOUNTER — Encounter: Payer: Self-pay | Admitting: Family Medicine

## 2021-11-17 ENCOUNTER — Other Ambulatory Visit: Payer: Self-pay | Admitting: Internal Medicine

## 2021-11-17 ENCOUNTER — Other Ambulatory Visit: Payer: Self-pay | Admitting: Family Medicine

## 2021-11-17 DIAGNOSIS — I10 Essential (primary) hypertension: Secondary | ICD-10-CM

## 2021-11-17 DIAGNOSIS — E119 Type 2 diabetes mellitus without complications: Secondary | ICD-10-CM

## 2021-11-20 ENCOUNTER — Ambulatory Visit: Payer: Medicare Other | Attending: Internal Medicine

## 2021-11-20 DIAGNOSIS — I4891 Unspecified atrial fibrillation: Secondary | ICD-10-CM

## 2021-11-20 DIAGNOSIS — Z5181 Encounter for therapeutic drug level monitoring: Secondary | ICD-10-CM | POA: Diagnosis not present

## 2021-11-20 LAB — POCT INR: INR: 2.5 (ref 2.0–3.0)

## 2021-11-20 NOTE — Patient Instructions (Signed)
Continue taking Warfarin 1 tablet daily except for 1/2 tablet on Mondays, Wednesdays and Fridays. Recheck INR in 2 weeks-pending Watchman 12/03/21. Coumadin Clinic [phone] or [phone]

## 2021-11-24 NOTE — Progress Notes (Unsigned)
[facility]                                     Cardiology Office Note:    Date:  11/25/2021   ID:  [name], DOB 1951-01-22, MRN [id number]  PCP:  Libby Maw, MD  [facility] Cardiologist:  None  [facility] Electrophysiologist:  Vickie Epley, MD   Referring MD: Libby Maw,*   Chief Complaint  Patient presents with   Follow-up    Pre LAAO   History of Present Illness:    Ashley Woodard is a 71 y.o. female with a hx of paroxysmal atrial fibrillation on Coumadin s/p AF ablation 2019, HTN, and DM2. She has been followed by Dr. Lovena Le for her AF and was referred to Dr. Quentin Ore for the evaluation of [place] placement due to frequent falls and poor medication compliance.   She underwent pre-LAAO CT imaging which showed anatomy suitable for Watchman implant.   Today she is here alone and states that she has been well from a CV standpoint with no chest pain, palpitations, SOB, LE edema, bleeding in stool of urine, dizziness, or syncope.   Pre Watchman instructions reviewed with understanding. She will stay in the hospital overnight given timing of her procedure.    Past Medical History:  Diagnosis Date   Abnormal cardiovascular stress test 08/21/2016   a. done for abnl EKG/cardiac risk factors -> admitted for cath 07/2016 which showed no significant CAD, normal LV contraction, mildly elevated filling pressure. Low dose Imdur was added for possible component of microvascular dysfunction.    Allergy    Anxiety    Arthritis    "back, knees, fingers" (05/10/2017)   Chronic lower back pain    Colon polyps    Deafness in right ear    Depression    Diverticulosis    Hx of adenomatous colonic polyps 05/08/2003   Hypertension    Hypertriglyceridemia    Hypothyroidism    Migraine    "none in the 2000s" (05/10/2017)   Osteoporosis    Paroxysmal atrial fibrillation (HCC)    Pneumonia    "several times" (05/10/2017)   PONV (postoperative nausea  and vomiting)    S/P cardiac catheterization, 08/20/16 minimal CAD 08/21/2016   Type II diabetes mellitus ([place])    Voice tremor     Past Surgical History:  Procedure Laterality Date   ABDOMINAL HYSTERECTOMY  1988   ATRIAL FIBRILLATION ABLATION N/A 05/10/2017   Procedure: ATRIAL FIBRILLATION ABLATION;  Surgeon: Thompson Grayer, MD;  Location: [place] [facility];  Service: Cardiovascular;  Laterality: N/A;   COCHLEAR IMPLANT Right 2008   COLONOSCOPY     LEFT HEART CATH AND CORONARY ANGIOGRAPHY N/A 08/20/2016   Procedure: Left Heart Cath and Coronary Angiography;  Surgeon: Nelva Bush, MD;  Location: [place] [facility];  Service: Cardiovascular;  Laterality: N/A;   LOOP RECORDER INSERTION N/A 11/29/2017   Procedure: LOOP RECORDER INSERTION;  Surgeon: Thompson Grayer, MD;  Location: [place] [facility];  Service: Cardiovascular;  Laterality: N/A;   ORIF ANKLE FRACTURE Right 02/06/2019   Procedure: OPEN REDUCTION INTERNAL FIXATION (ORIF) ANKLE FRACTURE LATERAL MALLEOLUS, SYNDESMOSIS, RIGHT;  Surgeon: Erle Crocker, MD;  Location: [place];  Service: Orthopedics;  Laterality: Right;  SURGERY REQUEST TIME: 1.5 HOURS  CPT CODES: 72536, 64403, 47425, 27610    Current Medications: Current Meds  Medication Sig   amLODipine (NORVASC) 10 MG tablet Take 1 tablet (10 mg total) by mouth daily.   atorvastatin (LIPITOR) 10 MG tablet Take 1 tablet (10 mg total) by mouth daily.   Blood Glucose Calibration (ONETOUCH VERIO) SOLN 1 Act by In Vitro route in the morning and at bedtime.   Blood Glucose Monitoring Suppl (ONETOUCH VERIO FLEX SYSTEM) w/Device KIT 1 Act by Does not apply route in the morning and at bedtime.   diclofenac Sodium (VOLTAREN) 1 % GEL Apply a small grape sized dollop to tender to meet up to 4 times daily as needed   doxylamine, Sleep, (UNISOM) 25 MG tablet Take 25 mg by mouth at bedtime.   DULoxetine (CYMBALTA) 60 MG capsule Take 1 capsule (60 mg total) by mouth  daily.   glucose blood (ONETOUCH VERIO) test strip 1 each by Other route in the morning and at bedtime. Use as instructed   hydrALAZINE (APRESOLINE) 50 MG tablet TAKE 1 TABLET BY MOUTH TWICE A DAY   irbesartan (AVAPRO) 300 MG tablet TAKE 1 TABLET BY MOUTH EVERY DAY   Lancets (ONETOUCH ULTRASOFT) lancets 1 each by Other route in the morning and at bedtime. Use as instructed   levothyroxine (SYNTHROID) 88 MCG tablet Take 1 tablet (88 mcg total) by mouth daily.   metFORMIN (GLUCOPHAGE) 500 MG tablet TAKE 1 TABLET BY MOUTH TWICE A DAY WITH MEALS   OnabotulinumtoxinA (BOTOX IJ) Inject as directed as needed.   torsemide (DEMADEX) 20 MG tablet TAKE 1 TABLET BY MOUTH EVERY DAY   warfarin (COUMADIN) 5 MG tablet TAKE 1 TABLET (5 MG TOTAL) BY MOUTH DAILY.     Allergies:   Amlodipine, Demeclocycline, Doxycycline, Erythromycin, Lisinopril, and Tetracyclines & related   Social History   Socioeconomic History   Marital status: Married    Spouse name: Not on file   Number of children: 2   Years of education: 12   Highest education level: Not on file  Occupational History   Occupation: Health and safety inspector  Tobacco Use   Smoking status: Never   Smokeless tobacco: Never  Vaping Use   Vaping Use: Never used  Substance and Sexual Activity   Alcohol use: No   Drug use: No   Sexual activity: Not Currently  Other Topics Concern   Not on file  Social History Narrative   Lives at home with husband in [place].   Right-handed.   No caffeine use.   Manages a call center   Social Determinants of Health   Financial Resource Strain: Low Risk  (05/20/2021)   Overall Financial Resource Strain (CARDIA)    Difficulty of Paying Living Expenses: Not hard at all  Food Insecurity: No Food Insecurity (05/20/2021)   Hunger Vital Sign    Worried About Running Out of Food in the Last Year: Never true    Ran Out of Food in the Last Year: Never true  Transportation Needs: No Transportation Needs (05/20/2021)   PRAPARE  - Hydrologist (Medical): No    Lack of Transportation (Non-Medical): No  Physical Activity: Inactive (05/20/2021)   Exercise Vital Sign    Days of Exercise per Week: 0 days    Minutes of Exercise per Session: 0 min  Stress: No Stress Concern Present (05/20/2021)   [place]    Feeling of Stress : Not at all  Social Connections: Moderately Integrated (05/20/2021)   Social Connection and Isolation Panel [NHANES]  Frequency of Communication with Friends and Family: Twice a week    Frequency of Social Gatherings with Friends and Family: Twice a week    Attends Religious Services: Never    Marine scientist or Organizations: Yes    Attends Music therapist: 1 to 4 times per year    Marital Status: Married     Family History: The patient's family history includes Breast cancer in her maternal aunt and maternal aunt; Breast cancer (age of onset: 106) in her sister; Cancer in her brother and maternal uncle; Colon polyps in her mother; Dementia in her brother; Diabetes in her brother and mother; Heart attack in her brother, father, and mother; Hypertension in her brother, father, mother, and son; Prostate cancer in her brother; Stroke in her brother and father. There is no history of Colon cancer, Esophageal cancer, Rectal cancer, or Stomach cancer.  ROS:   Please see the history of present illness.    All other systems reviewed and are negative.  EKGs/Labs/Other Studies Reviewed:    The following studies were reviewed today:  CT 11-18-21:  IMPRESSION: 1. The left atrial appendage is large broccoli type with two lobes.   2. A 31 mm Watchman FLX device is recommended based on the above landing zone measurements (26.8 mm maximum diameter; 14% compression).   3. There is no thrombus in the left atrial appendage.   4. An inferior posterior IAS puncture site is recommended.    5. Optimal deployment angle: RAO 10 CRA 22   6. CAC score of 0.   7. Dilated pulmonary artery suggestive of pulmonary hypertension.   EKG:  EKG is ordered today.  The ekg ordered today demonstrates NSR  Recent Labs: 12/17/2020: ALT 16 03/27/2021: Hemoglobin 12.4; Platelets 371.0; TSH 1.29 10/16/2021: BUN 19; Creatinine, Ser 1.42; Potassium 3.5; Sodium 140   Recent Lipid Panel    Component Value Date/Time   CHOL 113 10/23/2021 1024   TRIG 175.0 (H) 10/23/2021 1024   HDL 41.50 10/23/2021 1024   CHOLHDL 3 10/23/2021 1024   VLDL 35.0 10/23/2021 1024   LDLCALC 36 10/23/2021 1024   LDLCALC 41 01/05/2019 1512   LDLDIRECT 44.0 03/27/2021 0812   Risk Assessment/Calculations:    HAS-BLED score 2 Hypertension Yes  Abnormal renal and liver function (Dialysis, transplant, Cr >2.26 mg/dL /Cirrhosis or Bilirubin >2x Normal or AST/ALT/AP >3x Normal) No  Stroke No  Bleeding No  Labile INR (Unstable/high INR) No  Elderly (>65) Yes  Drugs or alcohol (? 8 drinks/week, anti-plt or NSAID) No    CHA2DS2-VASc Score = 4  The patient's score is based upon: CHF History: 0 HTN History: 1 Diabetes History: 1 Stroke History: 0 Vascular Disease History: 0 Age Score: 1 Gender Score: 1   Physical Exam:    VS:  BP 138/66   Pulse 66   Ht $R'5\' 7"'th$  (1.702 m)   Wt 193 lb (87.5 kg)   BMI 30.23 kg/m     Wt Readings from Last 3 Encounters:  11/25/21 193 lb (87.5 kg)  10/23/21 193 lb 9.6 oz (87.8 kg)  10/16/21 194 lb 6.4 oz (88.2 kg)    General: Well developed, well nourished, NAD Neck: Negative for carotid bruits. No JVD Lungs:Clear to ausculation bilaterally.  Cardiovascular: RRR with S1 S2. No murmurs Extremities: No edema. Neuro: Alert and oriented. No focal deficits. No facial asymmetry. MAE spontaneously. Psych: Responds to questions appropriately with normal affect.    ASSESSMENT/PLAN:    PAF: s/p AF  ablation in 2019 and reports no recurrent AF symptoms. She has had frequent falls and  reports poor compliance with anticoagulation and wishes to proceed with LAAO implant scheduled for 12/03/21. She has had stable INRs leading up to her procedure with INR 9/22 at 2.3, INR 9/29 at 2.5. She will not need to hold Coumadin leading up to implant. Obtain BMET, CBC, INR today. Pre instructions reviewed with the patient with understanding.   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 Plan to follow with surveillance imaging. She is asymptomatic at this time.   HTN: Stable with no changes today  Medication Adjustments/Labs and Tests Ordered: Current medicines are reviewed at length with the patient today.  Concerns regarding medicines are outlined above.  Orders Placed This Encounter  Procedures   Basic metabolic panel   CBC   Protime-INR   EKG 12-Lead   No orders of the defined types were placed in this encounter.   Patient Instructions  Medication Instructions:  Your physician recommends that you continue on your current medications as directed. Please refer to the Current Medication list given to you today.  *If you need a refill on your cardiac medications before your next appointment, please call your pharmacy*   Lab Work: TODAY: BMET, CBC, INR If you have labs (blood work) drawn today and your tests are completely normal, you will receive your results only by: [place] (if you have MyChart) OR A paper copy in the mail If you have any lab test that is abnormal or we need to change your treatment, we will call you to review the results.   Testing/Procedures: SEE INSTRUCTION LETTER    Follow-Up: At [facility], you and your health needs are our priority.  As part of our continuing mission to provide you with exceptional heart care, we have created designated Provider Care Teams.  These Care Teams include your primary Cardiologist (physician) and Advanced Practice Providers (APPs -  Physician  Assistants and Nurse Practitioners) who all work together to provide you with the care you need, when you need it.  We recommend signing up for the patient portal called "MyChart".  Sign up information is provided on this After Visit Summary.  MyChart is used to connect with patients for Virtual Visits (Telemedicine).  Patients are able to view lab/test results, encounter notes, upcoming appointments, etc.  Non-urgent messages can be sent to your provider as well.   To learn more about what you can do with MyChart, go to [email or web address].    Your next appointment:   YOUR POST-PROCEDURE FOLLOW-UP VISIT WILL BE ARRANGED BEFORE YOU ARE DISCHARGED.  Important Information About Sugar         Signed, Kathyrn Drown, NP  11/25/2021 11:51 AM     [facility]

## 2021-11-25 ENCOUNTER — Ambulatory Visit: Payer: Medicare Other | Attending: Cardiology | Admitting: Cardiology

## 2021-11-25 VITALS — BP 138/66 | HR 66 | Ht 67.0 in | Wt 193.0 lb

## 2021-11-25 DIAGNOSIS — I351 Nonrheumatic aortic (valve) insufficiency: Secondary | ICD-10-CM | POA: Insufficient documentation

## 2021-11-25 DIAGNOSIS — I4891 Unspecified atrial fibrillation: Secondary | ICD-10-CM | POA: Insufficient documentation

## 2021-11-25 DIAGNOSIS — I10 Essential (primary) hypertension: Secondary | ICD-10-CM | POA: Insufficient documentation

## 2021-11-25 DIAGNOSIS — E119 Type 2 diabetes mellitus without complications: Secondary | ICD-10-CM | POA: Insufficient documentation

## 2021-11-25 DIAGNOSIS — Z01818 Encounter for other preprocedural examination: Secondary | ICD-10-CM | POA: Diagnosis not present

## 2021-11-25 DIAGNOSIS — I480 Paroxysmal atrial fibrillation: Secondary | ICD-10-CM | POA: Insufficient documentation

## 2021-11-25 LAB — BASIC METABOLIC PANEL
BUN/Creatinine Ratio: 20 (ref 12–28)
BUN: 26 mg/dL (ref 8–27)
CO2: 28 mmol/L (ref 20–29)
Calcium: 9.9 mg/dL (ref 8.7–10.3)
Chloride: 104 mmol/L (ref 96–106)
Creatinine, Ser: 1.3 mg/dL — ABNORMAL HIGH (ref 0.57–1.00)
Glucose: 237 mg/dL — ABNORMAL HIGH (ref 70–99)
Potassium: 3.6 mmol/L (ref 3.5–5.2)
Sodium: 142 mmol/L (ref 134–144)
eGFR: 44 mL/min/{1.73_m2} — ABNORMAL LOW (ref >59)

## 2021-11-25 LAB — CBC
Hematocrit: 34.6 % (ref 34.0–46.6)
Hemoglobin: 11.3 g/dL (ref 11.1–15.9)
MCH: 27.8 pg (ref 26.6–33.0)
MCHC: 32.7 g/dL (ref 31.5–35.7)
MCV: 85 fL (ref 79–97)
Platelets: 344 10*3/uL (ref 150–450)
RBC: 4.06 x10E6/uL (ref 3.77–5.28)
RDW: 15.2 % (ref 11.7–15.4)
WBC: 8.4 10*3/uL (ref 3.4–10.8)

## 2021-11-25 LAB — PROTIME-INR
INR: 2.6 — ABNORMAL HIGH (ref 0.9–1.2)
Prothrombin Time: 25.2 s — ABNORMAL HIGH (ref 9.1–12.0)

## 2021-11-25 NOTE — Patient Instructions (Signed)
Medication Instructions:  Your physician recommends that you continue on your current medications as directed. Please refer to the Current Medication list given to you today.  *If you need a refill on your cardiac medications before your next appointment, please call your pharmacy*   Lab Work: TODAY: BMET, CBC, INR If you have labs (blood work) drawn today and your tests are completely normal, you will receive your results only by: [place] (if you have MyChart) OR A paper copy in the mail If you have any lab test that is abnormal or we need to change your treatment, we will call you to review the results.   Testing/Procedures: SEE INSTRUCTION LETTER    Follow-Up: At [facility], you and your health needs are our priority.  As part of our continuing mission to provide you with exceptional heart care, we have created designated Provider Care Teams.  These Care Teams include your primary Cardiologist (physician) and Advanced Practice Providers (APPs -  Physician Assistants and Nurse Practitioners) who all work together to provide you with the care you need, when you need it.  We recommend signing up for the patient portal called "MyChart".  Sign up information is provided on this After Visit Summary.  MyChart is used to connect with patients for Virtual Visits (Telemedicine).  Patients are able to view lab/test results, encounter notes, upcoming appointments, etc.  Non-urgent messages can be sent to your provider as well.   To learn more about what you can do with MyChart, go to [email or web address].    Your next appointment:   YOUR POST-PROCEDURE FOLLOW-UP VISIT WILL BE ARRANGED BEFORE YOU ARE DISCHARGED.  Important Information About Sugar

## 2021-11-26 ENCOUNTER — Telehealth: Payer: Self-pay

## 2021-11-26 ENCOUNTER — Ambulatory Visit (INDEPENDENT_AMBULATORY_CARE_PROVIDER_SITE_OTHER): Payer: Medicare Other | Admitting: Cardiology

## 2021-11-26 DIAGNOSIS — Z5181 Encounter for therapeutic drug level monitoring: Secondary | ICD-10-CM

## 2021-11-26 NOTE — Telephone Encounter (Signed)
Lpmtcb and discuss INR result. °

## 2021-11-26 NOTE — Telephone Encounter (Signed)
Please see Anticoagulation Encounter for details.

## 2021-11-26 NOTE — Telephone Encounter (Signed)
New Message:     Patient would like for Ashley Woodard to please give her a call.

## 2021-11-30 ENCOUNTER — Telehealth: Payer: Self-pay | Admitting: Cardiology

## 2021-11-30 ENCOUNTER — Telehealth: Payer: Self-pay

## 2021-11-30 NOTE — Telephone Encounter (Signed)
Pt c/o medication issue:  1. Name of Medication:   warfarin (COUMADIN) 5 MG tablet       2. How are you currently taking this medication (dosage and times per day)? TAKE 1 TABLET (5 MG TOTAL) BY MOUTH DAILY  3. Are you having a reaction (difficulty breathing--STAT)? No  4. What is your medication issue? Pt states that she thinks she may have taken the wrong dosage of medication. Please advise

## 2021-11-30 NOTE — Telephone Encounter (Signed)
I spoke to the patient who may have missed 10/6 dose 0.5 tablet so I advised her to take the 0.5 tablet today 10/9, because she is having Watchman procedure 10/12.  She verbalized understanding and is coming in 10/11 for INR check.

## 2021-12-01 ENCOUNTER — Telehealth: Payer: Self-pay

## 2021-12-01 NOTE — Telephone Encounter (Signed)
Left message to call back  

## 2021-12-02 ENCOUNTER — Ambulatory Visit: Payer: Medicare Other | Attending: Cardiology | Admitting: *Deleted

## 2021-12-02 ENCOUNTER — Telehealth: Payer: Self-pay

## 2021-12-02 DIAGNOSIS — I4891 Unspecified atrial fibrillation: Secondary | ICD-10-CM

## 2021-12-02 DIAGNOSIS — Z5181 Encounter for therapeutic drug level monitoring: Secondary | ICD-10-CM

## 2021-12-02 LAB — POCT INR: INR: 3.9 — AB (ref 2.0–3.0)

## 2021-12-02 NOTE — Telephone Encounter (Signed)
Confirmed procedure date of 12/03/2021. Confirmed arrival time of 1100 for procedure time at 1330. Reviewed pre-procedure instructions with patient and she understands she will spend the night at the hospital. She understands if her INR is not therapeutic she will be called later (her appointment is at 1530) to discuss. The patient understands to call if questions/concerns arise prior to procedure.

## 2021-12-02 NOTE — Telephone Encounter (Signed)
The patient mismanaged her Coumadin this weekend and her INR check today was 3.9.  Per Dr. Quentin Ore, the patient will need to reschedule. Called and informed the patient she cannot have the procedure tomorrow. She agrees to procedure on 03/04/22.  Will call next week to confirm plan.   Will send to Coumadin Clinic as FYI. Informed the patient I would request them to call her in the AM as I'm not sure her INR check tomorrow is necessary since the procedure is being postponed. Her appointment is currently scheduled for 10AM.

## 2021-12-02 NOTE — Patient Instructions (Addendum)
Description   Since you have already taken today's dose, HAVE LEAFY VEGGIES AS WE DISCUSSED AND DO NOT TAKE WARFARIN IN THE MORNING.  Normal Dose: Warfarin 1 tablet daily except for 1/2 tablet on Mondays, Wednesdays and Fridays. Recheck INR tomorrow - Watchman 12/03/21. Coumadin Clinic [phone] or [phone]

## 2021-12-03 ENCOUNTER — Inpatient Hospital Stay (HOSPITAL_COMMUNITY): Admission: RE | Admit: 2021-12-03 | Payer: Medicare Other | Source: Ambulatory Visit | Admitting: Cardiology

## 2021-12-03 ENCOUNTER — Ambulatory Visit: Payer: Medicare Other

## 2021-12-03 ENCOUNTER — Other Ambulatory Visit: Payer: Self-pay

## 2021-12-03 DIAGNOSIS — I480 Paroxysmal atrial fibrillation: Secondary | ICD-10-CM

## 2021-12-03 NOTE — Telephone Encounter (Signed)
Received thorough update from Ignacio, [place] regarding pt's watchman procedure being rescheduled to 03/04/2022. Called and spoke with pt to reschedule INR appt from today. Also, advised pt since she took yesterday's dose of warfarin to hold it today then resume normal dose of warfarin, which is, 1 tablet daily except for 1/2 tablet on Mondays, Wednesdays and Fridays. She verbalized understanding and appt set and confirmed with pt. She is aware to call if she needs anything.

## 2021-12-07 NOTE — Telephone Encounter (Signed)
The patient is scheduled for LAAO on 03/04/22.  Scheduled her for pre-procedure visit on 03/01/2022. She will require weekly INR checks for 4 weeks prior to procedure.  Left message to call back to confirm plan.

## 2021-12-08 ENCOUNTER — Ambulatory Visit (INDEPENDENT_AMBULATORY_CARE_PROVIDER_SITE_OTHER): Payer: Medicare Other | Admitting: Neurology

## 2021-12-08 ENCOUNTER — Encounter: Payer: Self-pay | Admitting: Neurology

## 2021-12-08 VITALS — BP 158/70 | HR 78 | Ht 67.0 in | Wt 193.0 lb

## 2021-12-08 DIAGNOSIS — M62838 Other muscle spasm: Secondary | ICD-10-CM

## 2021-12-08 DIAGNOSIS — J385 Laryngeal spasm: Secondary | ICD-10-CM | POA: Diagnosis not present

## 2021-12-08 DIAGNOSIS — J383 Other diseases of vocal cords: Secondary | ICD-10-CM | POA: Diagnosis not present

## 2021-12-08 DIAGNOSIS — R490 Dysphonia: Secondary | ICD-10-CM

## 2021-12-08 MED ORDER — ONABOTULINUMTOXINA 100 UNITS IJ SOLR: 100.0000 [IU] | Freq: Once | INTRAMUSCULAR | Status: AC

## 2021-12-08 NOTE — Progress Notes (Signed)
    ASSESSMENT AND PLAN  Ashley Woodard is a 71 y.o. female   Spastic dysphonia  Received low-dose Botox injection by Dr. Redmond Baseman in February 2019, responding well,  Plans to resume injections again, she also complains of frequent postnasal drainage  Will refer her back to Dr. Redmond Baseman, and to coordinate her EMG guided Botox injection through our office   DIAGNOSTIC DATA (LABS, IMAGING, TESTING) - I reviewed patient records, labs, notes, testing and imaging myself where available.  CT cardiac in March 2019 1. Coronary calcium score 0 Agatston units, suggesting low risk for future cardiac events.   2. No significant obstructive coronary disease noted, though study somewhat limited by motion artifact.   3.  No LA appendage thrombus seen.   HISTORICAL: Ashley Woodard is a 71 year old female, seen in refer by ENT physician Dr. Jaclyn Prime coordinator injection through our office [place] for evaluation of dysphonia, initial evaluation was on March 08, 2017. Her PCP is Dr. 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   She denies dysarthria, no dysphagia, she reported family history of tremor, her maternal aunt suffered essential tremor, maternal uncle has severe essential tremor, spastic dysphonia, required deep brain stimulator, her 68 year old son suffered bilateral hand tremor   For laparoscopic laryngoscope vocal fold without mass, scarring, or ulceration, the vocal folds adduct and abduct to symmetrically, but there is good  glottal closure, there is rhythmic spasm of the vocal fold with phonation in adduction pattern, she was recommended laryngeal Botox treatment   UPDATE Apr 18 2017: This is her first EMG guided injection for spastic dysphonia  Under EMG guidance, BOTOX A was injected into bilateral thyroarytenoid muscles, 2.0 Units at each side, by Dr. Redmond Baseman. Laboratory evaluation April 2022: BMP showed slight elevated glucose 109, normal TSH 1.57, A1c 6.6, lipid panel, LDL 41, triglyceride 154  UPDATE July 5th 2022: She reported significant improvement following her first and only EMG guided Botox injection by Dr. Redmond Baseman on April 18, 2017, however due to her work-related schedule and other medical issues, she lost to follow-up, she retired now, wants to continue Botox treatment, she continue complains of frequent postnasal drainage, strained voice with prolonged talking  For posterior nasal drainage, she complains of frequent itching, scratching sensation, tried different allergy medicine, nasal spray, Neti pot, without helping her symptoms, she was also treated for the GERD in the past without helping her symptoms.   Update December 08, 2021: EMG guided thyroarytenoid injection by Dr. Redmond Baseman for the adductor spastic dysphonia, 2.0 units at each side, patient had mild cough following injection to the right side,  Marcial Pacas, M.D. Ph.D.  [facility] [place], [place] [address] Phone: [phone] Fax:      [phone]

## 2021-12-08 NOTE — Progress Notes (Signed)
Botox 100 units x 1 vial  [id number] [phone] Exp-02/26 B/B

## 2021-12-08 NOTE — Telephone Encounter (Signed)
Confirmed plan with the patient. She agrees to weekly INR checks through December with pre-procedure visit on 03/01/2022 in preparation for LAAO on 03/04/2022. She was grateful for call and agrees with plan.

## 2021-12-18 ENCOUNTER — Ambulatory Visit: Payer: Medicare Other | Attending: Internal Medicine | Admitting: *Deleted

## 2021-12-18 DIAGNOSIS — Z5181 Encounter for therapeutic drug level monitoring: Secondary | ICD-10-CM | POA: Diagnosis not present

## 2021-12-18 DIAGNOSIS — I4891 Unspecified atrial fibrillation: Secondary | ICD-10-CM | POA: Diagnosis not present

## 2021-12-18 LAB — POCT INR: POC INR: 2.4

## 2021-12-18 NOTE — Patient Instructions (Signed)
Description   *Watchman scheduled for 03/04/2022 *  Continue taking Warfarin 1 tablet daily except for 1/2 tablet on Mondays, Wednesdays and Fridays. Recheck INR in 3 weeks.  [facility] [phone] or [phone]

## 2021-12-22 DIAGNOSIS — Z23 Encounter for immunization: Secondary | ICD-10-CM | POA: Diagnosis not present

## 2021-12-23 ENCOUNTER — Ambulatory Visit: Payer: Medicare Other | Admitting: Neurology

## 2022-01-06 ENCOUNTER — Encounter: Payer: Self-pay | Admitting: Family Medicine

## 2022-01-06 DIAGNOSIS — E669 Obesity, unspecified: Secondary | ICD-10-CM | POA: Diagnosis not present

## 2022-01-06 DIAGNOSIS — E1122 Type 2 diabetes mellitus with diabetic chronic kidney disease: Secondary | ICD-10-CM | POA: Diagnosis not present

## 2022-01-06 DIAGNOSIS — Z6830 Body mass index (BMI) 30.0-30.9, adult: Secondary | ICD-10-CM | POA: Diagnosis not present

## 2022-01-06 DIAGNOSIS — I10 Essential (primary) hypertension: Secondary | ICD-10-CM | POA: Diagnosis not present

## 2022-01-06 DIAGNOSIS — E782 Mixed hyperlipidemia: Secondary | ICD-10-CM | POA: Diagnosis not present

## 2022-01-08 ENCOUNTER — Ambulatory Visit: Payer: Medicare Other | Attending: Cardiology

## 2022-01-08 DIAGNOSIS — I4891 Unspecified atrial fibrillation: Secondary | ICD-10-CM

## 2022-01-08 DIAGNOSIS — Z5181 Encounter for therapeutic drug level monitoring: Secondary | ICD-10-CM | POA: Diagnosis not present

## 2022-01-08 LAB — POCT INR: INR: 2.7 (ref 2.0–3.0)

## 2022-01-08 NOTE — Patient Instructions (Signed)
Description   *Watchman scheduled for 03/04/2022 *  Continue taking Warfarin 1 tablet daily except for 1/2 tablet on Mondays, Wednesdays and Fridays. Recheck INR in 4 weeks.  [facility] [phone]

## 2022-01-18 DIAGNOSIS — I10 Essential (primary) hypertension: Secondary | ICD-10-CM | POA: Diagnosis not present

## 2022-01-18 DIAGNOSIS — Z6830 Body mass index (BMI) 30.0-30.9, adult: Secondary | ICD-10-CM | POA: Diagnosis not present

## 2022-01-18 DIAGNOSIS — E1122 Type 2 diabetes mellitus with diabetic chronic kidney disease: Secondary | ICD-10-CM | POA: Diagnosis not present

## 2022-01-18 DIAGNOSIS — E669 Obesity, unspecified: Secondary | ICD-10-CM | POA: Diagnosis not present

## 2022-02-05 ENCOUNTER — Ambulatory Visit: Payer: Medicare Other | Attending: Cardiovascular Disease

## 2022-02-05 ENCOUNTER — Telehealth: Payer: Self-pay | Admitting: Cardiology

## 2022-02-05 DIAGNOSIS — I4891 Unspecified atrial fibrillation: Secondary | ICD-10-CM | POA: Diagnosis not present

## 2022-02-05 DIAGNOSIS — Z5181 Encounter for therapeutic drug level monitoring: Secondary | ICD-10-CM

## 2022-02-05 LAB — POCT INR: INR: 2 (ref 2.0–3.0)

## 2022-02-05 NOTE — Patient Instructions (Signed)
Description   *Watchman scheduled for 03/04/2022 *  Take an extra 1/2 tablet today and then continue taking Warfarin 1 tablet daily except for 1/2 tablet on Mondays, Wednesdays and Fridays.  Recheck INR in 1 week.  [facility] [phone]

## 2022-02-05 NOTE — Telephone Encounter (Signed)
Patient seen for INR check today with [facility]. INR at 2.0 today. She is scheduled for repeat INR check next Friday 02/12/22.   Watchman scheduled for 03/04/2022 and will require weekly INR labs until then.   Georgie Chard NP-C Structural [facility] Team

## 2022-02-10 DIAGNOSIS — E1122 Type 2 diabetes mellitus with diabetic chronic kidney disease: Secondary | ICD-10-CM | POA: Diagnosis not present

## 2022-02-10 DIAGNOSIS — E669 Obesity, unspecified: Secondary | ICD-10-CM | POA: Diagnosis not present

## 2022-02-10 DIAGNOSIS — Z6830 Body mass index (BMI) 30.0-30.9, adult: Secondary | ICD-10-CM | POA: Diagnosis not present

## 2022-02-10 DIAGNOSIS — I10 Essential (primary) hypertension: Secondary | ICD-10-CM | POA: Diagnosis not present

## 2022-02-11 ENCOUNTER — Ambulatory Visit (INDEPENDENT_AMBULATORY_CARE_PROVIDER_SITE_OTHER): Payer: Medicare Other

## 2022-02-11 ENCOUNTER — Ambulatory Visit
Admission: RE | Admit: 2022-02-11 | Discharge: 2022-02-11 | Disposition: A | Discharge status: Home / Self Care | Payer: Medicare Other | Source: Ambulatory Visit | Attending: Emergency Medicine | Admitting: Emergency Medicine

## 2022-02-11 VITALS — BP 134/72 | HR 66 | Temp 97.6°F | Resp 16

## 2022-02-11 DIAGNOSIS — R0989 Other specified symptoms and signs involving the circulatory and respiratory systems: Secondary | ICD-10-CM | POA: Diagnosis not present

## 2022-02-11 DIAGNOSIS — R062 Wheezing: Secondary | ICD-10-CM

## 2022-02-11 DIAGNOSIS — J189 Pneumonia, unspecified organism: Secondary | ICD-10-CM

## 2022-02-11 DIAGNOSIS — J3089 Other allergic rhinitis: Secondary | ICD-10-CM

## 2022-02-11 DIAGNOSIS — J302 Other seasonal allergic rhinitis: Secondary | ICD-10-CM

## 2022-02-11 MED ORDER — PROMETHAZINE-DM 6.25-15 MG/5ML PO SYRP: 5.0000 mL | ORAL_SOLUTION | Freq: Four times a day (QID) | ORAL | 0 refills | Status: DC | PRN

## 2022-02-11 MED ORDER — FLUTICASONE PROPIONATE 50 MCG/ACT NA SUSP: 1.0000 | Freq: Every day | NASAL | 1 refills | Status: DC

## 2022-02-11 MED ORDER — AMOXICILLIN-POT CLAVULANATE 875-125 MG PO TABS: 1.0000 | ORAL_TABLET | Freq: Two times a day (BID) | ORAL | 0 refills | Status: AC

## 2022-02-11 MED ORDER — AZITHROMYCIN 250 MG PO TABS: ORAL_TABLET | ORAL | 0 refills | Status: AC

## 2022-02-11 MED ORDER — LEVOCETIRIZINE DIHYDROCHLORIDE 5 MG PO TABS: 5.0000 mg | ORAL_TABLET | Freq: Every evening | ORAL | 1 refills | Status: DC

## 2022-02-11 MED ORDER — ALBUTEROL SULFATE (2.5 MG/3ML) 0.083% IN NEBU
2.5000 mg | INHALATION_SOLUTION | Freq: Once | RESPIRATORY_TRACT | Status: AC
  Administered 2022-02-11: 2.5 mg via RESPIRATORY_TRACT

## 2022-02-11 MED ORDER — ALBUTEROL SULFATE HFA 108 (90 BASE) MCG/ACT IN AERS: 2.0000 | INHALATION_SPRAY | Freq: Four times a day (QID) | RESPIRATORY_TRACT | 1 refills | Status: DC | PRN

## 2022-02-11 NOTE — ED Provider Notes (Signed)
[facility]    CSN: [id number] Arrival date & time: 02/11/22  1016    HISTORY   Chief Complaint  Patient presents with   Ear Fullness    Congestion for 2 weeks, yellow mucus, started in chest now in my head.  Headache, cough, matted eyes, cough. - Entered by patient   Headache   Cough   HPI Ashley Woodard is a pleasant, 71 y.o. female who presents to urgent care today. Patient reports a 2-week history of chest congestion and cough intermittently productive of sputum.  Patient states symptoms progressed to head congestion, ear fullness and headache in the past few days.  Patient states has been taking Mucinex which helps a little bit.  Patient reports a history of getting lower respiratory tract infections once or twice a year and sinus infections a few times a year as well.  Patient denies history of allergies and asthma.  Patient denies cigarette smoking.  Patient denies history of allergies, asthma, emphysema, fever, body aches, chills, nausea, vomiting, diarrhea, sore throat, known sick contacts.  The history is provided by the patient.   Past Medical History:  Diagnosis Date   Abnormal cardiovascular stress test 08/21/2016   a. done for abnl EKG/cardiac risk factors -> admitted for cath 07/2016 which showed no significant CAD, normal LV contraction, mildly elevated filling pressure. Low dose Imdur was added for possible component of microvascular dysfunction.    Allergy    Anxiety    Arthritis    "back, knees, fingers" (05/10/2017)   Chronic lower back pain    Colon polyps    Deafness in right ear    Depression    Diverticulosis    Hx of adenomatous colonic polyps 05/08/2003   Hypertension    Hypertriglyceridemia    Hypothyroidism    Migraine    "none in the 2000s" (05/10/2017)   Osteoporosis    Paroxysmal atrial fibrillation ([place])    Pneumonia    "several times" (05/10/2017)   PONV (postoperative nausea and vomiting)    S/P cardiac catheterization, 08/20/16  minimal CAD 08/21/2016   Type II diabetes mellitus ([place])    Voice tremor    Patient Active Problem List   Diagnosis Date Noted   Elevated LDL cholesterol level 10/23/2021   Left elbow pain 06/19/2021   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   Atrial fibrillation ([place]) 03/12/2021   Flu vaccine need 12/17/2020   Estrogen deficiency 05/15/2020   Hyperlipidemia LDL goal <70 05/12/2020   Recurrent major depressive disorder, in full remission ([place]) 05/12/2020   Stage 3a chronic kidney disease ([place]) 03/27/2020   Dyslipidemia, goal LDL below 100 03/27/2020   Pure hyperglyceridemia 03/27/2020   Diuretic-induced hypokalemia 03/27/2020   Primary hypertension 03/26/2020   Paroxysmal atrial fibrillation ([place]) 05/10/2017   Hypothyroidism 02/24/2017   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   S/P cardiac catheterization, 08/20/16 minimal CAD  08/21/2016   Unstable angina ([facility])    DDD (degenerative disc disease), lumbar 06/29/2016   Meniere's disease 06/29/2016   Type 2 diabetes mellitus without complication, without long-term current use of insulin ([place]) 09/30/2014   Past Surgical History:  Procedure Laterality Date   ABDOMINAL HYSTERECTOMY  1988   ATRIAL FIBRILLATION ABLATION N/A 05/10/2017   Procedure: ATRIAL FIBRILLATION ABLATION;  Surgeon: Thompson Grayer, MD;  Location: [facility];  Service: Cardiovascular;  Laterality: N/A;   COCHLEAR IMPLANT Right 2008   COLONOSCOPY     LEFT HEART CATH AND CORONARY ANGIOGRAPHY N/A 08/20/2016   Procedure: Left Heart  Cath and Coronary Angiography;  Surgeon: Nelva Bush, MD;  Location: [facility];  Service: Cardiovascular;  Laterality: N/A;   LOOP RECORDER INSERTION N/A 11/29/2017   Procedure: LOOP RECORDER INSERTION;  Surgeon: Thompson Grayer, MD;  Location: [facility];  Service: Cardiovascular;  Laterality: N/A;   ORIF ANKLE FRACTURE Right 02/06/2019   Procedure: OPEN REDUCTION INTERNAL FIXATION (ORIF) ANKLE FRACTURE LATERAL MALLEOLUS, SYNDESMOSIS,  RIGHT;  Surgeon: Erle Crocker, MD;  Location: [facility];  Service: Orthopedics;  Laterality: Right;  SURGERY REQUEST TIME: 1.5 HOURS  CPT CODES: 96789, 27829, 38101, 75102   OB History   No obstetric history on file.    Home Medications    Prior to Admission medications   Medication Sig Start Date End Date Taking? Authorizing Provider  amLODipine (NORVASC) 10 MG tablet Take 1 tablet (10 mg total) by mouth daily. 10/06/21   Libby Maw, MD  atorvastatin (LIPITOR) 10 MG tablet Take 1 tablet (10 mg total) by mouth daily. 06/18/21   Libby Maw, MD  Blood Glucose Calibration (ONETOUCH VERIO) SOLN 1 Act by In Vitro route in the morning and at bedtime. 06/09/20   Janith Lima, MD  Blood Glucose Monitoring Suppl (ONETOUCH VERIO FLEX SYSTEM) w/Device KIT 1 Act by Does not apply route in the morning and at bedtime. 05/12/20   Janith Lima, MD  diclofenac Sodium (VOLTAREN) 1 % GEL Apply a small grape sized dollop to tender to meet up to 4 times daily as needed 06/19/21   Libby Maw, MD  diphenhydrAMINE HCl, Sleep, (SLEEP-AID) 50 MG CAPS Take 50 mg by mouth at bedtime.    [provider]  DULoxetine (CYMBALTA) 60 MG capsule Take 1 capsule (60 mg total) by mouth daily. 06/19/21   Libby Maw, MD  glucose blood [facility] VERIO) test strip 1 each by Other route in the morning and at bedtime. Use as instructed 05/12/20   Janith Lima, MD  hydrALAZINE (APRESOLINE) 50 MG tablet TAKE 1 TABLET BY MOUTH TWICE A DAY 11/17/21   Libby Maw, MD  irbesartan (AVAPRO) 300 MG tablet TAKE 1 TABLET BY MOUTH EVERY DAY 11/17/21   Libby Maw, MD  Lancets [facility] ULTRASOFT) lancets 1 each by Other route in the morning and at bedtime. Use as instructed 05/12/20   Janith Lima, MD  levothyroxine (SYNTHROID) 88 MCG tablet Take 1 tablet (88 mcg total) by mouth daily. 07/08/21   Libby Maw, MD  metFORMIN (GLUCOPHAGE)  500 MG tablet TAKE 1 TABLET BY MOUTH TWICE A DAY WITH MEALS 11/17/21   Libby Maw, MD  OnabotulinumtoxinA (BOTOX IJ) Inject as directed as needed (spastic voic).    [provider]  oxymetazoline (VICKS SINEX 12 HOUR DECONGEST) 0.05 % nasal spray Place 1 spray into both nostrils daily as needed for congestion.    [provider]  torsemide (DEMADEX) 20 MG tablet TAKE 1 TABLET BY MOUTH EVERY DAY 11/17/21   Libby Maw, MD  warfarin (COUMADIN) 5 MG tablet TAKE 1 TABLET (5 MG TOTAL) BY MOUTH DAILY. Patient taking differently: Take 2.5-5 mg by mouth See admin instructions. Take 2.5 mg mon., Wed., and Friday, all the other days take 5 mg 11/17/21   Evans Lance, MD    Family History Family History  Problem Relation Age of Onset   Hypertension Mother    Diabetes Mother    Heart attack Mother    Colon polyps Mother  Hypertension Father    Heart attack Father    Stroke Father    Breast cancer Sister 69   Breast cancer Maternal Aunt        in 30's   Breast cancer Maternal Aunt    Cancer Maternal Uncle        Lung   Hypertension Brother    Cancer Brother        Prostate   Diabetes Brother    Dementia Brother    Stroke Brother    Heart attack Brother    Prostate cancer Brother    Hypertension Son    Colon cancer Neg Hx    Esophageal cancer Neg Hx    Rectal cancer Neg Hx    Stomach cancer Neg Hx    Social History Social History   Tobacco Use   Smoking status: Never   Smokeless tobacco: Never  Vaping Use   Vaping Use: Never used  Substance Use Topics   Alcohol use: No   Drug use: No   Allergies   Amlodipine, Demeclocycline, Doxycycline, Erythromycin, Lisinopril, and Tetracyclines & related  Review of Systems Review of Systems Pertinent findings revealed after performing a 14 point review of systems has been noted in the history of present illness.  Physical Exam Triage Vital Signs ED Triage Vitals  Enc Vitals Group     BP  12/19/20 0827 (!) 147/82     Pulse Rate 12/19/20 0827 72     Resp 12/19/20 0827 18     Temp 12/19/20 0827 98.3 F (36.8 C)     Temp Source 12/19/20 0827 Oral     SpO2 12/19/20 0827 98 %     Weight --      Height --      Head Circumference --      Peak Flow --      Pain Score 12/19/20 0826 5     Pain Loc --      Pain Edu? --      Excl. in [place]? --   No data found.  Updated Vital Signs BP 134/72 (BP Location: Right Arm)   Pulse 66   Temp 97.6 F (36.4 C) (Oral)   Resp 16   SpO2 95%   Physical Exam Vitals and nursing note reviewed.  Constitutional:      General: She is awake. She is not in acute distress.    Appearance: Normal appearance. She is well-developed and well-groomed. She is ill-appearing.  HENT:     Head: Normocephalic and atraumatic.     Salivary Glands: Right salivary gland is not diffusely enlarged or tender. Left salivary gland is not diffusely enlarged or tender.     Right Ear: Ear canal and external ear normal. No drainage. No middle ear effusion. There is no impacted cerumen. Tympanic membrane is bulging. Tympanic membrane is not injected or erythematous.     Left Ear: Ear canal and external ear normal. No drainage.  No middle ear effusion. There is no impacted cerumen. Tympanic membrane is bulging. Tympanic membrane is not injected or erythematous.     Ears:     Comments: Bilateral EACs normal, both TMs bulging with clear fluid    Nose: Rhinorrhea present. No nasal deformity, septal deviation, signs of injury, nasal tenderness, mucosal edema or congestion. Rhinorrhea is clear.     Right Nostril: Occlusion present. No foreign body, epistaxis or septal hematoma.     Left Nostril: Occlusion present. No foreign body, epistaxis or septal hematoma.  Right Turbinates: Swollen and pale. Not enlarged.     Left Turbinates: Swollen and pale. Not enlarged.     Right Sinus: No maxillary sinus tenderness or frontal sinus tenderness.     Left Sinus: No maxillary sinus  tenderness or frontal sinus tenderness.     Mouth/Throat:     Lips: Pink. No lesions.     Mouth: Mucous membranes are moist. No oral lesions.     Tongue: No lesions. Tongue does not deviate from midline.     Palate: No mass and lesions.     Pharynx: Oropharynx is clear. Uvula midline. No pharyngeal swelling, oropharyngeal exudate, posterior oropharyngeal erythema or uvula swelling.     Tonsils: No tonsillar exudate. 0 on the right. 0 on the left.     Comments: Postnasal drip Eyes:     General: Lids are normal. Allergic shiner present.        Right eye: No discharge.        Left eye: No discharge.     Extraocular Movements: Extraocular movements intact.     Conjunctiva/sclera: Conjunctivae normal.     Right eye: Right conjunctiva is not injected.     Left eye: Left conjunctiva is not injected.     Pupils: Pupils are equal, round, and reactive to light.  Neck:     Trachea: Trachea and phonation normal.  Cardiovascular:     Rate and Rhythm: Normal rate and regular rhythm.     Pulses: Normal pulses.     Heart sounds: Normal heart sounds, S1 normal and S2 normal. Heart sounds not distant. No murmur heard.    No friction rub. No gallop. No S3 or S4 sounds.  Pulmonary:     Effort: Pulmonary effort is normal. Prolonged expiration present. No tachypnea, bradypnea, accessory muscle usage or respiratory distress.     Breath sounds: No stridor, decreased air movement or transmitted upper airway sounds. Examination of the right-upper field reveals wheezing. Examination of the left-upper field reveals wheezing. Examination of the right-middle field reveals wheezing. Examination of the left-middle field reveals wheezing. Wheezing present. No decreased breath sounds, rhonchi or rales.  Chest:     Chest wall: No tenderness.  Musculoskeletal:        General: Normal range of motion.     Cervical back: Full passive range of motion without pain, normal range of motion and neck supple. Normal range of  motion.     Right lower leg: 1+ Pitting Edema present.     Left lower leg: 1+ Pitting Edema present.  Lymphadenopathy:     Cervical: No cervical adenopathy.  Skin:    General: Skin is warm and dry.     Findings: No erythema or rash.  Neurological:     General: No focal deficit present.     Mental Status: She is alert and oriented to person, place, and time.  Psychiatric:        Mood and Affect: Mood normal.        Behavior: Behavior normal. Behavior is cooperative.     Visual Acuity Right Eye Distance:   Left Eye Distance:   Bilateral Distance:    Right Eye Near:   Left Eye Near:    Bilateral Near:     [facility] Couse / Diagnostics / Procedures:     Radiology DG Chest 2 View  Result Date: 02/11/2022 CLINICAL DATA:  Prolonged expiration. History of pneumonia. Wheezing and decreased breath sounds bilaterally and within the lower lung fields. EXAM: CHEST -  2 VIEW COMPARISON:  Chest two views 08/19/2016 FINDINGS: Cardiac silhouette and mediastinal contours are within normal limits with mild calcification within the aortic arch. Mild horizontal linear subsegmental atelectasis versus scarring within the left mid to lower lung appears similar to 08/19/2016. No definite abnormal airspace opacity is seen on lateral view. No pleural effusion pneumothorax. Mild-to-moderate multilevel degenerative disc changes of the thoracic spine. IMPRESSION: Mild linear subsegmental atelectasis versus scarring within the left mid to lower lung appears similar to 08/19/2016. No definite acute lung process. Electronically Signed   By: Yvonne Kendall M.D.   On: 02/11/2022 12:09    Procedures Procedures (including critical care time) EKG  Pending results:  Labs Reviewed - No data to display  Medications Ordered in UC: Medications  albuterol (PROVENTIL) (2.5 MG/3ML) 0.083% nebulizer solution 2.5 mg (2.5 mg Nebulization Given 02/11/22 1207)    UC Diagnoses / Final Clinical Impressions(s)   I have reviewed  the triage vital signs and the nursing notes.  Pertinent labs & imaging results that were available during my care of the patient were reviewed by me and considered in my medical decision making (see chart for details).    Final diagnoses:  Pneumonia of left upper lobe due to infectious organism  Perennial allergic rhinitis with seasonal variation  Wheezing   Patient advised of physical exam findings.  Patient reported improved work of breathing after nebulized bronchodilator treatment.  Patient advised to continue albuterol at home.  Patient provided with prescriptions for amoxicillin and azithromycin based on my personal read of her chest x-ray as well as physical exam findings, patient advised to radiologist report.  Patient also advised to begin Xyzal and Flonase to calm respiratory inflammation and promote healing.  Promethazine DM provided for nighttime cough.  Conservative care recommended.  Return precautions advised. Please see discharge instructions below for further details of plan of care as provided to patient. ED Prescriptions     Medication Sig Dispense Auth. Provider   amoxicillin-clavulanate (AUGMENTIN) 875-125 MG tablet Take 1 tablet by mouth 2 (two) times daily for 5 days. 10 tablet Lynden Oxford Scales, PA-C   azithromycin (ZITHROMAX) 250 MG tablet Take 2 tablets (500 mg total) by mouth daily for 1 day, THEN 1 tablet (250 mg total) daily for 4 days. 6 tablet Lynden Oxford Scales, PA-C   levocetirizine (XYZAL) 5 MG tablet Take 1 tablet (5 mg total) by mouth every evening. 90 tablet Lynden Oxford Scales, PA-C   fluticasone (FLONASE) 50 MCG/ACT nasal spray Place 1 spray into both nostrils daily. Begin by using 2 sprays in each nare daily for 3 to 5 days, then decrease to 1 spray in each nare daily. 31.6 mL Lynden Oxford Scales, PA-C   albuterol (VENTOLIN HFA) 108 (90 Base) MCG/ACT inhaler Inhale 2 puffs into the lungs every 6 (six) hours as needed for wheezing or shortness  of breath (Cough). 54 g Lynden Oxford Scales, PA-C   promethazine-dextromethorphan (PROMETHAZINE-DM) 6.25-15 MG/5ML syrup Take 5 mLs by mouth 4 (four) times daily as needed for cough. 118 mL Lynden Oxford Scales, PA-C      PDMP not reviewed this encounter.  Disposition Upon Discharge:  Condition: stable for discharge home Home: take medications as prescribed; routine discharge instructions as discussed; follow up as advised.  Patient presented with an acute illness with associated systemic symptoms and significant discomfort requiring urgent management. In my opinion, this is a condition that a prudent lay person (someone who possesses an average knowledge of health and medicine) may  potentially expect to result in complications if not addressed urgently such as respiratory distress, impairment of bodily function or dysfunction of bodily organs.   Routine symptom specific, illness specific and/or disease specific instructions were discussed with the patient and/or caregiver at length.   As such, the patient has been evaluated and assessed, work-up was performed and treatment was provided in alignment with urgent care protocols and evidence based medicine.  Patient/parent/caregiver has been advised that the patient may require follow up for further testing and treatment if the symptoms continue in spite of treatment, as clinically indicated and appropriate.  If the patient was tested for COVID-19, Influenza and/or RSV, then the patient/parent/guardian was advised to isolate at home pending the results of his/her diagnostic coronavirus test and potentially longer if they're positive. I have also advised pt that if his/her COVID-19 test returns positive, it's recommended to self-isolate for at least 10 days after symptoms first appeared AND until fever-free for 24 hours without fever reducer AND other symptoms have improved or resolved. Discussed self-isolation recommendations as well as  instructions for household member/close contacts as per the [facility] and [place] [facility], and also gave patient the [place] packet with this information.  Patient/parent/caregiver has been advised to return to the [facility] or PCP in 3-5 days if no better; to PCP or the Emergency Department if new signs and symptoms develop, or if the current signs or symptoms continue to change or worsen for further workup, evaluation and treatment as clinically indicated and appropriate  The patient will follow up with their current PCP if and as advised. If the patient does not currently have a PCP we will assist them in obtaining one.   The patient may need specialty follow up if the symptoms continue, in spite of conservative treatment and management, for further workup, evaluation, consultation and treatment as clinically indicated and appropriate.  Patient/parent/caregiver verbalized understanding and agreement of plan as discussed.  All questions were addressed during visit.  Please see discharge instructions below for further details of plan.  Discharge Instructions:   Discharge Instructions      Your upper respiratory symptoms are concerning for uncontrolled respiratory allergies.  Your lower respiratory symptoms are concerning for possible pneumonia based on my physical exam findings and x-ray imaging.  Please read below to learn more about the medications, dosages and frequencies that I recommend to help alleviate your symptoms and to get you feeling better soon:   Augmentin (amoxicillin - clavulanic acid): For treatment of pneumonia, please take 1 tablet twice daily for 5 days, you can take it with or without food.  This antibiotic can cause upset stomach, this will resolve once antibiotics are complete.  You are welcome to use a probiotic, eat yogurt, take Imodium while taking this medication.  Please avoid other systemic medications such as Maalox, Pepto-Bismol or milk of magnesia as they can interfere with your body's  ability to absorb the antibiotics.  Z-Pak (azithromycin): Treatment of pneumonia, please take 2 tablets the first day, then take 1 tablet daily every day thereafter until complete.  This medication does interfere with Coumadin.  Please contact your Coumadin clinic today for recommendations for dosing of Coumadin and when to repeat your INR.       Xyzal (levocetirizine): This is an excellent second-generation antihistamine that helps to reduce respiratory inflammatory response to environmental allergens.  In some patients, this medication can cause daytime sleepiness so I recommend that you take 1 tablet daily at bedtime.  Flonase (fluticasone): This is a steroid nasal spray that you use once daily, 1 spray in each nare.  This medication does not work well if you decide to use it only used as you feel you need to, it works best used on a daily basis.  After 3 to 5 days of use, you will notice significant reduction of the inflammation and mucus production that is currently being caused by exposure to allergens, whether seasonal or environmental.  The most common side effect of this medication is nosebleeds.  If you experience a nosebleed, please discontinue use for 1 week, then feel free to resume.  I have provided you with a prescription.     ProAir, Ventolin, Proventil (albuterol): This inhaled medication contains a short acting beta agonist bronchodilator.  This medication works on the smooth muscle that opens and constricts of your airways by relaxing the muscle.  The result of relaxation of the smooth muscle is increased air movement and improved work of breathing.  This is a short acting medication that can be used every 4-6 hours as needed for increased work of breathing, shortness of breath, wheezing and excessive coughing.  I have provided you with a prescription.     Robitussin, Mucinex (guaifenesin): This is an expectorant.  This helps break up chest congestion and loosen up thick nasal  drainage making phlegm and drainage more liquid and therefore easier to remove.  I recommend being 400 mg three times daily as needed.      Promethazine DM: Promethazine is both a nasal decongestant and an antinausea medication that makes most patients feel fairly sleepy.  The DM is dextromethorphan, a cough suppressant found in many over-the-counter cough medications.  Please take 5 mL before bedtime to minimize your cough which will help you sleep better.  I have sent a prescription for this medication to your pharmacy.   Please follow-up within the next 5-7 days either with your primary care provider or urgent care if your symptoms do not resolve.  If you do not have a primary care provider, we will assist you in finding one.        Thank you for visiting urgent care today.  We appreciate the opportunity to participate in your care.       This office note has been dictated using Museum/gallery curator.  Unfortunately, this method of dictation can sometimes lead to typographical or grammatical errors.  I apologize for your inconvenience in advance if this occurs.  Please do not hesitate to reach out to me if clarification is needed.      Lynden Oxford Scales, PA-C 02/12/22 1525

## 2022-02-11 NOTE — ED Notes (Signed)
Provider wants x-ray completed before albuterol nebulizer treatment.

## 2022-02-11 NOTE — Discharge Instructions (Signed)
Your upper respiratory symptoms are concerning for uncontrolled respiratory allergies.  Your lower respiratory symptoms are concerning for possible pneumonia based on my physical exam findings and x-ray imaging.  Please read below to learn more about the medications, dosages and frequencies that I recommend to help alleviate your symptoms and to get you feeling better soon:   Augmentin (amoxicillin - clavulanic acid): For treatment of pneumonia, please take 1 tablet twice daily for 5 days, you can take it with or without food.  This antibiotic can cause upset stomach, this will resolve once antibiotics are complete.  You are welcome to use a probiotic, eat yogurt, take Imodium while taking this medication.  Please avoid other systemic medications such as Maalox, Pepto-Bismol or milk of magnesia as they can interfere with your body's ability to absorb the antibiotics.  Z-Pak (azithromycin): Treatment of pneumonia, please take 2 tablets the first day, then take 1 tablet daily every day thereafter until complete.  This medication does interfere with Coumadin.  Please contact your Coumadin clinic today for recommendations for dosing of Coumadin and when to repeat your INR.       Xyzal (levocetirizine): This is an excellent second-generation antihistamine that helps to reduce respiratory inflammatory response to environmental allergens.  In some patients, this medication can cause daytime sleepiness so I recommend that you take 1 tablet daily at bedtime.     Flonase (fluticasone): This is a steroid nasal spray that you use once daily, 1 spray in each nare.  This medication does not work well if you decide to use it only used as you feel you need to, it works best used on a daily basis.  After 3 to 5 days of use, you will notice significant reduction of the inflammation and mucus production that is currently being caused by exposure to allergens, whether seasonal or environmental.  The most common side effect of  this medication is nosebleeds.  If you experience a nosebleed, please discontinue use for 1 week, then feel free to resume.  I have provided you with a prescription.     ProAir, Ventolin, Proventil (albuterol): This inhaled medication contains a short acting beta agonist bronchodilator.  This medication works on the smooth muscle that opens and constricts of your airways by relaxing the muscle.  The result of relaxation of the smooth muscle is increased air movement and improved work of breathing.  This is a short acting medication that can be used every 4-6 hours as needed for increased work of breathing, shortness of breath, wheezing and excessive coughing.  I have provided you with a prescription.     Robitussin, Mucinex (guaifenesin): This is an expectorant.  This helps break up chest congestion and loosen up thick nasal drainage making phlegm and drainage more liquid and therefore easier to remove.  I recommend being 400 mg three times daily as needed.      Promethazine DM: Promethazine is both a nasal decongestant and an antinausea medication that makes most patients feel fairly sleepy.  The DM is dextromethorphan, a cough suppressant found in many over-the-counter cough medications.  Please take 5 mL before bedtime to minimize your cough which will help you sleep better.  I have sent a prescription for this medication to your pharmacy.   Please follow-up within the next 5-7 days either with your primary care provider or urgent care if your symptoms do not resolve.  If you do not have a primary care provider, we will assist you in finding one.  Thank you for visiting urgent care today.  We appreciate the opportunity to participate in your care.

## 2022-02-11 NOTE — ED Triage Notes (Signed)
Pt c/o chest congestion for 2 weeks, that has progressed to head congestion, ear fullness, headache, cough.   Home interventions: mucinex

## 2022-02-12 ENCOUNTER — Ambulatory Visit (INDEPENDENT_AMBULATORY_CARE_PROVIDER_SITE_OTHER): Payer: Medicare Other

## 2022-02-12 DIAGNOSIS — I10 Essential (primary) hypertension: Secondary | ICD-10-CM | POA: Diagnosis not present

## 2022-02-12 DIAGNOSIS — Z5181 Encounter for therapeutic drug level monitoring: Secondary | ICD-10-CM

## 2022-02-12 DIAGNOSIS — E782 Mixed hyperlipidemia: Secondary | ICD-10-CM | POA: Diagnosis not present

## 2022-02-12 DIAGNOSIS — I4891 Unspecified atrial fibrillation: Secondary | ICD-10-CM | POA: Diagnosis not present

## 2022-02-12 DIAGNOSIS — N189 Chronic kidney disease, unspecified: Secondary | ICD-10-CM | POA: Diagnosis not present

## 2022-02-12 DIAGNOSIS — Z6830 Body mass index (BMI) 30.0-30.9, adult: Secondary | ICD-10-CM | POA: Diagnosis not present

## 2022-02-12 DIAGNOSIS — E669 Obesity, unspecified: Secondary | ICD-10-CM | POA: Diagnosis not present

## 2022-02-12 DIAGNOSIS — E1122 Type 2 diabetes mellitus with diabetic chronic kidney disease: Secondary | ICD-10-CM | POA: Diagnosis not present

## 2022-02-12 LAB — POCT INR: INR: 2.5 (ref 2.0–3.0)

## 2022-02-12 NOTE — Patient Instructions (Signed)
*  Watchman scheduled for 03/04/2022 *   continue taking Warfarin 1 tablet daily except for 1/2 tablet on Mondays, Wednesdays and Fridays.  Recheck INR in 1 week.  Coumadin Clinic [phone]

## 2022-02-13 ENCOUNTER — Other Ambulatory Visit: Payer: Self-pay | Admitting: Family Medicine

## 2022-02-13 DIAGNOSIS — E119 Type 2 diabetes mellitus without complications: Secondary | ICD-10-CM

## 2022-02-13 DIAGNOSIS — I10 Essential (primary) hypertension: Secondary | ICD-10-CM

## 2022-02-19 ENCOUNTER — Ambulatory Visit: Payer: Medicare Other | Attending: Cardiovascular Disease | Admitting: *Deleted

## 2022-02-19 DIAGNOSIS — Z5181 Encounter for therapeutic drug level monitoring: Secondary | ICD-10-CM

## 2022-02-19 DIAGNOSIS — I4891 Unspecified atrial fibrillation: Secondary | ICD-10-CM | POA: Diagnosis not present

## 2022-02-19 LAB — POCT INR: INR: 2.1 (ref 2.0–3.0)

## 2022-02-24 NOTE — Progress Notes (Signed)
[facility]                                     Cardiology Office Note:    Date:  03/01/2022   ID:  Ashley Woodard, DOB 06-20-1950, MRN [id number]  PCP:  Ashley Sax, MD  [facility] Cardiologist:  None  [facility] Electrophysiologist:  Ashley Prude, MD / Dr. Ladona Ridgel, MD   Referring MD: Ashley Woodard,*   Chief Complaint  Patient presents with   Follow-up    Pre LAAO    History of Present Illness:    Ashley Woodard is a 72 y.o. female with a hx of paroxysmal atrial fibrillation on Coumadin s/p AF ablation 2019, HTN, and DM2. She has been followed by Dr. Ladona Woodard for her AF and was referred to Dr. Lalla Woodard for the evaluation of LAAO placement due to frequent falls and poor medication compliance.    Ashley Woodard underwent pre-LAAO CT imaging which showed anatomy suitable for Watchman implant and is here today alone for pre-Watchman evaluation. She has been well with no specific complaints today including chest pain, palpitations, LE edema, orthopnea, dizziness, or syncope. No bleeding in stool or urine.    She has undergone weekly INR checks leading up to procedure. Last week INR was noted to be 1.9 therefore Warfarin dosing was increased per Coumadin Clinic. Procedure is scheduled for this Thursday.   Past Medical History:  Diagnosis Date   Abnormal cardiovascular stress test 08/21/2016   a. done for abnl EKG/cardiac risk factors -> admitted for cath 07/2016 which showed no significant CAD, normal LV contraction, mildly elevated filling pressure. Low dose Imdur was added for possible component of microvascular dysfunction.    Allergy    Anxiety    Arthritis    "back, knees, fingers" (05/10/2017)   Chronic lower back pain    Colon polyps    Deafness in right ear    Depression    Diverticulosis    Hx of adenomatous colonic polyps 05/08/2003   Hypertension    Hypertriglyceridemia    Hypothyroidism    Migraine    "none in the 2000s" (05/10/2017)    Osteoporosis    Paroxysmal atrial fibrillation (HCC)    Pneumonia    "several times" (05/10/2017)   PONV (postoperative nausea and vomiting)    S/P cardiac catheterization, 08/20/16 minimal CAD 08/21/2016   Type II diabetes mellitus (HCC)    Voice tremor     Past Surgical History:  Procedure Laterality Date   ABDOMINAL HYSTERECTOMY  1988   ATRIAL FIBRILLATION ABLATION N/A 05/10/2017   Procedure: ATRIAL FIBRILLATION ABLATION;  Surgeon: Ashley Range, MD;  Location: [facility];  Service: Cardiovascular;  Laterality: N/A;   COCHLEAR IMPLANT Right 2008   COLONOSCOPY     LEFT HEART CATH AND CORONARY ANGIOGRAPHY N/A 08/20/2016   Procedure: Left Heart Cath and Coronary Angiography;  Surgeon: Ashley Kendall, MD;  Location: [facility];  Service: Cardiovascular;  Laterality: N/A;   LOOP RECORDER INSERTION N/A 11/29/2017   Procedure: LOOP RECORDER INSERTION;  Surgeon: Ashley Range, MD;  Location: [facility];  Service: Cardiovascular;  Laterality: N/A;   ORIF ANKLE FRACTURE Right 02/06/2019   Procedure: OPEN REDUCTION INTERNAL FIXATION (ORIF) ANKLE FRACTURE LATERAL MALLEOLUS, SYNDESMOSIS, RIGHT;  Surgeon: Ashley Hart, MD;  Location: [facility];  Service: Orthopedics;  Laterality: Right;  SURGERY REQUEST TIME: 1.5 HOURS  CPT CODES: 01027, 27829, 25366, 27610    Current Medications: Current Meds  Medication Sig   amLODipine (NORVASC) 10 MG tablet Take 1 tablet (10 mg total) by mouth daily.   atorvastatin (LIPITOR) 10 MG tablet Take 1 tablet (10 mg total) by mouth daily.   Blood Glucose Calibration (ONETOUCH VERIO) SOLN 1 Act by In Vitro route in the morning and at bedtime.   Blood Glucose Monitoring Suppl (ONETOUCH VERIO FLEX SYSTEM) w/Device KIT 1 Act by Does not apply route in the morning and at bedtime.   diclofenac Sodium (VOLTAREN) 1 % GEL Apply a small grape sized dollop to tender to meet up to 4 times daily as needed   diphenhydrAMINE HCl,  Sleep, (SLEEP-AID) 50 MG CAPS Take 50 mg by mouth at bedtime.   DULoxetine (CYMBALTA) 60 MG capsule Take 1 capsule (60 mg total) by mouth daily.   fluticasone (FLONASE) 50 MCG/ACT nasal spray Place 1 spray into both nostrils daily. Begin by using 2 sprays in each nare daily for 3 to 5 days, then decrease to 1 spray in each nare daily.   glucose blood (ONETOUCH VERIO) test strip 1 each by Other route in the morning and at bedtime. Use as instructed   hydrALAZINE (APRESOLINE) 50 MG tablet TAKE 1 TABLET BY MOUTH TWICE A DAY   irbesartan (AVAPRO) 300 MG tablet TAKE 1 TABLET BY MOUTH EVERY DAY   Lancets (ONETOUCH ULTRASOFT) lancets 1 each by Other route in the morning and at bedtime. Use as instructed   levothyroxine (SYNTHROID) 88 MCG tablet Take 1 tablet (88 mcg total) by mouth daily.   metFORMIN (GLUCOPHAGE) 500 MG tablet TAKE 1 TABLET BY MOUTH TWICE A DAY WITH FOOD   OnabotulinumtoxinA (BOTOX IJ) Inject as directed as needed (spastic voic).   torsemide (DEMADEX) 20 MG tablet TAKE 1 TABLET BY MOUTH EVERY DAY   warfarin (COUMADIN) 5 MG tablet TAKE 1 TABLET (5 MG TOTAL) BY MOUTH DAILY. (Patient taking differently: Take 2.5-5 mg by mouth See admin instructions. Take 2.5 mg mon., Wed., and Friday, all the other days take 5 mg)   Current Facility-Administered Medications for the 03/01/22 encounter (Office Visit) with [facility]  Medication   botulinum toxin Type A (BOTOX) injection 100 Units     Allergies:   Amlodipine, Demeclocycline, Doxycycline, Erythromycin, Lisinopril, and Tetracyclines & related   Social History   Socioeconomic History   Marital status: Married    Spouse name: Not on file   Number of children: 2   Years of education: 12   Highest education level: Not on file  Occupational History   Occupation: Health and safety inspector  Tobacco Use   Smoking status: Never   Smokeless tobacco: Never  Vaping Use   Vaping Use: Never used  Substance and Sexual Activity    Alcohol use: No   Drug use: No   Sexual activity: Not Currently  Other Topics Concern   Not on file  Social History Narrative   Lives at home with husband in [place].   Right-handed.   No caffeine use.   Manages a call center   Social Determinants of Health   Financial Resource Strain: Low Risk  (05/20/2021)   Overall Financial Resource Strain (CARDIA)    Difficulty of Paying Living Expenses: Not hard at all  Food Insecurity: No Food Insecurity (05/20/2021)   Hunger Vital Sign    Worried About Running Out of Food in the Last Year: Never true  Ran Out of Food in the Last Year: Never true  Transportation Needs: No Transportation Needs (05/20/2021)   PRAPARE - Hydrologist (Medical): No    Lack of Transportation (Non-Medical): No  Physical Activity: Inactive (05/20/2021)   Exercise Vital Sign    Days of Exercise per Week: 0 days    Minutes of Exercise per Session: 0 min  Stress: No Stress Concern Present (05/20/2021)   [place]    Feeling of Stress : Not at all  Social Connections: Moderately Integrated (05/20/2021)   Social Connection and Isolation Panel [NHANES]    Frequency of Communication with Friends and Family: Twice a week    Frequency of Social Gatherings with Friends and Family: Twice a week    Attends Religious Services: Never    Marine scientist or Organizations: Yes    Attends Music therapist: 1 to 4 times per year    Marital Status: Married     Family History: The patient's family history includes Breast cancer in her maternal aunt and maternal aunt; Breast cancer (age of onset: 17) in her sister; Cancer in her brother and maternal uncle; Colon polyps in her mother; Dementia in her brother; Diabetes in her brother and mother; Heart attack in her brother, father, and mother; Hypertension in her brother, father, mother, and son; Prostate cancer in her  brother; Stroke in her brother and father. There is no history of Colon cancer, Esophageal cancer, Rectal cancer, or Stomach cancer.  ROS:   Please see the history of present illness.    All other systems reviewed and are negative.  EKGs/Labs/Other Studies Reviewed:    The following studies were reviewed today:  CT 2021/11/15:   IMPRESSION: 1. The left atrial appendage is large broccoli type with two lobes.   2. A 31 mm Watchman FLX device is recommended based on the above landing zone measurements (26.8 mm maximum diameter; 14% compression).   3. There is no thrombus in the left atrial appendage.   4. An inferior posterior IAS puncture site is recommended.   5. Optimal deployment angle: RAO 10 CRA 22   6. CAC score of 0.   7. Dilated pulmonary artery suggestive of pulmonary hypertension.  EKG:  EKG is ordered today.  The ekg ordered today demonstrates NSR, HR 67bpm.   Recent Labs: 03/27/2021: TSH 1.29 11/25/2021: BUN 26; Creatinine, Ser 1.30; Hemoglobin 11.3; Platelets 344; Potassium 3.6; Sodium 142  Recent Lipid Panel    Component Value Date/Time   CHOL 113 10/23/2021 1024   TRIG 175.0 (H) 10/23/2021 1024   HDL 41.50 10/23/2021 1024   CHOLHDL 3 10/23/2021 1024   VLDL 35.0 10/23/2021 1024   LDLCALC 36 10/23/2021 1024   LDLCALC 41 01/05/2019 1512   LDLDIRECT 44.0 03/27/2021 0812   Risk Assessment/Calculations:    HAS-BLED score 2 Hypertension Yes  Abnormal renal and liver function (Dialysis, transplant, Cr >2.26 mg/dL /Cirrhosis or Bilirubin >2x Normal or AST/ALT/AP >3x Normal) No  Stroke No  Bleeding No  Labile INR (Unstable/high INR) No  Elderly (>65) Yes  Drugs or alcohol (? 8 drinks/week, anti-plt or NSAID) No    CHA2DS2-VASc Score = 4  The patient's score is based upon: CHF History: 0 HTN History: 1 Diabetes History: 1 Stroke History: 0 Vascular Disease History: 0 Age Score: 1 Gender Score: 1  Physical Exam:    VS:  BP 134/64   Pulse 67  Ht 5\' 7"   (1.702 m)   Wt 183 lb (83 kg)   SpO2 99%   BMI 28.66 kg/m     Wt Readings from Last 3 Encounters:  03/01/22 183 lb (83 kg)  12/08/21 193 lb (87.5 kg)  11/25/21 193 lb (87.5 kg)    General: Well developed, well nourished, NAD Lungs:Clear to ausculation bilaterally. No wheezes, rales, or rhonchi. Breathing is unlabored. Cardiovascular: RRR with S1 S2. No murmurs, Extremities: No edema.  Neuro: Alert and oriented. No focal deficits. No facial asymmetry. MAE spontaneously. Psych: Responds to questions appropriately with normal affect.    ASSESSMENT/PLAN:    PAF: s/p AF ablation in 2019 and reports no recurrent AF symptoms. She has had frequent falls and reports poor compliance with anticoagulation and was referred to Dr. 2020 for LAAO implant scheduled for 03/04/22. She has had stable INRs leading up to her procedure with the exception of last week being 1.9. Will obtain repeat INR today along with CBC, BMET. Pre instructions reviewed with understanding. CHG soap given.    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 She is asymptomatic at this time.    HTN: Stable with no changes today  Medication Adjustments/Labs and Tests Ordered: Current medicines are reviewed at length with the patient today.  Concerns regarding medicines are outlined above.  Orders Placed This Encounter  Procedures   Basic metabolic panel   CBC   Protime-INR   EKG 12-Lead   No orders of the defined types were placed in this encounter.   Patient Instructions  Medication Instructions:  Your physician recommends that you continue on your current medications as directed. Please refer to the Current Medication list given to you today.  *If you need a refill on your cardiac medications before your next appointment, please call your pharmacy*   Lab Work: TODAY: BMET, CBC, INR If you have labs (blood work) drawn today and your tests are completely normal, you will receive your results only  by: MyChart Message (if you have MyChart) OR A paper copy in the mail If you have any lab test that is abnormal or we need to change your treatment, we will call you to review the results.   Testing/Procedures: NONE   Follow-Up: At [facility], you and your health needs are our priority.  As part of our continuing mission to provide you with exceptional heart care, we have created designated Provider Care Teams.  These Care Teams include your primary Cardiologist (physician) and Advanced Practice Providers (APPs -  Physician Assistants and Nurse Practitioners) who all work together to provide you with the care you need, when you need it.  We recommend signing up for the patient portal called "MyChart".  Sign up information is provided on this After Visit Summary.  MyChart is used to connect with patients for Virtual Visits (Telemedicine).  Patients are able to view lab/test results, encounter notes, upcoming appointments, etc.  Non-urgent messages can be sent to your provider as well.   To learn more about what you can do with MyChart, go to [facility].    Your next appointment:   SOMEONE WILL CALL YOU TO SCHEDULE AND FOLLOW-UP TIME   Important Information About Sugar         [email or web address], NP  03/01/2022 12:07 PM    [facility]

## 2022-02-26 ENCOUNTER — Telehealth: Payer: Self-pay | Admitting: Cardiology

## 2022-02-26 ENCOUNTER — Ambulatory Visit: Payer: Medicare Other | Attending: Cardiology

## 2022-02-26 DIAGNOSIS — Z5181 Encounter for therapeutic drug level monitoring: Secondary | ICD-10-CM

## 2022-02-26 DIAGNOSIS — Z6830 Body mass index (BMI) 30.0-30.9, adult: Secondary | ICD-10-CM | POA: Diagnosis not present

## 2022-02-26 DIAGNOSIS — E1122 Type 2 diabetes mellitus with diabetic chronic kidney disease: Secondary | ICD-10-CM | POA: Diagnosis not present

## 2022-02-26 DIAGNOSIS — I10 Essential (primary) hypertension: Secondary | ICD-10-CM | POA: Diagnosis not present

## 2022-02-26 DIAGNOSIS — E669 Obesity, unspecified: Secondary | ICD-10-CM | POA: Diagnosis not present

## 2022-02-26 DIAGNOSIS — N189 Chronic kidney disease, unspecified: Secondary | ICD-10-CM | POA: Diagnosis not present

## 2022-02-26 DIAGNOSIS — E782 Mixed hyperlipidemia: Secondary | ICD-10-CM | POA: Diagnosis not present

## 2022-02-26 DIAGNOSIS — I4891 Unspecified atrial fibrillation: Secondary | ICD-10-CM | POA: Diagnosis not present

## 2022-02-26 LAB — POCT INR: INR: 1.9 — AB (ref 2.0–3.0)

## 2022-02-26 NOTE — Patient Instructions (Signed)
*  Watchman scheduled for 03/04/2022 *  TAKE 1.5 TABLETS TODAY ONLY THEN  continue taking Warfarin 1 tablet daily except for 1/2 tablet on Mondays, Wednesdays and Fridays.  Recheck INR in 1 week.  Coumadin Clinic [phone]

## 2022-02-26 NOTE — Telephone Encounter (Signed)
Patient seen by Coumadin Clinic today for INR check which came back low at 1.9. Instructed to take 1.5 tablets today only then continue taking Warfarin 1 tablet daily except for 1/2 tablet on Mondays, Wednesdays and Fridays. Recheck INR in 1 week.     Kathyrn Drown NP-C Structural Heart Team  Phone: [phone]

## 2022-03-01 ENCOUNTER — Ambulatory Visit: Payer: Medicare Other

## 2022-03-01 ENCOUNTER — Ambulatory Visit (INDEPENDENT_AMBULATORY_CARE_PROVIDER_SITE_OTHER): Payer: Medicare Other | Admitting: Cardiology

## 2022-03-01 VITALS — BP 134/64 | HR 67 | Ht 67.0 in | Wt 183.0 lb

## 2022-03-01 DIAGNOSIS — F418 Other specified anxiety disorders: Secondary | ICD-10-CM | POA: Diagnosis not present

## 2022-03-01 DIAGNOSIS — F32A Depression, unspecified: Secondary | ICD-10-CM | POA: Diagnosis present

## 2022-03-01 DIAGNOSIS — Z823 Family history of stroke: Secondary | ICD-10-CM | POA: Diagnosis not present

## 2022-03-01 DIAGNOSIS — Y9301 Activity, walking, marching and hiking: Secondary | ICD-10-CM | POA: Diagnosis present

## 2022-03-01 DIAGNOSIS — Z79890 Hormone replacement therapy (postmenopausal): Secondary | ICD-10-CM | POA: Diagnosis not present

## 2022-03-01 DIAGNOSIS — Z9889 Other specified postprocedural states: Secondary | ICD-10-CM | POA: Diagnosis not present

## 2022-03-01 DIAGNOSIS — Z79899 Other long term (current) drug therapy: Secondary | ICD-10-CM | POA: Diagnosis not present

## 2022-03-01 DIAGNOSIS — Z006 Encounter for examination for normal comparison and control in clinical research program: Secondary | ICD-10-CM | POA: Diagnosis not present

## 2022-03-01 DIAGNOSIS — Z888 Allergy status to other drugs, medicaments and biological substances status: Secondary | ICD-10-CM | POA: Diagnosis not present

## 2022-03-01 DIAGNOSIS — Z7984 Long term (current) use of oral hypoglycemic drugs: Secondary | ICD-10-CM | POA: Diagnosis not present

## 2022-03-01 DIAGNOSIS — Z8249 Family history of ischemic heart disease and other diseases of the circulatory system: Secondary | ICD-10-CM | POA: Diagnosis not present

## 2022-03-01 DIAGNOSIS — R296 Repeated falls: Secondary | ICD-10-CM | POA: Diagnosis present

## 2022-03-01 DIAGNOSIS — Z01818 Encounter for other preprocedural examination: Secondary | ICD-10-CM | POA: Diagnosis not present

## 2022-03-01 DIAGNOSIS — Z5181 Encounter for therapeutic drug level monitoring: Secondary | ICD-10-CM

## 2022-03-01 DIAGNOSIS — E119 Type 2 diabetes mellitus without complications: Secondary | ICD-10-CM | POA: Diagnosis not present

## 2022-03-01 DIAGNOSIS — I351 Nonrheumatic aortic (valve) insufficiency: Secondary | ICD-10-CM | POA: Diagnosis present

## 2022-03-01 DIAGNOSIS — E039 Hypothyroidism, unspecified: Secondary | ICD-10-CM | POA: Diagnosis not present

## 2022-03-01 DIAGNOSIS — W1830XA Fall on same level, unspecified, initial encounter: Secondary | ICD-10-CM | POA: Diagnosis present

## 2022-03-01 DIAGNOSIS — Z833 Family history of diabetes mellitus: Secondary | ICD-10-CM | POA: Diagnosis not present

## 2022-03-01 DIAGNOSIS — Z83719 Family history of colon polyps, unspecified: Secondary | ICD-10-CM | POA: Diagnosis not present

## 2022-03-01 DIAGNOSIS — Z8679 Personal history of other diseases of the circulatory system: Secondary | ICD-10-CM

## 2022-03-01 DIAGNOSIS — Z86010 Personal history of colonic polyps: Secondary | ICD-10-CM | POA: Diagnosis not present

## 2022-03-01 DIAGNOSIS — I10 Essential (primary) hypertension: Secondary | ICD-10-CM

## 2022-03-01 DIAGNOSIS — Z7901 Long term (current) use of anticoagulants: Secondary | ICD-10-CM | POA: Diagnosis not present

## 2022-03-01 DIAGNOSIS — Z881 Allergy status to other antibiotic agents status: Secondary | ICD-10-CM | POA: Diagnosis not present

## 2022-03-01 DIAGNOSIS — Q2112 Patent foramen ovale: Secondary | ICD-10-CM | POA: Diagnosis not present

## 2022-03-01 DIAGNOSIS — I4891 Unspecified atrial fibrillation: Secondary | ICD-10-CM

## 2022-03-01 DIAGNOSIS — I480 Paroxysmal atrial fibrillation: Secondary | ICD-10-CM | POA: Diagnosis not present

## 2022-03-01 DIAGNOSIS — Z803 Family history of malignant neoplasm of breast: Secondary | ICD-10-CM | POA: Diagnosis not present

## 2022-03-01 DIAGNOSIS — Z91148 Patient's other noncompliance with medication regimen for other reason: Secondary | ICD-10-CM | POA: Diagnosis not present

## 2022-03-01 DIAGNOSIS — Z90710 Acquired absence of both cervix and uterus: Secondary | ICD-10-CM | POA: Diagnosis not present

## 2022-03-01 DIAGNOSIS — Z9621 Cochlear implant status: Secondary | ICD-10-CM | POA: Diagnosis present

## 2022-03-01 LAB — CBC

## 2022-03-01 NOTE — Patient Instructions (Addendum)
Medication Instructions:  Your physician recommends that you continue on your current medications as directed. Please refer to the Current Medication list given to you today.  *If you need a refill on your cardiac medications before your next appointment, please call your pharmacy*   Lab Work: TODAY: BMET, CBC, INR If you have labs (blood work) drawn today and your tests are completely normal, you will receive your results only by: [place] (if you have MyChart) OR A paper copy in the mail If you have any lab test that is abnormal or we need to change your treatment, we will call you to review the results.   Testing/Procedures: NONE   Follow-Up: At [facility], you and your health needs are our priority.  As part of our continuing mission to provide you with exceptional heart care, we have created designated Provider Care Teams.  These Care Teams include your primary Cardiologist (physician) and Advanced Practice Providers (APPs -  Physician Assistants and Nurse Practitioners) who all work together to provide you with the care you need, when you need it.  We recommend signing up for the patient portal called "MyChart".  Sign up information is provided on this After Visit Summary.  MyChart is used to connect with patients for Virtual Visits (Telemedicine).  Patients are able to view lab/test results, encounter notes, upcoming appointments, etc.  Non-urgent messages can be sent to your provider as well.   To learn more about what you can do with MyChart, go to [email or web address].    Your next appointment:   SOMEONE WILL CALL YOU TO SCHEDULE AND FOLLOW-UP TIME   Important Information About Sugar

## 2022-03-02 LAB — BASIC METABOLIC PANEL
BUN/Creatinine Ratio: 19 (ref 12–28)
BUN: 26 mg/dL (ref 8–27)
CO2: 21 mmol/L (ref 20–29)
Calcium: 9.6 mg/dL (ref 8.7–10.3)
Chloride: 103 mmol/L (ref 96–106)
Creatinine, Ser: 1.37 mg/dL — ABNORMAL HIGH (ref 0.57–1.00)
Glucose: 110 mg/dL — ABNORMAL HIGH (ref 70–99)
Potassium: 3.4 mmol/L — ABNORMAL LOW (ref 3.5–5.2)
Sodium: 141 mmol/L (ref 134–144)
eGFR: 42 mL/min/{1.73_m2} — ABNORMAL LOW (ref >59)

## 2022-03-02 LAB — CBC
Hematocrit: 35.8 % (ref 34.0–46.6)
Hemoglobin: 11.7 g/dL (ref 11.1–15.9)
MCH: 28.3 pg (ref 26.6–33.0)
MCHC: 32.7 g/dL (ref 31.5–35.7)
MCV: 87 fL (ref 79–97)
Platelets: 315 10*3/uL (ref 150–450)
RBC: 4.13 x10E6/uL (ref 3.77–5.28)
RDW: 14.5 % (ref 11.7–15.4)
WBC: 7.5 10*3/uL (ref 3.4–10.8)

## 2022-03-02 LAB — PROTIME-INR
INR: 2.6 — ABNORMAL HIGH (ref 0.9–1.2)
Prothrombin Time: 26.3 s — ABNORMAL HIGH (ref 9.1–12.0)

## 2022-03-03 ENCOUNTER — Telehealth: Payer: Self-pay

## 2022-03-03 NOTE — Telephone Encounter (Signed)
Due to schedule changes, instructed the patient to arrive at 0900 for an 1130 case tomorrow instead of previous arrival time. She was grateful for call and agreed with plan.

## 2022-03-03 NOTE — Telephone Encounter (Signed)
Confirmed procedure date of 03/04/2022. Confirmed arrival time of 1100 for procedure time at 1330. Reviewed pre-procedure instructions with patient. The patient understands to call if questions/concerns arise prior to procedure.

## 2022-03-04 ENCOUNTER — Other Ambulatory Visit: Payer: Self-pay

## 2022-03-04 ENCOUNTER — Inpatient Hospital Stay (HOSPITAL_COMMUNITY): Payer: Medicare Other

## 2022-03-04 ENCOUNTER — Inpatient Hospital Stay (HOSPITAL_COMMUNITY): Payer: Medicare Other | Admitting: Anesthesiology

## 2022-03-04 ENCOUNTER — Encounter (HOSPITAL_COMMUNITY)
Admission: RE | Disposition: A | Discharge status: Home / Self Care | Payer: Self-pay | Source: Ambulatory Visit | Attending: Cardiology

## 2022-03-04 ENCOUNTER — Encounter (HOSPITAL_COMMUNITY): Admission: RE | Payer: Medicare Other | Source: Ambulatory Visit

## 2022-03-04 ENCOUNTER — Encounter (HOSPITAL_COMMUNITY): Payer: Self-pay | Admitting: Cardiology

## 2022-03-04 ENCOUNTER — Inpatient Hospital Stay (HOSPITAL_COMMUNITY)
Admission: RE | Admit: 2022-03-04 | Discharge: 2022-03-04 | DRG: 274 | Disposition: A | Discharge status: Home / Self Care | Payer: Medicare Other | Attending: Cardiology | Admitting: Cardiology

## 2022-03-04 DIAGNOSIS — Z8249 Family history of ischemic heart disease and other diseases of the circulatory system: Secondary | ICD-10-CM

## 2022-03-04 DIAGNOSIS — Z79890 Hormone replacement therapy (postmenopausal): Secondary | ICD-10-CM

## 2022-03-04 DIAGNOSIS — I10 Essential (primary) hypertension: Secondary | ICD-10-CM | POA: Diagnosis present

## 2022-03-04 DIAGNOSIS — I480 Paroxysmal atrial fibrillation: Secondary | ICD-10-CM

## 2022-03-04 DIAGNOSIS — Z79899 Other long term (current) drug therapy: Secondary | ICD-10-CM

## 2022-03-04 DIAGNOSIS — Z83719 Family history of colon polyps, unspecified: Secondary | ICD-10-CM

## 2022-03-04 DIAGNOSIS — Z86010 Personal history of colonic polyps: Secondary | ICD-10-CM

## 2022-03-04 DIAGNOSIS — Z7984 Long term (current) use of oral hypoglycemic drugs: Secondary | ICD-10-CM

## 2022-03-04 DIAGNOSIS — Z888 Allergy status to other drugs, medicaments and biological substances status: Secondary | ICD-10-CM | POA: Diagnosis not present

## 2022-03-04 DIAGNOSIS — E119 Type 2 diabetes mellitus without complications: Secondary | ICD-10-CM | POA: Diagnosis present

## 2022-03-04 DIAGNOSIS — Y9301 Activity, walking, marching and hiking: Secondary | ICD-10-CM | POA: Diagnosis present

## 2022-03-04 DIAGNOSIS — Z9621 Cochlear implant status: Secondary | ICD-10-CM | POA: Diagnosis present

## 2022-03-04 DIAGNOSIS — M1990 Unspecified osteoarthritis, unspecified site: Secondary | ICD-10-CM | POA: Diagnosis present

## 2022-03-04 DIAGNOSIS — W1830XA Fall on same level, unspecified, initial encounter: Secondary | ICD-10-CM | POA: Diagnosis present

## 2022-03-04 DIAGNOSIS — Z01818 Encounter for other preprocedural examination: Secondary | ICD-10-CM | POA: Diagnosis not present

## 2022-03-04 DIAGNOSIS — Z90710 Acquired absence of both cervix and uterus: Secondary | ICD-10-CM

## 2022-03-04 DIAGNOSIS — Z95818 Presence of other cardiac implants and grafts: Secondary | ICD-10-CM

## 2022-03-04 DIAGNOSIS — Q2112 Patent foramen ovale: Secondary | ICD-10-CM

## 2022-03-04 DIAGNOSIS — Z91148 Patient's other noncompliance with medication regimen for other reason: Secondary | ICD-10-CM

## 2022-03-04 DIAGNOSIS — Z803 Family history of malignant neoplasm of breast: Secondary | ICD-10-CM

## 2022-03-04 DIAGNOSIS — I358 Other nonrheumatic aortic valve disorders: Secondary | ICD-10-CM | POA: Diagnosis present

## 2022-03-04 DIAGNOSIS — Z006 Encounter for examination for normal comparison and control in clinical research program: Secondary | ICD-10-CM | POA: Diagnosis not present

## 2022-03-04 DIAGNOSIS — Z823 Family history of stroke: Secondary | ICD-10-CM | POA: Diagnosis not present

## 2022-03-04 DIAGNOSIS — Z833 Family history of diabetes mellitus: Secondary | ICD-10-CM

## 2022-03-04 DIAGNOSIS — Z881 Allergy status to other antibiotic agents status: Secondary | ICD-10-CM | POA: Diagnosis not present

## 2022-03-04 DIAGNOSIS — I351 Nonrheumatic aortic (valve) insufficiency: Secondary | ICD-10-CM | POA: Diagnosis present

## 2022-03-04 DIAGNOSIS — F32A Depression, unspecified: Secondary | ICD-10-CM | POA: Diagnosis present

## 2022-03-04 DIAGNOSIS — E039 Hypothyroidism, unspecified: Secondary | ICD-10-CM

## 2022-03-04 DIAGNOSIS — H9191 Unspecified hearing loss, right ear: Secondary | ICD-10-CM | POA: Diagnosis present

## 2022-03-04 DIAGNOSIS — Z7901 Long term (current) use of anticoagulants: Secondary | ICD-10-CM | POA: Diagnosis not present

## 2022-03-04 DIAGNOSIS — R296 Repeated falls: Secondary | ICD-10-CM | POA: Diagnosis present

## 2022-03-04 DIAGNOSIS — F418 Other specified anxiety disorders: Secondary | ICD-10-CM | POA: Diagnosis not present

## 2022-03-04 DIAGNOSIS — F419 Anxiety disorder, unspecified: Secondary | ICD-10-CM | POA: Diagnosis present

## 2022-03-04 HISTORY — DX: Presence of other cardiac implants and grafts: Z95.818

## 2022-03-04 HISTORY — PX: TEE WITHOUT CARDIOVERSION: SHX5443

## 2022-03-04 HISTORY — PX: LEFT ATRIAL APPENDAGE OCCLUSION: EP1229

## 2022-03-04 LAB — PROTIME-INR
INR: 2.2 — ABNORMAL HIGH (ref 0.8–1.2)
Prothrombin Time: 24.2 seconds — ABNORMAL HIGH (ref 11.4–15.2)

## 2022-03-04 LAB — ECHO TEE

## 2022-03-04 LAB — GLUCOSE, CAPILLARY
Glucose-Capillary: 122 mg/dL — ABNORMAL HIGH (ref 70–99)
Glucose-Capillary: 112 mg/dL — ABNORMAL HIGH (ref 70–99)
Glucose-Capillary: 172 mg/dL — ABNORMAL HIGH (ref 70–99)

## 2022-03-04 LAB — TYPE AND SCREEN
ABO/RH(D): A POS
Antibody Screen: NEGATIVE

## 2022-03-04 LAB — APTT: aPTT: 35 seconds (ref 24–36)

## 2022-03-04 LAB — SURGICAL PCR SCREEN
MRSA, PCR: NEGATIVE
Staphylococcus aureus: NEGATIVE

## 2022-03-04 LAB — POCT ACTIVATED CLOTTING TIME: Activated Clotting Time: 309 seconds

## 2022-03-04 LAB — ABO/RH: ABO/RH(D): A POS

## 2022-03-04 SURGERY — LEFT ATRIAL APPENDAGE OCCLUSION
Anesthesia: General

## 2022-03-04 MED ORDER — ONDANSETRON HCL 4 MG/2ML IJ SOLN: 4.0000 mg | Freq: Four times a day (QID) | INTRAMUSCULAR | Status: DC | PRN

## 2022-03-04 MED ORDER — SODIUM CHLORIDE 0.9 % IV SOLN: INTRAVENOUS | Status: DC

## 2022-03-04 MED ORDER — CHLORHEXIDINE GLUCONATE 4 % EX LIQD
Freq: Once | CUTANEOUS | Status: DC
  Filled 2022-03-04: qty 15

## 2022-03-04 MED ORDER — HEPARIN SODIUM (PORCINE) 1000 UNIT/ML IJ SOLN
INTRAMUSCULAR | Status: DC | PRN
  Administered 2022-03-04: 10000 [IU] via INTRAVENOUS

## 2022-03-04 MED ORDER — ONDANSETRON HCL 4 MG/2ML IJ SOLN
INTRAMUSCULAR | Status: DC | PRN
  Administered 2022-03-04: 4 mg via INTRAVENOUS

## 2022-03-04 MED ORDER — CEFAZOLIN SODIUM-DEXTROSE 2-4 GM/100ML-% IV SOLN
2.0000 g | INTRAVENOUS | Status: AC
  Administered 2022-03-04: 2 g via INTRAVENOUS
  Filled 2022-03-04: qty 100

## 2022-03-04 MED ORDER — HEPARIN (PORCINE) IN NACL 1000-0.9 UT/500ML-% IV SOLN
INTRAVENOUS | Status: DC | PRN
  Administered 2022-03-04 (×2): 500 mL

## 2022-03-04 MED ORDER — LIDOCAINE 2% (20 MG/ML) 5 ML SYRINGE
INTRAMUSCULAR | Status: DC | PRN
  Administered 2022-03-04: 60 mg via INTRAVENOUS

## 2022-03-04 MED ORDER — LACTATED RINGERS IV SOLN: INTRAVENOUS | Status: DC | PRN

## 2022-03-04 MED ORDER — HEPARIN (PORCINE) IN NACL 2000-0.9 UNIT/L-% IV SOLN
INTRAVENOUS | Status: DC | PRN
  Administered 2022-03-04: 1000 mL

## 2022-03-04 MED ORDER — IOHEXOL 350 MG/ML SOLN
INTRAVENOUS | Status: DC | PRN
  Administered 2022-03-04: 5 mL

## 2022-03-04 MED ORDER — SUGAMMADEX SODIUM 200 MG/2ML IV SOLN
INTRAVENOUS | Status: DC | PRN
  Administered 2022-03-04: 200 mg via INTRAVENOUS

## 2022-03-04 MED ORDER — SODIUM CHLORIDE 0.9% FLUSH: 3.0000 mL | Freq: Two times a day (BID) | INTRAVENOUS | Status: DC

## 2022-03-04 MED ORDER — ROCURONIUM BROMIDE 10 MG/ML (PF) SYRINGE
PREFILLED_SYRINGE | INTRAVENOUS | Status: DC | PRN
  Administered 2022-03-04: 20 mg via INTRAVENOUS
  Administered 2022-03-04: 40 mg via INTRAVENOUS

## 2022-03-04 MED ORDER — SODIUM CHLORIDE 0.9% FLUSH: 3.0000 mL | INTRAVENOUS | Status: DC | PRN

## 2022-03-04 MED ORDER — DEXAMETHASONE SODIUM PHOSPHATE 10 MG/ML IJ SOLN
INTRAMUSCULAR | Status: DC | PRN
  Administered 2022-03-04: 5 mg via INTRAVENOUS

## 2022-03-04 MED ORDER — FENTANYL CITRATE (PF) 100 MCG/2ML IJ SOLN
INTRAMUSCULAR | Status: AC
  Filled 2022-03-04: qty 2

## 2022-03-04 MED ORDER — FENTANYL CITRATE (PF) 250 MCG/5ML IJ SOLN
INTRAMUSCULAR | Status: DC | PRN
  Administered 2022-03-04: 50 ug via INTRAVENOUS

## 2022-03-04 MED ORDER — ACETAMINOPHEN 325 MG PO TABS
650.0000 mg | ORAL_TABLET | ORAL | Status: DC | PRN
  Administered 2022-03-04: 650 mg via ORAL
  Filled 2022-03-04: qty 2

## 2022-03-04 MED ORDER — CHLORHEXIDINE GLUCONATE 0.12 % MT SOLN
OROMUCOSAL | Status: AC
  Filled 2022-03-04: qty 15

## 2022-03-04 MED ORDER — PROTAMINE SULFATE 10 MG/ML IV SOLN
INTRAVENOUS | Status: DC | PRN
  Administered 2022-03-04: 30 mg via INTRAVENOUS

## 2022-03-04 MED ORDER — INSULIN ASPART 100 UNIT/ML IJ SOLN: 0.0000 [IU] | INTRAMUSCULAR | Status: DC | PRN

## 2022-03-04 MED ORDER — PHENYLEPHRINE HCL-NACL 20-0.9 MG/250ML-% IV SOLN
INTRAVENOUS | Status: DC | PRN
  Administered 2022-03-04: 20 ug/min via INTRAVENOUS

## 2022-03-04 MED ORDER — EPHEDRINE SULFATE (PRESSORS) 50 MG/ML IJ SOLN
INTRAMUSCULAR | Status: DC | PRN
  Administered 2022-03-04: 5 mg via INTRAVENOUS

## 2022-03-04 MED ORDER — PROPOFOL 10 MG/ML IV BOLUS
INTRAVENOUS | Status: DC | PRN
  Administered 2022-03-04: 20 mg via INTRAVENOUS
  Administered 2022-03-04: 100 mg via INTRAVENOUS
  Administered 2022-03-04: 20 mg via INTRAVENOUS

## 2022-03-04 MED ORDER — SODIUM CHLORIDE 0.9 % IV SOLN: INTRAVENOUS | Status: DC | PRN

## 2022-03-04 MED ORDER — ASPIRIN 81 MG PO TBEC: 81.0000 mg | DELAYED_RELEASE_TABLET | Freq: Every day | ORAL | 0 refills | Status: DC

## 2022-03-04 MED ORDER — SODIUM CHLORIDE 0.9 % IV SOLN: 250.0000 mL | INTRAVENOUS | Status: DC | PRN

## 2022-03-04 SURGICAL SUPPLY — 23 items
BLANKET WARM UNDERBOD FULL ACC (MISCELLANEOUS) ×1 IMPLANT
CATH INFINITI 5FR ANG PIGTAIL (CATHETERS) IMPLANT
CLOSURE PERCLOSE PROSTYLE (VASCULAR PRODUCTS) IMPLANT
DEVICE WATCHMAN FLX PROC (KITS) IMPLANT
DILATOR VESSEL 38 20CM 11FR (INTRODUCER) IMPLANT
GUIDEWIRE INQWIRE 1.5J.035X260 (WIRE) ×1 IMPLANT
INQWIRE 1.5J .035X260CM (WIRE) ×1
KIT HEART LEFT (KITS) ×1 IMPLANT
KIT SHEA VERSACROSS LAAC CONNE (KITS) IMPLANT
PACK CARDIAC CATHETERIZATION (CUSTOM PROCEDURE TRAY) ×1 IMPLANT
PAD DEFIB RADIO PHYSIO CONN (PAD) ×1 IMPLANT
PROTECTION STATION PRESSURIZED (MISCELLANEOUS) ×1
SHEATH PERFORMER 16FR 30 (SHEATH) IMPLANT
SHEATH PINNACLE 8F 10CM (SHEATH) IMPLANT
SHEATH PROBE COVER 6X72 (BAG) ×1 IMPLANT
STATION PROTECTION PRESSURIZED (MISCELLANEOUS) IMPLANT
SYS WATCHMAN FXD DBL (SHEATH) ×1
SYSTEM WATCHMAN FXD DBL (SHEATH) IMPLANT
TRANSDUCER W/STOPCOCK (MISCELLANEOUS) ×1 IMPLANT
TUBING CIL FLEX 10 FLL-RA (TUBING) IMPLANT
WATCHMAN FLX 31 (Prosthesis & Implant Heart) IMPLANT
WATCHMAN FLX PROCEDURE DEVICE (KITS) ×1 IMPLANT
WATCHMAN PROCED TRUSEAL ACCESS (SHEATH) IMPLANT

## 2022-03-04 NOTE — Transfer of Care (Signed)
Immediate Anesthesia Transfer of Care Note  Patient: Ashley Woodard  Procedure(s) Performed: LEFT ATRIAL APPENDAGE OCCLUSION TRANSESOPHAGEAL ECHOCARDIOGRAM (TEE)  Patient Location: PACU and Cath Lab  Anesthesia Type:General  Level of Consciousness: awake and drowsy  Airway & Oxygen Therapy: Patient Spontanous Breathing and Patient connected to nasal cannula oxygen  Post-op Assessment: Report given to RN and Post -op Vital signs reviewed and stable  Post vital signs: Reviewed and stable  Last Vitals:  Vitals Value Taken Time  BP 128/69 03/04/22 1329  Temp    Pulse 70 03/04/22 1332  Resp 18 03/04/22 1332  SpO2 96 % 03/04/22 1332  Vitals shown include unvalidated device data.  Last Pain:  Vitals:   03/04/22 1100  TempSrc:   PainSc: 0-No pain         Complications:  Encounter Notable Events  Notable Event Outcome Phase Comment  None  Intraprocedure

## 2022-03-04 NOTE — Discharge Instructions (Addendum)

## 2022-03-04 NOTE — H&P (Addendum)
I Electrophysiology Office Note:     Date:  03/04/2022    ID:  Ashley Woodard, DOB 19-Apr-1950, MRN [id number]   PCP:  Mliss Sax, MD            [facility] Cardiologist:  None  [facility] Electrophysiologist:  Lanier Prude, MD    Referring MD: Mliss Sax,*    Chief Complaint: Atrial fibrillation   History of Present Illness:     Ashley Woodard is a 72 y.o. female who presents for an evaluation of atrial fibrillation at the request of Dr. Ladona Woodard. Their medical history includes hypertension, diabetes, paroxysmal atrial fibrillation on Coumadin and frequent falls. We tried to get The patient last saw Dr. Ladona Woodard on October 13, 2021.  At that appointment her loop recorder was removed.   She had an atrial fibrillation ablation in March 2019.  During that ablation the veins were isolated.   Today she tells me that she has had a fall when walking on some stairs.  Unclear reason for the fall.  She also tells me she misses dose of blood thinners occasionally.  She has trouble remembering the doses.  Today she presents for LAAO.         Past Surgical History:  Procedure Laterality Date   ABDOMINAL HYSTERECTOMY   1988   ATRIAL FIBRILLATION ABLATION N/A 05/10/2017    Procedure: ATRIAL FIBRILLATION ABLATION;  Surgeon: Hillis Range, MD;  Location: [facility];  Service: Cardiovascular;  Laterality: N/A;   COCHLEAR IMPLANT Right 2008   COLONOSCOPY       LEFT HEART CATH AND CORONARY ANGIOGRAPHY N/A 08/20/2016    Procedure: Left Heart Cath and Coronary Angiography;  Surgeon: Yvonne Kendall, MD;  Location: [facility];  Service: Cardiovascular;  Laterality: N/A;   LOOP RECORDER INSERTION N/A 11/29/2017    Procedure: LOOP RECORDER INSERTION;  Surgeon: Hillis Range, MD;  Location: [facility];  Service: Cardiovascular;  Laterality: N/A;   ORIF ANKLE FRACTURE Right 02/06/2019    Procedure: OPEN REDUCTION INTERNAL FIXATION (ORIF) ANKLE FRACTURE LATERAL  MALLEOLUS, SYNDESMOSIS, RIGHT;  Surgeon: Terance Hart, MD;  Location: [facility];  Service: Orthopedics;  Laterality: Right;  SURGERY REQUEST TIME: 1.5 HOURS  CPT CODES: 73532, 99242, 68341, 27610      Current Medications: Active Medications      Current Meds  Medication Sig   amLODipine (NORVASC) 10 MG tablet Take 1 tablet (10 mg total) by mouth daily.   atorvastatin (LIPITOR) 10 MG tablet Take 1 tablet (10 mg total) by mouth daily.   Blood Glucose Calibration (ONETOUCH VERIO) SOLN 1 Act by In Vitro route in the morning and at bedtime.   Blood Glucose Monitoring Suppl (ONETOUCH VERIO FLEX SYSTEM) w/Device KIT 1 Act by Does not apply route in the morning and at bedtime.   diclofenac Sodium (VOLTAREN) 1 % GEL Apply a small grape sized dollop to tender to meet up to 4 times daily as needed   doxylamine, Sleep, (UNISOM) 25 MG tablet Take 25 mg by mouth at bedtime.   DULoxetine (CYMBALTA) 60 MG capsule Take 1 capsule (60 mg total) by mouth daily.   glucose blood (ONETOUCH VERIO) test strip 1 each by Other route in the morning and at bedtime. Use as instructed   hydrALAZINE (APRESOLINE) 50 MG tablet Take 1 tablet (50 mg total) by mouth 2 (two) times daily.   irbesartan (AVAPRO) 300 MG tablet TAKE 1 TABLET BY MOUTH  EVERY DAY   Lancets (ONETOUCH ULTRASOFT) lancets 1 each by Other route in the morning and at bedtime. Use as instructed   levothyroxine (SYNTHROID) 88 MCG tablet Take 1 tablet (88 mcg total) by mouth daily.   metFORMIN (GLUCOPHAGE) 500 MG tablet TAKE 1 TABLET BY MOUTH TWICE A DAY WITH MEALS   OnabotulinumtoxinA (BOTOX IJ) Inject as directed as needed.   torsemide (DEMADEX) 20 MG tablet TAKE 1 TABLET BY MOUTH EVERY DAY   warfarin (COUMADIN) 5 MG tablet Take 1 tablet (5 mg total) by mouth daily.        Allergies:   Amlodipine, Demeclocycline, Doxycycline, Erythromycin, Lisinopril, and Tetracyclines & related    Social History         Socioeconomic History    Marital status: Married      Spouse name: Not on file   Number of children: 2   Years of education: 12   Highest education level: Not on file  Occupational History   Occupation: Art therapist  Tobacco Use   Smoking status: Never   Smokeless tobacco: Never  Vaping Use   Vaping Use: Never used  Substance and Sexual Activity   Alcohol use: No   Drug use: No   Sexual activity: Not Currently  Other Topics Concern   Not on file  Social History Narrative    Lives at home with husband in [place].    Right-handed.    No caffeine use.    Manages a call center    Social Determinants of Health        Financial Resource Strain: Low Risk  (05/20/2021)    Overall Financial Resource Strain (CARDIA)     Difficulty of Paying Living Expenses: Not hard at all  Food Insecurity: No Food Insecurity (05/20/2021)    Hunger Vital Sign     Worried About Running Out of Food in the Last Year: Never true     Ran Out of Food in the Last Year: Never true  Transportation Needs: No Transportation Needs (05/20/2021)    PRAPARE - Therapist, art (Medical): No     Lack of Transportation (Non-Medical): No  Physical Activity: Inactive (05/20/2021)    Exercise Vital Sign     Days of Exercise per Week: 0 days     Minutes of Exercise per Session: 0 min  Stress: No Stress Concern Present (05/20/2021)    [employer] of Occupational Health - Occupational Stress Questionnaire     Feeling of Stress : Not at all  Social Connections: Moderately Integrated (05/20/2021)    Social Connection and Isolation Panel [NHANES]     Frequency of Communication with Friends and Family: Twice a week     Frequency of Social Gatherings with Friends and Family: Twice a week     Attends Religious Services: Never     Database administrator or Organizations: Yes     Attends Engineer, structural: 1 to 4 times per year     Marital Status: Married      Family History: The patient's family history  includes Breast cancer in her maternal aunt; Breast cancer (age of onset: 35) in her sister; Cancer in her brother and maternal uncle; Colon polyps in her mother; Dementia in her brother; Diabetes in her brother and mother; Heart attack in her brother, father, and mother; Hypertension in her brother, father, mother, and son; Prostate cancer in her brother; Stroke in her brother and father. There is no  history of Colon cancer, Esophageal cancer, Rectal cancer, or Stomach cancer.   ROS:   Please see the history of present illness.    All other systems reviewed and are negative.   EKGs/Labs/Other Studies Reviewed:     The following studies were reviewed today:         Recent Labs: 12/17/2020: ALT 16 03/27/2021: Hemoglobin 12.4; Platelets 371.0; TSH 1.29 06/19/2021: BUN 21; Creat 1.23; Potassium 3.5; Sodium 143  Recent Lipid Panel Labs (Brief)          Component Value Date/Time    CHOL 114 03/26/2020 1528    TRIG 154.0 (H) 03/26/2020 1528    HDL 41.50 03/26/2020 1528    CHOLHDL 3 03/26/2020 1528    VLDL 30.8 03/26/2020 1528    LDLCALC 41 03/26/2020 1528    LDLCALC 41 01/05/2019 1512    LDLDIRECT 44.0 03/27/2021 0812        Physical Exam:     VS:  BP 149/64   Pulse 66   Ht 5\' 7"  (1.702 m)   Wt 194 lb 6.4 oz (88.2 kg)   SpO2 94%   BMI 30.45 kg/m         Wt Readings from Last 3 Encounters:  10/16/21 194 lb 6.4 oz (88.2 kg)  10/13/21 195 lb 12.8 oz (88.8 kg)  06/19/21 189 lb 9.6 oz (86 kg)      GEN:  Well nourished, well developed in no acute distress HEENT: Normal NECK: No JVD; No carotid bruits LYMPHATICS: No lymphadenopathy CARDIAC: RRR, no murmurs, rubs, gallops RESPIRATORY:  Clear to auscultation without rales, wheezing or rhonchi  ABDOMEN: Soft, non-tender, non-distended MUSCULOSKELETAL:  No edema; No deformity  SKIN: Warm and dry NEUROLOGIC:  Alert and oriented x 3 PSYCHIATRIC:  Normal affect        Assessment ASSESSMENT:     1. Paroxysmal atrial  fibrillation (HCC)   2. Primary hypertension   3. Type 2 diabetes mellitus without complication, without long-term current use of insulin (HCC)     PLAN:     In order of problems listed above:   #Paroxysmal atrial fibrillation Overall very low burden after her ablation in 2019.  Has been maintained on Coumadin for stroke prophylaxis given an elevated CHA2DS2-VASc.  Given recent falls and forgetting doses of her medications, the patient is interested in pursuing left atrial appendage occlusion as a mechanism to reduce her stroke risk and avoid long-term exposure to anticoagulation.   --------------   I have seen Robie Ridge Minnie in the office today who is being considered for a Watchman left atrial appendage closure device. I believe they will benefit from this procedure given their history of atrial fibrillation, CHA2DS2-VASc score of 4 and unadjusted ischemic stroke rate of 4.8% per year. Unfortunately, the patient is not felt to be a long term anticoagulation candidate secondary to frequent falls and trouble adhering to her medication schedule. The patient's chart has been reviewed and I feel that they would be a candidate for short term oral anticoagulation after Watchman implant.    It is my belief that after undergoing a LAA closure procedure, DONIQUA SAXBY will not need long term anticoagulation which eliminates anticoagulation side effects and major bleeding risk.    Procedural risks for the Watchman implant have been reviewed with the patient including a 0.5% risk of stroke, <1% risk of perforation and <1% risk of device embolization. Other risks include bleeding, vascular damage, tamponade, worsening renal function, and death. The patient understands  these risk and wishes to proceed.       The published clinical data on the safety and effectiveness of WATCHMAN include but are not limited to the following: - Holmes DR, Mechele Claude, Sick P et al. for the PROTECT AF Investigators. Percutaneous  closure of the left atrial appendage versus warfarin therapy for prevention of stroke in patients with atrial fibrillation: a randomised non-inferiority trial. Lancet 2009; 374: 534-42. Mechele Claude, Doshi SK, Abelardo Diesel D et al. on behalf of the PROTECT AF Investigators. Percutaneous Left Atrial Appendage Closure for Stroke Prophylaxis in Patients With Atrial Fibrillation 2.3-Year Follow-up of the PROTECT AF (Watchman Left Atrial Appendage System for Embolic Protection in Patients With Atrial Fibrillation) Trial. Circulation 2013; 127:720-729. - Alli O, Doshi S,  Kar S, Reddy VY, Sievert H et al. Quality of Life Assessment in the Randomized PROTECT AF (Percutaneous Closure of the Left Atrial Appendage Versus Warfarin Therapy for Prevention of Stroke in Patients With Atrial Fibrillation) Trial of Patients at Risk for Stroke With Nonvalvular Atrial Fibrillation. J Am Coll Cardiol 2013; 12:8786-7. Vertell Limber DR, Tarri Abernethy, Price M, Crandall, Sievert H, Doshi S, Huber K, Reddy V. Prospective randomized evaluation of the Watchman left atrial appendage Device in patients with atrial fibrillation versus long-term warfarin therapy; the PREVAIL trial. Journal of the [employer] of Cardiology, Vol. 4, No. 1, 2014, 1-11. - Kar S, Doshi SK, Sadhu A, Horton R, Osorio J et al. Primary outcome evaluation of a next-generation left atrial appendage closure device: results from the PINNACLE FLX trial. Circulation 2021;143(18)1754-1762.      After today's visit with the patient which was dedicated solely for shared decision making visit regarding LAA closure device, the patient decided to proceed with the LAA appendage closure procedure scheduled to be [facility] in the [facility]. Prior to the procedure, I would like to obtain a gated CT scan of the chest with contrast timed for PV/LA visualization.  She will also need an echocardiogram prior to the procedure.     HAS-BLED score 2 Hypertension  Yes  Abnormal renal and liver function (Dialysis, transplant, Cr >2.26 mg/dL /Cirrhosis or Bilirubin >2x Normal or AST/ALT/AP >3x Normal) No  Stroke No  Bleeding No  Labile INR (Unstable/high INR) No  Elderly (>65) Yes  Drugs or alcohol (? 8 drinks/week, anti-plt or NSAID) No    CHA2DS2-VASc Score = 4  The patient's score is based upon: CHF History: 0 HTN History: 1 Diabetes History: 1 Stroke History: 0 Vascular Disease History: 0 Age Score: 1 Gender Score: 1      Presents for watchman today. Procedure reviewed including the risks and she wishes to proceed.     Signed, Hilton Cork. Quentin Ore, MD, [facility], [facility] 03/04/2022    Electrophysiology [facility]

## 2022-03-04 NOTE — Anesthesia Preprocedure Evaluation (Addendum)
Anesthesia Evaluation  Patient identified by MRN, date of birth, ID band Patient awake    Reviewed: Allergy & Precautions, NPO status , Patient's Chart, lab work & pertinent test results  History of Anesthesia Complications (+) PONV and history of anesthetic complications  Airway Mallampati: III  TM Distance: >3 FB Neck ROM: Full    Dental no notable dental hx.    Pulmonary neg pulmonary ROS   Pulmonary exam normal        Cardiovascular hypertension, Pt. on medications Normal cardiovascular exam+ dysrhythmias Atrial Fibrillation      Neuro/Psych  Headaches PSYCHIATRIC DISORDERS Anxiety Depression       GI/Hepatic negative GI ROS, Neg liver ROS,,,  Endo/Other  diabetesHypothyroidism    Renal/GU Renal disease     Musculoskeletal  (+) Arthritis ,    Abdominal   Peds  Hematology  (+) Blood dyscrasia (Warfarin)   Anesthesia Other Findings A-fib  Reproductive/Obstetrics                             Anesthesia Physical Anesthesia Plan  ASA: 3  Anesthesia Plan: General   Post-op Pain Management:    Induction: Intravenous  PONV Risk Score and Plan: 3 and Ondansetron, Dexamethasone, Midazolam and Treatment may vary due to age or medical condition  Airway Management Planned: Oral ETT  Additional Equipment: ClearSight and TEE  Intra-op Plan:   Post-operative Plan: Extubation in OR  Informed Consent: I have reviewed the patients History and Physical, chart, labs and discussed the procedure including the risks, benefits and alternatives for the proposed anesthesia with the patient or authorized representative who has indicated his/her understanding and acceptance.     Dental advisory given  Plan Discussed with: CRNA  Anesthesia Plan Comments: (TEE probe placement only. )        Anesthesia Quick Evaluation

## 2022-03-04 NOTE — Anesthesia Postprocedure Evaluation (Signed)
Anesthesia Post Note  Patient: Ashley Woodard  Procedure(s) Performed: LEFT ATRIAL APPENDAGE OCCLUSION TRANSESOPHAGEAL ECHOCARDIOGRAM (TEE)     Patient location during evaluation: PACU Anesthesia Type: General Level of consciousness: awake Pain management: pain level controlled Vital Signs Assessment: post-procedure vital signs reviewed and stable Respiratory status: spontaneous breathing, nonlabored ventilation and respiratory function stable Cardiovascular status: blood pressure returned to baseline and stable Postop Assessment: no apparent nausea or vomiting Anesthetic complications: no   Encounter Notable Events  Notable Event Outcome Phase Comment  None  Intraprocedure     Last Vitals:  Vitals:   03/04/22 1453 03/04/22 1617  BP: (!) 153/66 119/81  Pulse: 63 68  Resp: 16 18  Temp: (!) 36.3 C 36.8 C  SpO2: 96% 92%    Last Pain:  Vitals:   03/04/22 1617  TempSrc: Oral  PainSc:                  [name] P [name]

## 2022-03-04 NOTE — Discharge Summary (Addendum)
HEART AND VASCULAR CENTER   MULTIDISCIPLINARY HEART TEAM   DISCHARGE SUMMARY   Patient ID: Ashley Woodard,  MRN: [id number], DOB/AGE: 1951-02-01 72 y.o.  Admit date: 03/04/2022 Discharge date: 03/04/2022  Primary Care Physician: Libby Maw, MD  Primary Cardiologist: Cristopher Peru, MD  Electrophysiologist: Vickie Epley, MD  Primary Discharge Diagnosis:  Paroxysmal Atrial Fibrillation Poor candidacy for long term anticoagulation due to  frequent falls and poor medication compliance  Secondary Discharge Diagnosis:  HTN DMT2   Procedures This Admission:  Transeptal Puncture Intra-procedural TEE which showed no LAA thrombus Left atrial appendage occlusive device placement on 03/04/22 by Dr. Quentin Ore.  This study demonstrated: IMPRESSIONS   1. Interventional TEE for LAA-O. Prior to procedure, there was a patent  left atrial appendage. Maximal diamter 2.16 cm suitable for a 27 mm  Watchman FLX. Transeptal puncture was performed- a PFO was noted but this  was not crossed. A 27 mm Device was  deployed but with peri-device leak, sub optimal morphology, and lower  limit of normal compression. There was a proximal lobe seen in the 135  view that this device had difficulty excluding. Device was recaptured. a  31 mm Watchman FLX device was placed. No   peri-device leak. Average compression ~26%. Left to right shunting noted  post procedure; a trivial pericardial effusion remains unchanged through  case.. Left atrial size was mildly dilated. No left atrial/left atrial  appendage thrombus was detected.   2. Left ventricular ejection fraction, by estimation, is 60 to 65%. The  left ventricle has normal function.   3. Right ventricular systolic function is normal. The right ventricular  size is normal.   4. Right atrial size was mildly dilated.   5. The mitral valve is normal in structure. Trivial mitral valve  regurgitation. No evidence of mitral stenosis.   6. The aortic  valve is tricuspid. Aortic valve regurgitation is mild to  moderate. Aortic valve sclerosis is present, with no evidence of aortic  valve stenosis.   7. Evidence of atrial level shunting detected by color flow Doppler.      Brief HPI: Ashley Woodard is a 72 y.o. female with a history of paroxysmal atrial fibrillation on Coumadin s/p AF ablation 2019, HTN, and DM2. She has been followed by Dr. Lovena Le for her AF and was referred to Dr. Quentin Ore for the evaluation of [place] placement due to frequent falls and poor medication compliance.    She has undergone weekly INR checks leading up to procedure. INR 2.2 today.  The patient was seen as an outpatient and thought to be a poor candidate for continued anticoagulation due to  frequent falls  and medication non compliance. Risks, benefits, and alternatives to left atrial appendage occlusive device placement device were reviewed with the patient who wished to proceed.  The patient underwent intra-procedural TEE as above.   Hospital Course:  The patient was admitted and underwent Left Atrial appendage occlusive device placement with details as outlined above.  They were monitored on telemetry which demonstrated sinus.  Groin was without complication.  The patient was examined and considered to be stable for discharge.  Wound care and restrictions were reviewed with the patient. She has a coumadin clinic visit on 1/26.   Post Implant Anticoagulation Strategy: Continue Coumadin and add Aspirin 81mg  PO daily to complete 45 days of post implant therapy. After 45 days, stop Coumadin and add Plavix 75mg  by mouth once daily to complete 6 months of post implant therapy.  After 6 months, plan to stop Plavix. Plan for CT scan after 60 days to reassess Watchman position.    Physical Exam: Vitals:   03/04/22 1402 03/04/22 1415 03/04/22 1428 03/04/22 1453  BP:  (!) 164/69 (!) 159/62 (!) 153/66  Pulse:  68 63 63  Resp:  19 13 16   Temp: 98 F (36.7 C)   (!) 97.4 F  (36.3 C)  TempSrc: Temporal   Oral  SpO2:  93% 95% 96%  Weight:      Height:        GEN- The patient is well appearing, alert and oriented x 3 today.   HEENT: normocephalic, atraumatic; sclera clear, conjunctiva pink; hearing intact; oropharynx clear; neck supple  Lungs- Clear to ausculation bilaterally, normal work of breathing.  No wheezes, rales, rhonchi Heart- Regular rate and rhythm, no murmurs, rubs or gallops  GI- soft, non-tender, non-distended, bowel sounds present  Extremities- no clubbing, cyanosis, or edema; DP/PT/radial pulses 2+ bilaterally, groin without hematoma/bruit MS- no significant deformity or atrophy Skin- warm and dry, no rash or lesion Psych- euthymic mood, full affect Neuro- strength and sensation are intact   Labs:   Lab Results  Component Value Date   WBC 7.5 03/01/2022   HGB 11.7 03/01/2022   HCT 35.8 03/01/2022   MCV 87 03/01/2022   PLT 315 03/01/2022    Recent Labs  Lab 03/01/22 1047  NA 141  K 3.4*  CL 103  CO2 21  BUN 26  CREATININE 1.37*  CALCIUM 9.6  GLUCOSE 110*     Discharge Medications:  Allergies as of 03/04/2022       Reactions   Amlodipine Swelling   Ankle edema   Demeclocycline Rash   Doxycycline Rash   Erythromycin Rash   Lisinopril Cough   Tetracyclines & Related Rash        Medication List     TAKE these medications    amLODipine 10 MG tablet Commonly known as: NORVASC Take 1 tablet (10 mg total) by mouth daily.   aspirin EC 81 MG tablet Take 1 tablet (81 mg total) by mouth daily. Swallow whole.   atorvastatin 10 MG tablet Commonly known as: LIPITOR Take 1 tablet (10 mg total) by mouth daily.   BOTOX IJ Inject as directed as needed (spastic voic).   diclofenac Sodium 1 % Gel Commonly known as: Voltaren Apply a small grape sized dollop to tender to meet up to 4 times daily as needed   DULoxetine 60 MG capsule Commonly known as: Cymbalta Take 1 capsule (60 mg total) by mouth daily.    fluticasone 50 MCG/ACT nasal spray Commonly known as: FLONASE Place 1 spray into both nostrils daily. Begin by using 2 sprays in each nare daily for 3 to 5 days, then decrease to 1 spray in each nare daily.   hydrALAZINE 50 MG tablet Commonly known as: APRESOLINE TAKE 1 TABLET BY MOUTH TWICE A DAY   irbesartan 300 MG tablet Commonly known as: AVAPRO TAKE 1 TABLET BY MOUTH EVERY DAY   levothyroxine 88 MCG tablet Commonly known as: SYNTHROID Take 1 tablet (88 mcg total) by mouth daily.   metFORMIN 500 MG tablet Commonly known as: GLUCOPHAGE TAKE 1 TABLET BY MOUTH TWICE A DAY WITH FOOD   onetouch ultrasoft lancets 1 each by Other route in the morning and at bedtime. Use as instructed   OneTouch Verio Flex System w/Device Kit 1 Act by Does not apply route in the morning and at bedtime.  OneTouch Verio Soln 1 Act by In Vitro route in the morning and at bedtime.   OneTouch Verio test strip Generic drug: glucose blood 1 each by Other route in the morning and at bedtime. Use as instructed   Sleep-Aid 50 MG Caps Generic drug: diphenhydrAMINE HCl (Sleep) Take 50 mg by mouth at bedtime.   torsemide 20 MG tablet Commonly known as: DEMADEX TAKE 1 TABLET BY MOUTH EVERY DAY   warfarin 5 MG tablet Commonly known as: COUMADIN Take as directed. If you are unsure how to take this medication, talk to your nurse or doctor. Original instructions: TAKE 1 TABLET (5 MG TOTAL) BY MOUTH DAILY. What changed:  how much to take when to take this additional instructions        Disposition:  home same day   Follow-up Information     Tommie Raymond, NP. Go on 04/07/2022.   Specialty: Cardiology Why: @ 1:30pm, please arrive at least 10 minutes early. Contact information: [address] [place] [address] [phone]                 Duration of Discharge Encounter: Greater than 30 minutes including physician time.  Signed, Angelena Form, PA-C   03/04/2022 4:18 PM

## 2022-03-04 NOTE — Anesthesia Procedure Notes (Signed)
Procedure Name: Intubation Date/Time: 03/04/2022 12:12 PM  Performed by: Inda Coke, CRNAPre-anesthesia Checklist: Patient identified, Emergency Drugs available, Suction available, Timeout performed and Patient being monitored Patient Re-evaluated:Patient Re-evaluated prior to induction Oxygen Delivery Method: Circle system utilized Preoxygenation: Pre-oxygenation with 100% oxygen Induction Type: IV induction Ventilation: Mask ventilation without difficulty Laryngoscope Size: Mac and 3 Grade View: Grade I Tube type: Oral Tube size: 7.0 mm Number of attempts: 1 Airway Equipment and Method: Stylet Placement Confirmation: ETT inserted through vocal cords under direct vision, positive ETCO2, CO2 detector and breath sounds checked- equal and bilateral Secured at: 22 cm Tube secured with: Tape Dental Injury: Teeth and Oropharynx as per pre-operative assessment

## 2022-03-05 ENCOUNTER — Telehealth: Payer: Self-pay

## 2022-03-05 ENCOUNTER — Telehealth: Payer: Self-pay | Admitting: *Deleted

## 2022-03-05 ENCOUNTER — Telehealth: Payer: Self-pay | Admitting: Physician Assistant

## 2022-03-05 ENCOUNTER — Encounter (HOSPITAL_COMMUNITY): Payer: Self-pay | Admitting: Cardiology

## 2022-03-05 NOTE — Patient Outreach (Signed)
  Care Coordination TOC Note Transition Care Management Follow-up Telephone Call Date of discharge and from where: [facility] 03/04/22 How have you been since you were released from the hospital? "I am feeing a bit sore and have a headache, but other than that, doing well." Any questions or concerns? No  Items Reviewed: Did the pt receive and understand the discharge instructions provided? Yes  Medications obtained and verified? Yes  Other? No  Any new allergies since your discharge? No  Dietary orders reviewed? Yes Do you have support at home? Yes   Home Care and Equipment/Supplies: Were home health services ordered? no If so, what is the name of the agency? N/a  Has the agency set up a time to come to the patient's home? no Were any new equipment or medical supplies ordered?  No What is the name of the medical supply agency? N/a Were you able to get the supplies/equipment? not applicable Do you have any questions related to the use of the equipment or supplies? No  Functional Questionnaire: (I = Independent and D = Dependent) ADLs: I  Bathing/Dressing- I  Meal Prep- I  Eating- I  Maintaining continence- I  Transferring/Ambulation- I  Managing Meds- I  Follow up appointments reviewed:  PCP Hospital f/u appt confirmed? No   Specialist Hospital f/u appt confirmed? Yes  Scheduled to see Kathyrn Drown on 04/07/22 @ 1:30. Are transportation arrangements needed? No  If their condition worsens, is the pt aware to call PCP or go to the Emergency Dept.? Yes Was the patient provided with contact information for the PCP's office or ED? Yes Was to pt encouraged to call back with questions or concerns? Yes  SDOH assessments and interventions completed:   Yes SDOH Interventions Today    Flowsheet Row Most Recent Value  SDOH Interventions   Housing Interventions Intervention Not Indicated  Utilities Interventions Intervention Not Indicated       Care Coordination  Interventions:  PCP follow up appointment requested   Encounter Outcome:  Pt. Visit Completed

## 2022-03-05 NOTE — Progress Notes (Signed)
  Care Coordination  Note  03/05/2022 Name: SUMAIYAH MARKERT MRN: [id number] DOB: Jan 18, 1951  Robie Ridge Skirvin is a 72 y.o. year old primary care patient of Libby Maw, MD. I reached out to [employer] by phone today to assist with scheduling a follow up appointment. Ja Pistole Wilemon verbally consented to my assistance.       Follow up plan: Hospital Follow Up appointment scheduled with Ethelene Hal, Mortimer Fries, MD) on (03/08/22) at (11:00).  [place]  Direct Dial: [phone]

## 2022-03-05 NOTE — Telephone Encounter (Signed)
  [facility]   Watchman Team  Contacted the patient regarding discharge from [facility] on 01/03/23  The patient understands to follow up with Kathyrn Drown on 2/14 in preparation for imaging on 3/15.  The patient understands discharge instructions? Yes  The patient understands medications and regimen? Yes   The patient reports groin site looks good  The patient understands to call with any questions or concerns prior to scheduled visit.

## 2022-03-08 ENCOUNTER — Inpatient Hospital Stay: Payer: Medicare Other | Admitting: Family Medicine

## 2022-03-15 DIAGNOSIS — I10 Essential (primary) hypertension: Secondary | ICD-10-CM | POA: Diagnosis not present

## 2022-03-15 DIAGNOSIS — E1122 Type 2 diabetes mellitus with diabetic chronic kidney disease: Secondary | ICD-10-CM | POA: Diagnosis not present

## 2022-03-15 DIAGNOSIS — E785 Hyperlipidemia, unspecified: Secondary | ICD-10-CM | POA: Diagnosis not present

## 2022-03-15 DIAGNOSIS — E119 Type 2 diabetes mellitus without complications: Secondary | ICD-10-CM | POA: Diagnosis not present

## 2022-03-15 DIAGNOSIS — Z6830 Body mass index (BMI) 30.0-30.9, adult: Secondary | ICD-10-CM | POA: Diagnosis not present

## 2022-03-15 DIAGNOSIS — E782 Mixed hyperlipidemia: Secondary | ICD-10-CM | POA: Diagnosis not present

## 2022-03-18 ENCOUNTER — Ambulatory Visit (INDEPENDENT_AMBULATORY_CARE_PROVIDER_SITE_OTHER): Payer: Medicare Other | Admitting: Family Medicine

## 2022-03-18 ENCOUNTER — Encounter: Payer: Self-pay | Admitting: Family Medicine

## 2022-03-18 VITALS — BP 120/66 | HR 70 | Temp 97.7°F | Ht 67.0 in | Wt 178.8 lb

## 2022-03-18 DIAGNOSIS — E119 Type 2 diabetes mellitus without complications: Secondary | ICD-10-CM | POA: Diagnosis not present

## 2022-03-18 DIAGNOSIS — E039 Hypothyroidism, unspecified: Secondary | ICD-10-CM | POA: Diagnosis not present

## 2022-03-18 LAB — HEMOGLOBIN A1C: Hgb A1c MFr Bld: 6.4 % (ref 4.6–6.5)

## 2022-03-18 LAB — BASIC METABOLIC PANEL
BUN: 22 mg/dL (ref 6–23)
CO2: 26 mEq/L (ref 19–32)
Calcium: 9.9 mg/dL (ref 8.4–10.5)
Chloride: 105 mEq/L (ref 96–112)
Creatinine, Ser: 1.22 mg/dL — ABNORMAL HIGH (ref 0.40–1.20)
GFR: 44.85 mL/min — ABNORMAL LOW (ref >60.00)
Glucose, Bld: 112 mg/dL — ABNORMAL HIGH (ref 70–99)
Potassium: 3.7 mEq/L (ref 3.5–5.1)
Sodium: 142 mEq/L (ref 135–145)

## 2022-03-18 LAB — TSH: TSH: 0.23 u[IU]/mL — ABNORMAL LOW (ref 0.35–5.50)

## 2022-03-18 NOTE — Progress Notes (Addendum)
Established Patient Office Visit   Subjective:  Patient ID: Ashley Woodard, female    DOB: Aug 29, 1950  Age: 72 y.o. MRN: [id number]  Chief Complaint  Patient presents with   Hospitalization [facility] follow up patient had Watchman device placed, no concerns.     HPI Encounter Diagnoses  Name Primary?   Type 2 diabetes mellitus without complication, without long-term current use of insulin (HCC) Yes   Hypothyroidism, unspecified type    For hospital discharge follow-up status postplacement of Watchman device back on the 11th.  She has done well.  She denies feeling any chest pain shortness of breath or nausea.  Blood sugar has been slightly elevated in the hospital.  She has been exercising and losing weight.  Continues with metformin for diabetes.  Continues levothyroxine 88 mcg daily on a fasting stomach for hypothyroidism.   Review of Systems  Constitutional: Negative.   HENT: Negative.    Eyes:  Negative for blurred vision, discharge and redness.  Respiratory: Negative.    Cardiovascular: Negative.   Gastrointestinal:  Negative for abdominal pain.  Genitourinary: Negative.   Musculoskeletal: Negative.  Negative for myalgias.  Skin:  Negative for rash.  Neurological:  Negative for tingling, loss of consciousness and weakness.  Endo/Heme/Allergies:  Negative for polydipsia.     Current Outpatient Medications:    amLODipine (NORVASC) 10 MG tablet, Take 1 tablet (10 mg total) by mouth daily., Disp: 90 tablet, Rfl: 1   aspirin EC 81 MG tablet, Take 1 tablet (81 mg total) by mouth daily. Swallow whole., Disp: 45 tablet, Rfl: 0   atorvastatin (LIPITOR) 10 MG tablet, Take 1 tablet (10 mg total) by mouth daily., Disp: 90 tablet, Rfl: 3   diclofenac Sodium (VOLTAREN) 1 % GEL, Apply a small grape sized dollop to tender to meet up to 4 times daily as needed, Disp: 150 g, Rfl: 2   diphenhydrAMINE HCl, Sleep, (SLEEP-AID) 50 MG CAPS, Take 50 mg by mouth at bedtime., Disp: ,  Rfl:    DULoxetine (CYMBALTA) 60 MG capsule, Take 1 capsule (60 mg total) by mouth daily., Disp: 90 capsule, Rfl: 3   fluticasone (FLONASE) 50 MCG/ACT nasal spray, Place 1 spray into both nostrils daily. Begin by using 2 sprays in each nare daily for 3 to 5 days, then decrease to 1 spray in each nare daily., Disp: 31.6 mL, Rfl: 1   hydrALAZINE (APRESOLINE) 50 MG tablet, TAKE 1 TABLET BY MOUTH TWICE A DAY, Disp: 180 tablet, Rfl: 0   irbesartan (AVAPRO) 300 MG tablet, TAKE 1 TABLET BY MOUTH EVERY DAY, Disp: 90 tablet, Rfl: 0   levothyroxine (SYNTHROID) 88 MCG tablet, Take 1 tablet (88 mcg total) by mouth daily., Disp: 90 tablet, Rfl: 2   metFORMIN (GLUCOPHAGE) 500 MG tablet, TAKE 1 TABLET BY MOUTH TWICE A DAY WITH FOOD, Disp: 180 tablet, Rfl: 0   OnabotulinumtoxinA (BOTOX IJ), Inject as directed as needed (spastic voic)., Disp: , Rfl:    torsemide (DEMADEX) 20 MG tablet, TAKE 1 TABLET BY MOUTH EVERY DAY, Disp: 90 tablet, Rfl: 0   warfarin (COUMADIN) 5 MG tablet, TAKE 1 TABLET (5 MG TOTAL) BY MOUTH DAILY. (Patient taking differently: Take 2.5-5 mg by mouth See admin instructions. Take 2.5 mg mon., Wed., and Friday, all the other days take 5 mg), Disp: 90 tablet, Rfl: 1   Blood Glucose Calibration (ONETOUCH VERIO) SOLN, 1 Act by In Vitro route in the morning and at bedtime. (Patient not taking: Reported on  03/18/2022), Disp: 1 each, Rfl: 5   Blood Glucose Monitoring Suppl (ONETOUCH VERIO FLEX SYSTEM) w/Device KIT, 1 Act by Does not apply route in the morning and at bedtime. (Patient not taking: Reported on 03/18/2022), Disp: 1 kit, Rfl: 3   glucose blood (ONETOUCH VERIO) test strip, 1 each by Other route in the morning and at bedtime. Use as instructed (Patient not taking: Reported on 03/18/2022), Disp: 100 each, Rfl: 12   Lancets (ONETOUCH ULTRASOFT) lancets, 1 each by Other route in the morning and at bedtime. Use as instructed (Patient not taking: Reported on 03/18/2022), Disp: 100 each, Rfl: 5  Current  Facility-Administered Medications:    botulinum toxin Type A (BOTOX) injection 100 Units, 100 Units, Intramuscular, Once, Levert Feinstein, MD   Objective:     BP 120/66 (BP Location: Right Arm, Patient Position: Sitting, Cuff Size: Normal)   Pulse 70   Temp 97.7 F (36.5 C) (Temporal)   Ht 5\' 7"  (1.702 m)   Wt 178 lb 12.8 oz (81.1 kg)   SpO2 98%   BMI 28.00 kg/m  Wt Readings from Last 3 Encounters:  03/18/22 178 lb 12.8 oz (81.1 kg)  03/04/22 182 lb 15.7 oz (83 kg)  03/01/22 183 lb (83 kg)      Physical Exam Constitutional:      General: She is not in acute distress.    Appearance: Normal appearance. She is not ill-appearing, toxic-appearing or diaphoretic.  HENT:     Head: Normocephalic and atraumatic.     Right Ear: External ear normal.     Left Ear: External ear normal.     Mouth/Throat:     Mouth: Mucous membranes are moist.     Pharynx: Oropharynx is clear. No oropharyngeal exudate or posterior oropharyngeal erythema.  Eyes:     General: No scleral icterus.       Right eye: No discharge.        Left eye: No discharge.     Extraocular Movements: Extraocular movements intact.     Conjunctiva/sclera: Conjunctivae normal.     Pupils: Pupils are equal, round, and reactive to light.  Neck:     Vascular: No carotid bruit.  Cardiovascular:     Rate and Rhythm: Normal rate and regular rhythm.  Pulmonary:     Effort: Pulmonary effort is normal. No respiratory distress.     Breath sounds: Normal breath sounds.  Abdominal:     General: Bowel sounds are normal.  Musculoskeletal:     Cervical back: No rigidity or tenderness.     Right lower leg: No edema.     Left lower leg: No edema.  Lymphadenopathy:     Cervical: No cervical adenopathy.  Skin:    General: Skin is warm and dry.  Neurological:     Mental Status: She is alert and oriented to person, place, and time.  Psychiatric:        Mood and Affect: Mood normal.        Behavior: Behavior normal.   Okay   No  results found for any visits on 03/18/22.    The ASCVD Risk score (Arnett DK, et al., 2019) failed to calculate for the following reasons:   The valid total cholesterol range is 130 to 320 mg/dL    Assessment & Plan:   Type 2 diabetes mellitus without complication, without long-term current use of insulin (HCC) -     Basic metabolic panel -     Hemoglobin A1c  Hypothyroidism, unspecified type -  TSH    Return Follow-up in March for physical exam..  2/5 addendum: Patient confirms compliance with current dose.  Have decreased levothyroxine to 75 mcg daily 30 minutes before morning meal.  Libby Maw, MD

## 2022-03-19 ENCOUNTER — Ambulatory Visit: Payer: Medicare Other | Attending: Cardiovascular Disease

## 2022-03-19 DIAGNOSIS — I4891 Unspecified atrial fibrillation: Secondary | ICD-10-CM | POA: Diagnosis not present

## 2022-03-19 DIAGNOSIS — Z5181 Encounter for therapeutic drug level monitoring: Secondary | ICD-10-CM | POA: Diagnosis not present

## 2022-03-19 LAB — POCT INR: INR: 2.3 (ref 2.0–3.0)

## 2022-03-19 NOTE — Patient Instructions (Signed)
*  Watchman scheduled for 03/04/2022 *  45 days D/C Coumadin   continue taking Warfarin 1 tablet daily except for 1/2 tablet on Mondays, Wednesdays and Fridays.  Recheck INR in 5 weeks.  [facility] [phone]

## 2022-03-23 ENCOUNTER — Encounter (HOSPITAL_COMMUNITY): Payer: Self-pay | Admitting: Cardiology

## 2022-03-23 DIAGNOSIS — Z6830 Body mass index (BMI) 30.0-30.9, adult: Secondary | ICD-10-CM | POA: Diagnosis not present

## 2022-03-23 DIAGNOSIS — Z6826 Body mass index (BMI) 26.0-26.9, adult: Secondary | ICD-10-CM | POA: Diagnosis not present

## 2022-03-23 DIAGNOSIS — I10 Essential (primary) hypertension: Secondary | ICD-10-CM | POA: Diagnosis not present

## 2022-03-23 DIAGNOSIS — E119 Type 2 diabetes mellitus without complications: Secondary | ICD-10-CM | POA: Diagnosis not present

## 2022-03-23 DIAGNOSIS — N189 Chronic kidney disease, unspecified: Secondary | ICD-10-CM | POA: Diagnosis not present

## 2022-03-23 DIAGNOSIS — E782 Mixed hyperlipidemia: Secondary | ICD-10-CM | POA: Diagnosis not present

## 2022-03-23 DIAGNOSIS — E669 Obesity, unspecified: Secondary | ICD-10-CM | POA: Diagnosis not present

## 2022-03-23 DIAGNOSIS — E1122 Type 2 diabetes mellitus with diabetic chronic kidney disease: Secondary | ICD-10-CM | POA: Diagnosis not present

## 2022-03-25 DIAGNOSIS — E785 Hyperlipidemia, unspecified: Secondary | ICD-10-CM | POA: Diagnosis not present

## 2022-03-25 DIAGNOSIS — E119 Type 2 diabetes mellitus without complications: Secondary | ICD-10-CM | POA: Diagnosis not present

## 2022-03-26 ENCOUNTER — Telehealth: Payer: Self-pay

## 2022-03-26 DIAGNOSIS — E039 Hypothyroidism, unspecified: Secondary | ICD-10-CM

## 2022-03-26 NOTE — Telephone Encounter (Signed)
Called patient went over labs and recommendations per patient she is currently taking thyroid medication as directed on fasting stomach 30 mins prior to breakfast. Patient would like to know if she should be doing something else? Please advise.

## 2022-03-29 MED ORDER — LEVOTHYROXINE SODIUM 75 MCG PO TABS: 75.0000 ug | ORAL_TABLET | Freq: Every day | ORAL | 0 refills | Status: DC

## 2022-03-31 NOTE — Telephone Encounter (Signed)
Patient aware thyroid medication lowered new Rx sent to pharmacy. Patient will pick up and start. No questions at this time.

## 2022-04-03 NOTE — Progress Notes (Unsigned)
[facility]                                     Cardiology Office Note:    Date:  04/07/2022   ID:  Ashley Woodard, DOB 07/23/1950, MRN [id number]  PCP:  Libby Maw, MD  [facility] Cardiologist:  Cristopher Peru, MD  [facility] Electrophysiologist:  Vickie Epley, MD   Referring MD: Libby Maw,*   Chief Complaint  Patient presents with   Follow-up    Post LAAO     History of Present Illness:    Ashley Woodard is a 72 y.o. female with a hx of paroxysmal atrial fibrillation on Coumadin s/p AF ablation 2019, HTN, and DM2. She has been followed by Dr. Lovena Le for her AF and was referred to Dr. Quentin Ore for the evaluation of [place] placement due to frequent falls and poor medication compliance.     Ashley Woodard was seen as an outpatient and thought to be a poor candidate for long term anticoagulation and underwent LAAO closure with a 62m Watchman device. She was restarted on Coumadin and ASA was added with plans to transition to DAPT with ASA and Plavix at the 45 days mark and continue through 6 months, then ASA monotherapy. Plan for CT scan after 60 days to reassess Watchman position.  She is here today and reports that she has been very well [place] with no chest pain, palpitations, LE edema, SOB, dizziness, bleeding, or syncope.   Past Medical History:  Diagnosis Date   Abnormal cardiovascular stress test 08/21/2016   a. done for abnl EKG/cardiac risk factors -> admitted for cath 07/2016 which showed no significant CAD, normal LV contraction, mildly elevated filling pressure. Low dose Imdur was added for possible component of microvascular dysfunction.    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 LQuentin Ore  S/P cardiac catheterization, 08/20/16 minimal CAD 08/21/2016   Type II diabetes mellitus ([facility]    Voice tremor     Past Surgical History:  Procedure Laterality Date   ABDOMINAL HYSTERECTOMY  1988   ATRIAL FIBRILLATION ABLATION N/A 05/10/2017   Procedure: ATRIAL FIBRILLATION ABLATION;  Surgeon: AThompson Grayer MD;  [facility];  Service: Cardiovascular;  Laterality: N/A;   COCHLEAR IMPLANT Right 2008   COLONOSCOPY     LEFT ATRIAL APPENDAGE OCCLUSION N/A 03/04/2022   Procedure: LEFT ATRIAL APPENDAGE OCCLUSION;  Surgeon: LVickie Epley MD;  [place] [facility];  Service: Cardiovascular;  Laterality: N/A;   LEFT HEART CATH AND CORONARY ANGIOGRAPHY N/A 08/20/2016   Procedure: Left Heart Cath and Coronary Angiography;  Surgeon: ENelva Bush MD;  [facility];  Service:  Cardiovascular;  Laterality: N/A;   LOOP RECORDER INSERTION N/A 11/29/2017   Procedure: LOOP RECORDER INSERTION;  Surgeon: Thompson Grayer, MD;  Location: [facility];  Service: Cardiovascular;  Laterality: N/A;   ORIF ANKLE FRACTURE Right 02/06/2019   Procedure: OPEN REDUCTION INTERNAL FIXATION (ORIF) ANKLE FRACTURE LATERAL MALLEOLUS, SYNDESMOSIS, RIGHT;  Surgeon: Erle Crocker, MD;  Location: [facility];  Service: Orthopedics;  Laterality: Right;  SURGERY REQUEST TIME: 1.5 HOURS  CPT CODES: 24401, [id number], [id number], [id number]   TEE WITHOUT CARDIOVERSION N/A 03/04/2022   Procedure: TRANSESOPHAGEAL ECHOCARDIOGRAM (TEE);  Surgeon: Vickie Epley, MD;  Location: [facility];  Service: Cardiovascular;  Laterality: N/A;    Current  Medications: Current Meds  Medication Sig   amLODipine (NORVASC) 10 MG tablet Take 1 tablet (10 mg total) by mouth daily.   aspirin EC 81 MG tablet Take 1 tablet (81 mg total) by mouth daily. Swallow whole.   atorvastatin (LIPITOR) 10 MG tablet Take 1 tablet (10 mg total) by mouth daily.   Blood Glucose Calibration (ONETOUCH VERIO) SOLN 1 Act by In Vitro route in the morning and at bedtime.   Blood Glucose Monitoring Suppl (ONETOUCH VERIO FLEX SYSTEM) w/Device KIT 1 Act by Does not apply route in the morning and at bedtime.   clopidogrel (PLAVIX) 75 MG tablet Take 1 tablet (75 mg total) by mouth daily. START ON 2/26 AFTER STOPPING COUMADIN ON 2/25   diclofenac Sodium (VOLTAREN) 1 % GEL Apply a small grape sized dollop to tender to meet up to 4 times daily as needed   diphenhydrAMINE HCl, Sleep, (SLEEP-AID) 50 MG CAPS Take 50 mg by mouth at bedtime.   DULoxetine (CYMBALTA) 60 MG capsule Take 1 capsule (60 mg total) by mouth daily.   fluticasone (FLONASE) 50 MCG/ACT nasal spray Place 1 spray into both nostrils daily. Begin by using 2 sprays in each nare daily for 3 to 5 days, then decrease to 1 spray in each nare daily.   glucose blood (ONETOUCH VERIO) test strip 1 each by Other route in the morning and at bedtime. Use as instructed   hydrALAZINE (APRESOLINE) 50 MG tablet TAKE 1 TABLET BY MOUTH TWICE A DAY   irbesartan (AVAPRO) 300 MG tablet TAKE 1 TABLET BY MOUTH EVERY DAY   Lancets (ONETOUCH ULTRASOFT) lancets 1 each by Other route in the morning and at bedtime. Use as instructed   levothyroxine (SYNTHROID) 75 MCG tablet Take 1 tablet (75 mcg total) by mouth daily.   metFORMIN (GLUCOPHAGE) 500 MG tablet TAKE 1 TABLET BY MOUTH TWICE A DAY WITH FOOD   OnabotulinumtoxinA (BOTOX IJ) Inject as directed as needed (spastic voic).   torsemide (DEMADEX) 20 MG tablet TAKE 1 TABLET BY MOUTH EVERY DAY   warfarin (COUMADIN) 5 MG tablet TAKE 1 TABLET (5 MG TOTAL) BY MOUTH DAILY. (Patient taking differently:  Take 2.5-5 mg by mouth See admin instructions. Take 2.5 mg mon., Wed., and Friday, all the other days take 5 mg)   Current Facility-Administered Medications for the 04/07/22 encounter (Office Visit) with [facility]  Medication   botulinum toxin Type A (BOTOX) injection 100 Units     Allergies:   Amlodipine, Demeclocycline, Doxycycline, Erythromycin, Lisinopril, and Tetracyclines & related   Social History   Socioeconomic History   Marital status: Married    Spouse name: Not on file   Number of children: 2   Years of education: 12   Highest education level: Not on file  Occupational History   Occupation: Health and safety inspector  Tobacco Use   Smoking status: Never   Smokeless tobacco: Never  Vaping Use   Vaping Use: Never used  Substance and Sexual Activity   Alcohol use: No   Drug use: No   Sexual activity: Not Currently  Other Topics Concern   Not on file  Social History Narrative   Lives at home with husband in [place].   Right-handed.   No caffeine use.   Manages a call center   Social Determinants of Health   Financial Resource Strain: Low Risk  (05/20/2021)   Overall Financial Resource Strain (CARDIA)    Difficulty of Paying Living Expenses: Not hard at all  Food Insecurity: No Food Insecurity (05/20/2021)   Hunger Vital Sign    Worried About Running Out of Food in the Last Year: Never true    Ran Out of Food in the Last Year: Never true  Transportation Needs: No Transportation Needs (05/20/2021)   PRAPARE - Hydrologist (Medical): No    Lack of Transportation (Non-Medical): No  Physical Activity: Inactive (05/20/2021)   Exercise Vital Sign    Days of Exercise per Week: 0 days    Minutes of Exercise per Session: 0 min  Stress: No Stress Concern Present (05/20/2021)   [place]    Feeling of Stress : Not at all  Social Connections: Moderately Integrated  (05/20/2021)   Social Connection and Isolation Panel [NHANES]    Frequency of Communication with Friends and Family: Twice a week    Frequency of Social Gatherings with Friends and Family: Twice a week    Attends Religious Services: Never    Marine scientist or Organizations: Yes    Attends Music therapist: 1 to 4 times per year    Marital Status: Married    Family History: The patient's family history includes Breast cancer in her maternal aunt and maternal aunt; Breast cancer (age of onset: 3) in her sister; Cancer in her brother and maternal uncle; Colon polyps in her mother; Dementia in her brother; Diabetes in her brother and mother; Heart attack in her brother, father, and mother; Hypertension in her brother, father, mother, and son; Prostate cancer in her brother; Stroke in her brother and father. There is no history of Colon cancer, Esophageal cancer, Rectal cancer, or Stomach cancer.  ROS:   Please see the history of present illness.    All other systems reviewed and are negative.  EKGs/Labs/Other Studies Reviewed:    The following studies were reviewed today:  Procedures This Admission:  Transeptal Puncture Intra-procedural TEE which showed no LAA thrombus Left atrial appendage occlusive device placement on 03/04/22 by Dr. Quentin Ore.  This study demonstrated: IMPRESSIONS   1. Interventional TEE for LAA-O. Prior to procedure, there was a patent  left atrial appendage. Maximal diamter 2.16 cm suitable for a 27 mm  Watchman FLX. Transeptal puncture was performed- a PFO was noted but this  was not crossed. A 27 mm Device was  deployed but with peri-device leak, sub optimal morphology, and lower  limit of normal compression. There was a proximal lobe seen in the 135  view that this device had difficulty excluding. Device was recaptured. a  31 mm Watchman FLX device was placed. No   peri-device leak. Average compression ~26%. Left to right shunting noted  post  procedure; a trivial pericardial effusion remains unchanged through  case.Marland Kitchen  Left atrial size was mildly dilated. No left atrial/left atrial  appendage thrombus was detected.   2. Left ventricular ejection fraction, by estimation, is 60 to 65%. The  left ventricle has normal function.   3. Right ventricular systolic function is normal. The right ventricular  size is normal.   4. Right atrial size was mildly dilated.   5. The mitral valve is normal in structure. Trivial mitral valve  regurgitation. No evidence of mitral stenosis.   6. The aortic valve is tricuspid. Aortic valve regurgitation is mild to  moderate. Aortic valve sclerosis is present, with no evidence of aortic  valve stenosis.   7. Evidence of atrial level shunting detected by color flow Doppler.   EKG:  EKG is not ordered today.    Recent Labs: 03/01/2022: Hemoglobin 11.7; Platelets 315 03/18/2022: BUN 22; Creatinine, Ser 1.22; Potassium 3.7; Sodium 142; TSH 0.23   Recent Lipid Panel    Component Value Date/Time   CHOL 113 10/23/2021 1024   TRIG 175.0 (H) 10/23/2021 1024   HDL 41.50 10/23/2021 1024   CHOLHDL 3 10/23/2021 1024   VLDL 35.0 10/23/2021 1024   LDLCALC 36 10/23/2021 1024   LDLCALC 41 01/05/2019 1512   LDLDIRECT 44.0 03/27/2021 0812   Physical Exam:    VS:  BP 110/68   Pulse 67   Ht 5' 7"$  (1.702 m)   Wt 178 lb (80.7 kg)   SpO2 97%   BMI 27.88 kg/m     Wt Readings from Last 3 Encounters:  04/07/22 178 lb (80.7 kg)  03/18/22 178 lb 12.8 oz (81.1 kg)  03/04/22 182 lb 15.7 oz (83 kg)    General: Well developed, well nourished, NAD Neck: Negative for carotid bruits. No JVD Lungs:Clear to ausculation bilaterally. No wheezes, rales, or rhonchi. Breathing is unlabored. Cardiovascular: RRR with S1 S2. No murmurs Extremities: No edema.  Neuro: Alert and oriented. No focal deficits. No facial asymmetry. MAE spontaneously. Psych: Responds to questions appropriately with normal affect.     ASSESSMENT/PLAN:    PAF: s/p LAAO closure with Watchman. She will continue ASA and Coumadin until 04/18/22, at that time she will stop Coumadin and start Plavix 67m QD on 04/19/22 through 6 months. She will then stop Plavix and continue ASA monotherapy indefinitely. Plan for post procedure CT 3/15. Ct instructions reviewed. Obtain BMET today. I will contact [facility] to inform them that she will no longer need INR checks. Plan 6 month follow up. She will need dental SBE through 6 months with Amoxicillin.    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 She is asymptomatic at this time.    HTN: Stable with no changes today  Medication Adjustments/Labs and Tests Ordered: Current medicines are reviewed at length with the patient today.  Concerns regarding medicines are outlined above.  Orders Placed This Encounter  Procedures   Basic metabolic panel   Meds ordered this encounter  Medications   clopidogrel (PLAVIX) 75 MG tablet    Sig: Take 1 tablet (75 mg total) by mouth daily. START ON 2/26 AFTER STOPPING COUMADIN ON 2/25    Dispense:  90 tablet    Refill:  3    Patient Instructions  Medication Instructions:  Your physician has recommended you make the following change in your medication:  1.STOP COUMADIN ON 2/25 2. START PLAVIX 75 MG ON 2/26 3. CONTINUE ASPIRIN    *If you need a refill on your cardiac medications before your next appointment, please call  your pharmacy*   Lab Work: TODAY: BMET If you have labs (blood work) drawn today and your tests are completely normal, you will receive your results only by: [place] (if you have MyChart) OR A paper copy in the mail If you have any lab test that is abnormal or we need to change your treatment, we will call you to review the results.   Testing/Procedures: SEE CT INSTRUCTION LETTER   Follow-Up: At [facility], you and your health needs are our priority.  As part of our continuing  mission to provide you with exceptional heart care, we have created designated Provider Care Teams.  These Care Teams include your primary Cardiologist (physician) and Advanced Practice Providers (APPs -  Physician Assistants and Nurse Practitioners) who all work together to provide you with the care you need, when you need it.  We recommend signing up for the patient portal called "MyChart".  Sign up information is provided on this After Visit Summary.  MyChart is used to connect with patients for Virtual Visits (Telemedicine).  Patients are able to view lab/test results, encounter notes, upcoming appointments, etc.  Non-urgent messages can be sent to your provider as well.   To learn more about what you can do with MyChart, go to [email or web address].    Your next appointment:   YOU WILL BE CALL TO DISCUSS DATE FOR A FOLLOW-UP APPOINTMENT    Signed, Kathyrn Drown, NP  04/07/2022 3:30 PM    [facility]

## 2022-04-07 ENCOUNTER — Ambulatory Visit: Payer: Medicare Other | Attending: Cardiology | Admitting: Cardiology

## 2022-04-07 VITALS — BP 110/68 | HR 67 | Ht 67.0 in | Wt 178.0 lb

## 2022-04-07 DIAGNOSIS — Z95818 Presence of other cardiac implants and grafts: Secondary | ICD-10-CM | POA: Diagnosis not present

## 2022-04-07 DIAGNOSIS — I4891 Unspecified atrial fibrillation: Secondary | ICD-10-CM | POA: Diagnosis not present

## 2022-04-07 DIAGNOSIS — I480 Paroxysmal atrial fibrillation: Secondary | ICD-10-CM | POA: Diagnosis not present

## 2022-04-07 MED ORDER — CLOPIDOGREL BISULFATE 75 MG PO TABS: 75.0000 mg | ORAL_TABLET | Freq: Every day | ORAL | 3 refills | Status: DC

## 2022-04-07 NOTE — Patient Instructions (Addendum)
Medication Instructions:  Your physician has recommended you make the following change in your medication:  1.STOP COUMADIN ON 2/25 2. START PLAVIX 75 MG ON 2/26 3. CONTINUE ASPIRIN    *If you need a refill on your cardiac medications before your next appointment, please call your pharmacy*   Lab Work: TODAY: BMET If you have labs (blood work) drawn today and your tests are completely normal, you will receive your results only by: [place] (if you have MyChart) OR A paper copy in the mail If you have any lab test that is abnormal or we need to change your treatment, we will call you to review the results.   Testing/Procedures: SEE CT INSTRUCTION LETTER   Follow-Up: At [facility], you and your health needs are our priority.  As part of our continuing mission to provide you with exceptional heart care, we have created designated Provider Care Teams.  These Care Teams include your primary Cardiologist (physician) and Advanced Practice Providers (APPs -  Physician Assistants and Nurse Practitioners) who all work together to provide you with the care you need, when you need it.  We recommend signing up for the patient portal called "MyChart".  Sign up information is provided on this After Visit Summary.  MyChart is used to connect with patients for Virtual Visits (Telemedicine).  Patients are able to view lab/test results, encounter notes, upcoming appointments, etc.  Non-urgent messages can be sent to your provider as well.   To learn more about what you can do with MyChart, go to [email or web address].    Your next appointment:   YOU WILL BE CALL TO DISCUSS DATE FOR A FOLLOW-UP APPOINTMENT

## 2022-04-08 ENCOUNTER — Other Ambulatory Visit: Payer: Self-pay | Admitting: Family Medicine

## 2022-04-08 DIAGNOSIS — E039 Hypothyroidism, unspecified: Secondary | ICD-10-CM

## 2022-04-08 DIAGNOSIS — I10 Essential (primary) hypertension: Secondary | ICD-10-CM

## 2022-04-08 LAB — BASIC METABOLIC PANEL
BUN/Creatinine Ratio: 22 (ref 12–28)
BUN: 26 mg/dL (ref 8–27)
CO2: 21 mmol/L (ref 20–29)
Calcium: 9.6 mg/dL (ref 8.7–10.3)
Chloride: 102 mmol/L (ref 96–106)
Creatinine, Ser: 1.19 mg/dL — ABNORMAL HIGH (ref 0.57–1.00)
Glucose: 100 mg/dL — ABNORMAL HIGH (ref 70–99)
Potassium: 4.3 mmol/L (ref 3.5–5.2)
Sodium: 139 mmol/L (ref 134–144)
eGFR: 49 mL/min/{1.73_m2} — ABNORMAL LOW (ref >59)

## 2022-04-11 ENCOUNTER — Other Ambulatory Visit: Payer: Self-pay | Admitting: Physician Assistant

## 2022-04-12 ENCOUNTER — Encounter: Payer: Self-pay | Admitting: Family Medicine

## 2022-04-14 DIAGNOSIS — E119 Type 2 diabetes mellitus without complications: Secondary | ICD-10-CM | POA: Diagnosis not present

## 2022-04-14 DIAGNOSIS — E785 Hyperlipidemia, unspecified: Secondary | ICD-10-CM | POA: Diagnosis not present

## 2022-04-15 DIAGNOSIS — E782 Mixed hyperlipidemia: Secondary | ICD-10-CM | POA: Diagnosis not present

## 2022-04-15 DIAGNOSIS — Z6826 Body mass index (BMI) 26.0-26.9, adult: Secondary | ICD-10-CM | POA: Diagnosis not present

## 2022-04-15 DIAGNOSIS — E119 Type 2 diabetes mellitus without complications: Secondary | ICD-10-CM | POA: Diagnosis not present

## 2022-04-15 DIAGNOSIS — I10 Essential (primary) hypertension: Secondary | ICD-10-CM | POA: Diagnosis not present

## 2022-04-15 DIAGNOSIS — N189 Chronic kidney disease, unspecified: Secondary | ICD-10-CM | POA: Diagnosis not present

## 2022-04-17 ENCOUNTER — Other Ambulatory Visit: Payer: Self-pay

## 2022-04-17 ENCOUNTER — Other Ambulatory Visit: Payer: Self-pay | Admitting: Family Medicine

## 2022-04-17 ENCOUNTER — Ambulatory Visit
Admission: RE | Admit: 2022-04-17 | Discharge: 2022-04-17 | Disposition: A | Discharge status: Home / Self Care | Payer: Medicare Other | Source: Ambulatory Visit

## 2022-04-17 VITALS — BP 145/76 | HR 76 | Temp 98.2°F | Resp 17

## 2022-04-17 DIAGNOSIS — E119 Type 2 diabetes mellitus without complications: Secondary | ICD-10-CM

## 2022-04-17 DIAGNOSIS — B9789 Other viral agents as the cause of diseases classified elsewhere: Secondary | ICD-10-CM | POA: Diagnosis not present

## 2022-04-17 DIAGNOSIS — R0982 Postnasal drip: Secondary | ICD-10-CM | POA: Diagnosis not present

## 2022-04-17 DIAGNOSIS — I10 Essential (primary) hypertension: Secondary | ICD-10-CM

## 2022-04-17 DIAGNOSIS — R0981 Nasal congestion: Secondary | ICD-10-CM

## 2022-04-17 DIAGNOSIS — J019 Acute sinusitis, unspecified: Secondary | ICD-10-CM

## 2022-04-17 MED ORDER — AZELASTINE HCL 137 MCG/SPRAY NA SOLN: 1.0000 | Freq: Two times a day (BID) | NASAL | 0 refills | Status: DC

## 2022-04-17 MED ORDER — PREDNISONE 10 MG (21) PO TBPK: ORAL_TABLET | Freq: Every day | ORAL | 0 refills | Status: DC

## 2022-04-17 MED ORDER — DEXAMETHASONE SODIUM PHOSPHATE 10 MG/ML IJ SOLN
10.0000 mg | Freq: Once | INTRAMUSCULAR | Status: AC
  Administered 2022-04-17: 10 mg via INTRAMUSCULAR

## 2022-04-17 NOTE — ED Triage Notes (Signed)
Pt c/o cough and congestion/runny nose x 8 days. Denies fever. Hx of sinus infections. E visit on Wed am, tx with Amoxicillin, also taking leftover rx of promethazine with no relief.

## 2022-04-17 NOTE — Discharge Instructions (Addendum)
You were given a steroid injection in our office. I discharged you home on a prednisone taper pack. Do not start the oral pills until tomorrow, 04/18/2022. Please also use the nasal spray called in today.  Please switch from a Nettie pot to the NeilMed sinus rinse or the sinugator.  You can purchase this over-the-counter at any pharmacy.

## 2022-04-17 NOTE — ED Provider Notes (Signed)
[facility]    CSN: [id number] Arrival date & time: 04/17/22  1235      History   Chief Complaint Chief Complaint  Patient presents with   Cough    APPT 1245PM   Nasal Congestion    Runny nose    HPI JAILEIGH TRACHTMAN is a 72 y.o. female.   Pleasant 72 year old female presents today due to concerns of sinusitis.  She did an e-visit on Wednesday and was prescribed Augmentin.  She is on her fourth day of antibiotics and states that her symptoms are not any better.  She reports a significant history of recurrent sinusitis in the past, and states that it almost always needs to be treated with prednisone.  She is requesting an injection of steroids in office.  She is currently on Coumadin but this is being discharged and removed from her medication list tomorrow.  She reports significant postnasal drainage, with a productive cough.  She states the cough is primarily clear or whitish, sometimes yellow in the morning.  All of her symptoms are worse at night.  Patient denies any fever.  No shortness of breath or chest pain.  She has been using Flonase and a Nettie pot in addition to the Augmentin.   Cough   Past Medical History:  Diagnosis Date   Abnormal cardiovascular stress test 08/21/2016   a. done for abnl EKG/cardiac risk factors -> admitted for cath 07/2016 which showed no significant CAD, normal LV contraction, mildly elevated filling pressure. Low dose Imdur was added for possible component of microvascular dysfunction.    Allergy    Anxiety    Arthritis    "back, knees, fingers" (05/10/2017)   Chronic lower back pain    Colon polyps    Deafness in right ear    Depression    Diverticulosis    Hx of adenomatous colonic polyps 05/08/2003   Hypertension    Hypertriglyceridemia    Hypothyroidism    Migraine    "none in the 2000s" (05/10/2017)   Osteoporosis    Paroxysmal atrial fibrillation (HCC)    Pneumonia    "several times" (05/10/2017)   PONV (postoperative  nausea and vomiting)    Presence of Watchman left atrial appendage closure device 03/04/2022   s/p LAAO with a 31 mm Watchman FLX by Dr. 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   Atrial fibrillation ([place]) 03/12/2021   Flu vaccine need 12/17/2020   Estrogen deficiency 05/15/2020   Hyperlipidemia LDL goal <70 05/12/2020   Recurrent major depressive disorder, in full remission ([place]) 05/12/2020   Stage 3a chronic kidney disease ([place]) 03/27/2020   Dyslipidemia, goal LDL below 100 03/27/2020   Pure hyperglyceridemia 03/27/2020   Diuretic-induced hypokalemia 03/27/2020   Primary hypertension 03/26/2020   PAF (paroxysmal atrial fibrillation) ([place]) 05/10/2017   Hypothyroidism 02/24/2017   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   S/P cardiac catheterization, 08/20/16 minimal CAD  08/21/2016   Unstable angina (HCC)    DDD (degenerative disc disease), lumbar 06/29/2016   Meniere's disease 06/29/2016   Type 2 diabetes mellitus without complication, without long-term current use of insulin ([place]) 09/30/2014    Past Surgical History:  Procedure Laterality Date   ABDOMINAL HYSTERECTOMY  1988   ATRIAL FIBRILLATION ABLATION N/A 05/10/2017   Procedure: ATRIAL FIBRILLATION ABLATION;  Surgeon: Thompson Grayer, MD;  Location: [facility];  Service: Cardiovascular;  Laterality: N/A;   COCHLEAR IMPLANT Right 2008   COLONOSCOPY     LEFT ATRIAL APPENDAGE OCCLUSION N/A 03/04/2022   Procedure: LEFT ATRIAL APPENDAGE OCCLUSION;  Surgeon: Vickie Epley, MD;  Location: [facility];  Service: Cardiovascular;  Laterality: N/A;   LEFT HEART CATH AND CORONARY ANGIOGRAPHY N/A 08/20/2016   Procedure:  Left Heart Cath and Coronary Angiography;  Surgeon: Nelva Bush, MD;  Location: [facility];  Service: Cardiovascular;  Laterality: N/A;   LOOP RECORDER INSERTION N/A 11/29/2017   Procedure: LOOP RECORDER INSERTION;  Surgeon: Thompson Grayer, MD;  Location: [facility];  Service: Cardiovascular;  Laterality: N/A;   ORIF ANKLE FRACTURE Right 02/06/2019   Procedure: OPEN REDUCTION INTERNAL FIXATION (ORIF) ANKLE FRACTURE LATERAL MALLEOLUS, SYNDESMOSIS, RIGHT;  Surgeon: Erle Crocker, MD;  Location: [facility];  Service: Orthopedics;  Laterality: Right;  SURGERY REQUEST TIME: 1.5 HOURS  CPT CODES: 16109, [id number], [id number], [id number]   TEE WITHOUT CARDIOVERSION N/A 03/04/2022   Procedure: TRANSESOPHAGEAL ECHOCARDIOGRAM (TEE);  Surgeon: Vickie Epley, MD;  Location: [facility];  Service: Cardiovascular;  Laterality: N/A;    OB History   No obstetric history on file.      Home Medications    Prior to Admission medications   Medication Sig Start Date End Date Taking? Authorizing Provider  amoxicillin-clavulanate (AUGMENTIN) 875-125 MG tablet Take 1 tablet by mouth 2 (two) times daily. 04/15/22  Yes [provider]  Azelastine HCl 137 MCG/SPRAY SOLN Place 1 spray into the nose 2 (two) times daily. 04/17/22  Yes Ivin Rosenbloom L, PA  predniSONE (STERAPRED UNI-PAK 21 TAB) 10 MG (21) TBPK tablet Take by mouth daily. Take 6 tabs by mouth daily  for 1 days, then 5 tabs for 1 days, then 4 tabs for 1 days, then 3 tabs for 1 days, 2 tabs for 1 days, then 1 tab by mouth daily for 1 days 04/17/22  Yes Tomislav Micale L, PA  amLODipine (NORVASC) 10 MG tablet TAKE 1 TABLET BY MOUTH EVERY DAY 04/08/22   Libby Maw, MD  aspirin EC ([facility] ASPIRIN LOW DOSE) 81 MG tablet TAKE 1 TABLET (81 MG TOTAL) BY MOUTH DAILY. SWALLOW WHOLE. 04/13/22   Kathyrn Drown D, NP  atorvastatin (LIPITOR) 10 MG tablet Take 1 tablet (10 mg total) by mouth daily. 06/18/21   Libby Maw, MD  Blood Glucose Calibration (ONETOUCH VERIO) SOLN 1 Act by In Vitro route in the morning and at bedtime. 06/09/20   Janith Lima, MD  Blood Glucose Monitoring Suppl (ONETOUCH VERIO FLEX SYSTEM) w/Device KIT 1 Act by Does not apply route in the morning and at bedtime. 05/12/20   Janith Lima, MD  clopidogrel (PLAVIX) 75 MG tablet Take 1 tablet (75 mg total) by mouth daily. START ON 2/26 AFTER STOPPING COUMADIN ON 2/25 04/07/22   Kathyrn Drown D, NP  diclofenac Sodium (VOLTAREN) 1 % GEL Apply a small grape sized dollop to tender to meet up to 4 times daily as needed 06/19/21   Libby Maw, MD  diphenhydrAMINE HCl, Sleep, (SLEEP-AID) 50 MG CAPS Take 50 mg by mouth at bedtime.    [provider]  DULoxetine (CYMBALTA) 60 MG capsule Take 1 capsule (60 mg total)  by mouth daily. 06/19/21   Libby Maw, MD  fluticasone [facility]) 50 MCG/ACT nasal spray Place 1 spray into both nostrils daily. Begin by using 2 sprays in each nare daily for 3 to 5 days, then decrease to 1 spray in each nare daily. 02/11/22   Lynden Oxford Scales, PA-C  glucose blood (ONETOUCH VERIO) test strip 1 each by Other route in the morning and at bedtime. Use as instructed 05/12/20   Janith Lima, MD  hydrALAZINE (APRESOLINE) 50 MG tablet TAKE 1 TABLET BY MOUTH TWICE A DAY 02/13/22   Libby Maw, MD  irbesartan (AVAPRO) 300 MG tablet TAKE 1 TABLET BY MOUTH EVERY DAY 11/17/21   Libby Maw, MD  Lancets [facility] ULTRASOFT) lancets 1 each by Other route in the morning and at bedtime. Use as instructed 05/12/20   Janith Lima, MD  levothyroxine (SYNTHROID) 75 MCG tablet Take 1 tablet (75 mcg total) by mouth daily. 03/29/22   Libby Maw, MD  metFORMIN (GLUCOPHAGE) 500 MG tablet TAKE 1 TABLET BY MOUTH TWICE A DAY WITH FOOD 02/13/22   Libby Maw, MD  OnabotulinumtoxinA (BOTOX IJ) Inject as directed as needed (spastic voic).    [provider]  torsemide (DEMADEX) 20 MG tablet TAKE 1 TABLET BY MOUTH EVERY DAY 11/17/21   Libby Maw, MD  warfarin (COUMADIN) 5 MG tablet TAKE 1 TABLET (5 MG TOTAL) BY MOUTH DAILY. Patient taking differently: Take 2.5-5 mg by mouth See admin instructions. Take 2.5 mg mon., Wed., and Friday, all the other days take 5 mg 11/17/21   Evans Lance, MD    Family History Family History  Problem Relation Age of Onset   Hypertension Mother    Diabetes Mother    Heart attack Mother    Colon polyps Mother    Hypertension Father    Heart attack Father    Stroke Father    Breast cancer Sister 3   Breast cancer Maternal Aunt        in 30's   Breast cancer Maternal Aunt    Cancer Maternal Uncle        Lung   Hypertension Brother    Cancer Brother        Prostate   Diabetes Brother    Dementia Brother    Stroke Brother    Heart attack Brother    Prostate cancer Brother    Hypertension Son    Colon cancer Neg Hx    Esophageal cancer Neg Hx    Rectal cancer Neg Hx    Stomach cancer Neg Hx     Social History Social History   Tobacco Use   Smoking status: Never   Smokeless tobacco: Never  Vaping Use   Vaping Use: Never used  Substance Use Topics   Alcohol use: No   Drug use: No     Allergies   Amlodipine, Demeclocycline, Doxycycline, Erythromycin, Lisinopril, and Tetracyclines & related   Review of Systems Review of Systems  Respiratory:  Positive for cough.   As per HPI   Physical Exam Triage Vital Signs ED Triage Vitals  Enc Vitals Group     BP 04/17/22 1251 (!) 145/76     Pulse Rate 04/17/22 1251 76     Resp 04/17/22 1251 17     Temp 04/17/22 1251 98.2 F (36.8 C)     Temp Source 04/17/22 1251 Oral     SpO2 04/17/22 1251 97 %  Weight --      Height --      Head Circumference --      Peak Flow --      Pain Score 04/17/22 1252 0     Pain Loc --      Pain Edu? --      Excl. in [place]? --    No data found.  Updated Vital Signs BP (!) 145/76 (BP  Location: Right Arm)   Pulse 76   Temp 98.2 F (36.8 C) (Oral)   Resp 17   SpO2 97%   Visual Acuity Right Eye Distance:   Left Eye Distance:   Bilateral Distance:    Right Eye Near:   Left Eye Near:    Bilateral Near:     Physical Exam Vitals and nursing note reviewed.  Constitutional:      General: She is not in acute distress.    Appearance: Normal appearance. She is well-developed. She is not ill-appearing, toxic-appearing or diaphoretic.  HENT:     Head: Normocephalic and atraumatic.     Right Ear: Tympanic membrane, ear canal and external ear normal. There is no impacted cerumen.     Left Ear: Tympanic membrane, ear canal and external ear normal. There is no impacted cerumen.     Nose: Congestion and rhinorrhea present.     Right Turbinates: Enlarged and swollen.     Left Turbinates: Enlarged and swollen.     Right Sinus: No maxillary sinus tenderness or frontal sinus tenderness.     Left Sinus: No maxillary sinus tenderness or frontal sinus tenderness.     Mouth/Throat:     Mouth: Mucous membranes are moist.     Pharynx: Oropharynx is clear. No oropharyngeal exudate or posterior oropharyngeal erythema.  Eyes:     General: No scleral icterus.       Right eye: No discharge.        Left eye: No discharge.     Extraocular Movements: Extraocular movements intact.     Conjunctiva/sclera: Conjunctivae normal.     Pupils: Pupils are equal, round, and reactive to light.  Cardiovascular:     Rate and Rhythm: Normal rate and regular rhythm.  Pulmonary:     Effort: Pulmonary effort is normal. No respiratory distress.     Breath sounds: Normal breath sounds. No stridor. No wheezing.  Musculoskeletal:        General: No swelling.     Cervical back: Normal range of motion and neck supple. No rigidity or tenderness.  Lymphadenopathy:     Cervical: No cervical adenopathy.  Skin:    General: Skin is warm and dry.     Capillary Refill: Capillary refill takes less than 2  seconds.  Neurological:     Mental Status: She is alert.  Psychiatric:        Mood and Affect: Mood normal.      UC Treatments / Results  Labs (all labs ordered are listed, but only abnormal results are displayed) Labs Reviewed - No data to display  EKG   Radiology No results found.  Procedures Procedures (including critical care time)  Medications Ordered in UC Medications  dexamethasone (DECADRON) injection 10 mg (10 mg Intramuscular Given 04/17/22 1325)    Initial Impression / Assessment and Plan / UC Course  I have reviewed the triage vital signs and the nursing notes.  Pertinent labs & imaging results that were available during my care of the patient were reviewed by me and considered in  my medical decision making (see chart for details).     Nasal congestion -likely viral versus allergic.  Will have patient do a NeilMed sinus rinse in addition to azelastine nasal spray. Acute viral sinusitis -patient is already on day 4 of Augmentin with no improvement to her symptoms.  This suggests a viral cause.  Patient has taken and tolerated prednisone in the past.  She will be stopping her Coumadin tomorrow.  Therefore IM Decadron given in office, will DC home with prednisone taper pack   Final Clinical Impressions(s) / UC Diagnoses   Final diagnoses:  Nasal congestion  Post-nasal drainage  Acute viral sinusitis     Discharge Instructions      You were given a steroid injection in our office. I discharged you home on a prednisone taper pack. Do not start the oral pills until tomorrow, 04/18/2022. Please also use the nasal spray called in today.  Please switch from a Nettie pot to the NeilMed sinus rinse or the sinugator.  You can purchase this over-the-counter at any pharmacy.       ED Prescriptions     Medication Sig Dispense Auth. Provider   predniSONE (STERAPRED UNI-PAK 21 TAB) 10 MG (21) TBPK tablet Take by mouth daily. Take 6 tabs by mouth daily  for 1  days, then 5 tabs for 1 days, then 4 tabs for 1 days, then 3 tabs for 1 days, 2 tabs for 1 days, then 1 tab by mouth daily for 1 days 21 tablet Alis Sawchuk L, PA   Azelastine HCl 137 MCG/SPRAY SOLN Place 1 spray into the nose 2 (two) times daily. 30 mL Quenesha Douglass L, PA      PDMP not reviewed this encounter.   Chaney Malling, [place] 04/17/22 1329

## 2022-04-19 ENCOUNTER — Encounter: Payer: Self-pay | Admitting: Family Medicine

## 2022-04-20 NOTE — Telephone Encounter (Signed)
Appointment scheduled already 04/23/22

## 2022-04-22 DIAGNOSIS — E785 Hyperlipidemia, unspecified: Secondary | ICD-10-CM | POA: Diagnosis not present

## 2022-04-22 DIAGNOSIS — E119 Type 2 diabetes mellitus without complications: Secondary | ICD-10-CM | POA: Diagnosis not present

## 2022-04-23 ENCOUNTER — Ambulatory Visit: Payer: Medicare Other

## 2022-04-23 ENCOUNTER — Encounter: Payer: Self-pay | Admitting: Family Medicine

## 2022-04-23 ENCOUNTER — Ambulatory Visit (INDEPENDENT_AMBULATORY_CARE_PROVIDER_SITE_OTHER): Payer: Medicare Other | Admitting: Family Medicine

## 2022-04-23 VITALS — BP 120/68 | HR 62 | Temp 97.5°F | Ht 67.0 in | Wt 175.0 lb

## 2022-04-23 DIAGNOSIS — E039 Hypothyroidism, unspecified: Secondary | ICD-10-CM | POA: Diagnosis not present

## 2022-04-23 DIAGNOSIS — N1831 Chronic kidney disease, stage 3a: Secondary | ICD-10-CM | POA: Diagnosis not present

## 2022-04-23 DIAGNOSIS — Z9189 Other specified personal risk factors, not elsewhere classified: Secondary | ICD-10-CM | POA: Diagnosis not present

## 2022-04-23 DIAGNOSIS — E119 Type 2 diabetes mellitus without complications: Secondary | ICD-10-CM | POA: Diagnosis not present

## 2022-04-23 DIAGNOSIS — E7800 Pure hypercholesterolemia, unspecified: Secondary | ICD-10-CM | POA: Diagnosis not present

## 2022-04-23 LAB — URINALYSIS, ROUTINE W REFLEX MICROSCOPIC
Bilirubin Urine: NEGATIVE
Hgb urine dipstick: NEGATIVE
Ketones, ur: NEGATIVE
Leukocytes,Ua: NEGATIVE
Nitrite: NEGATIVE
RBC / HPF: NONE SEEN (ref 0–?)
Specific Gravity, Urine: 1.01 (ref 1.000–1.030)
Total Protein, Urine: NEGATIVE
Urine Glucose: NEGATIVE
Urobilinogen, UA: 0.2 (ref 0.0–1.0)
pH: 6 (ref 5.0–8.0)

## 2022-04-23 LAB — LIPID PANEL
Cholesterol: 124 mg/dL (ref 0–200)
HDL: 46.2 mg/dL (ref >39.00)
LDL Cholesterol: 46 mg/dL (ref 0–99)
NonHDL: 78.26
Total CHOL/HDL Ratio: 3
Triglycerides: 161 mg/dL — ABNORMAL HIGH (ref 0.0–149.0)
VLDL: 32.2 mg/dL (ref 0.0–40.0)

## 2022-04-23 LAB — MICROALBUMIN / CREATININE URINE RATIO
Creatinine,U: 50.1 mg/dL
Microalb Creat Ratio: 1.4 mg/g (ref 0.0–30.0)
Microalb, Ur: <0.7 mg/dL (ref 0.0–1.9)

## 2022-04-23 LAB — TSH: TSH: 1 u[IU]/mL (ref 0.35–5.50)

## 2022-04-23 NOTE — Progress Notes (Signed)
Established Patient Office Visit   Subjective:  Patient ID: Ashley Woodard, female    DOB: 1951-01-27  Age: 72 y.o. MRN: [id number]  Chief Complaint  Patient presents with   Medical Management of Chronic Issues    6 month follow up discuss medication interactions. Patient fasting.     HPI Encounter Diagnoses  Name Primary?   Stage 3a chronic kidney disease ([place]) Yes   At risk for adverse drug interaction    Type 2 diabetes mellitus without complication, without long-term current use of insulin (HCC)    Hypothyroidism, unspecified type    Elevated LDL cholesterol level    For follow-up of above.  Continue his healthy lifestyle with regular exercise including cardio and weightlifting.  She is now off of the warfarin and and is now only taking Plavix.  Concerned about the interaction that is theoretical between Cymbalta and Plavix.  She has seen no blood in her urine or stool.  She has been constipated and stooling only weekly.  Stools have been darker.  Up-to-date on colonoscopy.  Follow-up TSH status post lowering the dosage of levothyroxine to 75 mcg.  Continues with low-dose atorvastatin.   Review of Systems  Constitutional: Negative.   HENT: Negative.    Eyes:  Negative for blurred vision, discharge and redness.  Respiratory: Negative.    Cardiovascular: Negative.   Gastrointestinal:  Positive for constipation. Negative for abdominal pain and blood in stool.  Genitourinary: Negative.   Musculoskeletal: Negative.  Negative for myalgias.  Skin:  Negative for rash.  Neurological:  Negative for tingling, loss of consciousness and weakness.  Endo/Heme/Allergies:  Negative for polydipsia.     Current Outpatient Medications:    amLODipine (NORVASC) 10 MG tablet, TAKE 1 TABLET BY MOUTH EVERY DAY, Disp: 90 tablet, Rfl: 1   aspirin EC ([facility] ASPIRIN LOW DOSE) 81 MG tablet, TAKE 1 TABLET (81 MG TOTAL) BY MOUTH DAILY. SWALLOW WHOLE., Disp: 90 tablet, Rfl: 3   atorvastatin (LIPITOR) 10 MG  tablet, Take 1 tablet (10 mg total) by mouth daily., Disp: 90 tablet, Rfl: 3   Azelastine HCl 137 MCG/SPRAY SOLN, Place 1 spray into the nose 2 (two) times daily., Disp: 30 mL, Rfl: 0   Blood Glucose Calibration (ONETOUCH VERIO) SOLN, 1 Act by In Vitro route in the morning and at bedtime., Disp: 1 each, Rfl: 5   Blood Glucose Monitoring Suppl (ONETOUCH VERIO FLEX SYSTEM) w/Device KIT, 1 Act by Does not apply route in the morning and at bedtime., Disp: 1 kit, Rfl: 3   clopidogrel (PLAVIX) 75 MG tablet, Take 1 tablet (75 mg total) by mouth daily. START ON 2/26 AFTER STOPPING COUMADIN ON 2/25, Disp: 90 tablet, Rfl: 3   diphenhydrAMINE HCl, Sleep, (SLEEP-AID) 50 MG CAPS, Take 50 mg by mouth at bedtime., Disp: , Rfl:    DULoxetine (CYMBALTA) 60 MG capsule, Take 1 capsule (60 mg total) by mouth daily., Disp: 90 capsule, Rfl: 3   glucose blood (ONETOUCH VERIO) test strip, 1 each by Other route in the morning and at bedtime. Use as instructed, Disp: 100 each, Rfl: 12   hydrALAZINE (APRESOLINE) 50 MG tablet, TAKE 1 TABLET BY MOUTH TWICE A DAY, Disp: 180 tablet, Rfl: 0   irbesartan (AVAPRO) 300 MG tablet, TAKE 1 TABLET BY MOUTH EVERY DAY, Disp: 90 tablet, Rfl: 0   Lancets (ONETOUCH ULTRASOFT) lancets, 1 each by Other route in the morning and at bedtime. Use as instructed, Disp: 100 each, Rfl: 5   levothyroxine (SYNTHROID)  75 MCG tablet, Take 1 tablet (75 mcg total) by mouth daily., Disp: 90 tablet, Rfl: 0   metFORMIN (GLUCOPHAGE) 500 MG tablet, TAKE 1 TABLET BY MOUTH TWICE A DAY WITH FOOD, Disp: 180 tablet, Rfl: 0   torsemide (DEMADEX) 20 MG tablet, TAKE 1 TABLET BY MOUTH EVERY DAY, Disp: 90 tablet, Rfl: 0   diclofenac Sodium (VOLTAREN) 1 % GEL, Apply a small grape sized dollop to tender to meet up to 4 times daily as needed (Patient not taking: Reported on 04/23/2022), Disp: 150 g, Rfl: 2   fluticasone (FLONASE) 50 MCG/ACT nasal spray, Place 1 spray into both nostrils daily. Begin by using 2 sprays in each nare  daily for 3 to 5 days, then decrease to 1 spray in each nare daily. (Patient not taking: Reported on 04/23/2022), Disp: 31.6 mL, Rfl: 1   OnabotulinumtoxinA (BOTOX IJ), Inject as directed as needed (spastic voic). (Patient not taking: Reported on 04/23/2022), Disp: , Rfl:    warfarin (COUMADIN) 5 MG tablet, TAKE 1 TABLET (5 MG TOTAL) BY MOUTH DAILY. (Patient taking differently: Take 2.5-5 mg by mouth See admin instructions. Take 2.5 mg mon., Wed., and Friday, all the other days take 5 mg), Disp: 90 tablet, Rfl: 1  Current Facility-Administered Medications:    botulinum toxin Type A (BOTOX) injection 100 Units, 100 Units, Intramuscular, Once, Marcial Pacas, MD   Objective:     BP 120/68 (BP Location: Right Arm, Patient Position: Sitting, Cuff Size: Normal)   Pulse 62   Temp (!) 97.5 F (36.4 C) (Temporal)   Ht '5\' 7"'$  (1.702 m)   Wt 175 lb (79.4 kg)   SpO2 99%   BMI 27.41 kg/m    Physical Exam Constitutional:      General: She is not in acute distress.    Appearance: Normal appearance. She is not ill-appearing, toxic-appearing or diaphoretic.  HENT:     Head: Normocephalic and atraumatic.     Right Ear: External ear normal.     Left Ear: External ear normal.  Eyes:     General: No scleral icterus.       Right eye: No discharge.        Left eye: No discharge.     Extraocular Movements: Extraocular movements intact.     Conjunctiva/sclera: Conjunctivae normal.  Cardiovascular:     Rate and Rhythm: Normal rate and regular rhythm.     Pulses:          Dorsalis pedis pulses are 1+ on the right side and 1+ on the left side.       Posterior tibial pulses are 1+ on the right side and 1+ on the left side.  Pulmonary:     Effort: Pulmonary effort is normal. No respiratory distress.     Breath sounds: Normal breath sounds.  Abdominal:     General: Bowel sounds are normal.  Musculoskeletal:     Cervical back: No rigidity or tenderness.  Lymphadenopathy:     Cervical: No cervical adenopathy.   Skin:    General: Skin is warm and dry.  Neurological:     Mental Status: She is alert and oriented to person, place, and time.  Psychiatric:        Mood and Affect: Mood normal.        Behavior: Behavior normal.      No results found for any visits on 04/23/22.    The ASCVD Risk score (Arnett DK, et al., 2019) failed to calculate for the following  reasons:   The valid total cholesterol range is 130 to 320 mg/dL    Assessment & Plan:   Stage 3a chronic kidney disease (HCC)  At risk for adverse drug interaction  Type 2 diabetes mellitus without complication, without long-term current use of insulin (HCC) -     Urinalysis, Routine w reflex microscopic -     Microalbumin / creatinine urine ratio  Hypothyroidism, unspecified type -     TSH  Elevated LDL cholesterol level -     Lipid panel    Return in about 3 months (around 07/24/2022), or suggested SGTL2 inhibitor..  Drug interaction between Cymbalta and Plavix is theoretical.  Patient will be on the look out for blood in her stool or urine.  She has been constipated.  Okayed MiraLAX usage.  Stools have been dark.  Recheck back in 3 months.  Rechecking TSH after lowering levothyroxine to 75 mcg.  Follow-up on elevated LDL.  She will check with her insurance on coverage for Farxiga.  Libby Maw, MD

## 2022-05-02 ENCOUNTER — Encounter: Payer: Self-pay | Admitting: Cardiology

## 2022-05-06 ENCOUNTER — Telehealth (HOSPITAL_COMMUNITY): Payer: Self-pay | Admitting: *Deleted

## 2022-05-06 ENCOUNTER — Telehealth (HOSPITAL_COMMUNITY): Payer: Self-pay | Admitting: Emergency Medicine

## 2022-05-06 NOTE — Telephone Encounter (Signed)
Attempted to call patient regarding upcoming cardiac CT appointment. °Left message on voicemail with name and callback number °Doral Digangi RN Navigator Cardiac Imaging °[facility] °[phone] Office °[phone] Cell ° °

## 2022-05-06 NOTE — Telephone Encounter (Signed)
Reaching out to patient to offer assistance regarding upcoming cardiac imaging study; pt verbalizes understanding of appt date/time, parking situation and where to check in, pre-test NPO status and verified current allergies; name and call back number provided for further questions should they arise ? ?Caria Transue RN Navigator Cardiac Imaging ? Heart and Vascular ?[phone] office ?[phone] cell ? ?Patient aware to arrive at 1pm. ?

## 2022-05-07 ENCOUNTER — Ambulatory Visit (HOSPITAL_COMMUNITY)
Admission: RE | Admit: 2022-05-07 | Discharge: 2022-05-07 | Disposition: A | Discharge status: Home / Self Care | Payer: Medicare Other | Source: Ambulatory Visit | Attending: Cardiology | Admitting: Cardiology

## 2022-05-07 DIAGNOSIS — I480 Paroxysmal atrial fibrillation: Secondary | ICD-10-CM | POA: Diagnosis not present

## 2022-05-07 DIAGNOSIS — Z95818 Presence of other cardiac implants and grafts: Secondary | ICD-10-CM

## 2022-05-07 MED ORDER — IOHEXOL 350 MG/ML SOLN
95.0000 mL | Freq: Once | INTRAVENOUS | Status: AC | PRN
  Administered 2022-05-07: 95 mL via INTRAVENOUS

## 2022-05-10 ENCOUNTER — Telehealth: Payer: Self-pay

## 2022-05-10 NOTE — Telephone Encounter (Signed)
Reviewed results with patient who verbalized understanding.   Confirmed she STOP COUMADIN and STARTED PLAVIX.  Confirmed 6 month visit in July 2024. She was grateful for call and agreed with plan.

## 2022-05-10 NOTE — Telephone Encounter (Signed)
-----   Message from Vickie Epley, MD sent at 05/08/2022 10:12 PM EDT ----- CT reviewed. Watchman looks great. No leak. Appendage is completely sealed. OK to continue with planned post implant medical therapy.  Lysbeth Galas T. Quentin Ore, MD, [facility], [facility] Cardiac Electrophysiology

## 2022-05-23 DIAGNOSIS — Z6826 Body mass index (BMI) 26.0-26.9, adult: Secondary | ICD-10-CM | POA: Diagnosis not present

## 2022-05-23 DIAGNOSIS — Z6830 Body mass index (BMI) 30.0-30.9, adult: Secondary | ICD-10-CM | POA: Diagnosis not present

## 2022-05-23 DIAGNOSIS — E1122 Type 2 diabetes mellitus with diabetic chronic kidney disease: Secondary | ICD-10-CM | POA: Diagnosis not present

## 2022-05-23 DIAGNOSIS — E782 Mixed hyperlipidemia: Secondary | ICD-10-CM | POA: Diagnosis not present

## 2022-05-23 DIAGNOSIS — E785 Hyperlipidemia, unspecified: Secondary | ICD-10-CM | POA: Diagnosis not present

## 2022-05-23 DIAGNOSIS — I10 Essential (primary) hypertension: Secondary | ICD-10-CM | POA: Diagnosis not present

## 2022-05-23 DIAGNOSIS — E669 Obesity, unspecified: Secondary | ICD-10-CM | POA: Diagnosis not present

## 2022-05-23 DIAGNOSIS — E119 Type 2 diabetes mellitus without complications: Secondary | ICD-10-CM | POA: Diagnosis not present

## 2022-05-23 DIAGNOSIS — N189 Chronic kidney disease, unspecified: Secondary | ICD-10-CM | POA: Diagnosis not present

## 2022-05-24 ENCOUNTER — Ambulatory Visit (INDEPENDENT_AMBULATORY_CARE_PROVIDER_SITE_OTHER): Payer: Medicare Other

## 2022-05-24 VITALS — Ht 67.0 in | Wt 170.0 lb

## 2022-05-24 DIAGNOSIS — Z0000 Encounter for general adult medical examination without abnormal findings: Secondary | ICD-10-CM

## 2022-05-24 NOTE — Patient Instructions (Signed)
Ashley Woodard , Thank you for taking time to come for your Medicare Wellness Visit. I appreciate your ongoing commitment to your health goals. Please review the following plan we discussed and let me know if I can assist you in the future.   These are the goals we discussed:  Goals      Patient Stated     Would like to increase activity     Patient Stated     05/24/2022, wants to weigh 160 pounds        This is a list of the screening recommended for you and due dates:  Health Maintenance  Topic Date Due   Complete foot exam   03/26/2021   Eye exam for diabetics  06/13/2022   Hemoglobin A1C  09/16/2022   Flu Shot  09/23/2022   Mammogram  11/13/2022   Yearly kidney function blood test for diabetes  04/08/2023   Yearly kidney health urinalysis for diabetes  04/23/2023   Medicare Annual Wellness Visit  05/24/2023   DTaP/Tdap/Td vaccine (2 - Td or Tdap) 03/13/2024   Colon Cancer Screening  10/05/2025   Pneumonia Vaccine  Completed   DEXA scan (bone density measurement)  Completed   Hepatitis C Screening: USPSTF Recommendation to screen - Ages 54-79 yo.  Completed   HPV Vaccine  Aged Out   COVID-19 Vaccine  Discontinued   Zoster (Shingles) Vaccine  Discontinued    Advanced directives: Please bring a copy of your POA (Power of Attorney) and/or Living Will to your next appointment.   Conditions/risks identified: none  Next appointment: Follow up in one year for your annual wellness visit    Preventive Care 65 Years and Older, Female Preventive care refers to lifestyle choices and visits with your health care provider that can promote health and wellness. What does preventive care include? A yearly physical exam. This is also called an annual well check. Dental exams once or twice a year. Routine eye exams. Ask your health care provider how often you should have your eyes checked. Personal lifestyle choices, including: Daily care of your teeth and gums. Regular physical  activity. Eating a healthy diet. Avoiding tobacco and drug use. Limiting alcohol use. Practicing safe sex. Taking low-dose aspirin every day. Taking vitamin and mineral supplements as recommended by your health care provider. What happens during an annual well check? The services and screenings done by your health care provider during your annual well check will depend on your age, overall health, lifestyle risk factors, and family history of disease. Counseling  Your health care provider may ask you questions about your: Alcohol use. Tobacco use. Drug use. Emotional well-being. Home and relationship well-being. Sexual activity. Eating habits. History of falls. Memory and ability to understand (cognition). Work and work Statistician. Reproductive health. Screening  You may have the following tests or measurements: Height, weight, and BMI. Blood pressure. Lipid and cholesterol levels. These may be checked every 5 years, or more frequently if you are over 47 years old. Skin check. Lung cancer screening. You may have this screening every year starting at age 5 if you have a 30-pack-year history of smoking and currently smoke or have quit within the past 15 years. Fecal occult blood test (FOBT) of the stool. You may have this test every year starting at age 66. Flexible sigmoidoscopy or colonoscopy. You may have a sigmoidoscopy every 5 years or a colonoscopy every 10 years starting at age 23. Hepatitis C blood test. Hepatitis B blood test. Sexually transmitted  disease (STD) testing. Diabetes screening. This is done by checking your blood sugar (glucose) after you have not eaten for a while (fasting). You may have this done every 1-3 years. Bone density scan. This is done to screen for osteoporosis. You may have this done starting at age 4. Mammogram. This may be done every 1-2 years. Talk to your health care provider about how often you should have regular mammograms. Talk with your  health care provider about your test results, treatment options, and if necessary, the need for more tests. Vaccines  Your health care provider may recommend certain vaccines, such as: Influenza vaccine. This is recommended every year. Tetanus, diphtheria, and acellular pertussis (Tdap, Td) vaccine. You may need a Td booster every 10 years. Zoster vaccine. You may need this after age 26. Pneumococcal 13-valent conjugate (PCV13) vaccine. One dose is recommended after age 48. Pneumococcal polysaccharide (PPSV23) vaccine. One dose is recommended after age 56. Talk to your health care provider about which screenings and vaccines you need and how often you need them. This information is not intended to replace advice given to you by your health care provider. Make sure you discuss any questions you have with your health care provider. Document Released: 03/07/2015 Document Revised: 10/29/2015 Document Reviewed: 12/10/2014 Elsevier Interactive Patient Education  2017 [employer] Prevention in the Home Falls can cause injuries. They can happen to people of all ages. There are many things you can do to make your home safe and to help prevent falls. What can I do on the outside of my home? Regularly fix the edges of walkways and driveways and fix any cracks. Remove anything that might make you trip as you walk through a door, such as a raised step or threshold. Trim any bushes or trees on the path to your home. Use bright outdoor lighting. Clear any walking paths of anything that might make someone trip, such as rocks or tools. Regularly check to see if handrails are loose or broken. Make sure that both sides of any steps have handrails. Any raised decks and porches should have guardrails on the edges. Have any leaves, snow, or ice cleared regularly. Use sand or salt on walking paths during winter. Clean up any spills in your garage right away. This includes oil or grease spills. What can I  do in the bathroom? Use night lights. Install grab bars by the toilet and in the tub and shower. Do not use towel bars as grab bars. Use non-skid mats or decals in the tub or shower. If you need to sit down in the shower, use a plastic, non-slip stool. Keep the floor dry. Clean up any water that spills on the floor as soon as it happens. Remove soap buildup in the tub or shower regularly. Attach bath mats securely with double-sided non-slip rug tape. Do not have throw rugs and other things on the floor that can make you trip. What can I do in the bedroom? Use night lights. Make sure that you have a light by your bed that is easy to reach. Do not use any sheets or blankets that are too big for your bed. They should not hang down onto the floor. Have a firm chair that has side arms. You can use this for support while you get dressed. Do not have throw rugs and other things on the floor that can make you trip. What can I do in the kitchen? Clean up any spills right away. Avoid walking  on wet floors. Keep items that you use a lot in easy-to-reach places. If you need to reach something above you, use a strong step stool that has a grab bar. Keep electrical cords out of the way. Do not use floor polish or wax that makes floors slippery. If you must use wax, use non-skid floor wax. Do not have throw rugs and other things on the floor that can make you trip. What can I do with my stairs? Do not leave any items on the stairs. Make sure that there are handrails on both sides of the stairs and use them. Fix handrails that are broken or loose. Make sure that handrails are as long as the stairways. Check any carpeting to make sure that it is firmly attached to the stairs. Fix any carpet that is loose or worn. Avoid having throw rugs at the top or bottom of the stairs. If you do have throw rugs, attach them to the floor with carpet tape. Make sure that you have a light switch at the top of the stairs  and the bottom of the stairs. If you do not have them, ask someone to add them for you. What else can I do to help prevent falls? Wear shoes that: Do not have high heels. Have rubber bottoms. Are comfortable and fit you well. Are closed at the toe. Do not wear sandals. If you use a stepladder: Make sure that it is fully opened. Do not climb a closed stepladder. Make sure that both sides of the stepladder are locked into place. Ask someone to hold it for you, if possible. Clearly mark and make sure that you can see: Any grab bars or handrails. First and last steps. Where the edge of each step is. Use tools that help you move around (mobility aids) if they are needed. These include: Canes. Walkers. Scooters. Crutches. Turn on the lights when you go into a dark area. Replace any light bulbs as soon as they burn out. Set up your furniture so you have a clear path. Avoid moving your furniture around. If any of your floors are uneven, fix them. If there are any pets around you, be aware of where they are. Review your medicines with your doctor. Some medicines can make you feel dizzy. This can increase your chance of falling. Ask your doctor what other things that you can do to help prevent falls. This information is not intended to replace advice given to you by your health care provider. Make sure you discuss any questions you have with your health care provider. Document Released: 12/05/2008 Document Revised: 07/17/2015 Document Reviewed: 03/15/2014 Elsevier Interactive Patient Education  2017 [employer].

## 2022-05-24 NOTE — Progress Notes (Signed)
I connected with  Ashley Woodard on 05/24/22 by a audio enabled telemedicine application and verified that I am speaking with the correct person using two identifiers.  Patient Location: Home  Provider Location: Office/Clinic  I discussed the limitations of evaluation and management by telemedicine. The patient expressed understanding and agreed to proceed.  Subjective:   Ashley Woodard is a 72 y.o. female who presents for Medicare Annual (Subsequent) preventive examination.  Patient Medicare AWV questionnaire was completed by the patient on 05/21/2022; I have confirmed that all information answered by patient is correct and no changes since this date.     Review of Systems     Cardiac Risk Factors include: advanced age (>40men, >22 women);diabetes mellitus;dyslipidemia;hypertension     Objective:    Today's Vitals   05/24/22 1011 05/24/22 1012  Weight: 170 lb (77.1 kg)   Height: 5\' 7"  (1.702 m)   PainSc:  3    Body mass index is 26.63 kg/m.     05/24/2022   10:17 AM 03/04/2022   10:51 AM 05/20/2021   10:39 AM 01/14/2020    3:52 PM 02/06/2019    9:06 AM 01/31/2019   11:20 AM 01/26/2019    5:41 PM  Advanced Directives  Does Patient Have a Medical Advance Directive? Yes Yes Yes Yes Yes Yes Yes  Type of Paramedic of [place];Living will [place];Living will [place];Living will [place];Living will [place];Living will  [place];Living will  Does patient want to make changes to medical advance directive?  No - Patient declined     No - Patient declined  Copy of [place] in Chart? No - copy requested No - copy requested No - copy requested No - copy requested No - copy requested  No - copy requested  Would patient like information on creating a medical advance directive?       No - Patient declined    Current Medications  (verified) Outpatient Encounter Medications as of 05/24/2022  Medication Sig   amLODipine (NORVASC) 10 MG tablet TAKE 1 TABLET BY MOUTH EVERY DAY   aspirin EC ([employer] ASPIRIN LOW DOSE) 81 MG tablet TAKE 1 TABLET (81 MG TOTAL) BY MOUTH DAILY. SWALLOW WHOLE.   atorvastatin (LIPITOR) 10 MG tablet Take 1 tablet (10 mg total) by mouth daily.   Blood Glucose Calibration (ONETOUCH VERIO) SOLN 1 Act by In Vitro route in the morning and at bedtime.   Blood Glucose Monitoring Suppl (ONETOUCH VERIO FLEX SYSTEM) w/Device KIT 1 Act by Does not apply route in the morning and at bedtime.   clopidogrel (PLAVIX) 75 MG tablet Take 1 tablet (75 mg total) by mouth daily. START ON 2/26 AFTER STOPPING COUMADIN ON 2/25   diphenhydrAMINE HCl, Sleep, (SLEEP-AID) 50 MG CAPS Take 50 mg by mouth at bedtime.   DULoxetine (CYMBALTA) 60 MG capsule Take 1 capsule (60 mg total) by mouth daily.   fluticasone (FLONASE) 50 MCG/ACT nasal spray Place 1 spray into both nostrils daily. Begin by using 2 sprays in each nare daily for 3 to 5 days, then decrease to 1 spray in each nare daily.   glucose blood (ONETOUCH VERIO) test strip 1 each by Other route in the morning and at bedtime. Use as instructed   hydrALAZINE (APRESOLINE) 50 MG tablet TAKE 1 TABLET BY MOUTH TWICE A DAY   irbesartan (AVAPRO) 300 MG tablet TAKE 1 TABLET BY MOUTH EVERY  DAY   Lancets (ONETOUCH ULTRASOFT) lancets 1 each by Other route in the morning and at bedtime. Use as instructed   levothyroxine (SYNTHROID) 75 MCG tablet Take 1 tablet (75 mcg total) by mouth daily.   metFORMIN (GLUCOPHAGE) 500 MG tablet TAKE 1 TABLET BY MOUTH TWICE A DAY WITH FOOD   torsemide (DEMADEX) 20 MG tablet TAKE 1 TABLET BY MOUTH EVERY DAY   Azelastine HCl 137 MCG/SPRAY SOLN Place 1 spray into the nose 2 (two) times daily.   diclofenac Sodium (VOLTAREN) 1 % GEL Apply a small grape sized dollop to tender to meet up to 4 times daily as needed (Patient not taking: Reported on 04/23/2022)    OnabotulinumtoxinA (BOTOX IJ) Inject as directed as needed (spastic voic). (Patient not taking: Reported on 04/23/2022)   Facility-Administered Encounter Medications as of 05/24/2022  Medication   botulinum toxin Type A (BOTOX) injection 100 Units    Allergies (verified) Amlodipine, Demeclocycline, Doxycycline, Erythromycin, Lisinopril, and Tetracyclines & related   History: Past Medical History:  Diagnosis Date   Abnormal cardiovascular stress test 08/21/2016   a. done for abnl EKG/cardiac risk factors -> admitted for cath 07/2016 which showed no significant CAD, normal LV contraction, mildly elevated filling pressure. Low dose Imdur was added for possible component of microvascular dysfunction.    Allergy    Anxiety    Arthritis    "back, knees, fingers" (05/10/2017)   Chronic lower back pain    Colon polyps    Deafness in right ear    Depression    Diverticulosis    Hx of adenomatous colonic polyps 05/08/2003   Hypertension    Hypertriglyceridemia    Hypothyroidism    Migraine    "none in the 2000s" (05/10/2017)   Osteoporosis    Paroxysmal atrial fibrillation    Pneumonia    "several times" (05/10/2017)   PONV (postoperative nausea and vomiting)    Presence of Watchman left atrial appendage closure device 03/04/2022   s/p LAAO with a 31 mm Watchman FLX by Dr. Quentin Woodard   S/P cardiac catheterization, 08/20/16 minimal CAD 08/21/2016   Type II diabetes mellitus    Voice tremor    Past Surgical History:  Procedure Laterality Date   ABDOMINAL HYSTERECTOMY  1988   ATRIAL FIBRILLATION ABLATION N/A 05/10/2017   Procedure: ATRIAL FIBRILLATION ABLATION;  Surgeon: Ashley Grayer, MD;  Location: [facility];  Service: Cardiovascular;  Laterality: N/A;   COCHLEAR IMPLANT Right 2008   COLONOSCOPY     LEFT ATRIAL APPENDAGE OCCLUSION N/A 03/04/2022   Procedure: LEFT ATRIAL APPENDAGE OCCLUSION;  Surgeon: Ashley Epley, MD;  Location: [place] [facility];  Service: Cardiovascular;   Laterality: N/A;   LEFT HEART CATH AND CORONARY ANGIOGRAPHY N/A 08/20/2016   Procedure: Left Heart Cath and Coronary Angiography;  Surgeon: Ashley Bush, MD;  Location: [facility];  Service: Cardiovascular;  Laterality: N/A;   LOOP RECORDER INSERTION N/A 11/29/2017   Procedure: LOOP RECORDER INSERTION;  Surgeon: Ashley Grayer, MD;  Location: [facility];  Service: Cardiovascular;  Laterality: N/A;   ORIF ANKLE FRACTURE Right 02/06/2019   Procedure: OPEN REDUCTION INTERNAL FIXATION (ORIF) ANKLE FRACTURE LATERAL MALLEOLUS, SYNDESMOSIS, RIGHT;  Surgeon: Erle Crocker, MD;  Location: [facility];  Service: Orthopedics;  Laterality: Right;  SURGERY REQUEST TIME: 1.5 HOURS  CPT CODES: 09811, [id number], [id number], [id number]   TEE WITHOUT CARDIOVERSION N/A 03/04/2022   Procedure: TRANSESOPHAGEAL ECHOCARDIOGRAM (TEE);  Surgeon: Ashley Epley, MD;  Location:  [facility];  Service: Cardiovascular;  Laterality: N/A;   watchmen procedure     02/2022   Family History  Problem Relation Age of Onset   Hypertension Mother    Diabetes Mother    Heart attack Mother    Colon polyps Mother    Hypertension Father    Heart attack Father    Stroke Father    Breast cancer Sister 78   Breast cancer Maternal Aunt        in 30's   Breast cancer Maternal Aunt    Cancer Maternal Uncle        Lung   Hypertension Brother    Cancer Brother        Prostate   Diabetes Brother    Dementia Brother    Stroke Brother    Heart attack Brother    Prostate cancer Brother    Hypertension Son    Colon cancer Neg Hx    Esophageal cancer Neg Hx    Rectal cancer Neg Hx    Stomach cancer Neg Hx    Social History   Socioeconomic History   Marital status: Married    Spouse name: Not on file   Number of children: 2   Years of education: 12   Highest education level: Not on file  Occupational History   Occupation: Health and safety inspector  Tobacco Use   Smoking status: Never    Smokeless tobacco: Never  Vaping Use   Vaping Use: Never used  Substance and Sexual Activity   Alcohol use: No   Drug use: No   Sexual activity: Not Currently  Other Topics Concern   Not on file  Social History Narrative   Lives at home with husband in [place].   Right-handed.   No caffeine use.   Manages a call center   Social Determinants of Health   Financial Resource Strain: Low Risk  (05/21/2022)   Overall Financial Resource Strain (CARDIA)    Difficulty of Paying Living Expenses: Not hard at all  Food Insecurity: No Food Insecurity (05/24/2022)   Hunger Vital Sign    Worried About Running Out of Food in the Last Year: Never true    Ran Out of Food in the Last Year: Never true  Transportation Needs: No Transportation Needs (05/21/2022)   PRAPARE - Hydrologist (Medical): No    Lack of Transportation (Non-Medical): No  Physical Activity: Sufficiently Active (05/21/2022)   Exercise Vital Sign    Days of Exercise per Week: 2 days    Minutes of Exercise per Session: 90 min  Stress: No Stress Concern Present (05/21/2022)   [place]    Feeling of Stress : Only a little  Social Connections: Unknown (05/21/2022)   Social Connection and Isolation Panel [NHANES]    Frequency of Communication with Friends and Family: Once a week    Frequency of Social Gatherings with Friends and Family: Twice a week    Attends Religious Services: Not on Advertising copywriter or Organizations: Yes    Attends Music therapist: More than 4 times per year    Marital Status: Married    Tobacco Counseling Counseling given: Not Answered   Clinical Intake:  Pre-visit preparation completed: Yes  Pain : 0-10 Pain Score: 3  Pain Type: Chronic pain Pain Location: Back Pain Orientation: Lower Pain Descriptors / Indicators: Aching Pain Onset: More than a  month ago Pain Frequency:  Constant     Nutritional Status: BMI 25 -29 Overweight Nutritional Risks: None Diabetes: Yes  How often do you need to have someone help you when you read instructions, pamphlets, or other written materials from your doctor or pharmacy?: 1 - Never  Diabetic? Yes Nutrition Risk Assessment:  Has the patient had any N/V/D within the last 2 months?  No  Does the patient have any non-healing wounds?  No  Has the patient had any unintentional weight loss or weight gain?  No   Diabetes:  Is the patient diabetic?  Yes  If diabetic, was a CBG obtained today?  No  Did the patient bring in their glucometer from home?  No  How often do you monitor your CBG's? daily.   Financial Strains and Diabetes Management:  Are you having any financial strains with the device, your supplies or your medication? No .  Does the patient want to be seen by Chronic Care Management for management of their diabetes?  No  Would the patient like to be referred to a Nutritionist or for Diabetic Management?  No   Diabetic Exams:  Diabetic Eye Exam: Completed 06/12/2021 Diabetic Foot Exam: Overdue, Pt has been advised about the importance in completing this exam. Pt is scheduled for diabetic foot exam on next appointment.   Interpreter Needed?: No  Information entered by :: NAllen LPN   Activities of Daily Living    05/21/2022   10:34 PM 03/04/2022   11:02 AM  In your present state of health, do you have any difficulty performing the following activities:  Hearing? 1 0  Comment deaf in right ear cochlear implant right ear  Vision? 0   Difficulty concentrating or making decisions? 0 0  Walking or climbing stairs? 0 0  Dressing or bathing? 0 0  Doing errands, shopping? 0   Preparing Food and eating ? N   Using the Toilet? N   In the past six months, have you accidently leaked urine? Y   Do you have problems with loss of bowel control? N   Managing your Medications? N   Managing your Finances? N    Housekeeping or managing your Housekeeping? N     Patient Care Team: Libby Maw, MD as PCP - General (Family Medicine) Ashley Epley, MD as PCP - Electrophysiology (Cardiology) Evans Lance, MD as PCP - Cardiology (Cardiology) Exie Parody, OD as Physician Assistant (Optometry) Melida Quitter, MD as Consulting Physician (Otolaryngology) Sueanne Margarita, MD as Consulting Physician (Cardiology) Exie Parody, OD as Physician Assistant (Optometry) Madelin Rear, [facility] (Inactive) as Pharmacist (Pharmacist)  Indicate any recent Medical Services you may have received from other than [facility] providers in the past year (date may be approximate).     Assessment:   This is a routine wellness examination for [place].  Hearing/Vision screen Vision Screening - Comments:: Regular eye exams, [place] in [place] issues and exercise activities discussed: Current Exercise Habits: Home exercise routine, Type of exercise: strength training/weights;treadmill, Time (Minutes): > 60, Frequency (Times/Week): 2, Weekly Exercise (Minutes/Week): 0   Goals Addressed             This Visit's Progress    Patient Stated       05/24/2022, wants to weigh 160 pounds       Depression Screen    05/24/2022   10:17 AM 04/23/2022    8:05 AM 03/18/2022    9:58 AM 10/23/2021  9:18 AM 06/19/2021    1:16 PM 05/20/2021   10:39 AM 05/20/2021   10:38 AM  PHQ 2/9 Scores  PHQ - 2 Score 0 0 0 0 0 0 0    Fall Risk    05/21/2022   10:34 PM 04/23/2022    8:05 AM 03/18/2022    9:58 AM 10/23/2021    9:18 AM 06/19/2021    1:16 PM  Fall Risk   Falls in the past year? 0 0 0 0 1  Number falls in past yr: 0 0 0 0 0  Injury with Fall? 0 0 0  0  Risk for fall due to : Medication side effect No Fall Risks No Fall Risks    Follow up Falls prevention discussed;Education provided;Falls evaluation completed Falls evaluation completed Falls evaluation completed      FALL RISK  PREVENTION PERTAINING TO THE HOME:  Any stairs in or around the home? No  If so, are there any without handrails? N/a Home free of loose throw rugs in walkways, pet beds, electrical cords, etc? Yes  Adequate lighting in your home to reduce risk of falls? Yes   ASSISTIVE DEVICES UTILIZED TO PREVENT FALLS:  Life alert? No  Use of a cane, walker or w/c? No  Grab bars in the bathroom? No  Shower chair or bench in shower? Yes  Elevated toilet seat or a handicapped toilet? No   TIMED UP AND GO:  Was the test performed? No .      Cognitive Function:    01/05/2019    2:23 PM  MMSE - Mini Mental State Exam  Orientation to time 5  Orientation to Place 5  Registration 3  Attention/ Calculation 2  Recall 3  Language- name 2 objects 2  Language- repeat 1  Language- follow 3 step command 3  Language- read & follow direction 1  Write a sentence 1  Copy design 1  Total score 27        05/24/2022   10:18 AM  6CIT Screen  What Year? 0 points  What month? 0 points  What time? 0 points  Count back from 20 0 points  Months in reverse 0 points  Repeat phrase 2 points  Total Score 2 points    Immunizations Immunization History  Administered Date(s) Administered   Fluad Quad(high Dose 65+) 12/14/2018, 01/31/2019, 12/17/2020, 10/27/2021   Influenza, High Dose Seasonal PF 11/28/2016, 01/22/2018, 12/14/2018, 12/12/2019   Influenza-Unspecified 12/10/2015, 11/11/2016, 11/22/2017   PFIZER(Purple Top)SARS-COV-2 Vaccination 04/22/2019, 05/14/2019, 12/12/2019   Pneumococcal Conjugate-13 06/29/2016   Pneumococcal Polysaccharide-23 12/04/2013, 01/05/2019   Tdap 03/13/2014   Unspecified SARS-COV-2 Vaccination 12/22/2021   Zoster Recombinat (Shingrix) 12/23/2021   Zoster, Live 03/13/2014    TDAP status: Up to date  Flu Vaccine status: Up to date  Pneumococcal vaccine status: Up to date  Covid-19 vaccine status: Completed vaccines  Qualifies for Shingles Vaccine? Yes   Zostavax  completed Yes   Shingrix Completed?: needs second dose  Screening Tests Health Maintenance  Topic Date Due   FOOT EXAM  03/26/2021   Medicare Annual Wellness (AWV)  05/21/2022   OPHTHALMOLOGY EXAM  06/13/2022   HEMOGLOBIN A1C  09/16/2022   INFLUENZA VACCINE  09/23/2022   MAMMOGRAM  11/13/2022   Diabetic kidney evaluation - eGFR measurement  04/08/2023   Diabetic kidney evaluation - Urine ACR  04/23/2023   DTaP/Tdap/Td (2 - Td or Tdap) 03/13/2024   COLONOSCOPY (Pts 45-63yrs Insurance coverage will need to be confirmed)  10/05/2025  Pneumonia Vaccine 92+ Years old  Completed   DEXA SCAN  Completed   Hepatitis C Screening  Completed   HPV VACCINES  Aged Out   COVID-19 Vaccine  Discontinued   Zoster Vaccines- Shingrix  Discontinued    Health Maintenance  Health Maintenance Due  Topic Date Due   FOOT EXAM  03/26/2021   Medicare Annual Wellness (AWV)  05/21/2022    Colorectal cancer screening: Type of screening: Colonoscopy. Completed 10/06/2018. Repeat every 7 years  Mammogram status: Completed 11/12/2021. Repeat every year  Bone Density status: Completed 08/26/2016.   Lung Cancer Screening: (Low Dose CT Chest recommended if Age 22-80 years, 30 pack-year currently smoking OR have quit w/in 15years.) does not qualify.   Lung Cancer Screening Referral: no  Additional Screening:  Hepatitis C Screening: does qualify; Completed 10/01/2015  Vision Screening: Recommended annual ophthalmology exams for early detection of glaucoma and other disorders of the eye. Is the patient up to date with their annual eye exam?  Yes  Who is the provider or what is the name of the office in which the patient attends annual eye exams? [facility] in [place] If pt is not established with a provider, would they like to be referred to a provider to establish care? No .   Dental Screening: Recommended annual dental exams for proper oral hygiene  Community Resource Referral / Chronic Care  Management: CRR required this visit?  No   CCM required this visit?  No      Plan:     I have personally reviewed and noted the following in the patient's chart:   Medical and social history Use of alcohol, tobacco or illicit drugs  Current medications and supplements including opioid prescriptions. Patient is not currently taking opioid prescriptions. Functional ability and status Nutritional status Physical activity Advanced directives List of other physicians Hospitalizations, surgeries, and ER visits in previous 12 months Vitals Screenings to include cognitive, depression, and falls Referrals and appointments  In addition, I have reviewed and discussed with patient certain preventive protocols, quality metrics, and best practice recommendations. A written personalized care plan for preventive services as well as general preventive health recommendations were provided to patient.     Kellie Simmering, LPN   [id number]   Nurse Notes: none  Due to this being a virtual visit, the after visit summary with patients personalized plan was offered to patient via mail or my-chart. Patient would like to access on my-chart

## 2022-05-27 DIAGNOSIS — I10 Essential (primary) hypertension: Secondary | ICD-10-CM | POA: Diagnosis not present

## 2022-05-27 DIAGNOSIS — E782 Mixed hyperlipidemia: Secondary | ICD-10-CM | POA: Diagnosis not present

## 2022-05-27 DIAGNOSIS — E669 Obesity, unspecified: Secondary | ICD-10-CM | POA: Diagnosis not present

## 2022-05-27 DIAGNOSIS — N189 Chronic kidney disease, unspecified: Secondary | ICD-10-CM | POA: Diagnosis not present

## 2022-05-27 DIAGNOSIS — Z6830 Body mass index (BMI) 30.0-30.9, adult: Secondary | ICD-10-CM | POA: Diagnosis not present

## 2022-06-03 ENCOUNTER — Other Ambulatory Visit: Payer: Self-pay | Admitting: Family Medicine

## 2022-06-03 DIAGNOSIS — I10 Essential (primary) hypertension: Secondary | ICD-10-CM

## 2022-06-03 DIAGNOSIS — E119 Type 2 diabetes mellitus without complications: Secondary | ICD-10-CM

## 2022-06-09 ENCOUNTER — Other Ambulatory Visit: Payer: Self-pay | Admitting: Family Medicine

## 2022-06-09 DIAGNOSIS — I10 Essential (primary) hypertension: Secondary | ICD-10-CM

## 2022-06-22 DIAGNOSIS — Q72892 Other reduction defects of left lower limb: Secondary | ICD-10-CM | POA: Diagnosis not present

## 2022-06-22 DIAGNOSIS — M5137 Other intervertebral disc degeneration, lumbosacral region: Secondary | ICD-10-CM | POA: Diagnosis not present

## 2022-06-22 DIAGNOSIS — M9903 Segmental and somatic dysfunction of lumbar region: Secondary | ICD-10-CM | POA: Diagnosis not present

## 2022-06-22 DIAGNOSIS — M5136 Other intervertebral disc degeneration, lumbar region: Secondary | ICD-10-CM | POA: Diagnosis not present

## 2022-06-22 DIAGNOSIS — M9904 Segmental and somatic dysfunction of sacral region: Secondary | ICD-10-CM | POA: Diagnosis not present

## 2022-06-22 DIAGNOSIS — M9905 Segmental and somatic dysfunction of pelvic region: Secondary | ICD-10-CM | POA: Diagnosis not present

## 2022-06-22 DIAGNOSIS — M5432 Sciatica, left side: Secondary | ICD-10-CM | POA: Diagnosis not present

## 2022-06-23 DIAGNOSIS — M9905 Segmental and somatic dysfunction of pelvic region: Secondary | ICD-10-CM | POA: Diagnosis not present

## 2022-06-23 DIAGNOSIS — M5432 Sciatica, left side: Secondary | ICD-10-CM | POA: Diagnosis not present

## 2022-06-23 DIAGNOSIS — M9903 Segmental and somatic dysfunction of lumbar region: Secondary | ICD-10-CM | POA: Diagnosis not present

## 2022-06-23 DIAGNOSIS — M5137 Other intervertebral disc degeneration, lumbosacral region: Secondary | ICD-10-CM | POA: Diagnosis not present

## 2022-06-23 DIAGNOSIS — M9904 Segmental and somatic dysfunction of sacral region: Secondary | ICD-10-CM | POA: Diagnosis not present

## 2022-06-23 DIAGNOSIS — M5136 Other intervertebral disc degeneration, lumbar region: Secondary | ICD-10-CM | POA: Diagnosis not present

## 2022-06-23 DIAGNOSIS — Q72892 Other reduction defects of left lower limb: Secondary | ICD-10-CM | POA: Diagnosis not present

## 2022-06-24 DIAGNOSIS — M5136 Other intervertebral disc degeneration, lumbar region: Secondary | ICD-10-CM | POA: Diagnosis not present

## 2022-06-24 DIAGNOSIS — M9905 Segmental and somatic dysfunction of pelvic region: Secondary | ICD-10-CM | POA: Diagnosis not present

## 2022-06-24 DIAGNOSIS — M9903 Segmental and somatic dysfunction of lumbar region: Secondary | ICD-10-CM | POA: Diagnosis not present

## 2022-06-24 DIAGNOSIS — M9904 Segmental and somatic dysfunction of sacral region: Secondary | ICD-10-CM | POA: Diagnosis not present

## 2022-06-24 DIAGNOSIS — Q72892 Other reduction defects of left lower limb: Secondary | ICD-10-CM | POA: Diagnosis not present

## 2022-06-24 DIAGNOSIS — M5137 Other intervertebral disc degeneration, lumbosacral region: Secondary | ICD-10-CM | POA: Diagnosis not present

## 2022-06-24 DIAGNOSIS — M5432 Sciatica, left side: Secondary | ICD-10-CM | POA: Diagnosis not present

## 2022-06-25 DIAGNOSIS — M9903 Segmental and somatic dysfunction of lumbar region: Secondary | ICD-10-CM | POA: Diagnosis not present

## 2022-06-25 DIAGNOSIS — M9905 Segmental and somatic dysfunction of pelvic region: Secondary | ICD-10-CM | POA: Diagnosis not present

## 2022-06-25 DIAGNOSIS — M5432 Sciatica, left side: Secondary | ICD-10-CM | POA: Diagnosis not present

## 2022-06-25 DIAGNOSIS — M5136 Other intervertebral disc degeneration, lumbar region: Secondary | ICD-10-CM | POA: Diagnosis not present

## 2022-06-25 DIAGNOSIS — Q72892 Other reduction defects of left lower limb: Secondary | ICD-10-CM | POA: Diagnosis not present

## 2022-06-25 DIAGNOSIS — M5137 Other intervertebral disc degeneration, lumbosacral region: Secondary | ICD-10-CM | POA: Diagnosis not present

## 2022-06-25 DIAGNOSIS — M9904 Segmental and somatic dysfunction of sacral region: Secondary | ICD-10-CM | POA: Diagnosis not present

## 2022-06-28 DIAGNOSIS — M9903 Segmental and somatic dysfunction of lumbar region: Secondary | ICD-10-CM | POA: Diagnosis not present

## 2022-06-28 DIAGNOSIS — M9905 Segmental and somatic dysfunction of pelvic region: Secondary | ICD-10-CM | POA: Diagnosis not present

## 2022-06-28 DIAGNOSIS — M5136 Other intervertebral disc degeneration, lumbar region: Secondary | ICD-10-CM | POA: Diagnosis not present

## 2022-06-28 DIAGNOSIS — M5432 Sciatica, left side: Secondary | ICD-10-CM | POA: Diagnosis not present

## 2022-06-28 DIAGNOSIS — M5137 Other intervertebral disc degeneration, lumbosacral region: Secondary | ICD-10-CM | POA: Diagnosis not present

## 2022-06-28 DIAGNOSIS — Q72892 Other reduction defects of left lower limb: Secondary | ICD-10-CM | POA: Diagnosis not present

## 2022-06-28 DIAGNOSIS — M9904 Segmental and somatic dysfunction of sacral region: Secondary | ICD-10-CM | POA: Diagnosis not present

## 2022-06-29 DIAGNOSIS — Q72892 Other reduction defects of left lower limb: Secondary | ICD-10-CM | POA: Diagnosis not present

## 2022-06-29 DIAGNOSIS — M5137 Other intervertebral disc degeneration, lumbosacral region: Secondary | ICD-10-CM | POA: Diagnosis not present

## 2022-06-29 DIAGNOSIS — M9904 Segmental and somatic dysfunction of sacral region: Secondary | ICD-10-CM | POA: Diagnosis not present

## 2022-06-29 DIAGNOSIS — M5432 Sciatica, left side: Secondary | ICD-10-CM | POA: Diagnosis not present

## 2022-06-29 DIAGNOSIS — M5136 Other intervertebral disc degeneration, lumbar region: Secondary | ICD-10-CM | POA: Diagnosis not present

## 2022-06-29 DIAGNOSIS — M9903 Segmental and somatic dysfunction of lumbar region: Secondary | ICD-10-CM | POA: Diagnosis not present

## 2022-06-29 DIAGNOSIS — M9905 Segmental and somatic dysfunction of pelvic region: Secondary | ICD-10-CM | POA: Diagnosis not present

## 2022-07-01 DIAGNOSIS — M9905 Segmental and somatic dysfunction of pelvic region: Secondary | ICD-10-CM | POA: Diagnosis not present

## 2022-07-01 DIAGNOSIS — M5432 Sciatica, left side: Secondary | ICD-10-CM | POA: Diagnosis not present

## 2022-07-01 DIAGNOSIS — Q72892 Other reduction defects of left lower limb: Secondary | ICD-10-CM | POA: Diagnosis not present

## 2022-07-01 DIAGNOSIS — M9904 Segmental and somatic dysfunction of sacral region: Secondary | ICD-10-CM | POA: Diagnosis not present

## 2022-07-01 DIAGNOSIS — M9903 Segmental and somatic dysfunction of lumbar region: Secondary | ICD-10-CM | POA: Diagnosis not present

## 2022-07-01 DIAGNOSIS — M5136 Other intervertebral disc degeneration, lumbar region: Secondary | ICD-10-CM | POA: Diagnosis not present

## 2022-07-01 DIAGNOSIS — M5137 Other intervertebral disc degeneration, lumbosacral region: Secondary | ICD-10-CM | POA: Diagnosis not present

## 2022-07-03 ENCOUNTER — Other Ambulatory Visit: Payer: Self-pay | Admitting: Family Medicine

## 2022-07-03 DIAGNOSIS — E119 Type 2 diabetes mellitus without complications: Secondary | ICD-10-CM

## 2022-07-03 DIAGNOSIS — I10 Essential (primary) hypertension: Secondary | ICD-10-CM

## 2022-07-04 ENCOUNTER — Other Ambulatory Visit: Payer: Self-pay | Admitting: Family Medicine

## 2022-07-04 DIAGNOSIS — E039 Hypothyroidism, unspecified: Secondary | ICD-10-CM

## 2022-07-05 DIAGNOSIS — M9905 Segmental and somatic dysfunction of pelvic region: Secondary | ICD-10-CM | POA: Diagnosis not present

## 2022-07-05 DIAGNOSIS — M5136 Other intervertebral disc degeneration, lumbar region: Secondary | ICD-10-CM | POA: Diagnosis not present

## 2022-07-05 DIAGNOSIS — M5137 Other intervertebral disc degeneration, lumbosacral region: Secondary | ICD-10-CM | POA: Diagnosis not present

## 2022-07-05 DIAGNOSIS — M5432 Sciatica, left side: Secondary | ICD-10-CM | POA: Diagnosis not present

## 2022-07-05 DIAGNOSIS — M9903 Segmental and somatic dysfunction of lumbar region: Secondary | ICD-10-CM | POA: Diagnosis not present

## 2022-07-05 DIAGNOSIS — Q72892 Other reduction defects of left lower limb: Secondary | ICD-10-CM | POA: Diagnosis not present

## 2022-07-05 DIAGNOSIS — M9904 Segmental and somatic dysfunction of sacral region: Secondary | ICD-10-CM | POA: Diagnosis not present

## 2022-07-06 DIAGNOSIS — M5432 Sciatica, left side: Secondary | ICD-10-CM | POA: Diagnosis not present

## 2022-07-06 DIAGNOSIS — M9905 Segmental and somatic dysfunction of pelvic region: Secondary | ICD-10-CM | POA: Diagnosis not present

## 2022-07-06 DIAGNOSIS — M9903 Segmental and somatic dysfunction of lumbar region: Secondary | ICD-10-CM | POA: Diagnosis not present

## 2022-07-06 DIAGNOSIS — M9904 Segmental and somatic dysfunction of sacral region: Secondary | ICD-10-CM | POA: Diagnosis not present

## 2022-07-06 DIAGNOSIS — M5136 Other intervertebral disc degeneration, lumbar region: Secondary | ICD-10-CM | POA: Diagnosis not present

## 2022-07-06 DIAGNOSIS — Q72892 Other reduction defects of left lower limb: Secondary | ICD-10-CM | POA: Diagnosis not present

## 2022-07-06 DIAGNOSIS — M5137 Other intervertebral disc degeneration, lumbosacral region: Secondary | ICD-10-CM | POA: Diagnosis not present

## 2022-07-08 DIAGNOSIS — M9905 Segmental and somatic dysfunction of pelvic region: Secondary | ICD-10-CM | POA: Diagnosis not present

## 2022-07-08 DIAGNOSIS — M5136 Other intervertebral disc degeneration, lumbar region: Secondary | ICD-10-CM | POA: Diagnosis not present

## 2022-07-08 DIAGNOSIS — M5137 Other intervertebral disc degeneration, lumbosacral region: Secondary | ICD-10-CM | POA: Diagnosis not present

## 2022-07-08 DIAGNOSIS — M5432 Sciatica, left side: Secondary | ICD-10-CM | POA: Diagnosis not present

## 2022-07-08 DIAGNOSIS — M9904 Segmental and somatic dysfunction of sacral region: Secondary | ICD-10-CM | POA: Diagnosis not present

## 2022-07-08 DIAGNOSIS — M9903 Segmental and somatic dysfunction of lumbar region: Secondary | ICD-10-CM | POA: Diagnosis not present

## 2022-07-08 DIAGNOSIS — Q72892 Other reduction defects of left lower limb: Secondary | ICD-10-CM | POA: Diagnosis not present

## 2022-07-20 DIAGNOSIS — M5136 Other intervertebral disc degeneration, lumbar region: Secondary | ICD-10-CM | POA: Diagnosis not present

## 2022-07-20 DIAGNOSIS — M5432 Sciatica, left side: Secondary | ICD-10-CM | POA: Diagnosis not present

## 2022-07-20 DIAGNOSIS — Q72892 Other reduction defects of left lower limb: Secondary | ICD-10-CM | POA: Diagnosis not present

## 2022-07-20 DIAGNOSIS — M9904 Segmental and somatic dysfunction of sacral region: Secondary | ICD-10-CM | POA: Diagnosis not present

## 2022-07-20 DIAGNOSIS — M9903 Segmental and somatic dysfunction of lumbar region: Secondary | ICD-10-CM | POA: Diagnosis not present

## 2022-07-20 DIAGNOSIS — M9905 Segmental and somatic dysfunction of pelvic region: Secondary | ICD-10-CM | POA: Diagnosis not present

## 2022-07-20 DIAGNOSIS — M5137 Other intervertebral disc degeneration, lumbosacral region: Secondary | ICD-10-CM | POA: Diagnosis not present

## 2022-07-21 DIAGNOSIS — M5136 Other intervertebral disc degeneration, lumbar region: Secondary | ICD-10-CM | POA: Diagnosis not present

## 2022-07-21 DIAGNOSIS — Q72892 Other reduction defects of left lower limb: Secondary | ICD-10-CM | POA: Diagnosis not present

## 2022-07-21 DIAGNOSIS — M5137 Other intervertebral disc degeneration, lumbosacral region: Secondary | ICD-10-CM | POA: Diagnosis not present

## 2022-07-21 DIAGNOSIS — M9904 Segmental and somatic dysfunction of sacral region: Secondary | ICD-10-CM | POA: Diagnosis not present

## 2022-07-21 DIAGNOSIS — M5432 Sciatica, left side: Secondary | ICD-10-CM | POA: Diagnosis not present

## 2022-07-21 DIAGNOSIS — M9905 Segmental and somatic dysfunction of pelvic region: Secondary | ICD-10-CM | POA: Diagnosis not present

## 2022-07-21 DIAGNOSIS — M9903 Segmental and somatic dysfunction of lumbar region: Secondary | ICD-10-CM | POA: Diagnosis not present

## 2022-07-22 ENCOUNTER — Other Ambulatory Visit: Payer: Self-pay | Admitting: Family Medicine

## 2022-07-22 DIAGNOSIS — Q72892 Other reduction defects of left lower limb: Secondary | ICD-10-CM | POA: Diagnosis not present

## 2022-07-22 DIAGNOSIS — M9903 Segmental and somatic dysfunction of lumbar region: Secondary | ICD-10-CM | POA: Diagnosis not present

## 2022-07-22 DIAGNOSIS — M9904 Segmental and somatic dysfunction of sacral region: Secondary | ICD-10-CM | POA: Diagnosis not present

## 2022-07-22 DIAGNOSIS — M5136 Other intervertebral disc degeneration, lumbar region: Secondary | ICD-10-CM

## 2022-07-22 DIAGNOSIS — M9905 Segmental and somatic dysfunction of pelvic region: Secondary | ICD-10-CM | POA: Diagnosis not present

## 2022-07-22 DIAGNOSIS — M5432 Sciatica, left side: Secondary | ICD-10-CM | POA: Diagnosis not present

## 2022-07-22 DIAGNOSIS — M5137 Other intervertebral disc degeneration, lumbosacral region: Secondary | ICD-10-CM | POA: Diagnosis not present

## 2022-07-23 ENCOUNTER — Other Ambulatory Visit: Payer: Self-pay | Admitting: Family Medicine

## 2022-07-23 DIAGNOSIS — E119 Type 2 diabetes mellitus without complications: Secondary | ICD-10-CM

## 2022-07-23 DIAGNOSIS — I10 Essential (primary) hypertension: Secondary | ICD-10-CM

## 2022-07-23 DIAGNOSIS — E039 Hypothyroidism, unspecified: Secondary | ICD-10-CM

## 2022-07-23 NOTE — Telephone Encounter (Signed)
Pt needs her Levothyroxine refilled she lost her bottle when in vacation. The pharmacy will not refill without Dr Ma Hillock approval.

## 2022-07-26 MED ORDER — LEVOTHYROXINE SODIUM 75 MCG PO TABS: 75.0000 ug | ORAL_TABLET | Freq: Every day | ORAL | 0 refills | Status: DC

## 2022-07-26 NOTE — Telephone Encounter (Signed)
Please advise message below per patient she have missed placed pending Rx while on vacation. I called the pharmacy they will fill with a new prescription and patient may have to pay $15-20 co-pay. Is it okay to refill?

## 2022-07-27 DIAGNOSIS — M9903 Segmental and somatic dysfunction of lumbar region: Secondary | ICD-10-CM | POA: Diagnosis not present

## 2022-07-27 DIAGNOSIS — M5432 Sciatica, left side: Secondary | ICD-10-CM | POA: Diagnosis not present

## 2022-07-27 DIAGNOSIS — M9905 Segmental and somatic dysfunction of pelvic region: Secondary | ICD-10-CM | POA: Diagnosis not present

## 2022-07-27 DIAGNOSIS — M5137 Other intervertebral disc degeneration, lumbosacral region: Secondary | ICD-10-CM | POA: Diagnosis not present

## 2022-07-27 DIAGNOSIS — Q72892 Other reduction defects of left lower limb: Secondary | ICD-10-CM | POA: Diagnosis not present

## 2022-07-27 DIAGNOSIS — M9904 Segmental and somatic dysfunction of sacral region: Secondary | ICD-10-CM | POA: Diagnosis not present

## 2022-07-27 DIAGNOSIS — M5136 Other intervertebral disc degeneration, lumbar region: Secondary | ICD-10-CM | POA: Diagnosis not present

## 2022-07-28 DIAGNOSIS — M9904 Segmental and somatic dysfunction of sacral region: Secondary | ICD-10-CM | POA: Diagnosis not present

## 2022-07-28 DIAGNOSIS — Q72892 Other reduction defects of left lower limb: Secondary | ICD-10-CM | POA: Diagnosis not present

## 2022-07-28 DIAGNOSIS — M9903 Segmental and somatic dysfunction of lumbar region: Secondary | ICD-10-CM | POA: Diagnosis not present

## 2022-07-28 DIAGNOSIS — M5432 Sciatica, left side: Secondary | ICD-10-CM | POA: Diagnosis not present

## 2022-07-28 DIAGNOSIS — M5137 Other intervertebral disc degeneration, lumbosacral region: Secondary | ICD-10-CM | POA: Diagnosis not present

## 2022-07-28 DIAGNOSIS — M9905 Segmental and somatic dysfunction of pelvic region: Secondary | ICD-10-CM | POA: Diagnosis not present

## 2022-07-28 DIAGNOSIS — M5136 Other intervertebral disc degeneration, lumbar region: Secondary | ICD-10-CM | POA: Diagnosis not present

## 2022-07-29 ENCOUNTER — Ambulatory Visit: Payer: Medicare Other | Admitting: Family Medicine

## 2022-07-30 ENCOUNTER — Encounter: Payer: Self-pay | Admitting: Family Medicine

## 2022-07-30 ENCOUNTER — Ambulatory Visit (INDEPENDENT_AMBULATORY_CARE_PROVIDER_SITE_OTHER): Payer: Medicare Other | Admitting: Family Medicine

## 2022-07-30 ENCOUNTER — Telehealth: Payer: Self-pay

## 2022-07-30 VITALS — BP 128/72 | HR 72 | Temp 97.7°F | Wt 171.0 lb

## 2022-07-30 DIAGNOSIS — E039 Hypothyroidism, unspecified: Secondary | ICD-10-CM

## 2022-07-30 DIAGNOSIS — R0989 Other specified symptoms and signs involving the circulatory and respiratory systems: Secondary | ICD-10-CM | POA: Diagnosis not present

## 2022-07-30 DIAGNOSIS — E119 Type 2 diabetes mellitus without complications: Secondary | ICD-10-CM | POA: Diagnosis not present

## 2022-07-30 DIAGNOSIS — N1831 Chronic kidney disease, stage 3a: Secondary | ICD-10-CM | POA: Diagnosis not present

## 2022-07-30 LAB — BASIC METABOLIC PANEL
BUN: 28 mg/dL — ABNORMAL HIGH (ref 6–23)
CO2: 28 mEq/L (ref 19–32)
Calcium: 9.7 mg/dL (ref 8.4–10.5)
Chloride: 105 mEq/L (ref 96–112)
Creatinine, Ser: 1.38 mg/dL — ABNORMAL HIGH (ref 0.40–1.20)
GFR: 38.58 mL/min — ABNORMAL LOW (ref >60.00)
Glucose, Bld: 103 mg/dL — ABNORMAL HIGH (ref 70–99)
Potassium: 3.8 mEq/L (ref 3.5–5.1)
Sodium: 144 mEq/L (ref 135–145)

## 2022-07-30 LAB — TSH: TSH: 4.96 u[IU]/mL (ref 0.35–5.50)

## 2022-07-30 LAB — HEMOGLOBIN A1C: Hgb A1c MFr Bld: 6.1 % (ref 4.6–6.5)

## 2022-07-30 NOTE — Patient Instructions (Signed)
Visit Information  Thank you for taking time to visit with me today. Please don't hesitate to contact me if I can be of assistance to you.   Following are the goals we discussed today:   Goals Addressed             This Visit's Progress    COMPLETED: Care Coordination Activities-No follow up required       Care Coordination Interventions: Discussed [facility] services and support. Assessed SDOH. Advised to discuss with primary care physician if services needed in the future.         If you are experiencing a Mental Health or Behavioral Health Crisis or need someone to talk to, please call the Suicide and Crisis Lifeline: 988   Patient verbalizes understanding of instructions and care plan provided today and agrees to view in MyChart. Active MyChart status and patient understanding of how to access instructions and care plan via MyChart confirmed with patient.     The patient has been provided with contact information for the care management team and has been advised to call with any health related questions or concerns.   Rockland Kotarski J Miyo Aina, RN, MSN [facility] Care Management Care Management Coordinator Direct Line [phone]     

## 2022-07-30 NOTE — Patient Outreach (Signed)
  Care Coordination   In Person Provider Office Visit Note   07/30/2022 Name: Ashley Woodard MRN: [id number] DOB: 03-Jan-1951  Ashley Woodard is a 72 y.o. year old female who sees Mliss Sax, MD for primary care. I engaged with Ashley Woodard in the providers office today.  What matters to the patients health and wellness today?  none    Goals Addressed             This Visit's Progress    COMPLETED: Care Coordination Activities-No follow up required       Care Coordination Interventions: Discussed [facility] services and support. Assessed SDOH. Advised to discuss with primary care physician if services needed in the future.        SDOH assessments and interventions completed:  Yes  SDOH Interventions Today    Flowsheet Row Most Recent Value  SDOH Interventions   Utilities Interventions Intervention Not Indicated        Care Coordination Interventions:  Yes, provided   Follow up plan: No further intervention required.   Encounter Outcome:  Pt. Visit Completed   Bary Leriche, RN, MSN [facility] Care Management Care Management Coordinator Direct Line [phone]

## 2022-07-30 NOTE — Progress Notes (Addendum)
Established Patient Office Visit   Subjective:  Patient ID: Ashley Woodard, female    DOB: 1950-04-09  Age: 72 y.o. MRN: [id number]  Chief Complaint  Patient presents with   Follow-up    HPI Encounter Diagnoses  Name Primary?   Stage 3a chronic kidney disease (HCC) Yes   Type 2 diabetes mellitus without complication, without long-term current use of insulin (HCC)    Hypothyroidism, unspecified type    Diminished pulse    Doing well.  Will finish therapy with Plavix in a few weeks and then switch to an aspirin.  Continues Glucophage 500 twice daily for well-controlled diabetes.  Continues to thyroxine 75 mcg hypothyroidism.   Review of Systems  Constitutional: Negative.   HENT: Negative.    Eyes:  Negative for blurred vision, discharge and redness.  Respiratory: Negative.    Cardiovascular: Negative.   Gastrointestinal:  Negative for abdominal pain.  Genitourinary: Negative.   Musculoskeletal: Negative.  Negative for myalgias.  Skin:  Negative for rash.  Neurological:  Negative for tingling, loss of consciousness and weakness.  Endo/Heme/Allergies:  Negative for polydipsia.     Current Outpatient Medications:    amLODipine (NORVASC) 10 MG tablet, TAKE 1 TABLET BY MOUTH EVERY DAY, Disp: 90 tablet, Rfl: 1   aspirin EC ([facility] ASPIRIN LOW DOSE) 81 MG tablet, TAKE 1 TABLET (81 MG TOTAL) BY MOUTH DAILY. SWALLOW WHOLE., Disp: 90 tablet, Rfl: 3   atorvastatin (LIPITOR) 10 MG tablet, TAKE 1 TABLET BY MOUTH EVERY DAY, Disp: 90 tablet, Rfl: 3   Blood Glucose Calibration (ONETOUCH VERIO) SOLN, 1 Act by In Vitro route in the morning and at bedtime., Disp: 1 each, Rfl: 5   Blood Glucose Monitoring Suppl (ONETOUCH VERIO FLEX SYSTEM) w/Device KIT, 1 Act by Does not apply route in the morning and at bedtime., Disp: 1 kit, Rfl: 3   clopidogrel (PLAVIX) 75 MG tablet, Take 1 tablet (75 mg total) by mouth daily. START ON 2/26 AFTER STOPPING COUMADIN ON 2/25, Disp: 90 tablet, Rfl: 3    diphenhydrAMINE HCl, Sleep, (SLEEP-AID) 50 MG CAPS, Take 50 mg by mouth at bedtime., Disp: , Rfl:    DULoxetine (CYMBALTA) 60 MG capsule, TAKE 1 CAPSULE BY MOUTH EVERY DAY, Disp: 90 capsule, Rfl: 3   fluticasone (FLONASE) 50 MCG/ACT nasal spray, Place 1 spray into both nostrils daily. Begin by using 2 sprays in each nare daily for 3 to 5 days, then decrease to 1 spray in each nare daily., Disp: 31.6 mL, Rfl: 1   glucose blood (ONETOUCH VERIO) test strip, 1 each by Other route in the morning and at bedtime. Use as instructed, Disp: 100 each, Rfl: 12   hydrALAZINE (APRESOLINE) 50 MG tablet, TAKE 1 TABLET BY MOUTH TWICE A DAY, Disp: 180 tablet, Rfl: 0   irbesartan (AVAPRO) 300 MG tablet, TAKE 1 TABLET BY MOUTH EVERY DAY, Disp: 90 tablet, Rfl: 0   Lancets (ONETOUCH ULTRASOFT) lancets, 1 each by Other route in the morning and at bedtime. Use as instructed, Disp: 100 each, Rfl: 5   levothyroxine (SYNTHROID) 75 MCG tablet, Take 1 tablet (75 mcg total) by mouth daily., Disp: 90 tablet, Rfl: 0   metFORMIN (GLUCOPHAGE) 500 MG tablet, TAKE 1 TABLET BY MOUTH TWICE A DAY WITH FOOD, Disp: 180 tablet, Rfl: 0   torsemide (DEMADEX) 20 MG tablet, Take 1 tablet (20 mg total) by mouth daily., Disp: 90 tablet, Rfl: 0  Current Facility-Administered Medications:    botulinum toxin Type A (BOTOX) injection 100  Units, 100 Units, Intramuscular, Once, Levert Feinstein, MD   Objective:     BP 128/72   Pulse 72   Temp 97.7 F (36.5 C)   Wt 171 lb (77.6 kg)   BMI 26.78 kg/m    Physical Exam Constitutional:      General: She is not in acute distress.    Appearance: Normal appearance. She is not ill-appearing, toxic-appearing or diaphoretic.  HENT:     Head: Normocephalic and atraumatic.     Right Ear: External ear normal.     Left Ear: External ear normal.  Eyes:     General: No scleral icterus.       Right eye: No discharge.        Left eye: No discharge.     Extraocular Movements: Extraocular movements intact.      Conjunctiva/sclera: Conjunctivae normal.  Cardiovascular:     Rate and Rhythm: Normal rate and regular rhythm.     Pulses:          Dorsalis pedis pulses are 0 on the right side and 0 on the left side.       Posterior tibial pulses are 0 on the right side and 0 on the left side.     Comments: Capillary refill in toes is sluggish. Pulmonary:     Effort: Pulmonary effort is normal. No respiratory distress.     Breath sounds: Normal breath sounds.  Musculoskeletal:     Cervical back: Normal range of motion and neck supple. No rigidity or tenderness.  Lymphadenopathy:     Cervical: No cervical adenopathy.  Skin:    General: Skin is warm and dry.  Neurological:     Mental Status: She is alert and oriented to person, place, and time.  Psychiatric:        Mood and Affect: Mood normal.        Behavior: Behavior normal.      Results for orders placed or performed in visit on 07/30/22  Basic metabolic panel  Result Value Ref Range   Sodium 144 135 - 145 mEq/L   Potassium 3.8 3.5 - 5.1 mEq/L   Chloride 105 96 - 112 mEq/L   CO2 28 19 - 32 mEq/L   Glucose, Bld 103 (H) 70 - 99 mg/dL   BUN 28 (H) 6 - 23 mg/dL   Creatinine, Ser 4.09 (H) 0.40 - 1.20 mg/dL   GFR 81.19 (L) >14.78 mL/min   Calcium 9.7 8.4 - 10.5 mg/dL  Hemoglobin G9F  Result Value Ref Range   Hgb A1c MFr Bld 6.1 4.6 - 6.5 %  TSH  Result Value Ref Range   TSH 4.96 0.35 - 5.50 uIU/mL      The ASCVD Risk score (Arnett DK, et al., 2019) failed to calculate for the following reasons:   The valid total cholesterol range is 130 to 320 mg/dL    Assessment & Plan:   Stage 3a chronic kidney disease (HCC) -     Basic metabolic panel  Type 2 diabetes mellitus without complication, without long-term current use of insulin (HCC) -     Basic metabolic panel -     Hemoglobin A1c  Hypothyroidism, unspecified type -     TSH  Diminished pulse -     VAS [place] ABI WITH/WO TBI; Future    Return in about 3 months (around  10/30/2022), or if symptoms worsen or fail to improve.  Patient will schedule an appointment with her ophthalmologist.  After I found  diminished pulses in her feet she did admit to pain in her calf sometimes with walking.  Will check arterial Dopplers.  Assuming stable labs follow-up and 6 months.  Or sooner if needed for abnormal ABIs.  Mliss Sax, MD

## 2022-08-03 DIAGNOSIS — M5137 Other intervertebral disc degeneration, lumbosacral region: Secondary | ICD-10-CM | POA: Diagnosis not present

## 2022-08-03 DIAGNOSIS — M9904 Segmental and somatic dysfunction of sacral region: Secondary | ICD-10-CM | POA: Diagnosis not present

## 2022-08-03 DIAGNOSIS — Q72892 Other reduction defects of left lower limb: Secondary | ICD-10-CM | POA: Diagnosis not present

## 2022-08-03 DIAGNOSIS — M9903 Segmental and somatic dysfunction of lumbar region: Secondary | ICD-10-CM | POA: Diagnosis not present

## 2022-08-03 DIAGNOSIS — M5432 Sciatica, left side: Secondary | ICD-10-CM | POA: Diagnosis not present

## 2022-08-03 DIAGNOSIS — M9905 Segmental and somatic dysfunction of pelvic region: Secondary | ICD-10-CM | POA: Diagnosis not present

## 2022-08-03 DIAGNOSIS — M5136 Other intervertebral disc degeneration, lumbar region: Secondary | ICD-10-CM | POA: Diagnosis not present

## 2022-08-05 DIAGNOSIS — M5136 Other intervertebral disc degeneration, lumbar region: Secondary | ICD-10-CM | POA: Diagnosis not present

## 2022-08-05 DIAGNOSIS — M5432 Sciatica, left side: Secondary | ICD-10-CM | POA: Diagnosis not present

## 2022-08-05 DIAGNOSIS — M5137 Other intervertebral disc degeneration, lumbosacral region: Secondary | ICD-10-CM | POA: Diagnosis not present

## 2022-08-05 DIAGNOSIS — M9903 Segmental and somatic dysfunction of lumbar region: Secondary | ICD-10-CM | POA: Diagnosis not present

## 2022-08-05 DIAGNOSIS — Q72892 Other reduction defects of left lower limb: Secondary | ICD-10-CM | POA: Diagnosis not present

## 2022-08-05 DIAGNOSIS — M9905 Segmental and somatic dysfunction of pelvic region: Secondary | ICD-10-CM | POA: Diagnosis not present

## 2022-08-05 DIAGNOSIS — M9904 Segmental and somatic dysfunction of sacral region: Secondary | ICD-10-CM | POA: Diagnosis not present

## 2022-08-10 DIAGNOSIS — M9905 Segmental and somatic dysfunction of pelvic region: Secondary | ICD-10-CM | POA: Diagnosis not present

## 2022-08-10 DIAGNOSIS — Q72892 Other reduction defects of left lower limb: Secondary | ICD-10-CM | POA: Diagnosis not present

## 2022-08-10 DIAGNOSIS — M9903 Segmental and somatic dysfunction of lumbar region: Secondary | ICD-10-CM | POA: Diagnosis not present

## 2022-08-10 DIAGNOSIS — M9904 Segmental and somatic dysfunction of sacral region: Secondary | ICD-10-CM | POA: Diagnosis not present

## 2022-08-10 DIAGNOSIS — M5137 Other intervertebral disc degeneration, lumbosacral region: Secondary | ICD-10-CM | POA: Diagnosis not present

## 2022-08-10 DIAGNOSIS — M5432 Sciatica, left side: Secondary | ICD-10-CM | POA: Diagnosis not present

## 2022-08-10 DIAGNOSIS — M5136 Other intervertebral disc degeneration, lumbar region: Secondary | ICD-10-CM | POA: Diagnosis not present

## 2022-08-16 DIAGNOSIS — E1122 Type 2 diabetes mellitus with diabetic chronic kidney disease: Secondary | ICD-10-CM | POA: Diagnosis not present

## 2022-08-16 DIAGNOSIS — N183 Chronic kidney disease, stage 3 unspecified: Secondary | ICD-10-CM | POA: Diagnosis not present

## 2022-08-16 DIAGNOSIS — I10 Essential (primary) hypertension: Secondary | ICD-10-CM | POA: Diagnosis not present

## 2022-08-16 DIAGNOSIS — E663 Overweight: Secondary | ICD-10-CM | POA: Diagnosis not present

## 2022-08-17 ENCOUNTER — Encounter: Payer: Self-pay | Admitting: Family Medicine

## 2022-08-17 DIAGNOSIS — Q72892 Other reduction defects of left lower limb: Secondary | ICD-10-CM | POA: Diagnosis not present

## 2022-08-17 DIAGNOSIS — M5136 Other intervertebral disc degeneration, lumbar region: Secondary | ICD-10-CM | POA: Diagnosis not present

## 2022-08-17 DIAGNOSIS — M5432 Sciatica, left side: Secondary | ICD-10-CM | POA: Diagnosis not present

## 2022-08-17 DIAGNOSIS — M9903 Segmental and somatic dysfunction of lumbar region: Secondary | ICD-10-CM | POA: Diagnosis not present

## 2022-08-17 DIAGNOSIS — M9905 Segmental and somatic dysfunction of pelvic region: Secondary | ICD-10-CM | POA: Diagnosis not present

## 2022-08-17 DIAGNOSIS — M5137 Other intervertebral disc degeneration, lumbosacral region: Secondary | ICD-10-CM | POA: Diagnosis not present

## 2022-08-17 DIAGNOSIS — M9904 Segmental and somatic dysfunction of sacral region: Secondary | ICD-10-CM | POA: Diagnosis not present

## 2022-08-18 ENCOUNTER — Other Ambulatory Visit: Payer: Self-pay | Admitting: Family

## 2022-08-18 ENCOUNTER — Encounter: Payer: Self-pay | Admitting: Family Medicine

## 2022-08-18 DIAGNOSIS — E119 Type 2 diabetes mellitus without complications: Secondary | ICD-10-CM

## 2022-08-18 DIAGNOSIS — N1831 Chronic kidney disease, stage 3a: Secondary | ICD-10-CM

## 2022-08-18 DIAGNOSIS — R0989 Other specified symptoms and signs involving the circulatory and respiratory systems: Secondary | ICD-10-CM

## 2022-08-19 ENCOUNTER — Telehealth: Payer: Self-pay | Admitting: Family Medicine

## 2022-08-19 DIAGNOSIS — M5432 Sciatica, left side: Secondary | ICD-10-CM | POA: Diagnosis not present

## 2022-08-19 DIAGNOSIS — M5137 Other intervertebral disc degeneration, lumbosacral region: Secondary | ICD-10-CM | POA: Diagnosis not present

## 2022-08-19 DIAGNOSIS — M9904 Segmental and somatic dysfunction of sacral region: Secondary | ICD-10-CM | POA: Diagnosis not present

## 2022-08-19 DIAGNOSIS — M5136 Other intervertebral disc degeneration, lumbar region: Secondary | ICD-10-CM | POA: Diagnosis not present

## 2022-08-19 DIAGNOSIS — M9903 Segmental and somatic dysfunction of lumbar region: Secondary | ICD-10-CM | POA: Diagnosis not present

## 2022-08-19 DIAGNOSIS — M9905 Segmental and somatic dysfunction of pelvic region: Secondary | ICD-10-CM | POA: Diagnosis not present

## 2022-08-19 DIAGNOSIS — Q72892 Other reduction defects of left lower limb: Secondary | ICD-10-CM | POA: Diagnosis not present

## 2022-08-19 NOTE — Telephone Encounter (Signed)
Ashley [facility] [phone]  Needs to know if there is a PA for the procedure that was ordered. She said she thought it was a ABI

## 2022-08-20 MED ORDER — EMPAGLIFLOZIN 10 MG PO TABS: 10.0000 mg | ORAL_TABLET | Freq: Every day | ORAL | 5 refills | Status: DC

## 2022-08-20 NOTE — Telephone Encounter (Signed)
Hello Elnita Maxwell are you able to help with the requested PA below or see if PA is needed. I called and spoke with Dorthy she was calling us to see if patient needed a PA before she schedules the appointment. Please help

## 2022-08-31 ENCOUNTER — Other Ambulatory Visit: Payer: Self-pay | Admitting: Family Medicine

## 2022-08-31 DIAGNOSIS — E119 Type 2 diabetes mellitus without complications: Secondary | ICD-10-CM

## 2022-08-31 DIAGNOSIS — I10 Essential (primary) hypertension: Secondary | ICD-10-CM

## 2022-08-31 NOTE — Progress Notes (Unsigned)
[facility]                                     Cardiology Office Note:    Date:  08/31/2022   ID:  Ashley Woodard, DOB 08-Oct-1950, MRN [id number]  PCP:  Mliss Sax, MD  [facility] Cardiologist:  Lewayne Bunting, MD  [facility] Electrophysiologist:  Lanier Prude, MD   Referring MD: Mliss Sax   No chief complaint on file. ***  History of Present Illness:    Ashley Woodard is a 72 y.o. female with a hx of paroxysmal atrial fibrillation on Coumadin s/p AF ablation 2019, HTN, and DM2. She has been followed by Dr. Ladona Ridgel for her AF and was referred to Dr. Lalla Brothers for the evaluation of LAAO placement due to frequent falls and poor medication compliance.     Ashley Woodard was seen as an outpatient and thought to be a poor candidate for long term anticoagulation and underwent LAAO closure with a 27mm Watchman device. She was restarted on Coumadin and ASA was added with plans to transition to DAPT with ASA and Plavix at the 45 days mark and continue through 6 months, then ASA monotherapy. Plan for CT scan after 60 days to reassess Watchman position.   She is here today and reports that she has been very well since LAAO closure with no chest pain, palpitations, LE edema, SOB, dizziness, bleeding, or syncope.   PAF: s/p LAAO closure with Watchman. She will continue ASA and Coumadin until 04/18/22, at that time she will stop Coumadin and start Plavix 75mg  QD on 04/19/22 through 6 months. She will then stop Plavix and continue ASA monotherapy indefinitely. Plan for post procedure CT 3/15. Ct instructions reviewed. Obtain BMET today. I will contact [facility] to inform them that she will no longer need INR checks. Plan 6 month follow up. She will need dental SBE through 6 months with Amoxicillin.    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 She is asymptomatic at this time.    HTN: Stable with no changes today     Past Medical History:   Diagnosis Date   Abnormal cardiovascular stress test 08/21/2016   a. done for abnl EKG/cardiac risk factors -> admitted for cath 07/2016 which showed no significant CAD, normal LV contraction, mildly elevated filling pressure. Low dose Imdur was added for possible component of microvascular dysfunction.    Allergy    Anxiety    Arthritis    "back, knees, fingers" (05/10/2017)   Chronic lower back pain    Colon polyps    Deafness in right ear    Depression    Diverticulosis    Hx of adenomatous colonic polyps 05/08/2003   Hypertension    Hypertriglyceridemia    Hypothyroidism    Migraine    "none in the 2000s" (05/10/2017)   Osteoporosis    Paroxysmal atrial fibrillation (HCC)    Pneumonia    "several times" (05/10/2017)   PONV (postoperative nausea and vomiting)    Presence of Watchman left atrial appendage closure device 03/04/2022   s/p LAAO with a 31 mm Watchman FLX by Dr. Lalla Brothers   S/P cardiac catheterization, 08/20/16 minimal CAD 08/21/2016   Type II diabetes mellitus (HCC)    Voice tremor     Past Surgical History:  Procedure Laterality Date   ABDOMINAL HYSTERECTOMY  1988  ATRIAL FIBRILLATION ABLATION N/A 05/10/2017   Procedure: ATRIAL FIBRILLATION ABLATION;  Surgeon: Hillis Range, MD;  Location: [facility] INVASIVE CV LAB;  Service: Cardiovascular;  Laterality: N/A;   COCHLEAR IMPLANT Right 2008   COLONOSCOPY     LEFT ATRIAL APPENDAGE OCCLUSION N/A 03/04/2022   Procedure: LEFT ATRIAL APPENDAGE OCCLUSION;  Surgeon: Lanier Prude, MD;  Location: [facility] INVAS[facility] CV [facility];  Service: Cardiovascular;  Laterality: N/A;   LEFT HEART CATH AND CORONARY ANGIOGRAPHY N/A 08/20/2016   Procedure: Left Heart Cath and Coronary Angiography;  Surgeon: Yvonne Kendall, MD;  Location: [facility] INVASIVE CV [facility];  Service: Cardiovascular;  Laterality: N/A;   LOOP RECORDER INSERTION N/A 11/29/2017   Procedure: LOOP RECORDER INSERTION;  Surgeon: Hillis Range, MD;  Location: [facility];  Service:  Cardiovascular;  Laterality: N/A;   ORIF ANKLE FRACTURE Right 02/06/2019   Procedure: OPEN REDUCTION INTERNAL FIXATION (ORIF) ANKLE FRACTURE LATERAL MALLEOLUS, SYNDESMOSIS, RIGHT;  Surgeon: Terance Hart, MD;  Location:  SURGERY CENTER;  Service: Orthopedics;  Laterality: Right;  SURGERY REQUEST TIME: 1.5 HOURS  CPT CODES: 16109, 27829, 60454, 09811   TEE WITHOUT CARDIOVERSION N/A 03/04/2022   Procedure: TRANSESOPHAGEAL ECHOCARDIOGRAM (TEE);  Surgeon: Lanier Prude, MD;  Location: [facility] INVASIVE CV LAB;  Service: Cardiovascular;  Laterality: N/A;   watchmen procedure     02/2022    Current Medications: No outpatient medications have been marked as taking for the 09/01/22 encounter (Appointment) with [facility].   Current Facility-Administered Medications for the 09/01/22 encounter (Appointment) with [facility]  Medication   botulinum toxin Type A (BOTOX) injection 100 Units     Allergies:   Amlodipine, Demeclocycline, Doxycycline, Erythromycin, Lisinopril, and Tetracyclines & related   Social History   Socioeconomic History   Marital status: Married    Spouse name: Not on file   Number of children: 2   Years of education: 12   Highest education level: Not on file  Occupational History   Occupation: Art therapist  Tobacco Use   Smoking status: Never   Smokeless tobacco: Never  Vaping Use   Vaping Use: Never used  Substance and Sexual Activity   Alcohol use: No   Drug use: No   Sexual activity: Not Currently  Other Topics Concern   Not on file  Social History Narrative   Lives at home with husband in [place].   Right-handed.   No caffeine use.   Manages a call center   Social Determinants of Health   Financial Resource Strain: Low Risk  (07/29/2022)   Overall Financial Resource Strain (CARDIA)    Difficulty of Paying Living Expenses: Not hard at all  Food Insecurity: No Food Insecurity (07/29/2022)   Hunger  Vital Sign    Worried About Running Out of Food in the Last Year: Never true    Ran Out of Food in the Last Year: Never true  Transportation Needs: No Transportation Needs (07/29/2022)   PRAPARE - Administrator, Civil Service (Medical): No    Lack of Transportation (Non-Medical): No  Physical Activity: Inactive (07/29/2022)   Exercise Vital Sign    Days of Exercise per Week: 0 days    Minutes of Exercise per Session: 90 min  Stress: No Stress Concern Present (07/29/2022)   [employer] of Occupational Health - Occupational Stress Questionnaire    Feeling of Stress : Not at all  Social Connections: Socially Integrated (07/29/2022)   Social Connection and Isolation Panel [NHANES]  Frequency of Communication with Friends and Family: More than three times a week    Frequency of Social Gatherings with Friends and Family: Three times a week    Attends Religious Services: More than 4 times per year    Active Member of Clubs or Organizations: Yes    Attends Engineer, structural: More than 4 times per year    Marital Status: Married     Family History: The patient's ***family history includes Breast cancer in her maternal aunt and maternal aunt; Breast cancer (age of onset: 44) in her sister; Cancer in her brother and maternal uncle; Colon polyps in her mother; Dementia in her brother; Diabetes in her brother and mother; Heart attack in her brother, father, and mother; Hypertension in her brother, father, mother, and son; Prostate cancer in her brother; Stroke in her brother and father. There is no history of Colon cancer, Esophageal cancer, Rectal cancer, or Stomach cancer.  ROS:   Please see the history of present illness.    All other systems reviewed and are negative.  EKGs/Labs/Other Studies Reviewed:    The following studies were reviewed today:   Cardiac Studies & Procedures   CARDIAC CATHETERIZATION  CARDIAC CATHETERIZATION  08/20/2016  Narrative Conclusions: 1. No angiographically significant coronary artery disease. 2. Normal left ventricular contraction. 3. Mildly elevated left ventricular filling pressure.  Recommendations: 1. Primary prevention of coronary artery disease. 2. Add low dose isosorbide mononitrate for possible component of microvascular dysfunction. 3. Restart rivaroxaban tomorrow evening if no evidence of bleeding right radial arteriotomy site. No need to continue aspirin in the setting of chronic anticoagulation. 4. Anticipate discharge tomorrow if no further chest pain. Follow-up as an outpatient with Dr. Mayford Knife.  Yvonne Kendall, MD [facility] Pager: [phone]  Findings Coronary Findings Diagnostic  Dominance: Right  Left Main Vessel is large. Vessel is angiographically normal.  Left Anterior Descending Vessel is large. Vessel is angiographically normal.  First Diagonal Branch Vessel is large in size.  Second Diagonal Branch Vessel is moderate in size.  Left Circumflex Vessel is large. Vessel is angiographically normal.  First Obtuse Marginal Branch Vessel is small in size.  Second Obtuse Marginal Branch Vessel is moderate in size.  Third Obtuse Marginal Branch Vessel is small in size.  Right Coronary Artery Vessel is large. Vessel is angiographically normal.  Right Posterior Descending Artery Vessel is moderate in size.  Right Posterior Atrioventricular Artery Vessel is large in size.  Intervention  No interventions have been documented.   STRESS TESTS  MYOCARDIAL PERFUSION IMAGING 08/17/2016  Narrative  Nuclear stress EF: 64%.  T wave inversion was noted during stress in the V4, V5 and V6 leads.  There was no ST segment deviation noted during stress.  Defect 1: There is a large defect of moderate severity.  Findings consistent with ischemia.  This is an intermediate risk study.  Large size, moderate intensity reversible  anteroapical, apical and lateral wall perfusion defect (SDS 8) suggestive of ischemia or less likely shifting breast artifact. LVEF 64% with normal wall motion. This is an intermediate risk study. Clinical correlation is advised.   ECHOCARDIOGRAM  ECHOCARDIOGRAM COMPLETE 10/29/2021  Narrative ECHOCARDIOGRAM REPORT    Patient Name:   MALANA EBERWEIN Tomlin   Date of Exam: 10/29/2021 Medical Rec #:  [id number]     Height:       67.0 in Accession #:    [id number]    Weight:       193.6 lb Date of  Birth:  1950-05-13     BSA:          1.994 m Patient Age:    71 years      BP:           162/89 mmHg Patient Gender: F             HR:           64 bpm. Exam Location:  [address]  Procedure: 2D Echo, 3D Echo, Cardiac Doppler and Color Doppler  Indications:    I48.91 Atrial Fibrillation  History:        Patient has prior history of Echocardiogram examinations, most recent 09/20/2016. Risk Factors:Hypertension and Diabetes. Pre-OP.  Sonographer:    Clearence Ped RCS Referring Phys: [id number] Rossie Muskrat LAMBERT  IMPRESSIONS   1. Left ventricular ejection fraction, by estimation, is 60 to 65%. Left ventricular ejection fraction by 3D volume is 67 %. The left ventricle has normal function. The left ventricle has no regional wall motion abnormalities. There is mild concentric left ventricular hypertrophy. Left ventricular diastolic parameters were normal. 2. Right ventricular systolic function is normal. The right ventricular size is normal. There is normal pulmonary artery systolic pressure. 3. Left atrial size was mildly dilated. 4. The mitral valve is normal in structure. No evidence of mitral valve regurgitation. No evidence of mitral stenosis. 5. The aortic valve is tricuspid. There is mild calcification of the aortic valve. There is mild thickening of the aortic valve. Aortic valve regurgitation is mild to moderate. No aortic stenosis is present. 6. The inferior vena cava is normal in size with greater  than 50% respiratory variability, suggesting right atrial pressure of 3 mmHg.  FINDINGS Left Ventricle: Left ventricular ejection fraction, by estimation, is 60 to 65%. Left ventricular ejection fraction by 3D volume is 67 %. The left ventricle has normal function. The left ventricle has no regional wall motion abnormalities. The left ventricular internal cavity size was normal in size. There is mild concentric left ventricular hypertrophy. Left ventricular diastolic parameters were normal.  Right Ventricle: The right ventricular size is normal. No increase in right ventricular wall thickness. Right ventricular systolic function is normal. There is normal pulmonary artery systolic pressure. The tricuspid regurgitant velocity is 2.04 m/s, and with an assumed right atrial pressure of 3 mmHg, the estimated right ventricular systolic pressure is 19.6 mmHg.  Left Atrium: Left atrial size was mildly dilated.  Right Atrium: Right atrial size was normal in size.  Pericardium: There is no evidence of pericardial effusion.  Mitral Valve: The mitral valve is normal in structure. No evidence of mitral valve regurgitation. No evidence of mitral valve stenosis.  Tricuspid Valve: The tricuspid valve is normal in structure. Tricuspid valve regurgitation is trivial. No evidence of tricuspid stenosis.  Aortic Valve: The aortic valve is tricuspid. There is mild calcification of the aortic valve. There is mild thickening of the aortic valve. Aortic valve regurgitation is mild to moderate. Aortic regurgitation PHT measures 403 msec. No aortic stenosis is present.  Pulmonic Valve: The pulmonic valve was normal in structure. Pulmonic valve regurgitation is trivial. No evidence of pulmonic stenosis.  Aorta: The aortic root is normal in size and structure.  Venous: The inferior vena cava is normal in size with greater than 50% respiratory variability, suggesting right atrial pressure of 3 mmHg.  IAS/Shunts: No  atrial level shunt detected by color flow Doppler.   LEFT VENTRICLE PLAX 2D LVIDd:  4.90 cm   Diastology LVIDs:         3.10 cm   LV e' medial:    8.59 cm/s LV PW:         1.20 cm   LV E/e' medial:  10.6 LV IVS:        1.10 cm   LV e' lateral:   5.77 cm/s LVOT diam:     2.00 cm   LV E/e' lateral: 15.7 LV SV:         91 LV SV Index:   46 LVOT Area:     3.14 cm  3D Volume EF: 3D EF:        67 % LV EDV:       139 ml LV ESV:       46 ml LV SV:        93 ml  RIGHT VENTRICLE RV Basal diam:  2.90 cm RV S prime:     17.40 cm/s TAPSE (M-mode): 2.1 cm RVSP:           19.6 mmHg  LEFT ATRIUM             Index        RIGHT ATRIUM           Index LA diam:        4.30 cm 2.16 cm/m   RA Pressure: 3.00 mmHg LA Vol (A2C):   31.4 ml 15.74 ml/m  RA Area:     11.80 cm LA Vol (A4C):   42.7 ml 21.41 ml/m  RA Volume:   24.60 ml  12.33 ml/m LA Biplane Vol: 37.5 ml 18.80 ml/m AORTIC VALVE LVOT Vmax:   133.00 cm/s LVOT Vmean:  96.600 cm/s LVOT VTI:    0.291 m AI PHT:      403 msec  AORTA Ao Root diam: 3.30 cm Ao Asc diam:  3.50 cm  MITRAL VALVE                TRICUSPID VALVE MV Area (PHT):              TR Peak grad:   16.6 mmHg MV Decel Time:              TR Vmax:        204.00 cm/s MV E velocity: 90.70 cm/s   Estimated RAP:  3.00 mmHg MV A velocity: 105.00 cm/s  RVSP:           19.6 mmHg MV E/A ratio:  0.86 SHUNTS Systemic VTI:  0.29 m Systemic Diam: 2.00 cm  Thurmon Fair MD Electronically signed by Thurmon Fair MD Signature Date/Time: 10/29/2021/4:11:13 PM    Final   TEE  ECHO TEE 03/04/2022  Narrative TRANSESOPHOGEAL ECHO REPORT    Patient Name:   CORALEIGH SHEERAN Mincy Date of Exam: 03/04/2022 Medical Rec #:  [id number]   Height:       67.0 in Accession #:    [id number]  Weight:       183.0 lb Date of Birth:  Nov 08, 1950   BSA:          1.947 m Patient Age:    71 years    BP:           149/64 mmHg Patient Gender: F           HR:           71 bpm. Exam Location:   Inpatient  Procedure: Transesophageal Echo, 3D Echo, Color Doppler and  Cardiac Doppler  Indications:     I48.1 Persistent atrial fibrillation  History:         Patient has prior history of Echocardiogram examinations, most recent 10/29/2021. Arrythmias:Atrial Fibrillation; Risk Factors:Hypertension, Diabetes and Dyslipidemia.  Sonographer:     Irving Burton Senior RDCS Referring Phys:  [id number] Lanier Prude Diagnosing Phys: Riley Lam MD  PROCEDURE: After discussion of the risks and benefits of a TEE, an informed consent was obtained from the patient. The transesophogeal probe was passed without difficulty through the esophogus of the patient. Sedation performed by different physician. The patient was monitored while under deep sedation. The patient developed no complications during the procedure.  IMPRESSIONS   1. Interventional TEE for LAA-O. Prior to procedure, there was a patent left atrial appendage. Maximal diamter 2.16 cm suitable for a 27 mm Watchman FLX. Transeptal puncture was performed- a PFO was noted but this was not crossed. A 27 mm Device was deployed but with peri-device leak, sub optimal morphology, and lower limit of normal compression. There was a proximal lobe seen in the 135 view that this device had difficulty excluding. Device was recaptured. a 31 mm Watchman FLX device was placed. No peri-device leak. Average compression ~26%. Left to right shunting noted post procedure; a trivial pericardial effusion remains unchanged through case.. Left atrial size was mildly dilated. No left atrial/left atrial appendage thrombus was detected. 2. Left ventricular ejection fraction, by estimation, is 60 to 65%. The left ventricle has normal function. 3. Right ventricular systolic function is normal. The right ventricular size is normal. 4. Right atrial size was mildly dilated. 5. The mitral valve is normal in structure. Trivial mitral valve regurgitation. No evidence of mitral  stenosis. 6. The aortic valve is tricuspid. Aortic valve regurgitation is mild to moderate. Aortic valve sclerosis is present, with no evidence of aortic valve stenosis. 7. Evidence of atrial level shunting detected by color flow Doppler.  FINDINGS Left Ventricle: Left ventricular ejection fraction, by estimation, is 60 to 65%. The left ventricle has normal function. The left ventricular internal cavity size was normal in size.  Right Ventricle: The right ventricular size is normal. Right vetricular wall thickness was not well visualized. Right ventricular systolic function is normal.  Left Atrium: Interventional TEE for LAA-O. Prior to procedure, there was a patent left atrial appendage. Maximal diamter 2.16 cm suitable for a 27 mm Watchman FLX. Transeptal puncture was performed- a PFO was noted but this was not crossed. A 27 mm Device was deployed but with peri-device leak, sub optimal morphology, and lower limit of normal compression. There was a proximal lobe seen in the 135 view that this device had difficulty excluding. Device was recaptured. a 31 mm Watchman FLX device was placed. No peri-device leak. Average compression ~26%. Left to right shunting noted post procedure; a trivial pericardial effusion remains unchanged through case. Left atrial size was mildly dilated. No left atrial/left atrial appendage thrombus was detected.  Right Atrium: Right atrial size was mildly dilated.  Pericardium: Trivial pericardial effusion is present.  Mitral Valve: The mitral valve is normal in structure. Trivial mitral valve regurgitation. No evidence of mitral valve stenosis.  Tricuspid Valve: The tricuspid valve is normal in structure. Tricuspid valve regurgitation is not demonstrated. No evidence of tricuspid stenosis.  Aortic Valve: The aortic valve is tricuspid. Aortic valve regurgitation is mild to moderate. Aortic valve sclerosis is present, with no evidence of aortic valve stenosis.  Pulmonic  Valve: The pulmonic valve was grossly normal. Pulmonic valve  regurgitation is not visualized.  Aorta: The aortic root and ascending aorta are structurally normal, with no evidence of dilitation.  IAS/Shunts: Evidence of atrial level shunting detected by color flow Doppler.  Additional Comments: Spectral Doppler performed.  Riley Lam MD Electronically signed by Riley Lam MD Signature Date/Time: 03/04/2022/1:38:00 PM    Final   MONITORS  CARDIAC EVENT MONITOR 04/02/2021            EKG:  EKG is *** ordered today.  The ekg ordered today demonstrates ***  Recent Labs: 03/01/2022: Hemoglobin 11.7; Platelets 315 07/30/2022: BUN 28; Creatinine, Ser 1.38; Potassium 3.8; Sodium 144; TSH 4.96  Recent Lipid Panel    Component Value Date/Time   CHOL 124 04/23/2022 0849   TRIG 161.0 (H) 04/23/2022 0849   HDL 46.20 04/23/2022 0849   CHOLHDL 3 04/23/2022 0849   VLDL 32.2 04/23/2022 0849   LDLCALC 46 04/23/2022 0849   LDLCALC 41 01/05/2019 1512   LDLDIRECT 44.0 03/27/2021 0812     Risk Assessment/Calculations:   {Does this patient have ATRIAL FIBRILLATION?:[phone]}   CHA2DS2-VASc Score = 4 [CHF History: 0, HTN History: 1, Diabetes History: 1, Stroke History: 0, Vascular Disease History: 0, Age Score: 1, Gender Score: 1].  Therefore, the patient's annual risk of stroke is 4.8 %.        HAS-BLED score *** Hypertension (Uncontrolled in 30 days)  {YES/NO:21197} Abnormal renal and liver function (Dialysis, transplant, Cr >2.26 mg/dL /Cirrhosis or Bilirubin >2x Normal or AST/ALT/AP >3x Normal) {YES/NO:21197} Stroke {YES/NO:21197} Bleeding {YES/NO:21197} Labile INR (Unstable/high INR) {YES/NO:21197} Elderly (>65) {YES/NO:21197} Drugs or alcohol (? 8 drinks/week, anti-plt or NSAID) {YES/NO:21197}   Physical Exam:    VS:  There were no vitals taken for this visit.    Wt Readings from Last 3 Encounters:  07/30/22 171 lb (77.6 kg)  05/24/22 170 lb (77.1 kg)   04/23/22 175 lb (79.4 kg)     GEN: *** Well nourished, well developed in no acute distress HEENT: Normal NECK: No JVD; No carotid bruits LYMPHATICS: No lymphadenopathy CARDIAC: ***RRR, no murmurs, rubs, gallops RESPIRATORY:  Clear to auscultation without rales, wheezing or rhonchi  ABDOMEN: Soft, non-tender, non-distended MUSCULOSKELETAL:  No edema; No deformity  SKIN: Warm and dry NEUROLOGIC:  Alert and oriented x 3 PSYCHIATRIC:  Normal affect   ASSESSMENT:    No diagnosis found. PLAN:    In order of problems listed above:       {Are you ordering a CV Procedure (e.g. stress test, cath, DCCV, TEE, etc)?   Press F2        :[id number]}    Medication Adjustments/Labs and Tests Ordered: Current medicines are reviewed at length with the patient today.  Concerns regarding medicines are outlined above.  No orders of the defined types were placed in this encounter.  No orders of the defined types were placed in this encounter.   There are no Patient Instructions on file for this visit.   Signed, Georgie Chard, NP  08/31/2022 12:57 PM    [facility]

## 2022-09-01 ENCOUNTER — Ambulatory Visit (INDEPENDENT_AMBULATORY_CARE_PROVIDER_SITE_OTHER): Payer: Medicare Other

## 2022-09-01 ENCOUNTER — Ambulatory Visit: Payer: Medicare Other | Attending: Cardiology | Admitting: Cardiology

## 2022-09-01 VITALS — BP 136/64 | HR 66 | Ht 67.0 in | Wt 170.0 lb

## 2022-09-01 DIAGNOSIS — I10 Essential (primary) hypertension: Secondary | ICD-10-CM | POA: Diagnosis not present

## 2022-09-01 DIAGNOSIS — Z95818 Presence of other cardiac implants and grafts: Secondary | ICD-10-CM | POA: Diagnosis not present

## 2022-09-01 DIAGNOSIS — I4891 Unspecified atrial fibrillation: Secondary | ICD-10-CM | POA: Diagnosis not present

## 2022-09-01 DIAGNOSIS — Z7984 Long term (current) use of oral hypoglycemic drugs: Secondary | ICD-10-CM

## 2022-09-01 DIAGNOSIS — R002 Palpitations: Secondary | ICD-10-CM

## 2022-09-01 DIAGNOSIS — E119 Type 2 diabetes mellitus without complications: Secondary | ICD-10-CM | POA: Diagnosis not present

## 2022-09-01 DIAGNOSIS — Z8679 Personal history of other diseases of the circulatory system: Secondary | ICD-10-CM | POA: Insufficient documentation

## 2022-09-01 DIAGNOSIS — E785 Hyperlipidemia, unspecified: Secondary | ICD-10-CM | POA: Insufficient documentation

## 2022-09-01 DIAGNOSIS — Z9889 Other specified postprocedural states: Secondary | ICD-10-CM | POA: Insufficient documentation

## 2022-09-01 MED ORDER — METOPROLOL TARTRATE 25 MG PO TABS: 12.5000 mg | ORAL_TABLET | ORAL | 3 refills | Status: DC | PRN

## 2022-09-01 NOTE — Progress Notes (Unsigned)
Enrolled patient for a 14 day Zio XT monitor to be mailed to patients home  Ashley Woodard to read

## 2022-09-01 NOTE — Patient Instructions (Signed)
Medication Instructions:  Your physician has recommended you make the following change in your medication:  STOP PLAVIX START  METOPROLOL 12.5-25 MG AS NEEDED FOR PALPATIONS  *If you need a refill on your cardiac medications before your next appointment, please call your pharmacy*   Lab Work: NONE If you have labs (blood work) drawn today and your tests are completely normal, you will receive your results only by: MyChart Message (if you have MyChart) OR A paper copy in the mail If you have any lab test that is abnormal or we need to change your treatment, we will call you to review the results.   Testing/Procedures: Ashley Woodard- Long Term Monitor Instructions  Your physician has requested you wear a ZIO patch monitor for 14 days.  This is a single patch monitor. [employer] supplies one patch monitor per enrollment. Additional stickers are not available. Please do not apply patch if you will be having a Nuclear Stress Test,  Echocardiogram, Cardiac CT, MRI, or Chest Xray during the period you would be wearing the  monitor. The patch cannot be worn during these tests. You cannot remove and re-apply the  ZIO XT patch monitor.  Your ZIO patch monitor will be mailed 3 day USPS to your address on file. It may take 3-5 days  to receive your monitor after you have been enrolled.  Once you have received your monitor, please review the enclosed instructions. Your monitor  has already been registered assigning a specific monitor serial # to you.  Billing and Patient Assistance Program Information  We have supplied [employer] with any of your insurance information on file for billing purposes. [employer] offers a sliding scale Patient Assistance Program for patients that do not have  insurance, or whose insurance does not completely cover the cost of the ZIO monitor.  You must apply for the Patient Assistance Program to qualify for this discounted rate.  To apply, please call [employer] at [phone],  select option 4, select option 2, ask to apply for  Patient Assistance Program. Ashley Woodard will ask your household income, and how many people  are in your household. They will quote your out-of-pocket cost based on that information.  [employer] will also be able to set up a 49-month, interest-free payment plan if needed.  Applying the monitor   Shave hair from upper left chest.  Hold abrader disc by orange tab. Rub abrader in 40 strokes over the upper left chest as  indicated in your monitor instructions.  Clean area with 4 enclosed alcohol pads. Let dry.  Apply patch as indicated in monitor instructions. Patch will be placed under collarbone on left  side of chest with arrow pointing upward.  Rub patch adhesive wings for 2 minutes. Remove white label marked "1". Remove the white  label marked "2". Rub patch adhesive wings for 2 additional minutes.  While looking in a mirror, press and release button in center of patch. A small green light will  flash 3-4 times. This will be your only indicator that the monitor has been turned on.  Do not shower for the first 24 hours. You may shower after the first 24 hours.  Press the button if you feel a symptom. You will hear a small click. Record Date, Time and  Symptom in the Patient Logbook.  When you are ready to remove the patch, follow instructions on the last 2 pages of Patient  Logbook. Stick patch monitor onto the last page of Patient Logbook.  Place Patient Logbook  in the blue and white box. Use locking tab on box and tape box closed  securely. The blue and white box has prepaid postage on it. Please place it in the mailbox as  soon as possible. Your physician should have your test results approximately 7 days after the  monitor has been mailed back to [facility].  Call [facility] Customer Care at [phone] if you have questions regarding  your ZIO XT patch monitor. Call them immediately if you see an orange light blinking on your   monitor.  If your monitor falls off in less than 4 days, contact our Monitor department at [phone].  If your monitor becomes loose or falls off after 4 days call [employer] at [phone] for  suggestions on securing your monitor    Follow-Up: At [facility], you and your health needs are our priority.  As part of our continuing mission to provide you with exceptional heart care, we have created designated Provider Care Teams.  These Care Teams include your primary Cardiologist (physician) and Advanced Practice Providers (APPs -  Physician Assistants and Nurse Practitioners) who all work together to provide you with the care you need, when you need it.  We recommend signing up for the patient portal called "MyChart".  Sign up information is provided on this After Visit Summary.  MyChart is used to connect with patients for Virtual Visits (Telemedicine).  Patients are able to view lab/test results, encounter notes, upcoming appointments, etc.  Non-urgent messages can be sent to your provider as well.   To learn more about what you can do with MyChart, go to [email or web address].    Your next appointment:   KEEP SCHEDULED FOLLOW-UP

## 2022-09-02 DIAGNOSIS — M9903 Segmental and somatic dysfunction of lumbar region: Secondary | ICD-10-CM | POA: Diagnosis not present

## 2022-09-02 DIAGNOSIS — M5432 Sciatica, left side: Secondary | ICD-10-CM | POA: Diagnosis not present

## 2022-09-02 DIAGNOSIS — M5136 Other intervertebral disc degeneration, lumbar region: Secondary | ICD-10-CM | POA: Diagnosis not present

## 2022-09-02 DIAGNOSIS — Q72892 Other reduction defects of left lower limb: Secondary | ICD-10-CM | POA: Diagnosis not present

## 2022-09-02 DIAGNOSIS — M9904 Segmental and somatic dysfunction of sacral region: Secondary | ICD-10-CM | POA: Diagnosis not present

## 2022-09-02 DIAGNOSIS — M5137 Other intervertebral disc degeneration, lumbosacral region: Secondary | ICD-10-CM | POA: Diagnosis not present

## 2022-09-02 DIAGNOSIS — M9905 Segmental and somatic dysfunction of pelvic region: Secondary | ICD-10-CM | POA: Diagnosis not present

## 2022-09-03 DIAGNOSIS — H35372 Puckering of macula, left eye: Secondary | ICD-10-CM | POA: Diagnosis not present

## 2022-09-03 DIAGNOSIS — E119 Type 2 diabetes mellitus without complications: Secondary | ICD-10-CM | POA: Diagnosis not present

## 2022-09-03 DIAGNOSIS — H43813 Vitreous degeneration, bilateral: Secondary | ICD-10-CM | POA: Diagnosis not present

## 2022-09-07 ENCOUNTER — Ambulatory Visit (HOSPITAL_COMMUNITY)
Admission: RE | Admit: 2022-09-07 | Discharge: 2022-09-07 | Disposition: A | Discharge status: Home / Self Care | Payer: Medicare Other | Source: Ambulatory Visit | Attending: Cardiology | Admitting: Cardiology

## 2022-09-07 DIAGNOSIS — R0989 Other specified symptoms and signs involving the circulatory and respiratory systems: Secondary | ICD-10-CM

## 2022-09-09 LAB — VAS US ABI WITH/WO TBI
Left ABI: 1.24
Right ABI: 1.29

## 2022-09-24 DIAGNOSIS — R002 Palpitations: Secondary | ICD-10-CM | POA: Diagnosis not present

## 2022-10-02 ENCOUNTER — Other Ambulatory Visit: Payer: Self-pay | Admitting: Family Medicine

## 2022-10-02 DIAGNOSIS — I10 Essential (primary) hypertension: Secondary | ICD-10-CM

## 2022-10-03 ENCOUNTER — Encounter: Payer: Self-pay | Admitting: Family Medicine

## 2022-10-04 ENCOUNTER — Ambulatory Visit: Payer: Medicare Other | Attending: Cardiology | Admitting: Cardiology

## 2022-10-04 VITALS — BP 144/78 | HR 62 | Ht 67.0 in | Wt 169.0 lb

## 2022-10-04 DIAGNOSIS — I480 Paroxysmal atrial fibrillation: Secondary | ICD-10-CM | POA: Insufficient documentation

## 2022-10-04 DIAGNOSIS — I10 Essential (primary) hypertension: Secondary | ICD-10-CM | POA: Diagnosis not present

## 2022-10-04 DIAGNOSIS — Z8679 Personal history of other diseases of the circulatory system: Secondary | ICD-10-CM | POA: Diagnosis not present

## 2022-10-04 DIAGNOSIS — Z9889 Other specified postprocedural states: Secondary | ICD-10-CM | POA: Diagnosis not present

## 2022-10-04 DIAGNOSIS — R002 Palpitations: Secondary | ICD-10-CM | POA: Insufficient documentation

## 2022-10-04 DIAGNOSIS — Z95818 Presence of other cardiac implants and grafts: Secondary | ICD-10-CM | POA: Diagnosis not present

## 2022-10-04 MED ORDER — METOPROLOL TARTRATE 25 MG PO TABS: 25.0000 mg | ORAL_TABLET | Freq: Two times a day (BID) | ORAL | 3 refills | Status: DC

## 2022-10-04 NOTE — Patient Instructions (Signed)
Medication Instructions:  Your physician has recommended you make the following change in your medication:   Increase metoprolol to 25 mg daily  *If you need a refill on your cardiac medications before your next appointment, please call your pharmacy*    Follow-Up: At [facility], you and your health needs are our priority.  As part of our continuing mission to provide you with exceptional heart care, we have created designated Provider Care Teams.  These Care Teams include your primary Cardiologist (physician) and Advanced Practice Providers (APPs -  Physician Assistants and Nurse Practitioners) who all work together to provide you with the care you need, when you need it.    Your next appointment:   As scheduled

## 2022-10-05 NOTE — Progress Notes (Signed)
[facility]                                     Cardiology Office Note:    Date:  10/05/2022   ID:  Ashley Woodard, DOB [birth year], MRN [id number]  PCP:  Mliss Sax, MD  [facility] Cardiologist:  Lewayne Bunting, MD  [facility] Electrophysiologist:  Lanier Prude, MD   Referring MD: Mliss Sax,*   Chief Complaint  Patient presents with   Follow-up    Follow up palpitations    History of Present Illness:    Ashley Woodard is a 72 y.o. female with a hx of paroxysmal atrial fibrillation on Coumadin s/p AF ablation 2019, HTN, and DM2. She has been followed by Dr. Ladona Ridgel for her AF and was referred to Dr. Lalla Brothers for the evaluation of LAAO placement due to frequent falls and poor medication compliance.     Ashley Woodard was seen as an outpatient and felt to be a poor candidate for long term anticoagulation. She underwent LAAO closure with a 27mm Watchman device and was restarted on Coumadin and ASA was added with plans to transition to DAPT with ASA and Plavix at the 45 days mark to continue through 6 months, then ASA monotherapy. Post implant CT with no leak or thrombus.    She was recently seen for 6 month f/u after LAAO and was doing well however reported increased frequency of what she thought was atrial fibrillation. At that time, she was not on beta blocker therapy. She wore a ZIO and was started on PRN metoprolol with plans for close follow. ZIO showed no recurrent AF however noted to have rare SVT +/- PVCs, PACs.   Today she is here and reports that she has felt better when taking metoprolol 25mg  with less palpitations. Otherwise denies chest pain, SOB, LE edema, orthopnea, PND, dizziness, or syncope. Denies bleeding in stool or urine.   Past Medical History:  Diagnosis Date   Abnormal cardiovascular stress test 08/21/2016   a. done for abnl EKG/cardiac risk factors -> admitted for cath 07/2016 which showed no significant CAD, normal LV contraction,  mildly elevated filling pressure. Low dose Imdur was added for possible component of microvascular dysfunction.    Allergy    Anxiety    Arthritis    "back, knees, fingers" (05/10/2017)   Chronic lower back pain    Colon polyps    Deafness in right ear    Depression    Diverticulosis    Hx of adenomatous colonic polyps 05/08/2003   Hypertension    Hypertriglyceridemia    Hypothyroidism    Migraine    "none in the 2000s" (05/10/2017)   Osteoporosis    Paroxysmal atrial fibrillation (HCC)    Pneumonia    "several times" (05/10/2017)   PONV (postoperative nausea and vomiting)    Presence of Watchman left atrial appendage closure device 03/04/2022   s/p LAAO with a 31 mm Watchman FLX by Dr. Lalla Brothers   S/P cardiac catheterization, 08/20/16 minimal CAD 08/21/2016   Type II diabetes mellitus (HCC)    Voice tremor     Past Surgical History:  Procedure Laterality Date   ABDOMINAL HYSTERECTOMY  1988   ATRIAL FIBRILLATION ABLATION N/A 05/10/2017   Procedure: ATRIAL FIBRILLATION ABLATION;  Surgeon: Hillis Range, MD;  Location: [facility] INVASIVE CV [facility];  Service: Cardiovascular;  Laterality: N/A;  COCHLEAR IMPLANT Right 2008   COLONOSCOPY     LEFT ATRIAL APPENDAGE OCCLUSION N/A 03/04/2022   Procedure: LEFT ATRIAL APPENDAGE OCCLUSION;  Surgeon: Lanier Prude, MD;  Location: [facility];  Service: Cardiovascular;  Laterality: N/A;   LEFT HEART CATH AND CORONARY ANGIOGRAPHY N/A 08/20/2016   Procedure: Left Heart Cath and Coronary Angiography;  Surgeon: Yvonne Kendall, MD;  Location: [facility] CV [facility];  Service: Cardiovascular;  Laterality: N/A;   LOOP RECORDER INSERTION N/A 11/29/2017   Procedure: LOOP RECORDER INSERTION;  Surgeon: Hillis Range, MD;  Location: [facility];  Service: Cardiovascular;  Laterality: N/A;   ORIF ANKLE FRACTURE Right 02/06/2019   Procedure: OPEN REDUCTION INTERNAL FIXATION (ORIF) ANKLE FRACTURE LATERAL MALLEOLUS, SYNDESMOSIS, RIGHT;  Surgeon: Terance Hart, MD;  Location: [facility];  Service: Orthopedics;  Laterality: Right;  SURGERY REQUEST TIME: 1.5 HOURS  CPT CODES: 63875, 27829, 64332, 95188   TEE WITHOUT CARDIOVERSION N/A 03/04/2022   Procedure: TRANSESOPHAGEAL ECHOCARDIOGRAM (TEE);  Surgeon: Lanier Prude, MD;  Location: [facility] CV LAB;  Service: Cardiovascular;  Laterality: N/A;   watchmen procedure     02/2022    Current Medications: Current Meds  Medication Sig   metoprolol tartrate (LOPRESSOR) 25 MG tablet Take 1 tablet (25 mg total) by mouth 2 (two) times daily.   Current Facility-Administered Medications for the 10/04/22 encounter (Office Visit) with [facility]  Medication   botulinum toxin Type A (BOTOX) injection 100 Units     Allergies:   Amlodipine, Demeclocycline, Doxycycline, Erythromycin, Lisinopril, and Tetracyclines & related   Social History   Socioeconomic History   Marital status: Married    Spouse name: Not on file   Number of children: 2   Years of education: 12   Highest education level: Not on file  Occupational History   Occupation: Art therapist  Tobacco Use   Smoking status: Never   Smokeless tobacco: Never  Vaping Use   Vaping status: Never Used  Substance and Sexual Activity   Alcohol use: No   Drug use: No   Sexual activity: Not Currently  Other Topics Concern   Not on file  Social History Narrative   Lives at home with husband in [place].   Right-handed.   No caffeine use.   Manages a call center   Social Determinants of Health   Financial Resource Strain: Low Risk  (07/29/2022)   Overall Financial Resource Strain (CARDIA)    Difficulty of Paying Living Expenses: Not hard at all  Food Insecurity: No Food Insecurity (07/29/2022)   Hunger Vital Sign    Worried About Running Out of Food in the Last Year: Never true    Ran Out of Food in the Last Year: Never true  Transportation Needs: No Transportation Needs (07/29/2022)    PRAPARE - Administrator, Civil Service (Medical): No    Lack of Transportation (Non-Medical): No  Physical Activity: Inactive (07/29/2022)   Exercise Vital Sign    Days of Exercise per Week: 0 days    Minutes of Exercise per Session: 90 min  Stress: No Stress Concern Present (07/29/2022)   [employer] of Occupational Health - Occupational Stress Questionnaire    Feeling of Stress : Not at all  Social Connections: Socially Integrated (07/29/2022)   Social Connection and Isolation Panel [NHANES]    Frequency of Communication with Friends and Family: More than three times a week    Frequency of Social  Gatherings with Friends and Family: Three times a week    Attends Religious Services: More than 4 times per year    Active Member of Clubs or Organizations: Yes    Attends Engineer, structural: More than 4 times per year    Marital Status: Married     Family History: The patient's family history includes Breast cancer in her maternal aunt and maternal aunt; Breast cancer (age of onset: 80) in her sister; Cancer in her brother and maternal uncle; Colon polyps in her mother; Dementia in her brother; Diabetes in her brother and mother; Heart attack in her brother, father, and mother; Hypertension in her brother, father, mother, and son; Prostate cancer in her brother; Stroke in her brother and father. There is no history of Colon cancer, Esophageal cancer, Rectal cancer, or Stomach cancer.  ROS:   Please see the history of present illness.    All other systems reviewed and are negative.  EKGs/Labs/Other Studies Reviewed:    The following studies were reviewed today:   Cardiac Studies & Procedures   CARDIAC CATHETERIZATION  CARDIAC CATHETERIZATION 08/20/2016  Narrative Conclusions: 1. No angiographically significant coronary artery disease. 2. Normal left ventricular contraction. 3. Mildly elevated left ventricular filling pressure.  Recommendations: 1. Primary  prevention of coronary artery disease. 2. Add low dose isosorbide mononitrate for possible component of microvascular dysfunction. 3. Restart rivaroxaban tomorrow evening if no evidence of bleeding right radial arteriotomy site. No need to continue aspirin in the setting of chronic anticoagulation. 4. Anticipate discharge tomorrow if no further chest pain. Follow-up as an outpatient with Dr. Mayford Knife.  Yvonne Kendall, MD [facility] Pager: [phone]  Findings Coronary Findings Diagnostic  Dominance: Right  Left Main Vessel is large. Vessel is angiographically normal.  Left Anterior Descending Vessel is large. Vessel is angiographically normal.  First Diagonal Branch Vessel is large in size.  Second Diagonal Branch Vessel is moderate in size.  Left Circumflex Vessel is large. Vessel is angiographically normal.  First Obtuse Marginal Branch Vessel is small in size.  Second Obtuse Marginal Branch Vessel is moderate in size.  Third Obtuse Marginal Branch Vessel is small in size.  Right Coronary Artery Vessel is large. Vessel is angiographically normal.  Right Posterior Descending Artery Vessel is moderate in size.  Right Posterior Atrioventricular Artery Vessel is large in size.  Intervention  No interventions have been documented.   STRESS TESTS  MYOCARDIAL PERFUSION IMAGING 08/17/2016  Narrative  Nuclear stress EF: 64%.  T wave inversion was noted during stress in the V4, V5 and V6 leads.  There was no ST segment deviation noted during stress.  Defect 1: There is a large defect of moderate severity.  Findings consistent with ischemia.  This is an intermediate risk study.  Large size, moderate intensity reversible anteroapical, apical and lateral wall perfusion defect (SDS 8) suggestive of ischemia or less likely shifting breast artifact. LVEF 64% with normal wall motion. This is an intermediate risk study. Clinical correlation is advised.    ECHOCARDIOGRAM  ECHOCARDIOGRAM COMPLETE 10/29/2021  Narrative ECHOCARDIOGRAM REPORT    Patient Name:   Ashley Woodard   Date of Exam: 10/29/2021 Medical Rec #:  [id number]     Height:       67.0 in Accession #:    [id number]    Weight:       193.6 lb Date of Birth:  Jul 19, 1950     BSA:          1.994 m  Patient Age:    71 years      BP:           162/89 mmHg Patient Gender: F             HR:           64 bpm. Exam Location:  [address]  Procedure: 2D Echo, 3D Echo, Cardiac Doppler and Color Doppler  Indications:    I48.91 Atrial Fibrillation  History:        Patient has prior history of Echocardiogram examinations, most recent 09/20/2016. Risk Factors:Hypertension and Diabetes. Pre-OP.  Sonographer:    Clearence Ped RCS Referring Phys: [id number] Rossie Muskrat LAMBERT  IMPRESSIONS   1. Left ventricular ejection fraction, by estimation, is 60 to 65%. Left ventricular ejection fraction by 3D volume is 67 %. The left ventricle has normal function. The left ventricle has no regional wall motion abnormalities. There is mild concentric left ventricular hypertrophy. Left ventricular diastolic parameters were normal. 2. Right ventricular systolic function is normal. The right ventricular size is normal. There is normal pulmonary artery systolic pressure. 3. Left atrial size was mildly dilated. 4. The mitral valve is normal in structure. No evidence of mitral valve regurgitation. No evidence of mitral stenosis. 5. The aortic valve is tricuspid. There is mild calcification of the aortic valve. There is mild thickening of the aortic valve. Aortic valve regurgitation is mild to moderate. No aortic stenosis is present. 6. The inferior vena cava is normal in size with greater than 50% respiratory variability, suggesting right atrial pressure of 3 mmHg.  FINDINGS Left Ventricle: Left ventricular ejection fraction, by estimation, is 60 to 65%. Left ventricular ejection fraction by 3D volume is 67 %. The  left ventricle has normal function. The left ventricle has no regional wall motion abnormalities. The left ventricular internal cavity size was normal in size. There is mild concentric left ventricular hypertrophy. Left ventricular diastolic parameters were normal.  Right Ventricle: The right ventricular size is normal. No increase in right ventricular wall thickness. Right ventricular systolic function is normal. There is normal pulmonary artery systolic pressure. The tricuspid regurgitant velocity is 2.04 m/s, and with an assumed right atrial pressure of 3 mmHg, the estimated right ventricular systolic pressure is 19.6 mmHg.  Left Atrium: Left atrial size was mildly dilated.  Right Atrium: Right atrial size was normal in size.  Pericardium: There is no evidence of pericardial effusion.  Mitral Valve: The mitral valve is normal in structure. No evidence of mitral valve regurgitation. No evidence of mitral valve stenosis.  Tricuspid Valve: The tricuspid valve is normal in structure. Tricuspid valve regurgitation is trivial. No evidence of tricuspid stenosis.  Aortic Valve: The aortic valve is tricuspid. There is mild calcification of the aortic valve. There is mild thickening of the aortic valve. Aortic valve regurgitation is mild to moderate. Aortic regurgitation PHT measures 403 msec. No aortic stenosis is present.  Pulmonic Valve: The pulmonic valve was normal in structure. Pulmonic valve regurgitation is trivial. No evidence of pulmonic stenosis.  Aorta: The aortic root is normal in size and structure.  Venous: The inferior vena cava is normal in size with greater than 50% respiratory variability, suggesting right atrial pressure of 3 mmHg.  IAS/Shunts: No atrial level shunt detected by color flow Doppler.   LEFT VENTRICLE PLAX 2D LVIDd:         4.90 cm   Diastology LVIDs:         3.10 cm   LV e'  medial:    8.59 cm/s LV PW:         1.20 cm   LV E/e' medial:  10.6 LV IVS:         1.10 cm   LV e' lateral:   5.77 cm/s LVOT diam:     2.00 cm   LV E/e' lateral: 15.7 LV SV:         91 LV SV Index:   46 LVOT Area:     3.14 cm  3D Volume EF: 3D EF:        67 % LV EDV:       139 ml LV ESV:       46 ml LV SV:        93 ml  RIGHT VENTRICLE RV Basal diam:  2.90 cm RV S prime:     17.40 cm/s TAPSE (M-mode): 2.1 cm RVSP:           19.6 mmHg  LEFT ATRIUM             Index        RIGHT ATRIUM           Index LA diam:        4.30 cm 2.16 cm/m   RA Pressure: 3.00 mmHg LA Vol (A2C):   31.4 ml 15.74 ml/m  RA Area:     11.80 cm LA Vol (A4C):   42.7 ml 21.41 ml/m  RA Volume:   24.60 ml  12.33 ml/m LA Biplane Vol: 37.5 ml 18.80 ml/m AORTIC VALVE LVOT Vmax:   133.00 cm/s LVOT Vmean:  96.600 cm/s LVOT VTI:    0.291 m AI PHT:      403 msec  AORTA Ao Root diam: 3.30 cm Ao Asc diam:  3.50 cm  MITRAL VALVE                TRICUSPID VALVE MV Area (PHT):              TR Peak grad:   16.6 mmHg MV Decel Time:              TR Vmax:        204.00 cm/s MV E velocity: 90.70 cm/s   Estimated RAP:  3.00 mmHg MV A velocity: 105.00 cm/s  RVSP:           19.6 mmHg MV E/A ratio:  0.86 SHUNTS Systemic VTI:  0.29 m Systemic Diam: 2.00 cm  Thurmon Fair MD Electronically signed by Thurmon Fair MD Signature Date/Time: 10/29/2021/4:11:13 PM    Final   TEE  ECHO TEE 03/04/2022  Narrative TRANSESOPHOGEAL ECHO REPORT    Patient Name:   Ashley Woodard Date of Exam: 03/04/2022 Medical Rec #:  [id number]   Height:       67.0 in Accession #:    [id number]  Weight:       183.0 lb Date of Birth:  30-Aug-1950   BSA:          1.947 m Patient Age:    71 years    BP:           149/64 mmHg Patient Gender: F           HR:           71 bpm. Exam Location:  Inpatient  Procedure: Transesophageal Echo, 3D Echo, Color Doppler and Cardiac Doppler  Indications:     I48.1 Persistent atrial fibrillation  History:  Patient has prior history of Echocardiogram examinations, most recent  10/29/2021. Arrythmias:Atrial Fibrillation; Risk Factors:Hypertension, Diabetes and Dyslipidemia.  Sonographer:     Irving Burton Senior RDCS Referring Phys:  [id number] Lanier Prude Diagnosing Phys: Riley Lam MD  PROCEDURE: After discussion of the risks and benefits of a TEE, an informed consent was obtained from the patient. The transesophogeal probe was passed without difficulty through the esophogus of the patient. Sedation performed by different physician. The patient was monitored while under deep sedation. The patient developed no complications during the procedure.  IMPRESSIONS   1. Interventional TEE for LAA-O. Prior to procedure, there was a patent left atrial appendage. Maximal diamter 2.16 cm suitable for a 27 mm Watchman FLX. Transeptal puncture was performed- a PFO was noted but this was not crossed. A 27 mm Device was deployed but with peri-device leak, sub optimal morphology, and lower limit of normal compression. There was a proximal lobe seen in the 135 view that this device had difficulty excluding. Device was recaptured. a 31 mm Watchman FLX device was placed. No peri-device leak. Average compression ~26%. Left to right shunting noted post procedure; a trivial pericardial effusion remains unchanged through case.. Left atrial size was mildly dilated. No left atrial/left atrial appendage thrombus was detected. 2. Left ventricular ejection fraction, by estimation, is 60 to 65%. The left ventricle has normal function. 3. Right ventricular systolic function is normal. The right ventricular size is normal. 4. Right atrial size was mildly dilated. 5. The mitral valve is normal in structure. Trivial mitral valve regurgitation. No evidence of mitral stenosis. 6. The aortic valve is tricuspid. Aortic valve regurgitation is mild to moderate. Aortic valve sclerosis is present, with no evidence of aortic valve stenosis. 7. Evidence of atrial level shunting detected by color flow  Doppler.  FINDINGS Left Ventricle: Left ventricular ejection fraction, by estimation, is 60 to 65%. The left ventricle has normal function. The left ventricular internal cavity size was normal in size.  Right Ventricle: The right ventricular size is normal. Right vetricular wall thickness was not well visualized. Right ventricular systolic function is normal.  Left Atrium: Interventional TEE for LAA-O. Prior to procedure, there was a patent left atrial appendage. Maximal diamter 2.16 cm suitable for a 27 mm Watchman FLX. Transeptal puncture was performed- a PFO was noted but this was not crossed. A 27 mm Device was deployed but with peri-device leak, sub optimal morphology, and lower limit of normal compression. There was a proximal lobe seen in the 135 view that this device had difficulty excluding. Device was recaptured. a 31 mm Watchman FLX device was placed. No peri-device leak. Average compression ~26%. Left to right shunting noted post procedure; a trivial pericardial effusion remains unchanged through case. Left atrial size was mildly dilated. No left atrial/left atrial appendage thrombus was detected.  Right Atrium: Right atrial size was mildly dilated.  Pericardium: Trivial pericardial effusion is present.  Mitral Valve: The mitral valve is normal in structure. Trivial mitral valve regurgitation. No evidence of mitral valve stenosis.  Tricuspid Valve: The tricuspid valve is normal in structure. Tricuspid valve regurgitation is not demonstrated. No evidence of tricuspid stenosis.  Aortic Valve: The aortic valve is tricuspid. Aortic valve regurgitation is mild to moderate. Aortic valve sclerosis is present, with no evidence of aortic valve stenosis.  Pulmonic Valve: The pulmonic valve was grossly normal. Pulmonic valve regurgitation is not visualized.  Aorta: The aortic root and ascending aorta are structurally normal, with no evidence of dilitation.  IAS/Shunts:  Evidence of atrial  level shunting detected by color flow Doppler.  Additional Comments: Spectral Doppler performed.  Riley Lam MD Electronically signed by Riley Lam MD Signature Date/Time: 03/04/2022/1:38:00 PM    Final   MONITORS  LONG TERM MONITOR (3-14 DAYS) 09/24/2022  Narrative HR 47 - 250 bpm, average 66 bpm. 59 nonsustained SVT, longest 10 seconds with average rate of 139 bpm. Rare supraventricular and ventricular ectopy. No sustained arrhythmias. No atrial fibrillation. Symptom trigger episodes correspond to sinus rhythm +/- PVC's, PAC's, SVT  Sheria Lang T. Lalla Brothers, MD, [facility], [facility] Cardiac Electrophysiology           EKG:  EKG is not ordered today.    Recent Labs: 03/01/2022: Hemoglobin 11.7; Platelets 315 07/30/2022: BUN 28; Creatinine, Ser 1.38; Potassium 3.8; Sodium 144; TSH 4.96   Recent Lipid Panel    Component Value Date/Time   CHOL 124 04/23/2022 0849   TRIG 161.0 (H) 04/23/2022 0849   HDL 46.20 04/23/2022 0849   CHOLHDL 3 04/23/2022 0849   VLDL 32.2 04/23/2022 0849   LDLCALC 46 04/23/2022 0849   LDLCALC 41 01/05/2019 1512   LDLDIRECT 44.0 03/27/2021 0812   Physical Exam:    VS:  BP (!) 144/78   Pulse 62   Ht 5\' 7"  (1.702 m)   Wt 169 lb (76.7 kg)   SpO2 98%   BMI 26.47 kg/m     Wt Readings from Last 3 Encounters:  10/04/22 169 lb (76.7 kg)  09/01/22 170 lb (77.1 kg)  07/30/22 171 lb (77.6 kg)    General: Well developed, well nourished, NAD Lungs:Clear to ausculation bilaterally. No wheezes, rales, or rhonchi. Breathing is unlabored. Cardiovascular: RRR with S1 S2. No murmurs Extremities: No edema. Neuro: Alert and oriented. No focal deficits. No facial asymmetry. MAE spontaneously. Psych: Responds to questions appropriately with normal affect.    ASSESSMENT/PLAN:    PAF: s/p LAAO closure with Watchman 03/04/22. Continue ASA given hx of DM2, HLD, HTN, and family hx of CAD. No longer requires dental SBE.    Palpitations: s/p AF ablation  with Dr. Johney Frame 04/2017. ZIO monitor with no recurrent AF however with rare SVT/PVCs/ PACs. Place on PRN metoprolol. Reports improvement when she takes beta blocker therefore will start metoprolol 25mg  BID for more consistent coverage.    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 Remains asymptomatic at this time.    HTN: Stable  Medication Adjustments/Labs and Tests Ordered: Current medicines are reviewed at length with the patient today.  Concerns regarding medicines are outlined above.  No orders of the defined types were placed in this encounter.  Meds ordered this encounter  Medications   metoprolol tartrate (LOPRESSOR) 25 MG tablet    Sig: Take 1 tablet (25 mg total) by mouth 2 (two) times daily.    Dispense:  180 tablet    Refill:  3    Patient Instructions  Medication Instructions:  Your physician has recommended you make the following change in your medication:   Increase metoprolol to 25 mg daily  *If you need a refill on your cardiac medications before your next appointment, please call your pharmacy*    Follow-Up: At [facility], you and your health needs are our priority.  As part of our continuing mission to provide you with exceptional heart care, we have created designated Provider Care Teams.  These Care Teams include your primary Cardiologist (physician) and Advanced Practice Providers (APPs -  Physician Assistants and Nurse Practitioners) who all  work together to provide you with the care you need, when you need it.    Your next appointment:   As scheduled      Signed, Georgie Chard, NP  10/05/2022 11:33 AM    [facility]

## 2022-10-07 ENCOUNTER — Encounter (INDEPENDENT_AMBULATORY_CARE_PROVIDER_SITE_OTHER): Payer: Self-pay

## 2022-10-22 ENCOUNTER — Other Ambulatory Visit: Payer: Self-pay | Admitting: Family Medicine

## 2022-10-22 DIAGNOSIS — E039 Hypothyroidism, unspecified: Secondary | ICD-10-CM

## 2022-11-04 ENCOUNTER — Encounter: Payer: Self-pay | Admitting: Family Medicine

## 2022-11-04 ENCOUNTER — Ambulatory Visit (INDEPENDENT_AMBULATORY_CARE_PROVIDER_SITE_OTHER): Payer: Medicare Other | Admitting: Family Medicine

## 2022-11-04 VITALS — BP 140/68 | HR 60 | Temp 96.7°F | Ht 67.0 in | Wt 172.0 lb

## 2022-11-04 DIAGNOSIS — E7800 Pure hypercholesterolemia, unspecified: Secondary | ICD-10-CM

## 2022-11-04 DIAGNOSIS — E1122 Type 2 diabetes mellitus with diabetic chronic kidney disease: Secondary | ICD-10-CM

## 2022-11-04 DIAGNOSIS — N1832 Chronic kidney disease, stage 3b: Secondary | ICD-10-CM

## 2022-11-04 DIAGNOSIS — Z23 Encounter for immunization: Secondary | ICD-10-CM

## 2022-11-04 LAB — BASIC METABOLIC PANEL
BUN: 27 mg/dL — ABNORMAL HIGH (ref 6–23)
CO2: 25 meq/L (ref 19–32)
Calcium: 9.6 mg/dL (ref 8.4–10.5)
Chloride: 105 meq/L (ref 96–112)
Creatinine, Ser: 1.31 mg/dL — ABNORMAL HIGH (ref 0.40–1.20)
GFR: 41 mL/min — ABNORMAL LOW (ref >60.00)
Glucose, Bld: 100 mg/dL — ABNORMAL HIGH (ref 70–99)
Potassium: 3.7 meq/L (ref 3.5–5.1)
Sodium: 142 meq/L (ref 135–145)

## 2022-11-04 LAB — HEMOGLOBIN A1C: Hgb A1c MFr Bld: 6.2 % (ref 4.6–6.5)

## 2022-11-04 MED ORDER — DAPAGLIFLOZIN PROPANEDIOL 10 MG PO TABS: 10.0000 mg | ORAL_TABLET | Freq: Every day | ORAL | 1 refills | Status: DC

## 2022-11-04 NOTE — Progress Notes (Signed)
Established Patient Office Visit   Subjective:  Patient ID: Ashley Woodard, female    DOB: [birth year]  Age: 72 y.o. MRN: [id number]  Chief Complaint  Patient presents with   Medical Management of Chronic Issues    3 month follow up. Pt is fasting. No concerns or questions. Flu shot today.     HPI Encounter Diagnoses  Name Primary?   Type 2 diabetes mellitus with stage 3b chronic kidney disease, without long-term current use of insulin (HCC) Yes   Need for immunization against influenza    Elevated LDL cholesterol level    Stage 3b chronic kidney disease (HCC)    Follow-up of above.  History of hypertension followed by cardiology.   Review of Systems  Constitutional: Negative.   HENT: Negative.    Eyes:  Negative for blurred vision, discharge and redness.  Respiratory: Negative.    Cardiovascular: Negative.   Gastrointestinal:  Negative for abdominal pain.  Genitourinary: Negative.   Musculoskeletal: Negative.  Negative for myalgias.  Skin:  Negative for rash.  Neurological:  Negative for tingling, loss of consciousness and weakness.  Endo/Heme/Allergies:  Negative for polydipsia.     Current Outpatient Medications:    amLODipine (NORVASC) 10 MG tablet, TAKE 1 TABLET BY MOUTH EVERY DAY, Disp: 90 tablet, Rfl: 1   aspirin EC ([employer] ASPIRIN LOW DOSE) 81 MG tablet, TAKE 1 TABLET (81 MG TOTAL) BY MOUTH DAILY. SWALLOW WHOLE., Disp: 90 tablet, Rfl: 3   atorvastatin (LIPITOR) 10 MG tablet, TAKE 1 TABLET BY MOUTH EVERY DAY, Disp: 90 tablet, Rfl: 3   Blood Glucose Calibration (ONETOUCH VERIO) SOLN, 1 Act by In Vitro route in the morning and at bedtime., Disp: 1 each, Rfl: 5   Blood Glucose Monitoring Suppl (ONETOUCH VERIO FLEX SYSTEM) w/Device KIT, 1 Act by Does not apply route in the morning and at bedtime., Disp: 1 kit, Rfl: 3   dapagliflozin propanediol (FARXIGA) 10 MG TABS tablet, Take 1 tablet (10 mg total) by mouth daily before breakfast., Disp: 90 tablet, Rfl: 1   diphenhydrAMINE  HCl, Sleep, (SLEEP-AID) 50 MG CAPS, Take 50 mg by mouth at bedtime., Disp: , Rfl:    DULoxetine (CYMBALTA) 60 MG capsule, TAKE 1 CAPSULE BY MOUTH EVERY DAY, Disp: 90 capsule, Rfl: 3   fluticasone (FLONASE) 50 MCG/ACT nasal spray, Place 1 spray into both nostrils daily. Begin by using 2 sprays in each nare daily for 3 to 5 days, then decrease to 1 spray in each nare daily., Disp: 31.6 mL, Rfl: 1   glucose blood (ONETOUCH VERIO) test strip, 1 each by Other route in the morning and at bedtime. Use as instructed, Disp: 100 each, Rfl: 12   hydrALAZINE (APRESOLINE) 50 MG tablet, TAKE 1 TABLET BY MOUTH TWICE A DAY, Disp: 180 tablet, Rfl: 0   irbesartan (AVAPRO) 300 MG tablet, TAKE 1 TABLET BY MOUTH EVERY DAY, Disp: 90 tablet, Rfl: 0   Lancets (ONETOUCH ULTRASOFT) lancets, 1 each by Other route in the morning and at bedtime. Use as instructed, Disp: 100 each, Rfl: 5   levothyroxine (SYNTHROID) 75 MCG tablet, TAKE 1 TABLET BY MOUTH EVERY DAY, Disp: 90 tablet, Rfl: 3   metFORMIN (GLUCOPHAGE) 500 MG tablet, TAKE 1 TABLET BY MOUTH TWICE A DAY WITH FOOD, Disp: 180 tablet, Rfl: 0   metoprolol tartrate (LOPRESSOR) 25 MG tablet, Take 1 tablet (25 mg total) by mouth 2 (two) times daily., Disp: 180 tablet, Rfl: 3   torsemide (DEMADEX) 20 MG tablet, Take 1 tablet (20  mg total) by mouth daily., Disp: 90 tablet, Rfl: 0  Current Facility-Administered Medications:    botulinum toxin Type A (BOTOX) injection 100 Units, 100 Units, Intramuscular, Once, Levert Feinstein, MD   Objective:     BP (!) 140/68   Pulse 60   Temp (!) 96.7 F (35.9 C)   Ht 5\' 7"  (1.702 m)   Wt 172 lb (78 kg)   SpO2 96%   BMI 26.94 kg/m  BP Readings from Last 3 Encounters:  11/04/22 (!) 140/68  10/04/22 (!) 144/78  09/01/22 136/64   Wt Readings from Last 3 Encounters:  11/04/22 172 lb (78 kg)  10/04/22 169 lb (76.7 kg)  09/01/22 170 lb (77.1 kg)      Physical Exam Constitutional:      General: She is not in acute distress.     Appearance: Normal appearance. She is not ill-appearing, toxic-appearing or diaphoretic.  HENT:     Head: Normocephalic and atraumatic.     Right Ear: External ear normal.     Left Ear: External ear normal.  Eyes:     General: No scleral icterus.       Right eye: No discharge.        Left eye: No discharge.     Extraocular Movements: Extraocular movements intact.     Conjunctiva/sclera: Conjunctivae normal.  Pulmonary:     Effort: Pulmonary effort is normal. No respiratory distress.  Skin:    General: Skin is warm and dry.  Neurological:     Mental Status: She is alert and oriented to person, place, and time.  Psychiatric:        Mood and Affect: Mood normal.        Behavior: Behavior normal.      No results found for any visits on 11/04/22.    The ASCVD Risk score (Arnett DK, et al., 2019) failed to calculate for the following reasons:   The valid total cholesterol range is 130 to 320 mg/dL    Assessment & Plan:   Type 2 diabetes mellitus with stage 3b chronic kidney disease, without long-term current use of insulin (HCC) -     Basic metabolic panel -     Hemoglobin A1c -     Dapagliflozin Propanediol; Take 1 tablet (10 mg total) by mouth daily before breakfast.  Dispense: 90 tablet; Refill: 1  Need for immunization against influenza -     Flu Vaccine Trivalent High Dose (Fluad)  Elevated LDL cholesterol level  Stage 3b chronic kidney disease (HCC) -     Basic metabolic panel -     Dapagliflozin Propanediol; Take 1 tablet (10 mg total) by mouth daily before breakfast.  Dispense: 90 tablet; Refill: 1    Return in about 3 months (around 02/03/2023).  Will start Farxiga to check further decline in renal function.  Information was given on Farxiga.  Unfortunately it is expensive for her.  She will continue follow-up with cardiology for hypertension.  Cholesterol well-controlled with current regimen.  Mliss Sax, MD

## 2022-11-23 ENCOUNTER — Other Ambulatory Visit: Payer: Self-pay | Admitting: Family Medicine

## 2022-11-23 DIAGNOSIS — I10 Essential (primary) hypertension: Secondary | ICD-10-CM

## 2022-12-06 ENCOUNTER — Other Ambulatory Visit: Payer: Self-pay | Admitting: Family Medicine

## 2022-12-06 DIAGNOSIS — I10 Essential (primary) hypertension: Secondary | ICD-10-CM

## 2022-12-14 ENCOUNTER — Other Ambulatory Visit: Payer: Self-pay | Admitting: Family Medicine

## 2022-12-14 DIAGNOSIS — Z1231 Encounter for screening mammogram for malignant neoplasm of breast: Secondary | ICD-10-CM

## 2022-12-28 ENCOUNTER — Ambulatory Visit
Admission: RE | Admit: 2022-12-28 | Discharge: 2022-12-28 | Disposition: A | Discharge status: Home / Self Care | Payer: Medicare Other | Source: Ambulatory Visit | Attending: Internal Medicine | Admitting: Internal Medicine

## 2022-12-28 ENCOUNTER — Other Ambulatory Visit: Payer: Self-pay | Admitting: Family Medicine

## 2022-12-28 VITALS — BP 151/79 | HR 54 | Temp 98.4°F | Resp 20

## 2022-12-28 DIAGNOSIS — J0181 Other acute recurrent sinusitis: Secondary | ICD-10-CM

## 2022-12-28 DIAGNOSIS — E119 Type 2 diabetes mellitus without complications: Secondary | ICD-10-CM

## 2022-12-28 DIAGNOSIS — J309 Allergic rhinitis, unspecified: Secondary | ICD-10-CM | POA: Diagnosis not present

## 2022-12-28 DIAGNOSIS — I10 Essential (primary) hypertension: Secondary | ICD-10-CM

## 2022-12-28 MED ORDER — PROMETHAZINE-DM 6.25-15 MG/5ML PO SYRP: 5.0000 mL | ORAL_SOLUTION | Freq: Every evening | ORAL | 0 refills | Status: DC | PRN

## 2022-12-28 MED ORDER — CETIRIZINE HCL 10 MG PO TABS: 10.0000 mg | ORAL_TABLET | Freq: Every day | ORAL | 0 refills | Status: DC

## 2022-12-28 MED ORDER — AMOXICILLIN 875 MG PO TABS: 875.0000 mg | ORAL_TABLET | Freq: Two times a day (BID) | ORAL | 0 refills | Status: DC

## 2022-12-28 NOTE — Discharge Instructions (Signed)
We will manage this as a sinus infection with amoxicillin. For sore throat or cough try using a honey-based tea. Use 3 teaspoons of honey with juice squeezed from half lemon. Place shaved pieces of ginger into 1/2-1 cup of water and warm over stove top. Then mix the ingredients and repeat every 4 hours as needed. Please take Tylenol 500mg -650mg  every 6 hours for throat pain, fevers, aches and pains. Hydrate very well with at least 2 liters of water. Eat light meals such as soups (chicken and noodles, vegetable, chicken and wild rice).  Do not eat foods that you are allergic to.  Taking an antihistamine like Zyrtec can help against postnasal drainage, sinus congestion which can cause sinus pain, sinus headaches, throat pain, painful swallowing, coughing.  You can take this together with cough medication as needed.

## 2022-12-28 NOTE — ED Triage Notes (Addendum)
Pt c/o cough, head congestion, sinus drainage, sore throat-sx started 8 days ago-NAD-steady gait-pt denies STD exposure that was noted as c/o upon arrival

## 2022-12-28 NOTE — ED Provider Notes (Signed)
[facility]  Note:  This document was prepared using Conservation officer, historic buildings and may include unintentional dictation errors.  MRN: [id number] DOB: 03-01-1950  Subjective:   Ashley Woodard is a 72 y.o. female presenting for 9-day history of acute onset persistent sinus congestion, sinus drainage, sinus pain, bilateral ear fullness, throat pain, coughing.  No chest pain, shortness of breath or wheezing.  No smoking of any kind including cigarettes, cigars, vaping, marijuana use.  Has a history of sinus infections and allergic rhinitis, has tried numerous antihistamines.  Is compliant with the nasal spray.   Current Facility-Administered Medications:    botulinum toxin Type A (BOTOX) injection 100 Units, 100 Units, Intramuscular, Once, Levert Feinstein, MD  Current Outpatient Medications:    amLODipine (NORVASC) 10 MG tablet, TAKE 1 TABLET BY MOUTH EVERY DAY, Disp: 90 tablet, Rfl: 1   aspirin EC ([employer]S ASPIRIN LOW DOSE) 81 MG tablet, TAKE 1 TABLET (81 MG TOTAL) BY MOUTH DAILY. SWALLOW WHOLE., Disp: 90 tablet, Rfl: 3   atorvastatin (LIPITOR) 10 MG tablet, TAKE 1 TABLET BY MOUTH EVERY DAY, Disp: 90 tablet, Rfl: 3   Blood Glucose Calibration (ONETOUCH VERIO) SOLN, 1 Act by In Vitro route in the morning and at bedtime., Disp: 1 each, Rfl: 5   Blood Glucose Monitoring Suppl (ONETOUCH VERIO FLEX SYSTEM) w/Device KIT, 1 Act by Does not apply route in the morning and at bedtime., Disp: 1 kit, Rfl: 3   dapagliflozin propanediol (FARXIGA) 10 MG TABS tablet, Take 1 tablet (10 mg total) by mouth daily before breakfast., Disp: 90 tablet, Rfl: 1   diphenhydrAMINE HCl, Sleep, (SLEEP-AID) 50 MG CAPS, Take 50 mg by mouth at bedtime., Disp: , Rfl:    DULoxetine (CYMBALTA) 60 MG capsule, TAKE 1 CAPSULE BY MOUTH EVERY DAY, Disp: 90 capsule, Rfl: 3   fluticasone (FLONASE) 50 MCG/ACT nasal spray, Place 1 spray into both nostrils daily. Begin by using 2 sprays in each nare daily for 3 to 5 days,  then decrease to 1 spray in each nare daily., Disp: 31.6 mL, Rfl: 1   glucose blood (ONETOUCH VERIO) test strip, 1 each by Other route in the morning and at bedtime. Use as instructed, Disp: 100 each, Rfl: 12   hydrALAZINE (APRESOLINE) 50 MG tablet, TAKE 1 TABLET BY MOUTH TWICE A DAY, Disp: 180 tablet, Rfl: 0   irbesartan (AVAPRO) 300 MG tablet, TAKE 1 TABLET BY MOUTH EVERY DAY, Disp: 90 tablet, Rfl: 0   Lancets (ONETOUCH ULTRASOFT) lancets, 1 each by Other route in the morning and at bedtime. Use as instructed, Disp: 100 each, Rfl: 5   levothyroxine (SYNTHROID) 75 MCG tablet, TAKE 1 TABLET BY MOUTH EVERY DAY, Disp: 90 tablet, Rfl: 3   metFORMIN (GLUCOPHAGE) 500 MG tablet, TAKE 1 TABLET BY MOUTH TWICE A DAY WITH FOOD, Disp: 180 tablet, Rfl: 0   metoprolol tartrate (LOPRESSOR) 25 MG tablet, Take 1 tablet (25 mg total) by mouth 2 (two) times daily., Disp: 180 tablet, Rfl: 3   torsemide (DEMADEX) 20 MG tablet, TAKE 1 TABLET BY MOUTH EVERY DAY, Disp: 90 tablet, Rfl: 0   Allergies  Allergen Reactions   Amlodipine Swelling    Ankle edema   Demeclocycline Rash   Doxycycline Rash   Erythromycin Rash   Lisinopril Cough   Tetracyclines & Related Rash    Past Medical History:  Diagnosis Date   Abnormal cardiovascular stress test 08/21/2016   a. done for abnl EKG/cardiac risk factors -> admitted for cath  07/2016 which showed no significant CAD, normal LV contraction, mildly elevated filling pressure. Low dose Imdur was added for possible component of microvascular dysfunction.    Allergy    Anxiety    Arthritis    "back, knees, fingers" (05/10/2017)   Chronic lower back pain    Colon polyps    Deafness in right ear    Depression    Diverticulosis    Hx of adenomatous colonic polyps 05/08/2003   Hypertension    Hypertriglyceridemia    Hypothyroidism    Migraine    "none in the 2000s" (05/10/2017)   Osteoporosis    Paroxysmal atrial fibrillation (HCC)    Pneumonia    "several times"  (05/10/2017)   PONV (postoperative nausea and vomiting)    Presence of Watchman left atrial appendage closure device 03/04/2022   s/p LAAO with a 31 mm Watchman FLX by Dr. Lalla Brothers   S/P cardiac catheterization, 08/20/16 minimal CAD 08/21/2016   Type II diabetes mellitus (HCC)    Voice tremor      Past Surgical History:  Procedure Laterality Date   ABDOMINAL HYSTERECTOMY  1988   ATRIAL FIBRILLATION ABLATION N/A 05/10/2017   Procedure: ATRIAL FIBRILLATION ABLATION;  Surgeon: Hillis Range, MD;  Location: [facility];  Service: Cardiovascular;  Laterality: N/A;   COCHLEAR IMPLANT Right 2008   COLONOSCOPY     LEFT ATRIAL APPENDAGE OCCLUSION N/A 03/04/2022   Procedure: LEFT ATRIAL APPENDAGE OCCLUSION;  Surgeon: Lanier Prude, MD;  Location: [facility];  Service: Cardiovascular;  Laterality: N/A;   LEFT HEART CATH AND CORONARY ANGIOGRAPHY N/A 08/20/2016   Procedure: Left Heart Cath and Coronary Angiography;  Surgeon: Yvonne Kendall, MD;  Location: [facility];  Service: Cardiovascular;  Laterality: N/A;   LOOP RECORDER INSERTION N/A 11/29/2017   Procedure: LOOP RECORDER INSERTION;  Surgeon: Hillis Range, MD;  Location: [facility];  Service: Cardiovascular;  Laterality: N/A;   ORIF ANKLE FRACTURE Right 02/06/2019   Procedure: OPEN REDUCTION INTERNAL FIXATION (ORIF) ANKLE FRACTURE LATERAL MALLEOLUS, SYNDESMOSIS, RIGHT;  Surgeon: Terance Hart, MD;  Location: [facility];  Service: Orthopedics;  Laterality: Right;  SURGERY REQUEST TIME: 1.5 HOURS  CPT CODES: 82956, 27829, 21308, 65784   TEE WITHOUT CARDIOVERSION N/A 03/04/2022   Procedure: TRANSESOPHAGEAL ECHOCARDIOGRAM (TEE);  Surgeon: Lanier Prude, MD;  Location: [facility];  Service: Cardiovascular;  Laterality: N/A;   watchmen procedure     02/2022    Family History  Problem Relation Age of Onset   Hypertension Mother    Diabetes Mother    Heart attack Mother     Colon polyps Mother    Hypertension Father    Heart attack Father    Stroke Father    Breast cancer Sister 77   Breast cancer Maternal Aunt        in 30's   Breast cancer Maternal Aunt    Cancer Maternal Uncle        Lung   Hypertension Brother    Cancer Brother        Prostate   Diabetes Brother    Dementia Brother    Stroke Brother    Heart attack Brother    Prostate cancer Brother    Hypertension Son    Colon cancer Neg Hx    Esophageal cancer Neg Hx    Rectal cancer Neg Hx    Stomach cancer Neg Hx     Social History  Tobacco Use   Smoking status: Never   Smokeless tobacco: Never  Vaping Use   Vaping status: Never Used  Substance Use Topics   Alcohol use: No   Drug use: No    ROS   Objective:   Vitals: BP (!) 151/79 (BP Location: Right Arm)   Pulse (!) 54   Temp 98.4 F (36.9 C) (Oral)   Resp 20   SpO2 95%   Physical Exam Constitutional:      General: She is not in acute distress.    Appearance: Normal appearance. She is well-developed and normal weight. She is not ill-appearing, toxic-appearing or diaphoretic.  HENT:     Head: Normocephalic and atraumatic.     Right Ear: Ear canal and external ear normal. No drainage or tenderness. No middle ear effusion. There is no impacted cerumen. Tympanic membrane is not erythematous or bulging.     Left Ear: Ear canal and external ear normal. No drainage or tenderness.  No middle ear effusion. There is no impacted cerumen. Tympanic membrane is not erythematous or bulging.     Ears:     Comments: Bilateral ear effusions.    Nose: Congestion present. No rhinorrhea.     Mouth/Throat:     Mouth: Mucous membranes are moist. No oral lesions.     Pharynx: No pharyngeal swelling, oropharyngeal exudate, posterior oropharyngeal erythema or uvula swelling.     Tonsils: No tonsillar exudate or tonsillar abscesses.  Eyes:     General: No scleral icterus.       Right eye: No discharge.        Left eye: No discharge.      Extraocular Movements: Extraocular movements intact.     Right eye: Normal extraocular motion.     Left eye: Normal extraocular motion.     Conjunctiva/sclera: Conjunctivae normal.  Cardiovascular:     Rate and Rhythm: Normal rate and regular rhythm.     Heart sounds: Normal heart sounds. No murmur heard.    No friction rub. No gallop.  Pulmonary:     Effort: Pulmonary effort is normal. No respiratory distress.     Breath sounds: No stridor. No wheezing, rhonchi or rales.  Chest:     Chest wall: No tenderness.  Musculoskeletal:     Cervical back: Normal range of motion and neck supple.  Lymphadenopathy:     Cervical: No cervical adenopathy.  Skin:    General: Skin is warm and dry.  Neurological:     General: No focal deficit present.     Mental Status: She is alert and oriented to person, place, and time.  Psychiatric:        Mood and Affect: Mood normal.        Behavior: Behavior normal.     Assessment and Plan :   PDMP not reviewed this encounter.  1. Other acute recurrent sinusitis   2. Allergic rhinitis, unspecified seasonality, unspecified trigger    Recommend amoxicillin for recurrent sinusitis.  Hold Flonase.  Use supportive care otherwise.  Deferred imaging given clear cardiopulmonary exam, hemodynamically stable vital signs.  Counseled patient on potential for adverse effects with medications prescribed/recommended today, ER and return-to-clinic precautions discussed, patient verbalized understanding.    Wallis Bamberg, [place] 12/28/22 [id number]

## 2023-01-01 ENCOUNTER — Other Ambulatory Visit: Payer: Self-pay | Admitting: Family Medicine

## 2023-01-01 DIAGNOSIS — E119 Type 2 diabetes mellitus without complications: Secondary | ICD-10-CM

## 2023-01-05 ENCOUNTER — Ambulatory Visit
Admission: RE | Admit: 2023-01-05 | Discharge: 2023-01-05 | Disposition: A | Discharge status: Home / Self Care | Payer: Medicare Other | Source: Ambulatory Visit | Attending: Family Medicine | Admitting: Family Medicine

## 2023-01-05 DIAGNOSIS — Z1231 Encounter for screening mammogram for malignant neoplasm of breast: Secondary | ICD-10-CM

## 2023-02-03 ENCOUNTER — Ambulatory Visit: Payer: Medicare Other | Admitting: Family Medicine

## 2023-02-03 ENCOUNTER — Ambulatory Visit
Admission: RE | Admit: 2023-02-03 | Discharge: 2023-02-03 | Disposition: A | Discharge status: Home / Self Care | Payer: Medicare Other | Source: Ambulatory Visit | Attending: Internal Medicine | Admitting: Internal Medicine

## 2023-02-03 VITALS — BP 155/78 | HR 56 | Temp 97.8°F | Resp 20

## 2023-02-03 DIAGNOSIS — J309 Allergic rhinitis, unspecified: Secondary | ICD-10-CM | POA: Diagnosis not present

## 2023-02-03 DIAGNOSIS — J329 Chronic sinusitis, unspecified: Secondary | ICD-10-CM

## 2023-02-03 MED ORDER — PREDNISONE 20 MG PO TABS: ORAL_TABLET | ORAL | 0 refills | Status: DC

## 2023-02-03 MED ORDER — CETIRIZINE HCL 10 MG PO TABS: 10.0000 mg | ORAL_TABLET | Freq: Every day | ORAL | 0 refills | Status: DC

## 2023-02-03 NOTE — ED Provider Notes (Signed)
[facility]  Note:  This document was prepared using Conservation officer, historic buildings and may include unintentional dictation errors.  MRN: [id number] DOB: 1950-10-24  Subjective:   Ashley Woodard is a 72 y.o. female presenting for 1 week history of recurrent sinus congestion, sinus drainage, coughing, sneezing.  Patient has a history of allergic rhinitis.  She was seen a month ago and completed a 10-day course of amoxicillin.  She did not achieve full resolution of her symptoms.  Patient states that she usually does better with an oral prednisone course.   Current Facility-Administered Medications:    botulinum toxin Type A (BOTOX) injection 100 Units, 100 Units, Intramuscular, Once, Levert Feinstein, MD  Current Outpatient Medications:    amLODipine (NORVASC) 10 MG tablet, TAKE 1 TABLET BY MOUTH EVERY DAY, Disp: 90 tablet, Rfl: 1   amoxicillin (AMOXIL) 875 MG tablet, Take 1 tablet (875 mg total) by mouth 2 (two) times daily., Disp: 14 tablet, Rfl: 0   aspirin EC ([employer] ASPIRIN LOW DOSE) 81 MG tablet, TAKE 1 TABLET (81 MG TOTAL) BY MOUTH DAILY. SWALLOW WHOLE., Disp: 90 tablet, Rfl: 3   atorvastatin (LIPITOR) 10 MG tablet, TAKE 1 TABLET BY MOUTH EVERY DAY, Disp: 90 tablet, Rfl: 3   Blood Glucose Calibration (ONETOUCH VERIO) SOLN, 1 Act by In Vitro route in the morning and at bedtime., Disp: 1 each, Rfl: 5   Blood Glucose Monitoring Suppl (ONETOUCH VERIO FLEX SYSTEM) w/Device KIT, 1 Act by Does not apply route in the morning and at bedtime., Disp: 1 kit, Rfl: 3   cetirizine (ZYRTEC ALLERGY) 10 MG tablet, Take 1 tablet (10 mg total) by mouth daily., Disp: 30 tablet, Rfl: 0   dapagliflozin propanediol (FARXIGA) 10 MG TABS tablet, Take 1 tablet (10 mg total) by mouth daily before breakfast., Disp: 90 tablet, Rfl: 1   diphenhydrAMINE HCl, Sleep, (SLEEP-AID) 50 MG CAPS, Take 50 mg by mouth at bedtime., Disp: , Rfl:    DULoxetine (CYMBALTA) 60 MG capsule, TAKE 1 CAPSULE BY MOUTH EVERY  DAY, Disp: 90 capsule, Rfl: 3   fluticasone (FLONASE) 50 MCG/ACT nasal spray, Place 1 spray into both nostrils daily. Begin by using 2 sprays in each nare daily for 3 to 5 days, then decrease to 1 spray in each nare daily., Disp: 31.6 mL, Rfl: 1   glucose blood (ONETOUCH VERIO) test strip, 1 each by Other route in the morning and at bedtime. Use as instructed, Disp: 100 each, Rfl: 12   hydrALAZINE (APRESOLINE) 50 MG tablet, TAKE 1 TABLET BY MOUTH TWICE A DAY, Disp: 180 tablet, Rfl: 0   irbesartan (AVAPRO) 300 MG tablet, TAKE 1 TABLET BY MOUTH EVERY DAY, Disp: 90 tablet, Rfl: 0   Lancets (ONETOUCH ULTRASOFT) lancets, 1 each by Other route in the morning and at bedtime. Use as instructed, Disp: 100 each, Rfl: 5   levothyroxine (SYNTHROID) 75 MCG tablet, TAKE 1 TABLET BY MOUTH EVERY DAY, Disp: 90 tablet, Rfl: 3   metFORMIN (GLUCOPHAGE) 500 MG tablet, TAKE 1 TABLET BY MOUTH TWICE A DAY WITH FOOD, Disp: 180 tablet, Rfl: 0   metoprolol tartrate (LOPRESSOR) 25 MG tablet, Take 1 tablet (25 mg total) by mouth 2 (two) times daily., Disp: 180 tablet, Rfl: 3   promethazine-dextromethorphan (PROMETHAZINE-DM) 6.25-15 MG/5ML syrup, Take 5 mLs by mouth at bedtime as needed for cough., Disp: 100 mL, Rfl: 0   torsemide (DEMADEX) 20 MG tablet, TAKE 1 TABLET BY MOUTH EVERY DAY, Disp: 90 tablet, Rfl: 0  Allergies  Allergen Reactions   Amlodipine Swelling    Ankle edema   Demeclocycline Rash   Doxycycline Rash   Erythromycin Rash   Lisinopril Cough   Tetracyclines & Related Rash    Past Medical History:  Diagnosis Date   Abnormal cardiovascular stress test 08/21/2016   a. done for abnl EKG/cardiac risk factors -> admitted for cath 07/2016 which showed no significant CAD, normal LV contraction, mildly elevated filling pressure. Low dose Imdur was added for possible component of microvascular dysfunction.    Allergy    Anxiety    Arthritis    "back, knees, fingers" (05/10/2017)   Chronic lower back pain     Colon polyps    Deafness in right ear    Depression    Diverticulosis    Hx of adenomatous colonic polyps 05/08/2003   Hypertension    Hypertriglyceridemia    Hypothyroidism    Migraine    "none in the 2000s" (05/10/2017)   Osteoporosis    Paroxysmal atrial fibrillation (HCC)    Pneumonia    "several times" (05/10/2017)   PONV (postoperative nausea and vomiting)    Presence of Watchman left atrial appendage closure device 03/04/2022   s/p LAAO with a 31 mm Watchman FLX by Dr. Lalla Brothers   S/P cardiac catheterization, 08/20/16 minimal CAD 08/21/2016   Type II diabetes mellitus (HCC)    Voice tremor      Past Surgical History:  Procedure Laterality Date   ABDOMINAL HYSTERECTOMY  1988   ATRIAL FIBRILLATION ABLATION N/A 05/10/2017   Procedure: ATRIAL FIBRILLATION ABLATION;  Surgeon: Hillis Range, MD;  Location: [facility];  Service: Cardiovascular;  Laterality: N/A;   COCHLEAR IMPLANT Right 2008   COLONOSCOPY     LEFT ATRIAL APPENDAGE OCCLUSION N/A 03/04/2022   Procedure: LEFT ATRIAL APPENDAGE OCCLUSION;  Surgeon: Lanier Prude, MD;  Location: [facility];  Service: Cardiovascular;  Laterality: N/A;   LEFT HEART CATH AND CORONARY ANGIOGRAPHY N/A 08/20/2016   Procedure: Left Heart Cath and Coronary Angiography;  Surgeon: Yvonne Kendall, MD;  Location: [facility];  Service: Cardiovascular;  Laterality: N/A;   LOOP RECORDER INSERTION N/A 11/29/2017   Procedure: LOOP RECORDER INSERTION;  Surgeon: Hillis Range, MD;  Location: [facility];  Service: Cardiovascular;  Laterality: N/A;   ORIF ANKLE FRACTURE Right 02/06/2019   Procedure: OPEN REDUCTION INTERNAL FIXATION (ORIF) ANKLE FRACTURE LATERAL MALLEOLUS, SYNDESMOSIS, RIGHT;  Surgeon: Terance Hart, MD;  Location: [facility];  Service: Orthopedics;  Laterality: Right;  SURGERY REQUEST TIME: 1.5 HOURS  CPT CODES: 16109, 27829, 60454, 09811   TEE WITHOUT CARDIOVERSION N/A 03/04/2022    Procedure: TRANSESOPHAGEAL ECHOCARDIOGRAM (TEE);  Surgeon: Lanier Prude, MD;  [facility];  Service: Cardiovascular;  Laterality: N/A;   watchmen procedure     02/2022    Family History  Problem Relation Age of Onset   Hypertension Mother    Diabetes Mother    Heart attack Mother    Colon polyps Mother    Hypertension Father    Heart attack Father    Stroke Father    Breast cancer Sister 72   Breast cancer Maternal Aunt        in 30's   Breast cancer Maternal Aunt 43 - 59   Lung cancer Maternal Uncle    Hypertension Brother    Diabetes Brother    Dementia Brother    Stroke Brother    Heart attack  Brother    Prostate cancer Brother    Hypertension Son    Colon cancer Neg Hx    Esophageal cancer Neg Hx    Rectal cancer Neg Hx    Stomach cancer Neg Hx     Social History   Tobacco Use   Smoking status: Never   Smokeless tobacco: Never  Vaping Use   Vaping status: Never Used  Substance Use Topics   Alcohol use: No   Drug use: No    ROS   Objective:   Vitals: BP (!) 155/78 (BP Location: Right Arm)   Pulse (!) 56   Temp 97.8 F (36.6 C) (Oral)   Resp 20   SpO2 96%   Physical Exam Constitutional:      General: She is not in acute distress.    Appearance: Normal appearance. She is well-developed and normal weight. She is not ill-appearing, toxic-appearing or diaphoretic.  HENT:     Head: Normocephalic and atraumatic.     Right Ear: Tympanic membrane, ear canal and external ear normal. No drainage or tenderness. No middle ear effusion. There is no impacted cerumen. Tympanic membrane is not erythematous or bulging.     Left Ear: Tympanic membrane, ear canal and external ear normal. No drainage or tenderness.  No middle ear effusion. There is no impacted cerumen. Tympanic membrane is not erythematous or bulging.     Nose: Congestion and rhinorrhea present.     Mouth/Throat:     Mouth: Mucous membranes are moist. No oral lesions.      Pharynx: No pharyngeal swelling, oropharyngeal exudate, posterior oropharyngeal erythema or uvula swelling.     Tonsils: No tonsillar exudate or tonsillar abscesses.  Eyes:     General: No scleral icterus.       Right eye: No discharge.        Left eye: No discharge.     Extraocular Movements: Extraocular movements intact.     Right eye: Normal extraocular motion.     Left eye: Normal extraocular motion.     Conjunctiva/sclera: Conjunctivae normal.  Cardiovascular:     Rate and Rhythm: Normal rate and regular rhythm.     Heart sounds: Normal heart sounds. No murmur heard.    No friction rub. No gallop.  Pulmonary:     Effort: Pulmonary effort is normal. No respiratory distress.     Breath sounds: No stridor. No wheezing, rhonchi or rales.  Chest:     Chest wall: No tenderness.  Musculoskeletal:     Cervical back: Normal range of motion and neck supple.  Lymphadenopathy:     Cervical: No cervical adenopathy.  Skin:    General: Skin is warm and dry.  Neurological:     General: No focal deficit present.     Mental Status: She is alert and oriented to person, place, and time.  Psychiatric:        Mood and Affect: Mood normal.        Behavior: Behavior normal.     Assessment and Plan :   PDMP not reviewed this encounter.  1. Chronic recurrent sinusitis   2. Allergic rhinitis, unspecified seasonality, unspecified trigger    Will defer repeat antibiotics that she just underwent a course.  Recommended an oral prednisone course to address chronic sinusitis.  Patient has typically done better with this as well.  Deferred imaging given clear cardiopulmonary exam, hemodynamically stable vital signs.  Counseled patient on potential for adverse effects with medications prescribed/recommended today, ER and  return-to-clinic precautions discussed, patient verbalized understanding.    Wallis Bamberg, PA-C 02/03/23 1745

## 2023-02-03 NOTE — ED Triage Notes (Signed)
Pt c/o nasal congestion/drainage and coughing at night-sx x 1 weeks-states feels like a sinus infection-using nasal spray and cough syrup-NAD-steady gait

## 2023-02-03 NOTE — Discharge Instructions (Signed)
Please start prednisone for your current sinusitis flare. Take this for 5 days. Take Zyrtec the rest of this month.

## 2023-02-21 ENCOUNTER — Ambulatory Visit (INDEPENDENT_AMBULATORY_CARE_PROVIDER_SITE_OTHER): Payer: Medicare Other | Admitting: Family Medicine

## 2023-02-21 ENCOUNTER — Encounter: Payer: Self-pay | Admitting: Family Medicine

## 2023-02-21 VITALS — BP 122/72 | HR 64 | Temp 97.6°F | Ht 67.0 in | Wt 173.6 lb

## 2023-02-21 DIAGNOSIS — E7800 Pure hypercholesterolemia, unspecified: Secondary | ICD-10-CM

## 2023-02-21 DIAGNOSIS — N1832 Chronic kidney disease, stage 3b: Secondary | ICD-10-CM | POA: Diagnosis not present

## 2023-02-21 DIAGNOSIS — E039 Hypothyroidism, unspecified: Secondary | ICD-10-CM

## 2023-02-21 DIAGNOSIS — N1831 Chronic kidney disease, stage 3a: Secondary | ICD-10-CM

## 2023-02-21 DIAGNOSIS — R7303 Prediabetes: Secondary | ICD-10-CM

## 2023-02-21 LAB — URINALYSIS, ROUTINE W REFLEX MICROSCOPIC
Bilirubin Urine: NEGATIVE
Hgb urine dipstick: NEGATIVE
Ketones, ur: NEGATIVE
Nitrite: NEGATIVE
RBC / HPF: NONE SEEN (ref 0–?)
Specific Gravity, Urine: 1.01 (ref 1.000–1.030)
Total Protein, Urine: NEGATIVE
Urine Glucose: NEGATIVE
Urobilinogen, UA: 0.2 (ref 0.0–1.0)
pH: 6 (ref 5.0–8.0)

## 2023-02-21 LAB — HEMOGLOBIN A1C: Hgb A1c MFr Bld: 6.6 % — ABNORMAL HIGH (ref 4.6–6.5)

## 2023-02-21 LAB — BASIC METABOLIC PANEL
BUN: 27 mg/dL — ABNORMAL HIGH (ref 6–23)
CO2: 26 meq/L (ref 19–32)
Calcium: 9.3 mg/dL (ref 8.4–10.5)
Chloride: 105 meq/L (ref 96–112)
Creatinine, Ser: 1.64 mg/dL — ABNORMAL HIGH (ref 0.40–1.20)
GFR: 31.24 mL/min — ABNORMAL LOW (ref >60.00)
Glucose, Bld: 115 mg/dL — ABNORMAL HIGH (ref 70–99)
Potassium: 3.5 meq/L (ref 3.5–5.1)
Sodium: 142 meq/L (ref 135–145)

## 2023-02-21 LAB — LIPID PANEL
Cholesterol: 132 mg/dL (ref 0–200)
HDL: 53.3 mg/dL (ref >39.00)
LDL Cholesterol: 58 mg/dL (ref 0–99)
NonHDL: 79.14
Total CHOL/HDL Ratio: 2
Triglycerides: 105 mg/dL (ref 0.0–149.0)
VLDL: 21 mg/dL (ref 0.0–40.0)

## 2023-02-21 LAB — TSH: TSH: 3.43 u[IU]/mL (ref 0.35–5.50)

## 2023-02-21 LAB — MICROALBUMIN / CREATININE URINE RATIO
Creatinine,U: 90.3 mg/dL
Microalb Creat Ratio: 1.2 mg/g (ref 0.0–30.0)
Microalb, Ur: 1.1 mg/dL (ref 0.0–1.9)

## 2023-02-21 NOTE — Addendum Note (Signed)
Addended by: Andrez Grime on: 02/21/2023 05:14 PM   Modules accepted: Orders

## 2023-02-21 NOTE — Progress Notes (Addendum)
Established Patient Office Visit   Subjective:  Patient ID: Ashley Woodard, female    DOB: 11/25/1950  Age: 72 y.o. MRN: [id number]  Chief Complaint  Patient presents with   Medical Management of Chronic Issues    3 month follow up. Pt is fasting. No concerns or questions.     HPI Encounter Diagnoses  Name Primary?   Prediabetes Yes   Elevated LDL cholesterol level    Stage 3a chronic kidney disease (HCC)    Hypothyroidism, unspecified type    Stage 3b chronic kidney disease (HCC)    For follow-up of above.  Doing well with the [place].  Has not taken it in 10 days because she is switching insurances.  Hydrating well.  Continues low-dose atorvastatin for elevated ldl cholesterol.  Continues levothyroxine 75 mcg daily for hypothyroidism.  No regular exercise at this point but is planning on returning to the gym.  Retired and lives at home with her husband.   Review of Systems  Constitutional: Negative.   HENT: Negative.    Eyes:  Negative for blurred vision, discharge and redness.  Respiratory: Negative.    Cardiovascular: Negative.   Gastrointestinal:  Negative for abdominal pain.  Genitourinary: Negative.   Musculoskeletal: Negative.  Negative for myalgias.  Skin:  Negative for rash.  Neurological:  Negative for tingling, loss of consciousness and weakness.  Endo/Heme/Allergies:  Negative for polydipsia.     Current Outpatient Medications:    amLODipine (NORVASC) 10 MG tablet, TAKE 1 TABLET BY MOUTH EVERY DAY, Disp: 90 tablet, Rfl: 1   aspirin EC ([employer] ASPIRIN LOW DOSE) 81 MG tablet, TAKE 1 TABLET (81 MG TOTAL) BY MOUTH DAILY. SWALLOW WHOLE., Disp: 90 tablet, Rfl: 3   atorvastatin (LIPITOR) 10 MG tablet, TAKE 1 TABLET BY MOUTH EVERY DAY, Disp: 90 tablet, Rfl: 3   Blood Glucose Calibration (ONETOUCH VERIO) SOLN, 1 Act by In Vitro route in the morning and at bedtime., Disp: 1 each, Rfl: 5   Blood Glucose Monitoring Suppl (ONETOUCH VERIO FLEX SYSTEM) w/Device KIT, 1 Act by Does  not apply route in the morning and at bedtime., Disp: 1 kit, Rfl: 3   dapagliflozin propanediol (FARXIGA) 10 MG TABS tablet, Take 1 tablet (10 mg total) by mouth daily before breakfast., Disp: 90 tablet, Rfl: 1   diphenhydrAMINE HCl, Sleep, (SLEEP-AID) 50 MG CAPS, Take 50 mg by mouth at bedtime., Disp: , Rfl:    DULoxetine (CYMBALTA) 60 MG capsule, TAKE 1 CAPSULE BY MOUTH EVERY DAY, Disp: 90 capsule, Rfl: 3   glucose blood (ONETOUCH VERIO) test strip, 1 each by Other route in the morning and at bedtime. Use as instructed, Disp: 100 each, Rfl: 12   hydrALAZINE (APRESOLINE) 50 MG tablet, TAKE 1 TABLET BY MOUTH TWICE A DAY, Disp: 180 tablet, Rfl: 0   irbesartan (AVAPRO) 300 MG tablet, TAKE 1 TABLET BY MOUTH EVERY DAY, Disp: 90 tablet, Rfl: 0   Lancets (ONETOUCH ULTRASOFT) lancets, 1 each by Other route in the morning and at bedtime. Use as instructed, Disp: 100 each, Rfl: 5   levothyroxine (SYNTHROID) 75 MCG tablet, TAKE 1 TABLET BY MOUTH EVERY DAY, Disp: 90 tablet, Rfl: 3   metFORMIN (GLUCOPHAGE) 500 MG tablet, TAKE 1 TABLET BY MOUTH TWICE A DAY WITH FOOD, Disp: 180 tablet, Rfl: 0   torsemide (DEMADEX) 20 MG tablet, TAKE 1 TABLET BY MOUTH EVERY DAY, Disp: 90 tablet, Rfl: 0   metoprolol tartrate (LOPRESSOR) 25 MG tablet, Take 1 tablet (25 mg total) by mouth  2 (two) times daily., Disp: 180 tablet, Rfl: 3  Current Facility-Administered Medications:    botulinum toxin Type A (BOTOX) injection 100 Units, 100 Units, Intramuscular, Once, Levert Feinstein, MD   Objective:     BP 122/72   Pulse 64   Temp 97.6 F (36.4 C)   Ht 5\' 7"  (1.702 m)   Wt 173 lb 9.6 oz (78.7 kg)   SpO2 98%   BMI 27.19 kg/m    Physical Exam Constitutional:      General: She is not in acute distress.    Appearance: Normal appearance. She is not ill-appearing, toxic-appearing or diaphoretic.  HENT:     Head: Normocephalic and atraumatic.     Right Ear: External ear normal.     Left Ear: External ear normal.  Eyes:      General: No scleral icterus.       Right eye: No discharge.        Left eye: No discharge.     Extraocular Movements: Extraocular movements intact.     Conjunctiva/sclera: Conjunctivae normal.  Pulmonary:     Effort: Pulmonary effort is normal. No respiratory distress.  Skin:    General: Skin is warm and dry.  Neurological:     Mental Status: She is alert and oriented to person, place, and time.  Psychiatric:        Mood and Affect: Mood normal.        Behavior: Behavior normal.      Results for orders placed or performed in visit on 02/21/23  Basic metabolic panel  Result Value Ref Range   Sodium 142 135 - 145 mEq/L   Potassium 3.5 3.5 - 5.1 mEq/L   Chloride 105 96 - 112 mEq/L   CO2 26 19 - 32 mEq/L   Glucose, Bld 115 (H) 70 - 99 mg/dL   BUN 27 (H) 6 - 23 mg/dL   Creatinine, Ser 9.62 (H) 0.40 - 1.20 mg/dL   GFR 95.28 (L) >41.32 mL/min   Calcium 9.3 8.4 - 10.5 mg/dL  TSH  Result Value Ref Range   TSH 3.43 0.35 - 5.50 uIU/mL  Lipid panel  Result Value Ref Range   Cholesterol 132 0 - 200 mg/dL   Triglycerides 440.1 0.0 - 149.0 mg/dL   HDL 02.72 >53.66 mg/dL   VLDL 44.0 0.0 - 34.7 mg/dL   LDL Cholesterol 58 0 - 99 mg/dL   Total CHOL/HDL Ratio 2    NonHDL 79.14   Hemoglobin A1c  Result Value Ref Range   Hgb A1c MFr Bld 6.6 (H) 4.6 - 6.5 %  Urinalysis, Routine w reflex microscopic  Result Value Ref Range   Color, Urine YELLOW Yellow;Lt. Yellow;Straw;Dark Yellow;Amber;Green;Red;Brown   APPearance Sl Cloudy (A) Clear;Turbid;Slightly Cloudy;Cloudy   Specific Gravity, Urine 1.010 1.000 - 1.030   pH 6.0 5.0 - 8.0   Total Protein, Urine NEGATIVE Negative   Urine Glucose NEGATIVE Negative   Ketones, ur NEGATIVE Negative   Bilirubin Urine NEGATIVE Negative   Hgb urine dipstick NEGATIVE Negative   Urobilinogen, UA 0.2 0.0 - 1.0   Leukocytes,Ua TRACE (A) Negative   Nitrite NEGATIVE Negative   WBC, UA 11-20/hpf (A) 0-2/hpf   RBC / HPF none seen 0-2/hpf   Squamous  Epithelial / HPF Rare(0-4/hpf) Rare(0-4/hpf)   Bacteria, UA Few(10-50/hpf) (A) None  Microalbumin / creatinine urine ratio  Result Value Ref Range   Microalb, Ur 1.1 0.0 - 1.9 mg/dL   Creatinine,U 42.5 mg/dL   Microalb Creat Ratio 1.2  0.0 - 30.0 mg/g      The 10-year ASCVD risk score (Arnett DK, et al., 2019) is: 21.2%    Assessment & Plan:   Prediabetes -     Basic metabolic panel -     Hemoglobin A1c -     Urinalysis, Routine w reflex microscopic -     Microalbumin / creatinine urine ratio  Elevated LDL cholesterol level -     Lipid panel  Stage 3a chronic kidney disease (HCC) -     Basic metabolic panel -     Urinalysis, Routine w reflex microscopic -     Microalbumin / creatinine urine ratio  Hypothyroidism, unspecified type -     TSH  Stage 3b chronic kidney disease (HCC) -     Ambulatory referral to Nephrology    Return in about 6 months (around 08/22/2023), or if symptoms worsen or fail to improve.  Continue all medications as above.  Adjustments made pending results of labs.  Information given on exercising to stay healthy.   Mliss Sax, MD

## 2023-02-24 ENCOUNTER — Other Ambulatory Visit: Payer: Self-pay | Admitting: Family Medicine

## 2023-02-24 DIAGNOSIS — I10 Essential (primary) hypertension: Secondary | ICD-10-CM

## 2023-03-05 ENCOUNTER — Other Ambulatory Visit: Payer: Self-pay | Admitting: Family Medicine

## 2023-03-05 DIAGNOSIS — I10 Essential (primary) hypertension: Secondary | ICD-10-CM

## 2023-03-07 ENCOUNTER — Ambulatory Visit: Payer: Medicare Other

## 2023-03-07 NOTE — Progress Notes (Signed)
 Virtual Visit via Telephone Note   Because of Kindell Strada co-morbid illnesses, she is at least at moderate risk for complications without adequate follow up.  This format is felt to be most appropriate for this patient at this time.  The patient did not have access to video technology/had technical difficulties with video requiring transitioning to audio format only (telephone).  All issues noted in this document were discussed and addressed.  No physical exam could be performed with this format.  Please refer to the patient's chart for her consent to telehealth for [facility].   Date:  03/07/2023   ID:  Levon Galt, DOB Jan 03, 1951, MRN [id number] The patient was identified using 2 identifiers.  Patient Location: Home Provider Location: Office/Clinic  PCP:  [name], MD   [facility] Providers Cardiologist:  Danelle Birmingham, MD Electrophysiologist:  OLE ONEIDA HOLTS, MD {  Evaluation Performed:  Follow-Up Visit  Chief Complaint:  1 year s/p LAAO   History of Present Illness:    Zen Felling is a 73 y.o. female with a hx of paroxysmal atrial fibrillation on Coumadin  s/p AF ablation 2019, HTN, and DM2 who was contacted today for 1 year post Watchman follow up.     Ms. Lewey was seen as an outpatient and felt to be a poor candidate for long term anticoagulation. She underwent LAAO closure with a 27mm Watchman device 03/04/2022. She has done well post implant and is now one year out from implant. She was previously followed by Dr. Birmingham and is concerned today given a family hx of CAD and HTN that she needs to establish care with general cardiology. HTN managed mostly by PCP however patient prefers to additionally have input from cardiology. Otherwise she continues to do very well with no chest pain, SOB, palpitations, LE edema, orthopnea, PND, dizziness, or syncope. Denies bleeding in stool or urine.     Past Medical History:  Diagnosis Date   Abnormal  cardiovascular stress test 08/21/2016   a. done for abnl EKG/cardiac risk factors -> admitted for cath 07/2016 which showed no significant CAD, normal LV contraction, mildly elevated filling pressure. Low dose Imdur  was added for possible component of microvascular dysfunction.    Allergy    Anxiety    Arthritis    back, knees, fingers (05/10/2017)   Chronic lower back pain    Colon polyps    Deafness in right ear    Depression    Diverticulosis    Hx of adenomatous colonic polyps 05/08/2003   Hypertension    Hypertriglyceridemia    Hypothyroidism    Migraine    none in the 2000s (05/10/2017)   Osteoporosis    Paroxysmal atrial fibrillation (HCC)    Pneumonia    several times (05/10/2017)   PONV (postoperative nausea and vomiting)    Presence of Watchman left atrial appendage closure device 03/04/2022   s/p LAAO with a 31 mm Watchman FLX by Dr. Holts   S/P cardiac catheterization, 08/20/16 minimal CAD 08/21/2016   Type II diabetes mellitus (HCC)    Voice tremor    Past Surgical History:  Procedure Laterality Date   ABDOMINAL HYSTERECTOMY  1988   ATRIAL FIBRILLATION ABLATION N/A 05/10/2017   Procedure: ATRIAL FIBRILLATION ABLATION;  Surgeon: Kelsie Agent, MD;  Location: [facility] INVASIVE CV LAB;  Service: Cardiovascular;  Laterality: N/A;   COCHLEAR IMPLANT Right 2008   COLONOSCOPY     LEFT ATRIAL APPENDAGE OCCLUSION N/A 03/04/2022   Procedure: LEFT ATRIAL  APPENDAGE OCCLUSION;  Surgeon: Cindie Ole DASEN, MD;  Location: [facility] INVASIVE CV LAB;  Service: Cardiovascular;  Laterality: N/A;   LEFT HEART CATH AND CORONARY ANGIOGRAPHY N/A 08/20/2016   Procedure: Left Heart Cath and Coronary Angiography;  Surgeon: Mady Bruckner, MD;  Location: [facility] INVASIVE CV LAB;  Service: Cardiovascular;  Laterality: N/A;   LOOP RECORDER INSERTION N/A 11/29/2017   Procedure: LOOP RECORDER INSERTION;  Surgeon: Kelsie Agent, MD;  Location: [facility] INVASIVE CV LAB;  Service: Cardiovascular;  Laterality: N/A;    ORIF ANKLE FRACTURE Right 02/06/2019   Procedure: OPEN REDUCTION INTERNAL FIXATION (ORIF) ANKLE FRACTURE LATERAL MALLEOLUS, SYNDESMOSIS, RIGHT;  Surgeon: Elsa Bruckner SAUNDERS, MD;  Location: [facility];  Service: Orthopedics;  Laterality: Right;  SURGERY REQUEST TIME: 1.5 HOURS  CPT CODES: 72207, 27829, 72301, 72389   TEE WITHOUT CARDIOVERSION N/A 03/04/2022   Procedure: TRANSESOPHAGEAL ECHOCARDIOGRAM (TEE);  Surgeon: Cindie Ole DASEN, MD;  Location: [facility] INVASIVE CV LAB;  Service: Cardiovascular;  Laterality: N/A;   watchmen procedure     02/2022     No outpatient medications have been marked as taking for the 03/08/23 encounter (Appointment) with [facility].   Current Facility-Administered Medications for the 03/08/23 encounter (Appointment) with [facility]  Medication   botulinum toxin Type A  (BOTOX ) injection 100 Units     Allergies:   Amlodipine , Demeclocycline, Doxycycline, Erythromycin, Lisinopril, and Tetracyclines & related   Social History   Tobacco Use   Smoking status: Never   Smokeless tobacco: Never  Vaping Use   Vaping status: Never Used  Substance Use Topics   Alcohol use: No   Drug use: No     Family Hx: The patient's family history includes Breast cancer in her maternal aunt; Breast cancer (age of onset: 28 - 2) in her maternal aunt; Breast cancer (age of onset: 57) in her sister; Colon polyps in her mother; Dementia in her brother; Diabetes in her brother and mother; Heart attack in her brother, father, and mother; Hypertension in her brother, father, mother, and son; Lung cancer in her maternal uncle; Prostate cancer in her brother; Stroke in her brother and father. There is no history of Colon cancer, Esophageal cancer, Rectal cancer, or Stomach cancer.  ROS:   Please see the history of present illness.     All other systems reviewed and are negative.  Labs/Other Tests and Data Reviewed:    EKG:  No  ECG reviewed.  Recent Labs: 02/21/2023: BUN 27; Creatinine, Ser 1.64; Potassium 3.5; Sodium 142; TSH 3.43   Recent Lipid Panel Lab Results  Component Value Date/Time   CHOL 132 02/21/2023 08:54 AM   TRIG 105.0 02/21/2023 08:54 AM   HDL 53.30 02/21/2023 08:54 AM   CHOLHDL 2 02/21/2023 08:54 AM   LDLCALC 58 02/21/2023 08:54 AM   LDLCALC 41 01/05/2019 03:12 PM   LDLDIRECT 44.0 03/27/2021 08:12 AM    Wt Readings from Last 3 Encounters:  02/21/23 173 lb 9.6 oz (78.7 kg)  11/04/22 172 lb (78 kg)  10/04/22 169 lb (76.7 kg)       Objective:    Vital Signs:  There were no vitals taken for this visit.   GEN:  no acute distress  ASSESSMENT & PLAN:    PAF: s/p LAAO closure with Watchman 03/04/22. Continue ASA given hx of DM2, HLD, HTN, and family hx of CAD. Wishes to establish care with general cardiology. This has been scheduled with Dr. Santo 05/2023.  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 Remains asymptomatic at this time.    HTN: PCP follows closely. Most recent OV with BP of 122/72. No changes today.   Time:   Today, I have spent 15 minutes with the patient with telehealth technology discussing the above problems.     Medication Adjustments/Labs and Tests Ordered: Current medicines are reviewed at length with the patient today.  Concerns regarding medicines are outlined above.   Tests Ordered: No orders of the defined types were placed in this encounter.   Medication Changes: No orders of the defined types were placed in this encounter.   Follow Up:  In Person  05/2023 with Dr. Santo   Signed, Kate Minus, NP  03/07/2023 10:20 AM    [facility]

## 2023-03-08 ENCOUNTER — Ambulatory Visit: Payer: PPO | Attending: Adult Health | Admitting: Cardiology

## 2023-03-08 DIAGNOSIS — E119 Type 2 diabetes mellitus without complications: Secondary | ICD-10-CM

## 2023-03-08 DIAGNOSIS — Z9889 Other specified postprocedural states: Secondary | ICD-10-CM

## 2023-03-08 DIAGNOSIS — Z8679 Personal history of other diseases of the circulatory system: Secondary | ICD-10-CM | POA: Diagnosis not present

## 2023-03-08 DIAGNOSIS — E785 Hyperlipidemia, unspecified: Secondary | ICD-10-CM

## 2023-03-08 DIAGNOSIS — I10 Essential (primary) hypertension: Secondary | ICD-10-CM

## 2023-03-08 DIAGNOSIS — Z95818 Presence of other cardiac implants and grafts: Secondary | ICD-10-CM | POA: Diagnosis not present

## 2023-03-08 DIAGNOSIS — I480 Paroxysmal atrial fibrillation: Secondary | ICD-10-CM | POA: Diagnosis not present

## 2023-03-22 ENCOUNTER — Telehealth (INDEPENDENT_AMBULATORY_CARE_PROVIDER_SITE_OTHER): Payer: PPO | Admitting: Family Medicine

## 2023-03-22 ENCOUNTER — Encounter: Payer: Self-pay | Admitting: Family Medicine

## 2023-03-22 VITALS — BP 123/70 | Ht 67.0 in | Wt 173.0 lb

## 2023-03-22 DIAGNOSIS — I10 Essential (primary) hypertension: Secondary | ICD-10-CM | POA: Diagnosis not present

## 2023-03-22 DIAGNOSIS — N1832 Chronic kidney disease, stage 3b: Secondary | ICD-10-CM

## 2023-03-22 DIAGNOSIS — K582 Mixed irritable bowel syndrome: Secondary | ICD-10-CM | POA: Diagnosis not present

## 2023-03-22 MED ORDER — FIBERCON 625 MG PO TABS: 625.0000 mg | ORAL_TABLET | Freq: Every day | ORAL | 2 refills | Status: DC

## 2023-03-22 NOTE — Progress Notes (Signed)
Established Patient Office Visit   Subjective:  Patient ID: Ashley Woodard, female    DOB: Aug 30, 1950  Age: 73 y.o. MRN: [id number]  Chief Complaint  Patient presents with   Gas, Constipation, Bowel leakage    ongoing for 4 months    HPI Encounter Diagnoses  Name Primary?   Irritable bowel syndrome with both constipation and diarrhea Yes   5-month history of intermittent crampy abdominal pain and constipation alternating with loose stools.  There is been fecal incontinence.  There has been no blood or pus in the stool.  No melena.  Last colonoscopy was 3 years ago and was advised to return in 10 years.  She avoids dairy products.  Distant history of IBS.  No fevers chills or weight loss.   Review of Systems  Constitutional: Negative.  Negative for malaise/fatigue and weight loss.  HENT: Negative.    Eyes:  Negative for blurred vision, discharge and redness.  Respiratory: Negative.    Cardiovascular: Negative.   Gastrointestinal:  Positive for abdominal pain, constipation and diarrhea. Negative for blood in stool, heartburn, melena, nausea and vomiting.  Genitourinary: Negative.   Musculoskeletal: Negative.  Negative for myalgias.  Skin:  Negative for rash.  Neurological:  Negative for tingling, loss of consciousness and weakness.  Endo/Heme/Allergies:  Negative for polydipsia.     Current Outpatient Medications:    amLODipine (NORVASC) 10 MG tablet, TAKE 1 TABLET BY MOUTH EVERY DAY, Disp: 90 tablet, Rfl: 1   aspirin EC ([employer] ASPIRIN LOW DOSE) 81 MG tablet, TAKE 1 TABLET (81 MG TOTAL) BY MOUTH DAILY. SWALLOW WHOLE., Disp: 90 tablet, Rfl: 3   atorvastatin (LIPITOR) 10 MG tablet, TAKE 1 TABLET BY MOUTH EVERY DAY, Disp: 90 tablet, Rfl: 3   Blood Glucose Calibration (ONETOUCH VERIO) SOLN, 1 Act by In Vitro route in the morning and at bedtime., Disp: 1 each, Rfl: 5   Blood Glucose Monitoring Suppl (ONETOUCH VERIO FLEX SYSTEM) w/Device KIT, 1 Act by Does not apply route in the morning and  at bedtime., Disp: 1 kit, Rfl: 3   dapagliflozin propanediol (FARXIGA) 10 MG TABS tablet, Take 1 tablet (10 mg total) by mouth daily before breakfast., Disp: 90 tablet, Rfl: 1   diphenhydrAMINE HCl, Sleep, (SLEEP-AID) 50 MG CAPS, Take 50 mg by mouth at bedtime., Disp: , Rfl:    DULoxetine (CYMBALTA) 60 MG capsule, TAKE 1 CAPSULE BY MOUTH EVERY DAY, Disp: 90 capsule, Rfl: 3   glucose blood (ONETOUCH VERIO) test strip, 1 each by Other route in the morning and at bedtime. Use as instructed, Disp: 100 each, Rfl: 12   hydrALAZINE (APRESOLINE) 50 MG tablet, TAKE 1 TABLET BY MOUTH TWICE A DAY, Disp: 180 tablet, Rfl: 0   irbesartan (AVAPRO) 300 MG tablet, TAKE 1 TABLET BY MOUTH EVERY DAY, Disp: 90 tablet, Rfl: 0   Lancets (ONETOUCH ULTRASOFT) lancets, 1 each by Other route in the morning and at bedtime. Use as instructed, Disp: 100 each, Rfl: 5   levothyroxine (SYNTHROID) 75 MCG tablet, TAKE 1 TABLET BY MOUTH EVERY DAY, Disp: 90 tablet, Rfl: 3   metFORMIN (GLUCOPHAGE) 500 MG tablet, TAKE 1 TABLET BY MOUTH TWICE A DAY WITH FOOD, Disp: 180 tablet, Rfl: 0   metoprolol tartrate (LOPRESSOR) 25 MG tablet, Take 1 tablet (25 mg total) by mouth 2 (two) times daily., Disp: 180 tablet, Rfl: 3   polycarbophil (FIBERCON) 625 MG tablet, Take 1 tablet (625 mg total) by mouth daily., Disp: 30 tablet, Rfl: 2   torsemide (DEMADEX)  20 MG tablet, TAKE 1 TABLET BY MOUTH EVERY DAY, Disp: 90 tablet, Rfl: 0  Current Facility-Administered Medications:    botulinum toxin Type A (BOTOX) injection 100 Units, 100 Units, Intramuscular, Once, Levert Feinstein, MD   Objective:     BP 123/70   Ht 5\' 7"  (1.702 m)   Wt 173 lb (78.5 kg) Comment: approx 173lb  BMI 27.10 kg/m    Physical Exam Constitutional:      General: She is not in acute distress.    Appearance: Normal appearance. She is not ill-appearing, toxic-appearing or diaphoretic.  HENT:     Head: Normocephalic and atraumatic.     Right Ear: External ear normal.     Left  Ear: External ear normal.  Eyes:     General: No scleral icterus.       Right eye: No discharge.        Left eye: No discharge.     Extraocular Movements: Extraocular movements intact.     Conjunctiva/sclera: Conjunctivae normal.  Pulmonary:     Effort: Pulmonary effort is normal. No respiratory distress.  Skin:    General: Skin is warm and dry.  Neurological:     Mental Status: She is alert and oriented to person, place, and time.  Psychiatric:        Mood and Affect: Mood normal.        Behavior: Behavior normal.      No results found for any visits on 03/22/23.    The 10-year ASCVD risk score (Arnett DK, et al., 2019) is: 21.5%    Assessment & Plan:   Irritable bowel syndrome with both constipation and diarrhea -     FiberCon; Take 1 tablet (625 mg total) by mouth daily.  Dispense: 30 tablet; Refill: 2    Return if symptoms worsen or fail to improve.  Will download information about the FODMAP diet.  Will try FiberCon.  Will follow back up with these things are not helpful.  Mliss Sax, MD  Virtual Visit via Video Note  I connected with Ashley Woodard on 03/22/23 at  2:20 PM EST by a video enabled telemedicine application and verified that I am speaking with the correct person using two identifiers.  Location: Patient: Home alone in the room. Provider: work   I discussed the limitations of evaluation and management by telemedicine and the availability of in person appointments. The patient expressed understanding and agreed to proceed.  History of Present Illness:    Observations/Objective:   Assessment and Plan:   Follow Up Instructions:    I discussed the assessment and treatment plan with the patient. The patient was provided an opportunity to ask questions and all were answered. The patient agreed with the plan and demonstrated an understanding of the instructions.   The patient was advised to call back or seek an in-person evaluation if the  symptoms worsen or if the condition fails to improve as anticipated.  I provided 25 minutes of non-face-to-face time during this encounter.   Mliss Sax, MD

## 2023-03-24 ENCOUNTER — Other Ambulatory Visit: Payer: Self-pay | Admitting: Cardiology

## 2023-03-25 ENCOUNTER — Other Ambulatory Visit: Payer: Self-pay | Admitting: Family Medicine

## 2023-03-25 DIAGNOSIS — E119 Type 2 diabetes mellitus without complications: Secondary | ICD-10-CM

## 2023-03-25 DIAGNOSIS — I10 Essential (primary) hypertension: Secondary | ICD-10-CM

## 2023-04-01 ENCOUNTER — Other Ambulatory Visit: Payer: Self-pay | Admitting: Family Medicine

## 2023-04-01 DIAGNOSIS — E119 Type 2 diabetes mellitus without complications: Secondary | ICD-10-CM

## 2023-04-04 ENCOUNTER — Encounter: Payer: Self-pay | Admitting: Family Medicine

## 2023-04-04 ENCOUNTER — Other Ambulatory Visit: Payer: Self-pay

## 2023-04-04 DIAGNOSIS — I10 Essential (primary) hypertension: Secondary | ICD-10-CM

## 2023-04-04 MED ORDER — FIBERCON 625 MG PO TABS: 625.0000 mg | ORAL_TABLET | Freq: Every day | ORAL | 2 refills | Status: DC

## 2023-04-04 MED ORDER — AMLODIPINE BESYLATE 10 MG PO TABS: 10.0000 mg | ORAL_TABLET | Freq: Every day | ORAL | 1 refills | Status: DC

## 2023-04-08 ENCOUNTER — Encounter: Payer: Self-pay | Admitting: Family Medicine

## 2023-04-08 DIAGNOSIS — N1832 Chronic kidney disease, stage 3b: Secondary | ICD-10-CM

## 2023-04-17 ENCOUNTER — Other Ambulatory Visit: Payer: Self-pay | Admitting: Family Medicine

## 2023-04-18 ENCOUNTER — Other Ambulatory Visit: Payer: Self-pay | Admitting: Family Medicine

## 2023-04-18 DIAGNOSIS — E039 Hypothyroidism, unspecified: Secondary | ICD-10-CM

## 2023-04-19 ENCOUNTER — Other Ambulatory Visit: Payer: Self-pay | Admitting: Family Medicine

## 2023-04-19 DIAGNOSIS — E039 Hypothyroidism, unspecified: Secondary | ICD-10-CM

## 2023-05-20 ENCOUNTER — Other Ambulatory Visit: Payer: Self-pay | Admitting: Nephrology

## 2023-05-20 DIAGNOSIS — D631 Anemia in chronic kidney disease: Secondary | ICD-10-CM | POA: Diagnosis not present

## 2023-05-20 DIAGNOSIS — N189 Chronic kidney disease, unspecified: Secondary | ICD-10-CM | POA: Diagnosis not present

## 2023-05-20 DIAGNOSIS — E1122 Type 2 diabetes mellitus with diabetic chronic kidney disease: Secondary | ICD-10-CM | POA: Diagnosis not present

## 2023-05-20 DIAGNOSIS — N1832 Chronic kidney disease, stage 3b: Secondary | ICD-10-CM

## 2023-05-20 DIAGNOSIS — I129 Hypertensive chronic kidney disease with stage 1 through stage 4 chronic kidney disease, or unspecified chronic kidney disease: Secondary | ICD-10-CM | POA: Diagnosis not present

## 2023-05-20 DIAGNOSIS — N2581 Secondary hyperparathyroidism of renal origin: Secondary | ICD-10-CM | POA: Diagnosis not present

## 2023-05-22 ENCOUNTER — Other Ambulatory Visit: Payer: Self-pay | Admitting: Family Medicine

## 2023-05-22 DIAGNOSIS — I10 Essential (primary) hypertension: Secondary | ICD-10-CM

## 2023-05-23 ENCOUNTER — Other Ambulatory Visit: Payer: Self-pay | Admitting: Family Medicine

## 2023-05-23 DIAGNOSIS — N1832 Chronic kidney disease, stage 3b: Secondary | ICD-10-CM

## 2023-05-30 LAB — LAB REPORT - SCANNED
Albumin, Urine POC: 17.2
Creatinine, POC: 25.4 mg/dL
EGFR: 39
Microalb Creat Ratio: 68

## 2023-05-31 ENCOUNTER — Other Ambulatory Visit: Payer: Self-pay | Admitting: Nephrology

## 2023-05-31 DIAGNOSIS — N1832 Chronic kidney disease, stage 3b: Secondary | ICD-10-CM

## 2023-06-03 ENCOUNTER — Other Ambulatory Visit: Payer: Self-pay | Admitting: Family Medicine

## 2023-06-03 DIAGNOSIS — I129 Hypertensive chronic kidney disease with stage 1 through stage 4 chronic kidney disease, or unspecified chronic kidney disease: Secondary | ICD-10-CM | POA: Diagnosis not present

## 2023-06-03 DIAGNOSIS — N1832 Chronic kidney disease, stage 3b: Secondary | ICD-10-CM | POA: Diagnosis not present

## 2023-06-03 DIAGNOSIS — I10 Essential (primary) hypertension: Secondary | ICD-10-CM

## 2023-06-03 DIAGNOSIS — E1122 Type 2 diabetes mellitus with diabetic chronic kidney disease: Secondary | ICD-10-CM | POA: Diagnosis not present

## 2023-06-08 ENCOUNTER — Other Ambulatory Visit

## 2023-06-20 ENCOUNTER — Ambulatory Visit: Payer: Self-pay | Attending: Internal Medicine | Admitting: Internal Medicine

## 2023-06-20 VITALS — BP 130/60 | HR 58 | Ht 67.0 in | Wt 176.0 lb

## 2023-06-20 DIAGNOSIS — E785 Hyperlipidemia, unspecified: Secondary | ICD-10-CM | POA: Diagnosis not present

## 2023-06-20 DIAGNOSIS — Z95818 Presence of other cardiac implants and grafts: Secondary | ICD-10-CM

## 2023-06-20 DIAGNOSIS — Z9889 Other specified postprocedural states: Secondary | ICD-10-CM

## 2023-06-20 DIAGNOSIS — N1831 Chronic kidney disease, stage 3a: Secondary | ICD-10-CM | POA: Diagnosis not present

## 2023-06-20 DIAGNOSIS — I480 Paroxysmal atrial fibrillation: Secondary | ICD-10-CM | POA: Diagnosis not present

## 2023-06-20 DIAGNOSIS — Z8679 Personal history of other diseases of the circulatory system: Secondary | ICD-10-CM

## 2023-06-20 DIAGNOSIS — R002 Palpitations: Secondary | ICD-10-CM

## 2023-06-20 DIAGNOSIS — I10 Essential (primary) hypertension: Secondary | ICD-10-CM | POA: Diagnosis not present

## 2023-06-20 DIAGNOSIS — I351 Nonrheumatic aortic (valve) insufficiency: Secondary | ICD-10-CM

## 2023-06-20 MED ORDER — HYDRALAZINE HCL 50 MG PO TABS: 50.0000 mg | ORAL_TABLET | Freq: Three times a day (TID) | ORAL | 3 refills | Status: DC

## 2023-06-20 NOTE — Patient Instructions (Signed)
 Medication Instructions:  Your physician has recommended you make the following change in your medication:  TAKE: hydralazine  50 mg by mouth three times daily  *If you need a refill on your cardiac medications before your next appointment, please call your pharmacy*  Lab Work: NONE  If you have labs (blood work) drawn today and your tests are completely normal, you will receive your results only by: MyChart Message (if you have MyChart) OR A paper copy in the mail If you have any lab test that is abnormal or we need to change your treatment, we will call you to review the results.  Testing/Procedures: Your physician has requested that you have an echocardiogram. Echocardiography is a painless test that uses sound waves to create images of your heart. It provides your doctor with information about the size and shape of your heart and how well your heart's chambers and valves are working. This procedure takes approximately one hour. There are no restrictions for this procedure. Please do NOT wear cologne, perfume, aftershave, or lotions (deodorant is allowed). Please arrive 15 minutes prior to your appointment time.  Please note: We ask at that you not bring children with you during ultrasound (echo/ vascular) testing. Due to room size and safety concerns, children are not allowed in the ultrasound rooms during exams. Our front office staff cannot provide observation of children in our lobby area while testing is being conducted. An adult accompanying a patient to their appointment will only be allowed in the ultrasound room at the discretion of the ultrasound technician under special circumstances. We apologize for any inconvenience.   Follow-Up: At [facility], you and your health needs are our priority.  As part of our continuing mission to provide you with exceptional heart care, our providers are all part of one team.  This team includes your primary Cardiologist (physician) and  Advanced Practice Providers or APPs (Physician Assistants and Nurse Practitioners) who all work together to provide you with the care you need, when you need it.  Your next appointment:   12 month(s)  Provider:   Jann Melody, MD

## 2023-06-20 NOTE — Progress Notes (Signed)
 Cardiology Office Note:  .    Date:  06/20/2023  ID:  Ashley Woodard, DOB [birth year], MRN [id number] PCP: Tonna Frederic, MD  [facility] Providers Cardiologist:  Jann Melody, MD Electrophysiologist:  Boyce Byes, MD     CC: Establish care Consulted for the evaluation of HTN at the behest of Dr. Carolynne Citron   History of Present Illness: .    Ashley Woodard is a 73 y.o. female  with paroxysmal atrial fibrillation and hypertension who presents for cardiology consultation to establish care and evaluate aortic regurgitation.  She has a history of paroxysmal atrial fibrillation, premature atrial contractions, and premature ventricular contractions. She underwent an ablation in 2019 and had a successful Watchman procedure on January 03, 2023. She is currently in sinus bradycardia with a heart rate of 58 bpm and is on metoprolol  for rate control. No significant palpitations are reported, although she experienced some after her ablation, which is common post-procedure.  She has moderate aortic regurgitation and pulmonary artery dilation. She is asymptomatic regarding her aortic regurgitation, which is being monitored with regular echocardiograms. An echocardiogram performed in 2024 showed mild to moderate regurgitation, and she is scheduled for another to evaluate her condition.  She has a long-standing history of hypertension since her twenties and is currently on multiple medications, including Norvasc  10 mg, irbesartan , hydralazine , and metoprolol . Her blood pressure is well-controlled in the morning but rises throughout the day, reaching 160-180/80 mmHg. This has led to some kidney issues, and she is under the care of a nephrologist who suspects possible Conn's syndrome. She takes hydralazine  twice a day and reports taking amlodipine  at night and irbesartan  in the morning.  She is on atorvastatin  10 mg for cholesterol management, with her LDL cholesterol well-controlled under  70. She is also on Farxiga  and torsemide  daily.  Discussed the use of AI scribe software for clinical note transcription with the patient, who gave verbal consent to proceed.   Relevant histories: .  Social: Former [facility] patient ROS: As per HPI.   Studies Reviewed: .   Cardiac Studies & Procedures   ______________________________________________________________________________________________ CARDIAC CATHETERIZATION  CARDIAC CATHETERIZATION 08/20/2016  Conclusion Conclusions: 1. No angiographically significant coronary artery disease. 2. Normal left ventricular contraction. 3. Mildly elevated left ventricular filling pressure.  Recommendations: 1. Primary prevention of coronary artery disease. 2. Add low dose isosorbide  mononitrate for possible component of microvascular dysfunction. 3. Restart rivaroxaban  tomorrow evening if no evidence of bleeding right radial arteriotomy site. No need to continue aspirin  in the setting of chronic anticoagulation. 4. Anticipate discharge tomorrow if no further chest pain. Follow-up as an outpatient with Dr. Micael Adas.  Sammy Crisp, MD [facility] Pager: [phone]  Findings Coronary Findings Diagnostic  Dominance: Right  Left Main Vessel is large. Vessel is angiographically normal.  Left Anterior Descending Vessel is large. Vessel is angiographically normal.  First Diagonal Branch Vessel is large in size.  Second Diagonal Branch Vessel is moderate in size.  Left Circumflex Vessel is large. Vessel is angiographically normal.  First Obtuse Marginal Branch Vessel is small in size.  Second Obtuse Marginal Branch Vessel is moderate in size.  Third Obtuse Marginal Branch Vessel is small in size.  Right Coronary Artery Vessel is large. Vessel is angiographically normal.  Right Posterior Descending Artery Vessel is moderate in size.  Right Posterior Atrioventricular Artery Vessel is large in  size.  Intervention  No interventions have been documented.   STRESS TESTS  MYOCARDIAL PERFUSION IMAGING 08/17/2016  Narrative  Nuclear stress EF: 64%.  T wave inversion was noted during stress in the V4, V5 and V6 leads.  There was no ST segment deviation noted during stress.  Defect 1: There is a large defect of moderate severity.  Findings consistent with ischemia.  This is an intermediate risk study.  Large size, moderate intensity reversible anteroapical, apical and lateral wall perfusion defect (SDS 8) suggestive of ischemia or less likely shifting breast artifact. LVEF 64% with normal wall motion. This is an intermediate risk study. Clinical correlation is advised.   ECHOCARDIOGRAM  ECHOCARDIOGRAM COMPLETE 10/29/2021  Narrative ECHOCARDIOGRAM REPORT    Patient Name:   Ashley Woodard   Date of Exam: 10/29/2021 Medical Rec #:  [id number]     Height:       67.0 in Accession #:    [id number]    Weight:       193.6 lb Date of Birth:  March 26, 1950     BSA:          1.994 m Patient Age:    71 years      BP:           162/89 mmHg Patient Gender: F             HR:           64 bpm. Exam Location:  [address]  Procedure: 2D Echo, 3D Echo, Cardiac Doppler and Color Doppler  Indications:    I48.91 Atrial Fibrillation  History:        Patient has prior history of Echocardiogram examinations, most recent 09/20/2016. Risk Factors:Hypertension and Diabetes. Pre-OP .  Sonographer:    Juventino Oppenheim RCS Referring Phys: [id number] Leanora Prophet LAMBERT  IMPRESSIONS   1. Left ventricular ejection fraction, by estimation, is 60 to 65%. Left ventricular ejection fraction by 3D volume is 67 %. The left ventricle has normal function. The left ventricle has no regional wall motion abnormalities. There is mild concentric left ventricular hypertrophy. Left ventricular diastolic parameters were normal. 2. Right ventricular systolic function is normal. The right ventricular size is normal. There  is normal pulmonary artery systolic pressure. 3. Left atrial size was mildly dilated. 4. The mitral valve is normal in structure. No evidence of mitral valve regurgitation. No evidence of mitral stenosis. 5. The aortic valve is tricuspid. There is mild calcification of the aortic valve. There is mild thickening of the aortic valve. Aortic valve regurgitation is mild to moderate. No aortic stenosis is present. 6. The inferior vena cava is normal in size with greater than 50% respiratory variability, suggesting right atrial pressure of 3 mmHg.  FINDINGS Left Ventricle: Left ventricular ejection fraction, by estimation, is 60 to 65%. Left ventricular ejection fraction by 3D volume is 67 %. The left ventricle has normal function. The left ventricle has no regional wall motion abnormalities. The left ventricular internal cavity size was normal in size. There is mild concentric left ventricular hypertrophy. Left ventricular diastolic parameters were normal.  Right Ventricle: The right ventricular size is normal. No increase in right ventricular wall thickness. Right ventricular systolic function is normal. There is normal pulmonary artery systolic pressure. The tricuspid regurgitant velocity is 2.04 m/s, and with an assumed right atrial pressure of 3 mmHg, the estimated right ventricular systolic pressure is 19.6 mmHg.  Left Atrium: Left atrial size was mildly dilated.  Right Atrium: Right atrial size was normal in size.  Pericardium: There is no evidence of pericardial effusion.  Mitral Valve: The mitral valve is normal  in structure. No evidence of mitral valve regurgitation. No evidence of mitral valve stenosis.  Tricuspid Valve: The tricuspid valve is normal in structure. Tricuspid valve regurgitation is trivial. No evidence of tricuspid stenosis.  Aortic Valve: The aortic valve is tricuspid. There is mild calcification of the aortic valve. There is mild thickening of the aortic valve. Aortic  valve regurgitation is mild to moderate. Aortic regurgitation PHT measures 403 msec. No aortic stenosis is present.  Pulmonic Valve: The pulmonic valve was normal in structure. Pulmonic valve regurgitation is trivial. No evidence of pulmonic stenosis.  Aorta: The aortic root is normal in size and structure.  Venous: The inferior vena cava is normal in size with greater than 50% respiratory variability, suggesting right atrial pressure of 3 mmHg.  IAS/Shunts: No atrial level shunt detected by color flow Doppler.   LEFT VENTRICLE PLAX 2D LVIDd:         4.90 cm   Diastology LVIDs:         3.10 cm   LV e' medial:    8.59 cm/s LV PW:         1.20 cm   LV E/e' medial:  10.6 LV IVS:        1.10 cm   LV e' lateral:   5.77 cm/s LVOT diam:     2.00 cm   LV E/e' lateral: 15.7 LV SV:         91 LV SV Index:   46 LVOT Area:     3.14 cm  3D Volume EF: 3D EF:        67 % LV EDV:       139 ml LV ESV:       46 ml LV SV:        93 ml  RIGHT VENTRICLE RV Basal diam:  2.90 cm RV S prime:     17.40 cm/s TAPSE (M-mode): 2.1 cm RVSP:           19.6 mmHg  LEFT ATRIUM             Index        RIGHT ATRIUM           Index LA diam:        4.30 cm 2.16 cm/m   RA Pressure: 3.00 mmHg LA Vol (A2C):   31.4 ml 15.74 ml/m  RA Area:     11.80 cm LA Vol (A4C):   42.7 ml 21.41 ml/m  RA Volume:   24.60 ml  12.33 ml/m LA Biplane Vol: 37.5 ml 18.80 ml/m AORTIC VALVE LVOT Vmax:   133.00 cm/s LVOT Vmean:  96.600 cm/s LVOT VTI:    0.291 m AI PHT:      403 msec  AORTA Ao Root diam: 3.30 cm Ao Asc diam:  3.50 cm  MITRAL VALVE                TRICUSPID VALVE MV Area (PHT):              TR Peak grad:   16.6 mmHg MV Decel Time:              TR Vmax:        204.00 cm/s MV E velocity: 90.70 cm/s   Estimated RAP:  3.00 mmHg MV A velocity: 105.00 cm/s  RVSP:           19.6 mmHg MV E/A ratio:  0.86 SHUNTS Systemic VTI:  0.29 m Systemic Diam: 2.00 cm  Luana Rumple MD Electronically signed by Luana Rumple MD Signature Date/Time: 10/29/2021/4:11:13 PM    Final   TEE  ECHO TEE 03/04/2022  Narrative TRANSESOPHOGEAL ECHO REPORT    Patient Name:   Ashley Woodard Date of Exam: 03/04/2022 Medical Rec #:  [id number]   Height:       67.0 in Accession #:    [id number]  Weight:       183.0 lb Date of Birth:  [birth year]   BSA:          1.947 m Patient Age:    71 years    BP:           149/64 mmHg Patient Gender: F           HR:           71 bpm. Exam Location:  Inpatient  Procedure: Transesophageal Echo, 3D Echo, Color Doppler and Cardiac Doppler  Indications:     I48.1 Persistent atrial fibrillation  History:         Patient has prior history of Echocardiogram examinations, most recent 10/29/2021. Arrythmias:Atrial Fibrillation; Risk Factors:Hypertension, Diabetes and Dyslipidemia.  Sonographer:     Sherline Distel Senior RDCS Referring Phys:  [id number] Boyce Byes Diagnosing Phys: Gloriann Larger MD  PROCEDURE: After discussion of the risks and benefits of a TEE, an informed consent was obtained from the patient. The transesophogeal probe was passed without difficulty through the esophogus of the patient. Sedation performed by different physician. The patient was monitored while under deep sedation. The patient developed no complications during the procedure.  IMPRESSIONS   1. Interventional TEE for LAA-O. Prior to procedure, there was a patent left atrial appendage. Maximal diamter 2.16 cm suitable for a 27 mm Watchman FLX. Transeptal puncture was performed- a PFO was noted but this was not crossed. A 27 mm Device was deployed but with peri-device leak, sub optimal morphology, and lower limit of normal compression. There was a proximal lobe seen in the 135 view that this device had difficulty excluding. Device was recaptured. a 31 mm Watchman FLX device was placed. No peri-device leak. Average compression ~26%. Left to right shunting noted post procedure; a trivial pericardial  effusion remains unchanged through case.. Left atrial size was mildly dilated. No left atrial/left atrial appendage thrombus was detected. 2. Left ventricular ejection fraction, by estimation, is 60 to 65%. The left ventricle has normal function. 3. Right ventricular systolic function is normal. The right ventricular size is normal. 4. Right atrial size was mildly dilated. 5. The mitral valve is normal in structure. Trivial mitral valve regurgitation. No evidence of mitral stenosis. 6. The aortic valve is tricuspid. Aortic valve regurgitation is mild to moderate. Aortic valve sclerosis is present, with no evidence of aortic valve stenosis. 7. Evidence of atrial level shunting detected by color flow Doppler.  FINDINGS Left Ventricle: Left ventricular ejection fraction, by estimation, is 60 to 65%. The left ventricle has normal function. The left ventricular internal cavity size was normal in size.  Right Ventricle: The right ventricular size is normal. Right vetricular wall thickness was not well visualized. Right ventricular systolic function is normal.  Left Atrium: Interventional TEE for LAA-O. Prior to procedure, there was a patent left atrial appendage. Maximal diamter 2.16 cm suitable for a 27 mm Watchman FLX. Transeptal puncture was performed- a PFO was noted but this was not crossed. A 27 mm Device was deployed but with peri-device leak, sub optimal morphology, and lower limit of normal compression. There  was a proximal lobe seen in the 135 view that this device had difficulty excluding. Device was recaptured. a 31 mm Watchman FLX device was placed. No peri-device leak. Average compression ~26%. Left to right shunting noted post procedure; a trivial pericardial effusion remains unchanged through case. Left atrial size was mildly dilated. No left atrial/left atrial appendage thrombus was detected.  Right Atrium: Right atrial size was mildly dilated.  Pericardium: Trivial pericardial  effusion is present.  Mitral Valve: The mitral valve is normal in structure. Trivial mitral valve regurgitation. No evidence of mitral valve stenosis.  Tricuspid Valve: The tricuspid valve is normal in structure. Tricuspid valve regurgitation is not demonstrated. No evidence of tricuspid stenosis.  Aortic Valve: The aortic valve is tricuspid. Aortic valve regurgitation is mild to moderate. Aortic valve sclerosis is present, with no evidence of aortic valve stenosis.  Pulmonic Valve: The pulmonic valve was grossly normal. Pulmonic valve regurgitation is not visualized.  Aorta: The aortic root and ascending aorta are structurally normal, with no evidence of dilitation.  IAS/Shunts: Evidence of atrial level shunting detected by color flow Doppler.  Additional Comments: Spectral Doppler performed.  Gloriann Larger MD Electronically signed by Gloriann Larger MD Signature Date/Time: 03/04/2022/1:38:00 PM    Final  MONITORS  LONG TERM MONITOR (3-14 DAYS) 09/24/2022  Narrative HR 47 - 250 bpm, average 66 bpm. 59 nonsustained SVT, longest 10 seconds with average rate of 139 bpm. Rare supraventricular and ventricular ectopy. No sustained arrhythmias. No atrial fibrillation. Symptom trigger episodes correspond to sinus rhythm +/- PVC's, PAC's, SVT  Donelda Fujita T. Marven Slimmer, MD, [facility], [facility] Cardiac Electrophysiology       ______________________________________________________________________________________________      Physical Exam:    VS:  BP 130/60 (BP Location: Left Arm)   Pulse (!) 58   Ht 5\' 7"  (1.702 m)   Wt 79.8 kg   SpO2 97%   BMI 27.57 kg/m    Wt Readings from Last 3 Encounters:  06/20/23 79.8 kg  03/22/23 78.5 kg  02/21/23 78.7 kg    Gen: no distress  Neck: No JVD Cardiac: No Rubs or Gallops, soft diastolic murmur, regular bradycardia radial pulses Respiratory: Clear to auscultation bilaterally, normal effort, normal  respiratory rate GI: Soft,  nontender, non-distended  MS: No  edema;  moves all extremities Integument: Skin feels warm Neuro:  At time of evaluation, alert and oriented to person/place/time/situation  Psych: Normal affect, patient feels ok   ASSESSMENT AND PLAN: .    An EKG was ordered for PAF and shows sinus bradycardia  Paroxysmal atrial fibrillation Paroxysmal atrial fibrillation with history of ablation, currently in sinus bradycardia. No significant palpitations reported. EKG shows sinus bradycardia with QT within normal limits. - Continue metoprolol  for rate control.  Sinus bradycardia Sinus bradycardia on EKG with heart rate at 58 bpm. No significant symptoms reported.  Moderate aortic regurgitation Moderate aortic regurgitation with mild to moderate, asymptomatic. Echocardiogram planned to monitor progression. Intervention may be needed if condition progresses to severe with heart function abnormalities or heart dilation. - Order echocardiogram to evaluate aortic regurgitation.  Hypertension Long-standing hypertension with current regimen including amlodipine , irbesartan , metoprolol , and hydralazine . Blood pressure well controlled in the morning but increases throughout the day. Nephrologist evaluating for Conn's syndrome. Hydralazine  to be increased to TID to manage nocturnal hypertension until nephrologist confirms diagnosis and adjusts treatment. - Increase hydralazine  to three times a day. - primarily managed by PCP and nephrology who has testing planned  Pulmonary hypertension Pulmonary artery dilation suggestive of  pulmonary hypertension; echo as aabove  Watchman device placement Status post successful Watchman device placement, deescalated to aspirin  therapy. No current issues reported.  No leak on prior CT  Atrial septal defect (ASD) Residual iatrogenic ASD noted in 2024. - echo is pending; I do not suspect she will need this closed  Follow-up Follow-up plan to monitor cardiac conditions  and coordinate care with nephrologist and primary care physician. - Schedule follow-up in one year unless symptoms or test results indicate earlier intervention. - Coordinate care with nephrologist and primary care physician regarding hypertension management and potential Conn's syndrome.   Gloriann Larger, MD FASE [facility] Cardiologist [facility]  [address] [place], [address] [place], [place] [address] [phone]  10:44 AM

## 2023-06-24 ENCOUNTER — Ambulatory Visit
Admission: RE | Admit: 2023-06-24 | Discharge: 2023-06-24 | Disposition: A | Discharge status: Home / Self Care | Source: Ambulatory Visit | Attending: Nephrology

## 2023-06-24 DIAGNOSIS — N1832 Chronic kidney disease, stage 3b: Secondary | ICD-10-CM | POA: Diagnosis not present

## 2023-06-24 MED ORDER — GADOPICLENOL 0.5 MMOL/ML IV SOLN
8.0000 mL | Freq: Once | INTRAVENOUS | Status: AC | PRN
  Administered 2023-06-24: 8 mL via INTRAVENOUS

## 2023-06-29 ENCOUNTER — Other Ambulatory Visit: Payer: Self-pay | Admitting: Family Medicine

## 2023-06-29 DIAGNOSIS — E119 Type 2 diabetes mellitus without complications: Secondary | ICD-10-CM

## 2023-07-01 ENCOUNTER — Encounter (HOSPITAL_COMMUNITY): Payer: Self-pay

## 2023-07-07 ENCOUNTER — Other Ambulatory Visit: Payer: Self-pay | Admitting: Nephrology

## 2023-07-07 DIAGNOSIS — N1832 Chronic kidney disease, stage 3b: Secondary | ICD-10-CM

## 2023-07-07 DIAGNOSIS — I129 Hypertensive chronic kidney disease with stage 1 through stage 4 chronic kidney disease, or unspecified chronic kidney disease: Secondary | ICD-10-CM

## 2023-07-08 ENCOUNTER — Ambulatory Visit

## 2023-07-08 DIAGNOSIS — Z0000 Encounter for general adult medical examination without abnormal findings: Secondary | ICD-10-CM

## 2023-07-08 NOTE — Progress Notes (Signed)
 Subjective:   Ashley Woodard is a 73 y.o. who presents for a Medicare Wellness preventive visit.  As a reminder, Annual Wellness Visits don't include a physical exam, and some assessments may be limited, especially if this visit is performed virtually. We may recommend an in-person follow-up visit with your provider if needed.  Visit Complete: Virtual I connected with  Loni Engdahl on 07/08/23 by a video and audio enabled telemedicine application and verified that I am speaking with the correct person using two identifiers.  Patient Location: Home  Provider Location: Office/Clinic  I discussed the limitations of evaluation and management by telemedicine. The patient expressed understanding and agreed to proceed.  Vital Signs: Because this visit was a virtual/telehealth visit, some criteria may be missing or patient reported. Any vitals not documented were not able to be obtained and vitals that have been documented are patient reported.    Persons Participating in Visit: Patient.  AWV Questionnaire: Yes: Patient Medicare AWV questionnaire was completed by the patient on 07/04/2023; I have confirmed that all information answered by patient is correct and no changes since this date.  Cardiac Risk Factors include: advanced age (>34men, >93 women);diabetes mellitus;dyslipidemia;hypertension     Objective:     Today's Vitals   There is no height or weight on file to calculate BMI.     07/08/2023   10:03 AM 05/24/2022   10:17 AM 03/04/2022   10:51 AM 05/20/2021   10:39 AM 01/14/2020    3:52 PM 02/06/2019    9:06 AM 01/31/2019   11:20 AM  Advanced Directives  Does Patient Have a Medical Advance Directive? Yes Yes Yes Yes Yes Yes Yes  Type of Estate agent of [place];Living will Healthcare Power of [place];Living will Healthcare Power of [place];Living will Healthcare Power of [place];Living will Healthcare Power of [place];Living will Healthcare Power of  [place];Living will   Does patient want to make changes to medical advance directive?   No - Patient declined      Copy of Healthcare Power of Attorney in Chart? No - copy requested No - copy requested No - copy requested No - copy requested No - copy requested No - copy requested     Current Medications (verified) Outpatient Encounter Medications as of 07/08/2023  Medication Sig   amLODipine  (NORVASC ) 10 MG tablet Take 1 tablet (10 mg total) by mouth daily.   ASPIRIN  LOW DOSE 81 MG tablet TAKE 1 TABLET (81 MG TOTAL) BY MOUTH DAILY. SWALLOW WHOLE.   atorvastatin  (LIPITOR) 10 MG tablet TAKE 1 TABLET BY MOUTH EVERY DAY   Blood Glucose Calibration (ONETOUCH VERIO) SOLN 1 Act by In Vitro route in the morning and at bedtime.   Blood Glucose Monitoring Suppl (ONETOUCH VERIO FLEX SYSTEM) w/Device KIT 1 Act by Does not apply route in the morning and at bedtime.   diphenhydrAMINE  HCl, Sleep, (SLEEP-AID) 50 MG CAPS Take 50 mg by mouth at bedtime.   DULoxetine  (CYMBALTA ) 60 MG capsule TAKE 1 CAPSULE BY MOUTH EVERY DAY   FARXIGA  10 MG TABS tablet TAKE 1 TABLET BY MOUTH DAILY BEFORE BREAKFAST.   glucose blood (ONETOUCH VERIO) test strip 1 each by Other route in the morning and at bedtime. Use as instructed   hydrALAZINE  (APRESOLINE ) 50 MG tablet Take 1 tablet (50 mg total) by mouth 3 (three) times daily.   irbesartan  (AVAPRO ) 300 MG tablet TAKE 1 TABLET BY MOUTH EVERY DAY   levothyroxine  (SYNTHROID ) 75 MCG tablet TAKE 1 TABLET BY MOUTH EVERY  DAY   metFORMIN  (GLUCOPHAGE ) 500 MG tablet TAKE 1 TABLET BY MOUTH TWICE A DAY WITH FOOD   torsemide  (DEMADEX ) 20 MG tablet TAKE 1 TABLET BY MOUTH EVERY DAY   Lancets (ONETOUCH ULTRASOFT) lancets 1 each by Other route in the morning and at bedtime. Use as instructed   metoprolol  tartrate (LOPRESSOR ) 25 MG tablet Take 1 tablet (25 mg total) by mouth 2 (two) times daily.   Facility-Administered Encounter Medications as of 07/08/2023  Medication   botulinum toxin Type A   (BOTOX ) injection 100 Units    Allergies (verified) Amlodipine , Demeclocycline, Doxycycline, Erythromycin, Lisinopril, and Tetracyclines & related   History: Past Medical History:  Diagnosis Date   Abnormal cardiovascular stress test 08/21/2016   a. done for abnl EKG/cardiac risk factors -> admitted for cath 07/2016 which showed no significant CAD, normal LV contraction, mildly elevated filling pressure. Low dose Imdur  was added for possible component of microvascular dysfunction.    Allergy    Anxiety    Arthritis    "back, knees, fingers" (05/10/2017)   Chronic kidney disease 2023   3b   Chronic lower back pain    Colon polyps    Deafness in right ear    Depression    Diverticulosis    Hx of adenomatous colonic polyps 05/08/2003   Hypertension    Hypertriglyceridemia    Hypothyroidism    Migraine    "none in the 2000s" (05/10/2017)   Osteoporosis    Paroxysmal atrial fibrillation (HCC)    Pneumonia    "several times" (05/10/2017)   PONV (postoperative nausea and vomiting)    Presence of Watchman left atrial appendage closure device 03/04/2022   s/p LAAO with a 31 mm Watchman FLX by Dr. Marven Slimmer   S/P cardiac catheterization, 08/20/16 minimal CAD 08/21/2016   Sleep apnea    Type II diabetes mellitus (HCC)    Voice tremor    Past Surgical History:  Procedure Laterality Date   ABDOMINAL HYSTERECTOMY  1988   ATRIAL FIBRILLATION ABLATION N/A 05/10/2017   Procedure: ATRIAL FIBRILLATION ABLATION;  Surgeon: Jolly Needle, MD;  Location: [facility];  Service: Cardiovascular;  Laterality: N/A;   CARDIAC CATHETERIZATION  2019   COCHLEAR IMPLANT Right 2008   COLONOSCOPY     FRACTURE SURGERY  01/16/20   LEFT ATRIAL APPENDAGE OCCLUSION N/A 03/04/2022   Procedure: LEFT ATRIAL APPENDAGE OCCLUSION;  Surgeon: Boyce Byes, MD;  Location: [facility];  Service: Cardiovascular;  Laterality: N/A;   LEFT HEART CATH AND CORONARY ANGIOGRAPHY N/A 08/20/2016   Procedure:  Left Heart Cath and Coronary Angiography;  Surgeon: Sammy Crisp, MD;  Location: [facility];  Service: Cardiovascular;  Laterality: N/A;   LOOP RECORDER INSERTION N/A 11/29/2017   Procedure: LOOP RECORDER INSERTION;  Surgeon: Jolly Needle, MD;  Location: [facility];  Service: Cardiovascular;  Laterality: N/A;   ORIF ANKLE FRACTURE Right 02/06/2019   Procedure: OPEN REDUCTION INTERNAL FIXATION (ORIF) ANKLE FRACTURE LATERAL MALLEOLUS, SYNDESMOSIS, RIGHT;  Surgeon: Donnamarie Gables, MD;  Location: [facility];  Service: Orthopedics;  Laterality: Right;  SURGERY REQUEST TIME: 1.5 HOURS  CPT CODES: 40981, 27829, 19147, 82956   TEE WITHOUT CARDIOVERSION N/A 03/04/2022   Procedure: TRANSESOPHAGEAL ECHOCARDIOGRAM (TEE);  Surgeon: Boyce Byes, MD;  Location: [facility] CV [facility];  Service: Cardiovascular;  Laterality: N/A;   watchmen procedure     02/2022   Family History  Problem Relation Age of Onset   Hypertension Mother  Diabetes Mother    Heart attack Mother    Colon polyps Mother    Arthritis Mother    Heart disease Mother    Vision loss Mother    Anemia Mother    Heart failure Mother    Hypertension Father    Heart attack Father    Stroke Father    Breast cancer Sister 47   Cancer Sister    Diabetes Sister    Breast cancer Maternal Aunt        in 91's   Breast cancer Maternal Aunt 28 - 59   Lung cancer Maternal Uncle    Cancer Maternal Uncle    Hypertension Brother    Diabetes Brother    Dementia Brother    Stroke Brother    Heart attack Brother    Prostate cancer Brother    Heart disease Brother    Kidney disease Brother    Heart failure Brother    Hypertension Son    Heart disease Brother    Hypertension Brother    Heart attack Brother    Heart failure Brother    Hearing loss Son    Hypertension Son    Colon cancer Neg Hx    Esophageal cancer Neg Hx    Rectal cancer Neg Hx    Stomach cancer Neg Hx    Social History    Socioeconomic History   Marital status: Married    Spouse name: Not on file   Number of children: 2   Years of education: 12   Highest education level: 12th grade  Occupational History   Occupation: Art therapist  Tobacco Use   Smoking status: Never   Smokeless tobacco: Never  Vaping Use   Vaping status: Never Used  Substance and Sexual Activity   Alcohol use: No   Drug use: No   Sexual activity: Not Currently    Birth control/protection: Abstinence, Post-menopausal  Other Topics Concern   Not on file  Social History Narrative   Lives at home with husband in [place].   Right-handed.   No caffeine use.   Manages a call center   Social Drivers of Health   Financial Resource Strain: Low Risk  (07/08/2023)   Overall Financial Resource Strain (CARDIA)    Difficulty of Paying Living Expenses: Not hard at all  Food Insecurity: No Food Insecurity (07/08/2023)   Hunger Vital Sign    Worried About Running Out of Food in the Last Year: Never true    Ran Out of Food in the Last Year: Never true  Transportation Needs: No Transportation Needs (07/08/2023)   PRAPARE - Administrator, Civil Service (Medical): No    Lack of Transportation (Non-Medical): No  Physical Activity: Inactive (07/08/2023)   Exercise Vital Sign    Days of Exercise per Week: 0 days    Minutes of Exercise per Session: 0 min  Stress: No Stress Concern Present (07/08/2023)   [employer] of Occupational Health - Occupational Stress Questionnaire    Feeling of Stress : Not at all  Social Connections: Moderately Integrated (07/08/2023)   Social Connection and Isolation Panel [NHANES]    Frequency of Communication with Friends and Family: More than three times a week    Frequency of Social Gatherings with Friends and Family: Once a week    Attends Religious Services: More than 4 times per year    Active Member of [employer] or Organizations: No    Attends Banker Meetings: Never  Marital  Status: Married    Tobacco Counseling Counseling given: Not Answered    Clinical Intake:  Pre-visit preparation completed: Yes  Pain : No/denies pain     Nutritional Risks: None Diabetes: Yes CBG done?: No Did pt. bring in CBG monitor from home?: No  Lab Results  Component Value Date   HGBA1C 6.6 (H) 02/21/2023   HGBA1C 6.2 11/04/2022   HGBA1C 6.1 07/30/2022     How often do you need to have someone help you when you read instructions, pamphlets, or other written materials from your doctor or pharmacy?: 1 - Never  Interpreter Needed?: No  Information entered by :: NAllen LPN   Activities of Daily Living     07/04/2023   10:46 PM 05/27/2023    9:31 AM  In your present state of health, do you have any difficulty performing the following activities:  Hearing? 1 1  Comment cochlear implant   Vision? 0 0  Difficulty concentrating or making decisions? 0 0  Walking or climbing stairs? 0 0  Dressing or bathing? 0 0  Doing errands, shopping? 0 0  Preparing Food and eating ? N N  Using the Toilet? N N  In the past six months, have you accidently leaked urine? Y Y  Comment a little   Do you have problems with loss of bowel control? Y Y  Comment a little   Managing your Medications? N N  Managing your Finances? N N  Housekeeping or managing your Housekeeping? N N    Patient Care Team: Tonna Frederic, MD as PCP - General (Family Medicine) Boyce Byes, MD as PCP - Electrophysiology (Cardiology) Jann Melody, MD as PCP - Cardiology (Cardiology) Haroldine Likens, OD as Physician Assistant (Optometry) Virgina Grills, MD as Consulting Physician (Otolaryngology) Jacqueline Matsu, MD as Consulting Physician (Cardiology) Haroldine Likens, OD as Physician Assistant (Optometry) Rolando Cliche, [facility] as Pharmacist (Pharmacist)  Indicate any recent Medical Services you may have received from other than [facility] providers in the past year (date  may be approximate).     Assessment:    This is a routine wellness examination for [place].  Hearing/Vision screen Hearing Screening - Comments:: Has cochlear implant Vision Screening - Comments:: Regular eye exams, [facility]   Goals Addressed             This Visit's Progress    Patient Stated       07/08/2023, working on creatinine       Depression Screen     07/08/2023   10:04 AM 03/22/2023    2:27 PM 07/30/2022    9:34 AM 05/24/2022   10:17 AM 04/23/2022    8:05 AM 03/18/2022    9:58 AM 10/23/2021    9:18 AM  PHQ 2/9 Scores  PHQ - 2 Score 0 0 0 0 0 0 0  PHQ- 9 Score 2          Fall Risk     07/04/2023   10:46 PM 05/27/2023    9:31 AM 03/22/2023    2:27 PM 05/21/2022   10:34 PM 04/23/2022    8:05 AM  Fall Risk   Falls in the past year? 1 0 1 0 0  Comment equilibrium off      Number falls in past yr: 1 0 0 0 0  Injury with Fall? 0 0 0 0 0  Risk for fall due to : Impaired balance/gait;History of fall(s)  History of fall(s) Medication  side effect No Fall Risks  Follow up Falls evaluation completed;Falls prevention discussed  Falls evaluation completed Falls prevention discussed;Education provided;Falls evaluation completed Falls evaluation completed    MEDICARE RISK AT HOME:  Medicare Risk at Home Any stairs in or around the home?: (Patient-Rptd) No If so, are there any without handrails?: (Patient-Rptd) No Home free of loose throw rugs in walkways, pet beds, electrical cords, etc?: (Patient-Rptd) Yes Adequate lighting in your home to reduce risk of falls?: (Patient-Rptd) Yes Life alert?: (Patient-Rptd) No Use of a cane, walker or w/c?: (Patient-Rptd) No Grab bars in the bathroom?: (Patient-Rptd) No Shower chair or bench in shower?: (Patient-Rptd) Yes Elevated toilet seat or a handicapped toilet?: (Patient-Rptd) No  TIMED UP AND GO:  Was the test performed?  No  Cognitive Function: 6CIT completed    01/05/2019    2:23 PM  MMSE - Mini Mental State Exam   Orientation to time 5  Orientation to Place 5  Registration 3  Attention/ Calculation 2  Recall 3  Language- name 2 objects 2  Language- repeat 1  Language- follow 3 step command 3  Language- read & follow direction 1  Write a sentence 1  Copy design 1  Total score 27        07/08/2023   10:05 AM 05/24/2022   10:18 AM  6CIT Screen  What Year? 0 points 0 points  What month? 0 points 0 points  What time? 0 points 0 points  Count back from 20 0 points 0 points  Months in reverse 0 points 0 points  Repeat phrase 4 points 2 points  Total Score 4 points 2 points    Immunizations Immunization History  Administered Date(s) Administered   Fluad Quad(high Dose 65+) 12/14/2018, 01/31/2019, 12/17/2020, 10/27/2021   Fluad Trivalent(High Dose 65+) 11/04/2022   Influenza, High Dose Seasonal PF 11/28/2016, 01/22/2018, 12/14/2018, 12/12/2019   Influenza-Unspecified 12/10/2015, 11/11/2016, 11/22/2017   Moderna Covid-19 Fall Seasonal Vaccine 45yrs & older 11/04/2022   PFIZER(Purple Top)SARS-COV-2 Vaccination 04/22/2019, 05/14/2019, 12/12/2019   Pneumococcal Conjugate-13 06/29/2016   Pneumococcal Polysaccharide-23 12/04/2013, 01/05/2019   Tdap 03/13/2014, 08/31/2022   Unspecified SARS-COV-2 Vaccination 12/22/2021   Zoster Recombinant(Shingrix ) 12/23/2021, 09/27/2022   Zoster, Live 03/13/2014    Screening Tests Health Maintenance  Topic Date Due   FOOT EXAM  07/30/2023   HEMOGLOBIN A1C  08/22/2023   INFLUENZA VACCINE  09/23/2023   OPHTHALMOLOGY EXAM  09/23/2023   MAMMOGRAM  01/05/2024   Diabetic kidney evaluation - eGFR measurement  05/29/2024   Diabetic kidney evaluation - Urine ACR  05/29/2024   Medicare Annual Wellness (AWV)  07/07/2024   Colonoscopy  10/05/2025   DTaP/Tdap/Td (3 - Td or Tdap) 08/30/2032   Pneumonia Vaccine 42+ Years old  Completed   DEXA SCAN  Completed   Hepatitis C Screening  Completed   HPV VACCINES  Aged Out   Meningococcal B Vaccine  Aged Out    COVID-19 Vaccine  Discontinued   Zoster Vaccines- Shingrix   Discontinued    Health Maintenance  There are no preventive care reminders to display for this patient.  Health Maintenance Items Addressed: Up to date  Additional Screening:  Vision Screening: Recommended annual ophthalmology exams for early detection of glaucoma and other disorders of the eye.  Dental Screening: Recommended annual dental exams for proper oral hygiene  Community Resource Referral / Chronic Care Management: CRR required this visit?  No   CCM required this visit?  No   Plan:    I have personally  reviewed and noted the following in the patient's chart:   Medical and social history Use of alcohol, tobacco or illicit drugs  Current medications and supplements including opioid prescriptions. Patient is not currently taking opioid prescriptions. Functional ability and status Nutritional status Physical activity Advanced directives List of other physicians Hospitalizations, surgeries, and ER visits in previous 12 months Vitals Screenings to include cognitive, depression, and falls Referrals and appointments  In addition, I have reviewed and discussed with patient certain preventive protocols, quality metrics, and best practice recommendations. A written personalized care plan for preventive services as well as general preventive health recommendations were provided to patient.   Areatha Beecham, LPN   03/24/8655   After Visit Summary: (MyChart) Due to this being a telephonic visit, the after visit summary with patients personalized plan was offered to patient via MyChart   Notes: Nothing significant to report at this time.

## 2023-07-08 NOTE — Patient Instructions (Signed)
 Ashley Woodard , Thank you for taking time out of your busy schedule to complete your Annual Wellness Visit with me. I enjoyed our conversation and look forward to speaking with you again next year. I, as well as your care team,  appreciate your ongoing commitment to your health goals. Please review the following plan we discussed and let me know if I can assist you in the future. Your Game plan/ To Do List    Referrals: If you haven't heard from the office you've been referred to, please reach out to them at the phone provided.  N/a Follow up Visits: Next Medicare AWV with our clinical staff: 07/13/2024 at 10:10   Have you seen your provider in the last 6 months (3 months if uncontrolled diabetes)? Yes Next Office Visit with your provider: 08/22/2023 at 8:20  Clinician Recommendations:  Aim for 30 minutes of exercise or brisk walking, 6-8 glasses of water, and 5 servings of fruits and vegetables each day.       This is a list of the screening recommended for you and due dates:  Health Maintenance  Topic Date Due   Complete foot exam   07/30/2023   Hemoglobin A1C  08/22/2023   Flu Shot  09/23/2023   Eye exam for diabetics  09/23/2023   Mammogram  01/05/2024   Yearly kidney function blood test for diabetes  05/29/2024   Yearly kidney health urinalysis for diabetes  05/29/2024   Medicare Annual Wellness Visit  07/07/2024   Colon Cancer Screening  10/05/2025   DTaP/Tdap/Td vaccine (3 - Td or Tdap) 08/30/2032   Pneumonia Vaccine  Completed   DEXA scan (bone density measurement)  Completed   Hepatitis C Screening  Completed   HPV Vaccine  Aged Out   Meningitis B Vaccine  Aged Out   COVID-19 Vaccine  Discontinued   Zoster (Shingles) Vaccine  Discontinued    Advanced directives: (Copy Requested) Please bring a copy of your health care power of attorney and living will to the office to be added to your chart at your convenience. You can mail to [facility] [address]  [place], [place] [address] or email to [email or web address] Advance Care Planning is important because it:  [x]  Makes sure you receive the medical care that is consistent with your values, goals, and preferences  [x]  It provides guidance to your family and loved ones and reduces their decisional burden about whether or not they are making the right decisions based on your wishes.  Follow the link provided in your after visit summary or read over the paperwork we have mailed to you to help you started getting your Advance Directives in place. If you need assistance in completing these, please reach out to us  so that we can help you!  See attachments for Preventive Care and Fall Prevention Tips.

## 2023-07-14 ENCOUNTER — Other Ambulatory Visit: Payer: Self-pay | Admitting: Family Medicine

## 2023-07-14 DIAGNOSIS — M51369 Other intervertebral disc degeneration, lumbar region without mention of lumbar back pain or lower extremity pain: Secondary | ICD-10-CM

## 2023-07-15 ENCOUNTER — Ambulatory Visit
Admission: RE | Admit: 2023-07-15 | Discharge: 2023-07-15 | Disposition: A | Discharge status: Home / Self Care | Source: Ambulatory Visit | Attending: Nephrology | Admitting: Nephrology

## 2023-07-15 DIAGNOSIS — N1832 Chronic kidney disease, stage 3b: Secondary | ICD-10-CM | POA: Diagnosis not present

## 2023-07-15 DIAGNOSIS — I129 Hypertensive chronic kidney disease with stage 1 through stage 4 chronic kidney disease, or unspecified chronic kidney disease: Secondary | ICD-10-CM

## 2023-07-21 ENCOUNTER — Ambulatory Visit (HOSPITAL_COMMUNITY)
Admission: RE | Admit: 2023-07-21 | Discharge: 2023-07-21 | Disposition: A | Discharge status: Home / Self Care | Source: Ambulatory Visit | Attending: Cardiology | Admitting: Cardiology

## 2023-07-21 DIAGNOSIS — I351 Nonrheumatic aortic (valve) insufficiency: Secondary | ICD-10-CM | POA: Diagnosis not present

## 2023-07-21 DIAGNOSIS — E1122 Type 2 diabetes mellitus with diabetic chronic kidney disease: Secondary | ICD-10-CM | POA: Diagnosis not present

## 2023-07-21 DIAGNOSIS — I10 Essential (primary) hypertension: Secondary | ICD-10-CM

## 2023-07-21 DIAGNOSIS — N1832 Chronic kidney disease, stage 3b: Secondary | ICD-10-CM | POA: Diagnosis not present

## 2023-07-21 DIAGNOSIS — E785 Hyperlipidemia, unspecified: Secondary | ICD-10-CM | POA: Diagnosis not present

## 2023-07-21 DIAGNOSIS — E039 Hypothyroidism, unspecified: Secondary | ICD-10-CM | POA: Diagnosis not present

## 2023-07-21 DIAGNOSIS — I129 Hypertensive chronic kidney disease with stage 1 through stage 4 chronic kidney disease, or unspecified chronic kidney disease: Secondary | ICD-10-CM | POA: Diagnosis not present

## 2023-07-21 LAB — ECHOCARDIOGRAM COMPLETE
Area-P 1/2: 3.87 cm2
S' Lateral: 2.89 cm

## 2023-07-22 ENCOUNTER — Encounter: Payer: Self-pay | Admitting: Internal Medicine

## 2023-07-22 LAB — LAB REPORT - SCANNED
Albumin, Urine POC: 4.2
Albumin/Creatinine Ratio, Urine, POC: 9
Creatinine, POC: 46.1 mg/dL
EGFR: 38

## 2023-07-22 MED ORDER — METOPROLOL TARTRATE 25 MG PO TABS: 25.0000 mg | ORAL_TABLET | Freq: Two times a day (BID) | ORAL | 3 refills | Status: AC

## 2023-07-25 ENCOUNTER — Ambulatory Visit: Payer: Self-pay

## 2023-07-25 DIAGNOSIS — I351 Nonrheumatic aortic (valve) insufficiency: Secondary | ICD-10-CM

## 2023-08-03 DIAGNOSIS — N1832 Chronic kidney disease, stage 3b: Secondary | ICD-10-CM | POA: Diagnosis not present

## 2023-08-06 ENCOUNTER — Other Ambulatory Visit: Payer: Self-pay | Admitting: Family Medicine

## 2023-08-06 DIAGNOSIS — E119 Type 2 diabetes mellitus without complications: Secondary | ICD-10-CM

## 2023-08-19 DIAGNOSIS — N1832 Chronic kidney disease, stage 3b: Secondary | ICD-10-CM | POA: Diagnosis not present

## 2023-08-22 ENCOUNTER — Ambulatory Visit: Payer: Self-pay | Admitting: Family Medicine

## 2023-08-22 ENCOUNTER — Encounter: Payer: Self-pay | Admitting: Family Medicine

## 2023-08-22 ENCOUNTER — Ambulatory Visit (INDEPENDENT_AMBULATORY_CARE_PROVIDER_SITE_OTHER): Payer: Medicare Other | Admitting: Family Medicine

## 2023-08-22 VITALS — BP 122/78 | HR 60 | Temp 97.1°F | Ht 67.0 in | Wt 174.6 lb

## 2023-08-22 DIAGNOSIS — I10 Essential (primary) hypertension: Secondary | ICD-10-CM

## 2023-08-22 DIAGNOSIS — E039 Hypothyroidism, unspecified: Secondary | ICD-10-CM | POA: Diagnosis not present

## 2023-08-22 DIAGNOSIS — K582 Mixed irritable bowel syndrome: Secondary | ICD-10-CM

## 2023-08-22 DIAGNOSIS — R7303 Prediabetes: Secondary | ICD-10-CM

## 2023-08-22 DIAGNOSIS — N1831 Chronic kidney disease, stage 3a: Secondary | ICD-10-CM

## 2023-08-22 DIAGNOSIS — E7800 Pure hypercholesterolemia, unspecified: Secondary | ICD-10-CM | POA: Diagnosis not present

## 2023-08-22 LAB — LIPID PANEL
Cholesterol: 114 mg/dL (ref 0–200)
HDL: 44.8 mg/dL (ref >39.00)
LDL Cholesterol: 48 mg/dL (ref 0–99)
NonHDL: 69.55
Total CHOL/HDL Ratio: 3
Triglycerides: 108 mg/dL (ref 0.0–149.0)
VLDL: 21.6 mg/dL (ref 0.0–40.0)

## 2023-08-22 LAB — URINALYSIS, ROUTINE W REFLEX MICROSCOPIC
Bilirubin Urine: NEGATIVE
Hgb urine dipstick: NEGATIVE
Ketones, ur: NEGATIVE
Leukocytes,Ua: NEGATIVE
Nitrite: NEGATIVE
Specific Gravity, Urine: 1.01 (ref 1.000–1.030)
Total Protein, Urine: NEGATIVE
Urine Glucose: >=1000 — AB
Urobilinogen, UA: 0.2 (ref 0.0–1.0)
pH: 6 (ref 5.0–8.0)

## 2023-08-22 LAB — CBC
HCT: 40.7 % (ref 36.0–46.0)
Hemoglobin: 13.4 g/dL (ref 12.0–15.0)
MCHC: 32.9 g/dL (ref 30.0–36.0)
MCV: 85.3 fl (ref 78.0–100.0)
Platelets: 294 10*3/uL (ref 150.0–400.0)
RBC: 4.77 Mil/uL (ref 3.87–5.11)
RDW: 15.9 % — ABNORMAL HIGH (ref 11.5–15.5)
WBC: 6.4 10*3/uL (ref 4.0–10.5)

## 2023-08-22 LAB — BASIC METABOLIC PANEL WITH GFR
BUN: 30 mg/dL — ABNORMAL HIGH (ref 6–23)
CO2: 26 meq/L (ref 19–32)
Calcium: 9.9 mg/dL (ref 8.4–10.5)
Chloride: 104 meq/L (ref 96–112)
Creatinine, Ser: 1.47 mg/dL — ABNORMAL HIGH (ref 0.40–1.20)
GFR: 35.5 mL/min — ABNORMAL LOW (ref >60.00)
Glucose, Bld: 106 mg/dL — ABNORMAL HIGH (ref 70–99)
Potassium: 4.4 meq/L (ref 3.5–5.1)
Sodium: 139 meq/L (ref 135–145)

## 2023-08-22 LAB — TSH: TSH: 3.42 u[IU]/mL (ref 0.35–5.50)

## 2023-08-22 LAB — MICROALBUMIN / CREATININE URINE RATIO
Creatinine,U: 112.6 mg/dL
Microalb Creat Ratio: UNDETERMINED mg/g (ref 0.0–30.0)
Microalb, Ur: <0.7 mg/dL

## 2023-08-22 LAB — HEMOGLOBIN A1C: Hgb A1c MFr Bld: 6.5 % (ref 4.6–6.5)

## 2023-08-22 NOTE — Progress Notes (Signed)
 Established Patient Office Visit   Subjective:  Patient ID: Ashley Woodard, female    DOB: 1950/12/01  Age: 73 y.o. MRN: [id number]  Chief Complaint  Patient presents with   Follow-up    6 months  fasting,still having problems with gas,     HPI Encounter Diagnoses  Name Primary?   Primary hypertension Yes   Prediabetes    Elevated LDL cholesterol level    Stage 3a chronic kidney disease (HCC)    Hypothyroidism, unspecified type    Irritable bowel syndrome with both constipation and diarrhea    Follow-up of above.  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  Medications prescribed for constipation have not been helpful.  Continues atorvastatin  10 elevated cholesterol.  75 mcg of levothyroxine  for hypothyroidism.   Review of Systems  Constitutional: Negative.   HENT: Negative.    Eyes:  Negative for blurred vision, discharge and redness.  Respiratory: Negative.    Cardiovascular: Negative.   Gastrointestinal:  Negative for abdominal pain.  Genitourinary: Negative.   Musculoskeletal: Negative.  Negative for myalgias.  Skin:  Negative for rash.  Neurological:  Negative for tingling, loss of consciousness and weakness.  Endo/Heme/Allergies:  Negative for polydipsia.     Current Outpatient Medications:    amLODipine  (NORVASC ) 10 MG tablet, Take 1 tablet (10 mg total) by mouth daily., Disp: 90 tablet, Rfl: 1   ASPIRIN  LOW DOSE 81 MG tablet, TAKE 1 TABLET (81 MG TOTAL) BY MOUTH DAILY. SWALLOW WHOLE., Disp: 90 tablet, Rfl: 3   atorvastatin  (LIPITOR) 10 MG tablet, TAKE 1 TABLET BY MOUTH EVERY DAY, Disp: 90 tablet, Rfl: 3   Blood Glucose Calibration (ONETOUCH VERIO) SOLN, 1 Act by In Vitro route in the morning and at bedtime., Disp: 1 each, Rfl: 5   Blood Glucose Monitoring Suppl (ONETOUCH VERIO FLEX SYSTEM) w/Device KIT, 1 Act by Does not apply route in the morning and at bedtime., Disp: 1 kit, Rfl: 3   chlorthalidone (HYGROTON) 25 MG tablet, Take 25 mg  by mouth daily. 1/2 a tablet, Disp: , Rfl:    diphenhydrAMINE  HCl, Sleep, (SLEEP-AID) 50 MG CAPS, Take 50 mg by mouth at bedtime., Disp: , Rfl:    DULoxetine  (CYMBALTA ) 60 MG capsule, TAKE 1 CAPSULE BY MOUTH EVERY DAY, Disp: 90 capsule, Rfl: 3   FARXIGA  10 MG TABS tablet, TAKE 1 TABLET BY MOUTH DAILY BEFORE BREAKFAST., Disp: 90 tablet, Rfl: 1   glucose blood (ONETOUCH VERIO) test strip, 1 each by Other route in the morning and at bedtime. Use as instructed, Disp: 100 each, Rfl: 12   irbesartan  (AVAPRO ) 300 MG tablet, TAKE 1 TABLET BY MOUTH EVERY DAY, Disp: 90 tablet, Rfl: 1   Lancets (ONETOUCH ULTRASOFT) lancets, 1 each by Other route in the morning and at bedtime. Use as instructed, Disp: 100 each, Rfl: 5   levothyroxine  (SYNTHROID ) 75 MCG tablet, TAKE 1 TABLET BY MOUTH EVERY DAY, Disp: 90 tablet, Rfl: 3   metFORMIN  (GLUCOPHAGE ) 500 MG tablet, TAKE 1 TABLET BY MOUTH TWICE A DAY WITH FOOD, Disp: 180 tablet, Rfl: 0   metoprolol  tartrate (LOPRESSOR ) 25 MG tablet, Take 1 tablet (25 mg total) by mouth 2 (two) times daily., Disp: 180 tablet, Rfl: 3   spironolactone  (ALDACTONE ) 25 MG tablet, Take 25 mg by mouth once., Disp: , Rfl:    hydrALAZINE  (APRESOLINE ) 50 MG tablet, Take 1 tablet (50 mg total) by mouth 3 (three) times daily., Disp: 270 tablet, Rfl: 3   torsemide  (  DEMADEX ) 20 MG tablet, TAKE 1 TABLET BY MOUTH EVERY DAY, Disp: 90 tablet, Rfl: 0  Current Facility-Administered Medications:    botulinum toxin Type A  (BOTOX ) injection 100 Units, 100 Units, Intramuscular, Once, Onita Duos, MD   Objective:     BP 122/78 (BP Location: Right Arm, Patient Position: Sitting, Cuff Size: Normal)   Pulse 60   Temp (!) 97.1 F (36.2 C) (Temporal)   Ht 5' 7 (1.702 m)   Wt 174 lb 9.6 oz (79.2 kg)   SpO2 98%   BMI 27.35 kg/m    Physical Exam Constitutional:      General: She is not in acute distress.    Appearance: Normal appearance. She is not ill-appearing, toxic-appearing or diaphoretic.  HENT:      Head: Normocephalic and atraumatic.     Right Ear: Tympanic membrane, ear canal and external ear normal.     Left Ear: Tympanic membrane, ear canal and external ear normal.     Mouth/Throat:     Mouth: Mucous membranes are moist.     Pharynx: Oropharynx is clear. No oropharyngeal exudate or posterior oropharyngeal erythema.   Eyes:     General: No scleral icterus.       Right eye: No discharge.        Left eye: No discharge.     Extraocular Movements: Extraocular movements intact.     Conjunctiva/sclera: Conjunctivae normal.     Pupils: Pupils are equal, round, and reactive to light.    Cardiovascular:     Rate and Rhythm: Normal rate and regular rhythm.  Pulmonary:     Effort: Pulmonary effort is normal. No respiratory distress.     Breath sounds: Normal breath sounds. No wheezing, rhonchi or rales.  Abdominal:     General: Bowel sounds are normal.   Musculoskeletal:     Cervical back: No rigidity or tenderness.     Right lower leg: No edema.     Left lower leg: No edema.  Lymphadenopathy:     Cervical: No cervical adenopathy.   Skin:    General: Skin is warm and dry.   Neurological:     Mental Status: She is alert and oriented to person, place, and time.   Psychiatric:        Mood and Affect: Mood normal.        Behavior: Behavior normal.    Diabetic Foot Exam - Simple   Simple Foot Form Diabetic Foot exam was performed with the following findings: Yes 08/22/2023  8:44 AM  Visual Inspection No deformities, no ulcerations, no other skin breakdown bilaterally: Yes Sensation Testing Intact to touch and monofilament testing bilaterally: Yes Pulse Check Posterior Tibialis and Dorsalis pulse intact bilaterally: Yes Comments      No results found for any visits on 08/22/23.    The 10-year ASCVD risk score (Arnett DK, et al., 2019) is: 23.7%    Assessment & Plan:   Primary hypertension -     Basic metabolic panel with GFR -     CBC -     Urinalysis,  Routine w reflex microscopic -     Microalbumin / creatinine urine ratio  Prediabetes -     Basic metabolic panel with GFR -     Hemoglobin A1c -     Urinalysis, Routine w reflex microscopic -     Microalbumin / creatinine urine ratio  Elevated LDL cholesterol level -     Lipid panel  Stage 3a chronic kidney disease (  HCC) -     Basic metabolic panel with GFR  Hypothyroidism, unspecified type -     TSH  Irritable bowel syndrome with both constipation and diarrhea -     Ambulatory referral to Gastroenterology    Return in about 6 months (around 02/21/2024).    Elsie Sim Lent, MD

## 2023-09-01 ENCOUNTER — Other Ambulatory Visit: Payer: Self-pay | Admitting: Family Medicine

## 2023-09-01 DIAGNOSIS — I10 Essential (primary) hypertension: Secondary | ICD-10-CM

## 2023-09-04 ENCOUNTER — Encounter: Payer: Self-pay | Admitting: Family Medicine

## 2023-09-13 DIAGNOSIS — R768 Other specified abnormal immunological findings in serum: Secondary | ICD-10-CM | POA: Diagnosis not present

## 2023-09-13 DIAGNOSIS — D631 Anemia in chronic kidney disease: Secondary | ICD-10-CM | POA: Diagnosis not present

## 2023-09-13 DIAGNOSIS — I129 Hypertensive chronic kidney disease with stage 1 through stage 4 chronic kidney disease, or unspecified chronic kidney disease: Secondary | ICD-10-CM | POA: Diagnosis not present

## 2023-09-13 DIAGNOSIS — N2581 Secondary hyperparathyroidism of renal origin: Secondary | ICD-10-CM | POA: Diagnosis not present

## 2023-09-13 DIAGNOSIS — E785 Hyperlipidemia, unspecified: Secondary | ICD-10-CM | POA: Diagnosis not present

## 2023-09-13 DIAGNOSIS — N1832 Chronic kidney disease, stage 3b: Secondary | ICD-10-CM | POA: Diagnosis not present

## 2023-09-13 DIAGNOSIS — E1122 Type 2 diabetes mellitus with diabetic chronic kidney disease: Secondary | ICD-10-CM | POA: Diagnosis not present

## 2023-09-13 DIAGNOSIS — R252 Cramp and spasm: Secondary | ICD-10-CM | POA: Diagnosis not present

## 2023-09-14 LAB — LAB REPORT - SCANNED
Albumin, Urine POC: 3
Creatinine, POC: 58.1 mg/dL
EGFR: 29
Microalb Creat Ratio: 5

## 2023-09-18 ENCOUNTER — Other Ambulatory Visit: Payer: Self-pay | Admitting: Family Medicine

## 2023-09-18 DIAGNOSIS — E119 Type 2 diabetes mellitus without complications: Secondary | ICD-10-CM

## 2023-09-18 DIAGNOSIS — I10 Essential (primary) hypertension: Secondary | ICD-10-CM

## 2023-09-27 ENCOUNTER — Other Ambulatory Visit: Payer: Self-pay | Admitting: Family Medicine

## 2023-09-27 DIAGNOSIS — I10 Essential (primary) hypertension: Secondary | ICD-10-CM

## 2023-10-01 ENCOUNTER — Encounter: Payer: Self-pay | Admitting: Family Medicine

## 2023-10-03 ENCOUNTER — Other Ambulatory Visit: Payer: Self-pay

## 2023-10-03 DIAGNOSIS — E119 Type 2 diabetes mellitus without complications: Secondary | ICD-10-CM

## 2023-10-03 MED ORDER — METFORMIN HCL 500 MG PO TABS: 500.0000 mg | ORAL_TABLET | Freq: Two times a day (BID) | ORAL | 0 refills | Status: DC

## 2023-10-07 ENCOUNTER — Ambulatory Visit
Admission: EM | Admit: 2023-10-07 | Discharge: 2023-10-07 | Disposition: A | Discharge status: Home / Self Care | Attending: Family Medicine | Admitting: Family Medicine

## 2023-10-07 DIAGNOSIS — N1832 Chronic kidney disease, stage 3b: Secondary | ICD-10-CM | POA: Diagnosis not present

## 2023-10-07 DIAGNOSIS — J309 Allergic rhinitis, unspecified: Secondary | ICD-10-CM | POA: Diagnosis not present

## 2023-10-07 DIAGNOSIS — J018 Other acute sinusitis: Secondary | ICD-10-CM | POA: Diagnosis not present

## 2023-10-07 HISTORY — DX: Systemic lupus erythematosus, unspecified: M32.9

## 2023-10-07 MED ORDER — PREDNISONE 20 MG PO TABS: ORAL_TABLET | ORAL | 0 refills | Status: DC

## 2023-10-07 NOTE — ED Triage Notes (Signed)
 Pt c/o cough, nasal drainage down back of throat x 1 week-denies fever-using zyrtec  and nasal spray-NAD-steady gait

## 2023-10-07 NOTE — ED Provider Notes (Signed)
 [facility]  Note:  This document was prepared using Conservation officer, historic buildings and may include unintentional dictation errors.  MRN: [id number] DOB: Oct 06, 1950  Subjective:   Ashley Woodard is a 73 y.o. female presenting for 1 week history of persistent sinus congestion, sinus drainage, coughing. No chest pain, shob, wheezing, n/v, abdominal pain. No asthma. No smoking of any kind including cigarettes, cigars, vaping, marijuana use.  Diabetes is well controlled. Does not need anticoagulation for her atrial fibrillation, cardiologist has advised no follow up needed. Has CKD III, was advised by her nephrologist to d/c her chlorthalidone. Just had labs 08/22/2023.  Usually does well with prednisone .   Current Facility-Administered Medications:    botulinum toxin Type A  (BOTOX ) injection 100 Units, 100 Units, Intramuscular, Once, Onita Duos, MD  Current Outpatient Medications:    amLODipine  (NORVASC ) 10 MG tablet, Take 1 tablet (10 mg total) by mouth daily., Disp: 90 tablet, Rfl: 1   ASPIRIN  LOW DOSE 81 MG tablet, TAKE 1 TABLET (81 MG TOTAL) BY MOUTH DAILY. SWALLOW WHOLE., Disp: 90 tablet, Rfl: 3   atorvastatin  (LIPITOR) 10 MG tablet, TAKE 1 TABLET BY MOUTH EVERY DAY, Disp: 90 tablet, Rfl: 3   Blood Glucose Calibration (ONETOUCH VERIO) SOLN, 1 Act by In Vitro route in the morning and at bedtime., Disp: 1 each, Rfl: 5   Blood Glucose Monitoring Suppl (ONETOUCH VERIO FLEX SYSTEM) w/Device KIT, 1 Act by Does not apply route in the morning and at bedtime., Disp: 1 kit, Rfl: 3   chlorthalidone (HYGROTON) 25 MG tablet, Take 25 mg by mouth daily. 1/2 a tablet, Disp: , Rfl:    diphenhydrAMINE  HCl, Sleep, (SLEEP-AID) 50 MG CAPS, Take 50 mg by mouth at bedtime., Disp: , Rfl:    DULoxetine  (CYMBALTA ) 60 MG capsule, TAKE 1 CAPSULE BY MOUTH EVERY DAY, Disp: 90 capsule, Rfl: 3   FARXIGA  10 MG TABS tablet, TAKE 1 TABLET BY MOUTH DAILY BEFORE BREAKFAST., Disp: 90 tablet, Rfl: 1    glucose blood (ONETOUCH VERIO) test strip, 1 each by Other route in the morning and at bedtime. Use as instructed, Disp: 100 each, Rfl: 12   irbesartan  (AVAPRO ) 300 MG tablet, TAKE 1 TABLET BY MOUTH EVERY DAY, Disp: 90 tablet, Rfl: 1   Lancets (ONETOUCH ULTRASOFT) lancets, 1 each by Other route in the morning and at bedtime. Use as instructed, Disp: 100 each, Rfl: 5   levothyroxine  (SYNTHROID ) 75 MCG tablet, TAKE 1 TABLET BY MOUTH EVERY DAY, Disp: 90 tablet, Rfl: 3   metFORMIN  (GLUCOPHAGE ) 500 MG tablet, Take 1 tablet (500 mg total) by mouth 2 (two) times daily with a meal., Disp: 180 tablet, Rfl: 0   metoprolol  tartrate (LOPRESSOR ) 25 MG tablet, Take 1 tablet (25 mg total) by mouth 2 (two) times daily., Disp: 180 tablet, Rfl: 3   spironolactone  (ALDACTONE ) 25 MG tablet, Take 25 mg by mouth once., Disp: , Rfl:    Allergies  Allergen Reactions   Amlodipine  Swelling    Ankle edema   Demeclocycline Rash   Doxycycline Rash   Erythromycin Rash   Lisinopril Cough   Tetracyclines & Related Rash    Past Medical History:  Diagnosis Date   Abnormal cardiovascular stress test 08/21/2016   a. done for abnl EKG/cardiac risk factors -> admitted for cath 07/2016 which showed no significant CAD, normal LV contraction, mildly elevated filling pressure. Low dose Imdur  was added for possible component of microvascular dysfunction.    Allergy    Anxiety  Arthritis    back, knees, fingers (05/10/2017)   Chronic kidney disease 2023   3b   Chronic lower back pain    Colon polyps    Deafness in right ear    Depression    Diverticulosis    Hx of adenomatous colonic polyps 05/08/2003   Hypertension    Hypertriglyceridemia    Hypothyroidism    Lupus (systemic lupus erythematosus) (HCC)    per pt   Migraine    none in the 2000s (05/10/2017)   Osteoporosis    Paroxysmal atrial fibrillation (HCC)    Pneumonia    several times (05/10/2017)   PONV (postoperative nausea and vomiting)    Presence of  Watchman left atrial appendage closure device 03/04/2022   s/p LAAO with a 31 mm Watchman FLX by Dr. Cindie   S/P cardiac catheterization, 08/20/16 minimal CAD 08/21/2016   Sleep apnea    Type II diabetes mellitus (HCC)    Voice tremor      Past Surgical History:  Procedure Laterality Date   ABDOMINAL HYSTERECTOMY  1988   ATRIAL FIBRILLATION ABLATION N/A 05/10/2017   Procedure: ATRIAL FIBRILLATION ABLATION;  Surgeon: Kelsie Agent, MD;  Location: [facility];  Service: Cardiovascular;  Laterality: N/A;   CARDIAC CATHETERIZATION  2019   COCHLEAR IMPLANT Right 2008   COLONOSCOPY     FRACTURE SURGERY  01/16/20   LEFT ATRIAL APPENDAGE OCCLUSION N/A 03/04/2022   Procedure: LEFT ATRIAL APPENDAGE OCCLUSION;  Surgeon: Cindie Ole DASEN, MD;  Location: [facility];  Service: Cardiovascular;  Laterality: N/A;   LEFT HEART CATH AND CORONARY ANGIOGRAPHY N/A 08/20/2016   Procedure: Left Heart Cath and Coronary Angiography;  Surgeon: Mady Bruckner, MD;  Location: [facility];  Service: Cardiovascular;  Laterality: N/A;   LOOP RECORDER INSERTION N/A 11/29/2017   Procedure: LOOP RECORDER INSERTION;  Surgeon: Kelsie Agent, MD;  Location: [facility];  Service: Cardiovascular;  Laterality: N/A;   ORIF ANKLE FRACTURE Right 02/06/2019   Procedure: OPEN REDUCTION INTERNAL FIXATION (ORIF) ANKLE FRACTURE LATERAL MALLEOLUS, SYNDESMOSIS, RIGHT;  Surgeon: Elsa Bruckner SAUNDERS, MD;  Location: [facility];  Service: Orthopedics;  Laterality: Right;  SURGERY REQUEST TIME: 1.5 HOURS  CPT CODES: 72207, 27829, 72301, 72389   TEE WITHOUT CARDIOVERSION N/A 03/04/2022   Procedure: TRANSESOPHAGEAL ECHOCARDIOGRAM (TEE);  Surgeon: Cindie Ole DASEN, MD;  [facility];  Service: Cardiovascular;  Laterality: N/A;   watchmen procedure     02/2022    Family History  Problem Relation Age of Onset   Hypertension Mother    Diabetes Mother    Heart attack Mother     Colon polyps Mother    Arthritis Mother    Heart disease Mother    Vision loss Mother    Anemia Mother    Heart failure Mother    Hypertension Father    Heart attack Father    Stroke Father    Breast cancer Sister 58   Cancer Sister    Diabetes Sister    Breast cancer Maternal Aunt        in 40's   Breast cancer Maternal Aunt 28 - 59   Lung cancer Maternal Uncle    Cancer Maternal Uncle    Hypertension Brother    Diabetes Brother    Dementia Brother    Stroke Brother    Heart attack Brother    Prostate cancer Brother    Heart disease Brother    Kidney  disease Brother    Heart failure Brother    Hypertension Son    Heart disease Brother    Hypertension Brother    Heart attack Brother    Heart failure Brother    Hearing loss Son    Hypertension Son    Colon cancer Neg Hx    Esophageal cancer Neg Hx    Rectal cancer Neg Hx    Stomach cancer Neg Hx     Social History   Tobacco Use   Smoking status: Never   Smokeless tobacco: Never  Vaping Use   Vaping status: Never Used  Substance Use Topics   Alcohol use: No   Drug use: No    ROS   Objective:   Vitals: BP 103/65 (BP Location: Right Arm)   Pulse 67   Temp 98.1 F (36.7 C) (Oral)   Resp 18   SpO2 94%   Physical Exam Constitutional:      General: She is not in acute distress.    Appearance: Normal appearance. She is well-developed and normal weight. She is not ill-appearing, toxic-appearing or diaphoretic.  HENT:     Head: Normocephalic and atraumatic.     Right Ear: Tympanic membrane, ear canal and external ear normal. No drainage or tenderness. No middle ear effusion. There is no impacted cerumen. Tympanic membrane is not erythematous or bulging.     Left Ear: Tympanic membrane, ear canal and external ear normal. No drainage or tenderness.  No middle ear effusion. There is no impacted cerumen. Tympanic membrane is not erythematous or bulging.     Nose: Congestion present. No rhinorrhea.      Mouth/Throat:     Mouth: Mucous membranes are moist. No oral lesions.     Pharynx: No pharyngeal swelling, oropharyngeal exudate, posterior oropharyngeal erythema or uvula swelling.     Tonsils: No tonsillar exudate or tonsillar abscesses.     Comments: Thick streaks of postnasal drainage overlying pharynx.  Eyes:     General: No scleral icterus.       Right eye: No discharge.        Left eye: No discharge.     Extraocular Movements: Extraocular movements intact.     Right eye: Normal extraocular motion.     Left eye: Normal extraocular motion.     Conjunctiva/sclera: Conjunctivae normal.  Cardiovascular:     Rate and Rhythm: Normal rate and regular rhythm.     Heart sounds: Normal heart sounds. No murmur heard.    No friction rub. No gallop.  Pulmonary:     Effort: Pulmonary effort is normal. No respiratory distress.     Breath sounds: No stridor. No wheezing, rhonchi or rales.  Chest:     Chest wall: No tenderness.  Musculoskeletal:     Cervical back: Normal range of motion and neck supple.  Lymphadenopathy:     Cervical: No cervical adenopathy.  Skin:    General: Skin is warm and dry.  Neurological:     General: No focal deficit present.     Mental Status: She is alert and oriented to person, place, and time.  Psychiatric:        Mood and Affect: Mood normal.        Behavior: Behavior normal.     Assessment and Plan :   PDMP not reviewed this encounter.  1. Acute non-recurrent sinusitis of other sinus   2. Allergic rhinitis, unspecified seasonality, unspecified trigger      Creatinine clearance 37-30mL/min.  Suspect  a sinus infection, likely worsened by her allergies.  Patient prefers to undergo prednisone .  I advised that if she continues to have issues we will need an antibiotic, can contact this on Sunday if that is the case.  Otherwise we will need to follow-up in clinic thereafter.  Counseled patient on potential for adverse effects with medications  prescribed/recommended today, ER and return-to-clinic precautions discussed, patient verbalized understanding.    Christopher Savannah, PA-C 10/07/23 1032

## 2023-10-07 NOTE — Discharge Instructions (Addendum)
 We will manage this as a sinus infection likely due to a virus and made worse by underlying allergies. We'll use prednisone  to help with your sinus and respiratory symptoms. For sore throat or cough try using a honey-based tea. Use 3 teaspoons of honey with juice squeezed from half lemon. Place shaved pieces of ginger into 1/2-1 cup of water and warm over stove top. Then mix the ingredients and repeat every 4 hours as needed. Please take Tylenol  500mg -650mg  every 6 hours for throat pain, fevers, aches and pains. Hydrate very well with at least 2 liters of water. Eat light meals such as soups (chicken and noodles, vegetable, chicken and wild rice).  Do not eat foods that you are allergic to.  Taking an antihistamine like Zyrtec  can help against postnasal drainage, sinus congestion which can cause sinus pain, sinus headaches, throat pain, painful swallowing, coughing.   If you continue to have issues with your sinuses then let us  know Sunday and we'll send a prescription for an antibiotic at that stage.

## 2023-10-13 ENCOUNTER — Ambulatory Visit
Admission: EM | Admit: 2023-10-13 | Discharge: 2023-10-13 | Disposition: A | Discharge status: Home / Self Care | Attending: Family Medicine | Admitting: Family Medicine

## 2023-10-13 DIAGNOSIS — J0141 Acute recurrent pansinusitis: Secondary | ICD-10-CM

## 2023-10-13 MED ORDER — AMOXICILLIN-POT CLAVULANATE 875-125 MG PO TABS: 1.0000 | ORAL_TABLET | Freq: Two times a day (BID) | ORAL | 0 refills | Status: DC

## 2023-10-13 NOTE — ED Triage Notes (Signed)
 Pt c/o sinus pain, pressure x 2 weeks-states she was seen here last week-felt better after prednisone  then worse yesterday-NAD-steady gait

## 2023-10-13 NOTE — ED Provider Notes (Signed)
 [facility]  Note:  This document was prepared using Conservation officer, historic buildings and may include unintentional dictation errors.  MRN: [id number] DOB: [birth year]  Subjective:   Ashley Woodard is a 73 y.o. female presenting for 2-week history of persistent sinus pressure, sinus congestion and pain, sinus drainage.  Patient was seen a week ago.  At the time she requested that we use steroids alone.  Unfortunately, this did not resolve her symptoms and shortly after completing the prednisone  course, had a return and worsening of her sinus symptoms.  No chest pain, shortness of breath or wheezing.   Current Facility-Administered Medications:    botulinum toxin Type A  (BOTOX ) injection 100 Units, 100 Units, Intramuscular, Once, Onita Duos, MD  Current Outpatient Medications:    amLODipine  (NORVASC ) 10 MG tablet, Take 1 tablet (10 mg total) by mouth daily., Disp: 90 tablet, Rfl: 1   ASPIRIN  LOW DOSE 81 MG tablet, TAKE 1 TABLET (81 MG TOTAL) BY MOUTH DAILY. SWALLOW WHOLE., Disp: 90 tablet, Rfl: 3   atorvastatin  (LIPITOR) 10 MG tablet, TAKE 1 TABLET BY MOUTH EVERY DAY, Disp: 90 tablet, Rfl: 3   Blood Glucose Calibration (ONETOUCH VERIO) SOLN, 1 Act by In Vitro route in the morning and at bedtime., Disp: 1 each, Rfl: 5   Blood Glucose Monitoring Suppl (ONETOUCH VERIO FLEX SYSTEM) w/Device KIT, 1 Act by Does not apply route in the morning and at bedtime., Disp: 1 kit, Rfl: 3   chlorthalidone (HYGROTON) 25 MG tablet, Take 25 mg by mouth daily. 1/2 a tablet, Disp: , Rfl:    diphenhydrAMINE  HCl, Sleep, (SLEEP-AID) 50 MG CAPS, Take 50 mg by mouth at bedtime., Disp: , Rfl:    DULoxetine  (CYMBALTA ) 60 MG capsule, TAKE 1 CAPSULE BY MOUTH EVERY DAY, Disp: 90 capsule, Rfl: 3   FARXIGA  10 MG TABS tablet, TAKE 1 TABLET BY MOUTH DAILY BEFORE BREAKFAST., Disp: 90 tablet, Rfl: 1   glucose blood (ONETOUCH VERIO) test strip, 1 each by Other route in the morning and at bedtime. Use as  instructed, Disp: 100 each, Rfl: 12   irbesartan  (AVAPRO ) 300 MG tablet, TAKE 1 TABLET BY MOUTH EVERY DAY, Disp: 90 tablet, Rfl: 1   Lancets (ONETOUCH ULTRASOFT) lancets, 1 each by Other route in the morning and at bedtime. Use as instructed, Disp: 100 each, Rfl: 5   levothyroxine  (SYNTHROID ) 75 MCG tablet, TAKE 1 TABLET BY MOUTH EVERY DAY, Disp: 90 tablet, Rfl: 3   metFORMIN  (GLUCOPHAGE ) 500 MG tablet, Take 1 tablet (500 mg total) by mouth 2 (two) times daily with a meal., Disp: 180 tablet, Rfl: 0   metoprolol  tartrate (LOPRESSOR ) 25 MG tablet, Take 1 tablet (25 mg total) by mouth 2 (two) times daily., Disp: 180 tablet, Rfl: 3   predniSONE  (DELTASONE ) 20 MG tablet, Take 2 tablets daily with breakfast., Disp: 10 tablet, Rfl: 0   spironolactone  (ALDACTONE ) 25 MG tablet, Take 25 mg by mouth once., Disp: , Rfl:    Allergies  Allergen Reactions   Amlodipine  Swelling    Ankle edema   Demeclocycline Rash   Doxycycline Rash   Erythromycin Rash   Lisinopril Cough   Tetracyclines & Related Rash    Past Medical History:  Diagnosis Date   Abnormal cardiovascular stress test 08/21/2016   a. done for abnl EKG/cardiac risk factors -> admitted for cath 07/2016 which showed no significant CAD, normal LV contraction, mildly elevated filling pressure. Low dose Imdur  was added for possible component of microvascular dysfunction.  Allergy    Anxiety    Arthritis    back, knees, fingers (05/10/2017)   Chronic kidney disease 2023   3b   Chronic lower back pain    Colon polyps    Deafness in right ear    Depression    Diverticulosis    Hx of adenomatous colonic polyps 05/08/2003   Hypertension    Hypertriglyceridemia    Hypothyroidism    Lupus (systemic lupus erythematosus) (HCC)    per pt   Migraine    none in the 2000s (05/10/2017)   Osteoporosis    Paroxysmal atrial fibrillation (HCC)    Pneumonia    several times (05/10/2017)   PONV (postoperative nausea and vomiting)    Presence of  Watchman left atrial appendage closure device 03/04/2022   s/p LAAO with a 31 mm Watchman FLX by Dr. Cindie   S/P cardiac catheterization, 08/20/16 minimal CAD 08/21/2016   Sleep apnea    Type II diabetes mellitus (HCC)    Voice tremor      Past Surgical History:  Procedure Laterality Date   ABDOMINAL HYSTERECTOMY  1988   ATRIAL FIBRILLATION ABLATION N/A 05/10/2017   Procedure: ATRIAL FIBRILLATION ABLATION;  Surgeon: Kelsie Agent, MD;  Location: [facility];  Service: Cardiovascular;  Laterality: N/A;   CARDIAC CATHETERIZATION  2019   COCHLEAR IMPLANT Right 2008   COLONOSCOPY     FRACTURE SURGERY  01/16/20   LEFT ATRIAL APPENDAGE OCCLUSION N/A 03/04/2022   Procedure: LEFT ATRIAL APPENDAGE OCCLUSION;  Surgeon: Cindie Ole DASEN, MD;  Location: [facility];  Service: Cardiovascular;  Laterality: N/A;   LEFT HEART CATH AND CORONARY ANGIOGRAPHY N/A 08/20/2016   Procedure: Left Heart Cath and Coronary Angiography;  Surgeon: Mady Bruckner, MD;  Location: [facility];  Service: Cardiovascular;  Laterality: N/A;   LOOP RECORDER INSERTION N/A 11/29/2017   Procedure: LOOP RECORDER INSERTION;  Surgeon: Kelsie Agent, MD;  Location: [facility];  Service: Cardiovascular;  Laterality: N/A;   ORIF ANKLE FRACTURE Right 02/06/2019   Procedure: OPEN REDUCTION INTERNAL FIXATION (ORIF) ANKLE FRACTURE LATERAL MALLEOLUS, SYNDESMOSIS, RIGHT;  Surgeon: Elsa Bruckner SAUNDERS, MD;  Location: [facility];  Service: Orthopedics;  Laterality: Right;  SURGERY REQUEST TIME: 1.5 HOURS  CPT CODES: 72207, 27829, 72301, 72389   TEE WITHOUT CARDIOVERSION N/A 03/04/2022   Procedure: TRANSESOPHAGEAL ECHOCARDIOGRAM (TEE);  Surgeon: Cindie Ole DASEN, MD;  Location: [facility];  Service: Cardiovascular;  Laterality: N/A;   watchmen procedure     02/2022    Family History  Problem Relation Age of Onset   Hypertension Mother    Diabetes Mother    Heart attack Mother     Colon polyps Mother    Arthritis Mother    Heart disease Mother    Vision loss Mother    Anemia Mother    Heart failure Mother    Hypertension Father    Heart attack Father    Stroke Father    Breast cancer Sister 85   Cancer Sister    Diabetes Sister    Breast cancer Maternal Aunt        in 51's   Breast cancer Maternal Aunt 45 - 59   Lung cancer Maternal Uncle    Cancer Maternal Uncle    Hypertension Brother    Diabetes Brother    Dementia Brother    Stroke Brother    Heart attack Brother    Prostate cancer Brother  Heart disease Brother    Kidney disease Brother    Heart failure Brother    Hypertension Son    Heart disease Brother    Hypertension Brother    Heart attack Brother    Heart failure Brother    Hearing loss Son    Hypertension Son    Colon cancer Neg Hx    Esophageal cancer Neg Hx    Rectal cancer Neg Hx    Stomach cancer Neg Hx     Social History   Tobacco Use   Smoking status: Never   Smokeless tobacco: Never  Vaping Use   Vaping status: Never Used  Substance Use Topics   Alcohol use: No   Drug use: No    ROS   Objective:   Vitals: BP 129/73 (BP Location: Right Arm)   Pulse (!) 50   Temp 98.9 F (37.2 C) (Oral)   Resp 20   SpO2 96%   Physical Exam Constitutional:      General: She is not in acute distress.    Appearance: Normal appearance. She is well-developed and normal weight. She is not ill-appearing, toxic-appearing or diaphoretic.  HENT:     Head: Normocephalic and atraumatic.     Right Ear: Tympanic membrane, ear canal and external ear normal. No drainage or tenderness. No middle ear effusion. There is no impacted cerumen. Tympanic membrane is not erythematous or bulging.     Left Ear: Tympanic membrane, ear canal and external ear normal. No drainage or tenderness.  No middle ear effusion. There is no impacted cerumen. Tympanic membrane is not erythematous or bulging.     Nose: Congestion and rhinorrhea present.      Mouth/Throat:     Mouth: Mucous membranes are moist. No oral lesions.     Pharynx: Posterior oropharyngeal erythema (with associated pnd overlying pharynx) present. No pharyngeal swelling, oropharyngeal exudate or uvula swelling.     Tonsils: No tonsillar exudate or tonsillar abscesses.  Eyes:     General: No scleral icterus.       Right eye: No discharge.        Left eye: No discharge.     Extraocular Movements: Extraocular movements intact.     Right eye: Normal extraocular motion.     Left eye: Normal extraocular motion.     Conjunctiva/sclera: Conjunctivae normal.  Cardiovascular:     Rate and Rhythm: Normal rate and regular rhythm.     Heart sounds: Normal heart sounds. No murmur heard.    No friction rub. No gallop.  Pulmonary:     Effort: Pulmonary effort is normal. No respiratory distress.     Breath sounds: No stridor. No wheezing, rhonchi or rales.  Chest:     Chest wall: No tenderness.  Musculoskeletal:     Cervical back: Normal range of motion and neck supple.  Lymphadenopathy:     Cervical: No cervical adenopathy.  Skin:    General: Skin is warm and dry.  Neurological:     General: No focal deficit present.     Mental Status: She is alert and oriented to person, place, and time.  Psychiatric:        Mood and Affect: Mood normal.        Behavior: Behavior normal.     Assessment and Plan :   PDMP not reviewed this encounter.  1. Acute recurrent pansinusitis     Creatinine clearance 37-67mL/min.  Start Augmentin  for recurrent pansinusitis. Deferred imaging given clear cardiopulmonary exam, hemodynamically  stable vital signs.  Counseled patient on potential for adverse effects with medications prescribed/recommended today, ER and return-to-clinic precautions discussed, patient verbalized understanding.    Christopher Savannah, [place] 10/13/23 1219

## 2023-10-20 DIAGNOSIS — G43109 Migraine with aura, not intractable, without status migrainosus: Secondary | ICD-10-CM | POA: Diagnosis not present

## 2023-10-20 DIAGNOSIS — H43813 Vitreous degeneration, bilateral: Secondary | ICD-10-CM | POA: Diagnosis not present

## 2023-10-20 DIAGNOSIS — Z961 Presence of intraocular lens: Secondary | ICD-10-CM | POA: Diagnosis not present

## 2023-10-20 DIAGNOSIS — H26493 Other secondary cataract, bilateral: Secondary | ICD-10-CM | POA: Diagnosis not present

## 2023-10-20 DIAGNOSIS — E119 Type 2 diabetes mellitus without complications: Secondary | ICD-10-CM | POA: Diagnosis not present

## 2023-10-28 ENCOUNTER — Encounter: Payer: Self-pay | Admitting: Physician Assistant

## 2023-10-28 ENCOUNTER — Ambulatory Visit: Admitting: Physician Assistant

## 2023-10-28 VITALS — BP 102/60 | HR 64 | Ht 67.0 in | Wt 179.2 lb

## 2023-10-28 DIAGNOSIS — K641 Second degree hemorrhoids: Secondary | ICD-10-CM | POA: Diagnosis not present

## 2023-10-28 DIAGNOSIS — Z860101 Personal history of adenomatous and serrated colon polyps: Secondary | ICD-10-CM | POA: Diagnosis not present

## 2023-10-28 DIAGNOSIS — K582 Mixed irritable bowel syndrome: Secondary | ICD-10-CM | POA: Diagnosis not present

## 2023-10-28 DIAGNOSIS — R152 Fecal urgency: Secondary | ICD-10-CM

## 2023-10-28 DIAGNOSIS — M6289 Other specified disorders of muscle: Secondary | ICD-10-CM

## 2023-10-28 NOTE — Patient Instructions (Addendum)
 Start Benefiber or Citrucel 1 tablespoon in 8 ounces of liquid twice daily.  We have sent the following medications to your pharmacy for you to pick up at your convenience: Hydrocortisone suppository twice daily for 7 days, may repeat as needed.  _______________________________________________________  If your blood pressure at your visit was 140/90 or greater, please contact your primary care physician to follow up on this.  _______________________________________________________  If you are age 72 or older, your body mass index should be between 23-30. Your Body mass index is 28.07 kg/m. If this is out of the aforementioned range listed, please consider follow up with your Primary Care Provider.  If you are age 72 or younger, your body mass index should be between 19-25. Your Body mass index is 28.07 kg/m. If this is out of the aformentioned range listed, please consider follow up with your Primary Care Provider.   ________________________________________________________  The [facility] providers would like to encourage you to use MYCHART to communicate with providers for non-urgent requests or questions.  Due to long hold times on the telephone, sending your provider a message by [facility] may be a faster and more efficient way to get a response.  Please allow 48 business hours for a response.  Please remember that this is for non-urgent requests.  _______________________________________________________  [facility] is using a team-based approach to care.  Your team is made up of your doctor and two to three APPS. Our APPS (Nurse Practitioners and Physician Assistants) work with your physician to ensure care continuity for you. They are fully qualified to address your health concerns and develop a treatment plan. They communicate directly with your gastroenterologist to care for you. Seeing the Advanced Practice Practitioners on your physician's team can help you by facilitating  care more promptly, often allowing for earlier appointments, access to diagnostic testing, procedures, and other specialty referrals.

## 2023-10-28 NOTE — Progress Notes (Addendum)
 Chief Complaint: IBS-mixed  HPI:    Ashley Woodard is a 73 year old Caucasian female with a past medical history as listed below including CKD stage III, anxiety, A-fib (07/21/2023 echo with LVEF 60-65%) and CAD status post cath, known to Dr. Avram, who was referred to me by Ashley Woodard,* for a complaint of IBS-mixed.      10/06/2018 colonoscopy for surveillance with a history of adenomatous polyps on last colonoscopy 5 years prior with diverticulosis in the sigmoid colon otherwise normal.  Repeat recommended 7 years for surveillance given history of adenomas 1 each 2005 and 2010.  At that time recommended referral to pelvic floor PT for fecal and urinary incontinence.    06/24/2023 MRI of the abdomen with and without contrast was normal.    08/22/2023 CBC normal 7, BMP with a glucose of 106, BUN 30, creatinine 1.47.  TSH normal.  Hemoglobin A1c normal.    10/07/2023 and 10/13/2023 patient seen in the urgent care for recurrent pansinusitis.  Patient given Augmentin  for recurrent pansinusitis.    Today, the patient presents to clinic and tells me that she has maybe always had issues with her stools radiating back and forth with diarrhea but over the past 6 months has noticed that this has been more frequent and now she is having issues with urgency and fecal seepage/incontinence.  Tells me that often times she will have a bowel movement and then maybe 30 minutes later will find that she has produced some more mushy stool left over in her underwear.  This occurs maybe 2-3 times a week.  Explains that she was told she had a weak pelvic floor and did go to pelvic floor physical therapy for all of her sessions but it did not help at all.  She thinks I am too far gone.  Reports having to 10 pound babies in the past.  Patient also tried MiraLAX in the past but was told she can use this consistently due to her kidney disease.  Fiber supplement was recommended but she never tried.  Does have some days that are  better than others.  Also reports some hemorrhoids.    Denies fever, chills, weight loss or blood in her stool.  Past Medical History:  Diagnosis Date   Abnormal cardiovascular stress test 08/21/2016   a. done for abnl EKG/cardiac risk factors -> admitted for cath 07/2016 which showed no significant CAD, normal LV contraction, mildly elevated filling pressure. Low dose Imdur  was added for possible component of microvascular dysfunction.    Allergy    Anxiety    Arthritis    back, knees, fingers (05/10/2017)   Chronic kidney disease 2023   3b   Chronic lower back pain    Colon polyps    Deafness in right ear    Depression    Diverticulosis    Hx of adenomatous colonic polyps 05/08/2003   Hypertension    Hypertriglyceridemia    Hypothyroidism    Lupus (systemic lupus erythematosus) (HCC)    per pt   Migraine    none in the 2000s (05/10/2017)   Osteoporosis    Paroxysmal atrial fibrillation (HCC)    Pneumonia    several times (05/10/2017)   PONV (postoperative nausea and vomiting)    Presence of Watchman left atrial appendage closure device 03/04/2022   s/p LAAO with a 31 mm Watchman FLX by Dr. Cindie   S/P cardiac catheterization, 08/20/16 minimal CAD 08/21/2016   Sleep apnea    Type II  diabetes mellitus (HCC)    Voice tremor     Past Surgical History:  Procedure Laterality Date   ABDOMINAL HYSTERECTOMY  1988   ATRIAL FIBRILLATION ABLATION N/A 05/10/2017   Procedure: ATRIAL FIBRILLATION ABLATION;  Surgeon: Ashley Woodard;  Location: [facility];  Service: Cardiovascular;  Laterality: N/A;   CARDIAC CATHETERIZATION  2019   COCHLEAR IMPLANT Right 2008   COLONOSCOPY     FRACTURE SURGERY  01/16/20   LEFT ATRIAL APPENDAGE OCCLUSION N/A 03/04/2022   Procedure: LEFT ATRIAL APPENDAGE OCCLUSION;  Surgeon: Ashley Woodard;  Location: [facility];  Service: Cardiovascular;  Laterality: N/A;   LEFT HEART CATH AND CORONARY ANGIOGRAPHY N/A 08/20/2016    Procedure: Left Heart Cath and Coronary Angiography;  Surgeon: Ashley Woodard;  Location: [facility];  Service: Cardiovascular;  Laterality: N/A;   LOOP RECORDER INSERTION N/A 11/29/2017   Procedure: LOOP RECORDER INSERTION;  Surgeon: Ashley Woodard;  Location: [facility];  Service: Cardiovascular;  Laterality: N/A;   ORIF ANKLE FRACTURE Right 02/06/2019   Procedure: OPEN REDUCTION INTERNAL FIXATION (ORIF) ANKLE FRACTURE LATERAL MALLEOLUS, SYNDESMOSIS, RIGHT;  Surgeon: Ashley Woodard;  Location: [facility];  Service: Orthopedics;  Laterality: Right;  SURGERY REQUEST TIME: 1.5 HOURS  CPT CODES: 72207, 27829, 72301, 72389   TEE WITHOUT CARDIOVERSION N/A 03/04/2022   Procedure: TRANSESOPHAGEAL ECHOCARDIOGRAM (TEE);  Surgeon: Ashley Woodard;  Location: [facility];  Service: Cardiovascular;  Laterality: N/A;   watchmen procedure     02/2022    Current Outpatient Medications  Medication Sig Dispense Refill   amLODipine  (NORVASC ) 10 MG tablet Take 1 tablet (10 mg total) by mouth daily. 90 tablet 1   amoxicillin -clavulanate (AUGMENTIN ) 875-125 MG tablet Take 1 tablet by mouth 2 (two) times daily. 14 tablet 0   ASPIRIN  LOW DOSE 81 MG tablet TAKE 1 TABLET (81 MG TOTAL) BY MOUTH DAILY. SWALLOW WHOLE. 90 tablet 3   atorvastatin  (LIPITOR) 10 MG tablet TAKE 1 TABLET BY MOUTH EVERY DAY 90 tablet 3   Blood Glucose Calibration (ONETOUCH VERIO) SOLN 1 Act by In Vitro route in the morning and at bedtime. 1 each 5   Blood Glucose Monitoring Suppl (ONETOUCH VERIO FLEX SYSTEM) w/Device KIT 1 Act by Does not apply route in the morning and at bedtime. 1 kit 3   chlorthalidone (HYGROTON) 25 MG tablet Take 25 mg by mouth daily. 1/2 a tablet     diphenhydrAMINE  HCl, Sleep, (SLEEP-AID) 50 MG CAPS Take 50 mg by mouth at bedtime.     DULoxetine  (CYMBALTA ) 60 MG capsule TAKE 1 CAPSULE BY MOUTH EVERY DAY 90 capsule 3   FARXIGA  10 MG TABS tablet TAKE 1 TABLET BY  MOUTH DAILY BEFORE BREAKFAST. 90 tablet 1   glucose blood (ONETOUCH VERIO) test strip 1 each by Other route in the morning and at bedtime. Use as instructed 100 each 12   irbesartan  (AVAPRO ) 300 MG tablet TAKE 1 TABLET BY MOUTH EVERY DAY 90 tablet 1   Lancets (ONETOUCH ULTRASOFT) lancets 1 each by Other route in the morning and at bedtime. Use as instructed 100 each 5   levothyroxine  (SYNTHROID ) 75 MCG tablet TAKE 1 TABLET BY MOUTH EVERY DAY 90 tablet 3   metFORMIN  (GLUCOPHAGE ) 500 MG tablet Take 1 tablet (500 mg total) by mouth 2 (two) times daily with a meal. 180 tablet 0   metoprolol  tartrate (LOPRESSOR ) 25 MG tablet Take 1 tablet (25  mg total) by mouth 2 (two) times daily. 180 tablet 3   predniSONE  (DELTASONE ) 20 MG tablet Take 2 tablets daily with breakfast. 10 tablet 0   spironolactone  (ALDACTONE ) 25 MG tablet Take 25 mg by mouth once.     Current Facility-Administered Medications  Medication Dose Route Frequency Provider Last Rate Last Admin   botulinum toxin Type A  (BOTOX ) injection 100 Units  100 Units Intramuscular Once Onita Duos, Woodard        Allergies as of 10/28/2023 - Review Complete 10/13/2023  Allergen Reaction Noted   Amlodipine  Swelling 06/09/2020   Demeclocycline Rash 05/07/2014   Doxycycline Rash 05/07/2014   Erythromycin Rash 05/07/2014   Lisinopril Cough 06/29/2016   Tetracyclines & related Rash 05/07/2014    Family History  Problem Relation Age of Onset   Hypertension Mother    Diabetes Mother    Heart attack Mother    Colon polyps Mother    Arthritis Mother    Heart disease Mother    Vision loss Mother    Anemia Mother    Heart failure Mother    Hypertension Father    Heart attack Father    Stroke Father    Breast cancer Sister 65   Cancer Sister    Diabetes Sister    Breast cancer Maternal Aunt        in 30's   Breast cancer Maternal Aunt 50 - 59   Lung cancer Maternal Uncle    Cancer Maternal Uncle    Hypertension Brother    Diabetes Brother     Dementia Brother    Stroke Brother    Heart attack Brother    Prostate cancer Brother    Heart disease Brother    Kidney disease Brother    Heart failure Brother    Hypertension Son    Heart disease Brother    Hypertension Brother    Heart attack Brother    Heart failure Brother    Hearing loss Son    Hypertension Son    Colon cancer Neg Hx    Esophageal cancer Neg Hx    Rectal cancer Neg Hx    Stomach cancer Neg Hx     Social History   Socioeconomic History   Marital status: Married    Spouse name: Not on file   Number of children: 2   Years of education: 12   Highest education level: 12th grade  Occupational History   Occupation: Art therapist  Tobacco Use   Smoking status: Never   Smokeless tobacco: Never  Vaping Use   Vaping status: Never Used  Substance and Sexual Activity   Alcohol use: No   Drug use: No   Sexual activity: Not Currently    Birth control/protection: Abstinence, Post-menopausal  Other Topics Concern   Not on file  Social History Narrative   Lives at home with husband in [place].   Right-handed.   No caffeine use.   Manages a call center   Social Drivers of Health   Financial Resource Strain: Low Risk  (08/17/2023)   Overall Financial Resource Strain (CARDIA)    Difficulty of Paying Living Expenses: Not hard at all  Food Insecurity: No Food Insecurity (08/17/2023)   Hunger Vital Sign    Worried About Running Out of Food in the Last Year: Never true    Ran Out of Food in the Last Year: Never true  Transportation Needs: No Transportation Needs (08/17/2023)   PRAPARE - Transportation    Lack of  Transportation (Medical): No    Lack of Transportation (Non-Medical): No  Physical Activity: Insufficiently Active (08/17/2023)   Exercise Vital Sign    Days of Exercise per Week: 2 days    Minutes of Exercise per Session: 30 min  Stress: No Stress Concern Present (08/17/2023)   [employer] of Occupational Health - Occupational Stress  Questionnaire    Feeling of Stress: Not at all  Social Connections: Socially Integrated (08/17/2023)   Social Connection and Isolation Panel    Frequency of Communication with Friends and Family: Three times a week    Frequency of Social Gatherings with Friends and Family: Three times a week    Attends Religious Services: More than 4 times per year    Active Member of Clubs or Organizations: Yes    Attends Banker Meetings: More than 4 times per year    Marital Status: Married  Catering manager Violence: Not At Risk (07/08/2023)   Humiliation, Afraid, Rape, and Kick questionnaire    Fear of Current or Ex-Partner: No    Emotionally Abused: No    Physically Abused: No    Sexually Abused: No    Review of Systems:    Constitutional: No weight loss, fever or chills Skin: No rash Cardiovascular: No chest pain Respiratory: No SOB Gastrointestinal: See HPI and otherwise negative Genitourinary: No dysuria  Neurological: No headache, dizziness or syncope Musculoskeletal: No new muscle or joint pain Hematologic: No bleeding  Psychiatric: No history of depression or anxiety   Physical Exam:  Vital signs: BP 102/60   Pulse 64   Ht 5' 7 (1.702 m)   Wt 179 lb 4 oz (81.3 kg)   BMI 28.07 kg/m    Constitutional:   Pleasant elderly Caucasian female appears to be in NAD, Well developed, Well nourished, alert and cooperative Head:  Normocephalic and atraumatic. Eyes:   PEERL, EOMI. No icterus. Conjunctiva pink. Ears:  Normal auditory acuity. Neck:  Supple Throat: Oral cavity and pharynx without inflammation, swelling or lesion.  Respiratory: Respirations even and unlabored. Lungs clear to auscultation bilaterally.   No wheezes, crackles, or rhonchi.  Cardiovascular: Normal S1, S2. No MRG. Regular rate and rhythm. No peripheral edema, cyanosis or pallor.  Gastrointestinal:  Soft, nondistended, nontender. No rebound or guarding. Normal bowel sounds. No appreciable masses or  hepatomegaly. Rectal: External: Multiple external hemorrhoid tags; internal: 1 grade 2 hemorrhoid seen in canal, decreased sphincter tone Msk:  Symmetrical without gross deformities. Without edema, no deformity or joint abnormality.  Neurologic:  Alert and  oriented x4;  grossly normal neurologically.  Skin:   Dry and intact without significant lesions or rashes. Psychiatric: Demonstrates good judgement and reason without abnormal affect or behaviors.  See HPI for recent labs or imaging.  Assessment: 1.  IBS-mixed: Patient tells me that she radiates back-and-forth from constipation to loose sticky mushy stool, this has been going on for years, but now she is experiencing some fecal incontinence, during last colonoscopy in 2020 referred for pelvic floor physical therapy, she did go through this but did not feel like it helped at all, now with incontinence maybe 2-3 times a week; likely due to pelvic floor dysfunction +/- IBS +/- decreased sphincter tone and hemorrhoids 2.  History of adenomatous polyps: Last colonoscopy in 2020 with repeat recommended in 7 years 3.  Grade 2 hemorrhoid: Seen at time of exam today, could be contributing to symptoms of fecal incontinence  Plan: 1.  Recommend the patient start a fiber supplement such  as Benefiber twice daily 2.  Prescribed Hydrocortisone suppositories twice daily x 7 days #14 with 1 refill. 3.  Discussed that her symptoms are multifactorial and closely linked to her pelvic floor dysfunction.  Unfortunately pelvic floor physical therapy did not help her in the past.  If the above does not help could consider using a half dose of Imodium a day to help slow her stools for better control. 4.  Patient will follow-up in 2 to 3 months.  Will discuss symptoms then and also consider an earlier colonoscopy to rule out other etiology.  Ashley Failing, PA-C [facility] 10/28/2023, 9:11 AM    [facility] Attending    I agree with the Advanced  Practitioner's note, impression and recommendations with the following additions:  Depending upon results of these conservative measures, and especially if still having urinary incontinence as in 202, would refer to urogynecology for evaluation.  Lupita CHARLENA Commander, Woodard, NOLIA    Cc: Ashley Woodard,*

## 2023-10-31 ENCOUNTER — Encounter: Payer: Self-pay | Admitting: Family Medicine

## 2023-11-03 ENCOUNTER — Ambulatory Visit (INDEPENDENT_AMBULATORY_CARE_PROVIDER_SITE_OTHER): Admitting: Family Medicine

## 2023-11-03 ENCOUNTER — Encounter: Payer: Self-pay | Admitting: Family Medicine

## 2023-11-03 ENCOUNTER — Telehealth: Payer: Self-pay

## 2023-11-03 ENCOUNTER — Other Ambulatory Visit (HOSPITAL_COMMUNITY): Payer: Self-pay

## 2023-11-03 VITALS — BP 108/60 | HR 57 | Temp 97.6°F | Ht 67.0 in | Wt 177.6 lb

## 2023-11-03 DIAGNOSIS — N1832 Chronic kidney disease, stage 3b: Secondary | ICD-10-CM | POA: Diagnosis not present

## 2023-11-03 DIAGNOSIS — E1122 Type 2 diabetes mellitus with diabetic chronic kidney disease: Secondary | ICD-10-CM | POA: Diagnosis not present

## 2023-11-03 DIAGNOSIS — Z23 Encounter for immunization: Secondary | ICD-10-CM | POA: Diagnosis not present

## 2023-11-03 DIAGNOSIS — E119 Type 2 diabetes mellitus without complications: Secondary | ICD-10-CM

## 2023-11-03 DIAGNOSIS — M329 Systemic lupus erythematosus, unspecified: Secondary | ICD-10-CM | POA: Insufficient documentation

## 2023-11-03 MED ORDER — OZEMPIC (0.25 OR 0.5 MG/DOSE) 2 MG/3ML ~~LOC~~ SOPN: 0.2500 mg | PEN_INJECTOR | SUBCUTANEOUS | 0 refills | Status: DC

## 2023-11-03 NOTE — Progress Notes (Signed)
 Established Patient Office Visit   Subjective:  Patient ID: Ashley Woodard, female    DOB: Dec 18, 1950  Age: 73 y.o. MRN: [id number]  Chief Complaint  Patient presents with   New Med Request    Pt is requesting New Rx for Ozempic .     HPI Encounter Diagnoses  Name Primary?   Stage 3b chronic kidney disease (HCC) Yes   Type 2 diabetes mellitus without complication, without long-term current use of insulin  (HCC)    Immunization due    For follow-up of possible treatment with semaglutide  regarding her type 2 diabetes, CKD and overweight status.  She says that her nephrologist had mentioned it is a possibility in correspondence to her.  She continues on metformin .  She was told by her nephrologist that she may have lupus.   Review of Systems  Constitutional: Negative.   HENT: Negative.    Eyes:  Negative for blurred vision, discharge and redness.  Respiratory: Negative.    Cardiovascular: Negative.   Gastrointestinal:  Negative for abdominal pain.  Genitourinary: Negative.   Musculoskeletal: Negative.  Negative for myalgias.  Skin:  Negative for rash.  Neurological:  Negative for tingling, loss of consciousness and weakness.  Endo/Heme/Allergies:  Negative for polydipsia.     Current Outpatient Medications:    amLODipine  (NORVASC ) 10 MG tablet, Take 1 tablet (10 mg total) by mouth daily., Disp: 90 tablet, Rfl: 1   ASPIRIN  LOW DOSE 81 MG tablet, TAKE 1 TABLET (81 MG TOTAL) BY MOUTH DAILY. SWALLOW WHOLE., Disp: 90 tablet, Rfl: 3   atorvastatin  (LIPITOR) 10 MG tablet, TAKE 1 TABLET BY MOUTH EVERY DAY, Disp: 90 tablet, Rfl: 3   Blood Glucose Calibration (ONETOUCH VERIO) SOLN, 1 Act by In Vitro route in the morning and at bedtime., Disp: 1 each, Rfl: 5   Blood Glucose Monitoring Suppl (ONETOUCH VERIO FLEX SYSTEM) w/Device KIT, 1 Act by Does not apply route in the morning and at bedtime., Disp: 1 kit, Rfl: 3   chlorthalidone (HYGROTON) 25 MG tablet, Take 25 mg by mouth daily. 1/2 a  tablet, Disp: , Rfl:    diphenhydrAMINE  HCl, Sleep, (SLEEP-AID) 50 MG CAPS, Take 50 mg by mouth at bedtime., Disp: , Rfl:    DULoxetine  (CYMBALTA ) 60 MG capsule, TAKE 1 CAPSULE BY MOUTH EVERY DAY, Disp: 90 capsule, Rfl: 3   FARXIGA  10 MG TABS tablet, TAKE 1 TABLET BY MOUTH DAILY BEFORE BREAKFAST., Disp: 90 tablet, Rfl: 1   glucose blood (ONETOUCH VERIO) test strip, 1 each by Other route in the morning and at bedtime. Use as instructed, Disp: 100 each, Rfl: 12   irbesartan  (AVAPRO ) 300 MG tablet, TAKE 1 TABLET BY MOUTH EVERY DAY, Disp: 90 tablet, Rfl: 1   Lancets (ONETOUCH ULTRASOFT) lancets, 1 each by Other route in the morning and at bedtime. Use as instructed, Disp: 100 each, Rfl: 5   levothyroxine  (SYNTHROID ) 75 MCG tablet, TAKE 1 TABLET BY MOUTH EVERY DAY, Disp: 90 tablet, Rfl: 3   metFORMIN  (GLUCOPHAGE ) 500 MG tablet, Take 1 tablet (500 mg total) by mouth 2 (two) times daily with a meal., Disp: 180 tablet, Rfl: 0   metoprolol  tartrate (LOPRESSOR ) 25 MG tablet, Take 1 tablet (25 mg total) by mouth 2 (two) times daily., Disp: 180 tablet, Rfl: 3   Semaglutide ,0.25 or 0.5MG /DOS, (OZEMPIC , 0.25 OR 0.5 MG/DOSE,) 2 MG/3ML SOPN, Inject 0.25 mg into the skin once a week., Disp: 3 mL, Rfl: 0   spironolactone  (ALDACTONE ) 25 MG tablet, Take 25 mg by mouth  once., Disp: , Rfl:   Current Facility-Administered Medications:    botulinum toxin Type A  (BOTOX ) injection 100 Units, 100 Units, Intramuscular, Once, Onita Duos, MD   Objective:     BP 108/60 (BP Location: Left Arm, Patient Position: Sitting, Cuff Size: Normal)   Pulse (!) 57   Temp 97.6 F (36.4 C) (Temporal)   Ht 5' 7 (1.702 m)   Wt 177 lb 9.6 oz (80.6 kg)   SpO2 94%   BMI 27.82 kg/m    Physical Exam Constitutional:      General: She is not in acute distress.    Appearance: Normal appearance. She is not ill-appearing, toxic-appearing or diaphoretic.  HENT:     Head: Normocephalic and atraumatic.     Right Ear: External ear normal.      Left Ear: External ear normal.  Eyes:     General: No scleral icterus.       Right eye: No discharge.        Left eye: No discharge.     Extraocular Movements: Extraocular movements intact.     Conjunctiva/sclera: Conjunctivae normal.  Pulmonary:     Effort: Pulmonary effort is normal. No respiratory distress.  Skin:    General: Skin is warm and dry.  Neurological:     Mental Status: She is alert and oriented to person, place, and time.  Psychiatric:        Mood and Affect: Mood normal.        Behavior: Behavior normal.      No results found for any visits on 11/03/23.    The ASCVD Risk score (Arnett DK, et al., 2019) failed to calculate for the following reasons:   The valid total cholesterol range is 130 to 320 mg/dL    Assessment & Plan:   Stage 3b chronic kidney disease (HCC) -     Ozempic  (0.25 or 0.5 MG/DOSE); Inject 0.25 mg into the skin once a week.  Dispense: 3 mL; Refill: 0  Type 2 diabetes mellitus without complication, without long-term current use of insulin  (HCC) -     Ozempic  (0.25 or 0.5 MG/DOSE); Inject 0.25 mg into the skin once a week.  Dispense: 3 mL; Refill: 0  Immunization due -     Flu vaccine HIGH DOSE PF(Fluzone Trivalent)    Return in about 3 months (around 02/02/2024), or May discontinue metformin  after starting semaglutide .    Elsie Sim Lent, MD

## 2023-11-03 NOTE — Telephone Encounter (Signed)
 Pharmacy Patient Advocate Encounter   Received notification from CoverMyMeds that prior authorization for Ozempic  (0.25 or 0.5 MG/DOSE) 2MG /3ML pen-injectors is required/requested.   Insurance verification completed.   The patient is insured through [facility] ADVANTAGE/RX ADVANCE .   Per test claim: PA required; PA submitted to above mentioned insurance via [employer]/confirmation #/EOC [employer] Status is pending

## 2023-11-04 ENCOUNTER — Other Ambulatory Visit (HOSPITAL_COMMUNITY): Payer: Self-pay

## 2023-11-04 NOTE — Telephone Encounter (Signed)
 Pharmacy Patient Advocate Encounter  Received notification from [employer] ADV[employer] that Prior Authorization for Ozempic  (0.25 or 0.5 MG/DOSE) 2MG /3ML pen-injectors  has been APPROVED from 11/04/2023 to 11/03/2024. Ran test claim, Copay is $15.67. This test claim was processed through [facility]- copay amounts may vary at other pharmacies due to pharmacy/plan contracts, or as the patient moves through the different stages of their insurance plan.    PA #/Case ID/Reference #: [id number]

## 2023-11-18 ENCOUNTER — Other Ambulatory Visit: Payer: Self-pay | Admitting: Family Medicine

## 2023-11-18 DIAGNOSIS — N1832 Chronic kidney disease, stage 3b: Secondary | ICD-10-CM

## 2023-11-30 ENCOUNTER — Ambulatory Visit: Payer: Self-pay

## 2023-11-30 ENCOUNTER — Telehealth (HOSPITAL_COMMUNITY): Payer: Self-pay | Admitting: Family Medicine

## 2023-11-30 ENCOUNTER — Encounter: Payer: Self-pay | Admitting: Family Medicine

## 2023-11-30 ENCOUNTER — Other Ambulatory Visit (HOSPITAL_COMMUNITY): Payer: Self-pay | Admitting: Family Medicine

## 2023-11-30 ENCOUNTER — Ambulatory Visit (INDEPENDENT_AMBULATORY_CARE_PROVIDER_SITE_OTHER): Admitting: Family Medicine

## 2023-11-30 VITALS — BP 122/68 | HR 84 | Temp 97.8°F | Resp 16 | Ht 67.0 in | Wt 172.2 lb

## 2023-11-30 DIAGNOSIS — E860 Dehydration: Secondary | ICD-10-CM | POA: Insufficient documentation

## 2023-11-30 DIAGNOSIS — T50905A Adverse effect of unspecified drugs, medicaments and biological substances, initial encounter: Secondary | ICD-10-CM

## 2023-11-30 DIAGNOSIS — R1013 Epigastric pain: Secondary | ICD-10-CM

## 2023-11-30 DIAGNOSIS — I10 Essential (primary) hypertension: Secondary | ICD-10-CM | POA: Diagnosis not present

## 2023-11-30 MED ORDER — PANTOPRAZOLE SODIUM 40 MG PO TBEC: 40.0000 mg | DELAYED_RELEASE_TABLET | Freq: Every day | ORAL | 1 refills | Status: DC

## 2023-11-30 NOTE — Patient Instructions (Signed)
 Give us  2-3 business days to get the results of your labs back.   Try to drink 55-60 oz of water daily outside of exercise.  Stop your spironolactone  for now.   Continue monitoring your blood pressure.  Go back on pepcid .   The only lifestyle changes that have data behind them are weight loss for the overweight/obese and elevating the head of the bed. Finding out which foods/positions are triggers is important.  Let us  know if you need anything.

## 2023-11-30 NOTE — Telephone Encounter (Signed)
 Patient referred to infusion pharmacy team for ambulatory infusion of IV fluids.  Insurance - Sales executive of care - Site of care: [facility] Dx code - E86.0 IV Iron Therapy - Provider authorized 1 liter NS x 1 to be given in the infusion center. Infusion appointments - Scheduling team will schedule patient as soon as possible.    Thank you,  Norton Blush, PharmD, BCSCP Pharmacist [facility]

## 2023-11-30 NOTE — Progress Notes (Signed)
 Chief Complaint  Patient presents with   Fatigue    Fatigue    Subjective Ashley Woodard is a 73 y.o. female who presents for hypertension follow up. She does monitor home blood pressures. Blood pressures ranging from 80-120's/60's on average. She is compliant with medications- metoprolol  25 mg bid, Aldactone  25 mg/d, Norvasc  10 mg/d, irbesartan  300 mg/d. Was told to stop her chlorthalidone 12.5 mg/d.  Patient has these side effects of medication: none She has not been eating or drinking as much since starting the Ozempic  around 1 mo ago. Stopped 3 weeks ago. Still having belching, upper abd pain, pain w eating. Slightly better since stopping but still bothersome.  Current exercise: limited No CP or SOB.    Past Medical History:  Diagnosis Date   Abnormal cardiovascular stress test 08/21/2016   a. done for abnl EKG/cardiac risk factors -> admitted for cath 07/2016 which showed no significant CAD, normal LV contraction, mildly elevated filling pressure. Low dose Imdur  was added for possible component of microvascular dysfunction.    Allergy    Anxiety    Arthritis    back, knees, fingers (05/10/2017)   Chronic kidney disease 2023   3b   Chronic lower back pain    Colon polyps    Deafness in right ear    Depression    Diverticulosis    Hx of adenomatous colonic polyps 05/08/2003   Hypertension    Hypertriglyceridemia    Hypothyroidism    Lupus (systemic lupus erythematosus) (HCC)    per pt   Migraine    none in the 2000s (05/10/2017)   Osteoporosis    Paroxysmal atrial fibrillation (HCC)    Pneumonia    several times (05/10/2017)   PONV (postoperative nausea and vomiting)    Presence of Watchman left atrial appendage closure device 03/04/2022   s/p LAAO with a 31 mm Watchman FLX by Dr. Cindie   S/P cardiac catheterization, 08/20/16 minimal CAD 08/21/2016   Sleep apnea    Type II diabetes mellitus (HCC)    Voice tremor     Exam BP 122/68 (BP Location: Left Arm, Patient  Position: Sitting)   Pulse 84   Temp 97.8 F (36.6 C) (Oral)   Resp 16   Ht 5' 7 (1.702 m)   Wt 172 lb 3.2 oz (78.1 kg)   SpO2 96%   BMI 26.97 kg/m  General:  well developed, well nourished, in no apparent distress Mouth: MMD Abd: BS+, S, ND, mild ttp in epigastric region Heart: RRR, no bruits, no LE edema Lungs: clear to auscultation, no accessory muscle use Psych: well oriented with normal range of affect and appropriate judgment/insight  Primary hypertension - Plan: CBC, Comprehensive metabolic panel with GFR  Dyspepsia - Plan: pantoprazole  (PROTONIX ) 40 MG tablet  Dehydration  Adverse effect of drug, initial encounter  Chronic, unstable. Stop Aldactone  for now. Counseled on diet and exercise. Cont Irbesartan  300 mg/d, Norvasc  10 mg/d, metoprolol  25 mg bid. Ck labs. Will ensure she is not having hyperkalemia after stopping the chlorthalidone. Monitor BP at home. Things should normalize as she starts eating/drinking again. Should normalize with time as she gets further from the Ozempic . Cont Pepcid . Add Protonix  for the next couple weeks.  We will get her in with the infusion clinic for 1 L of NS tomorrow.  As above.  F/u in 2 weeks w Dr. Berneta. The patient voiced understanding and agreement to the plan.  Mabel [facility] [place], DO 11/30/23  3:15 PM

## 2023-11-30 NOTE — Telephone Encounter (Addendum)
 FYI Only or Action Required?: FYI only for provider.  Patient was last seen in primary care on 11/03/2023 by [name], MD.  Called Nurse Triage reporting Fatigue and Hypotension.  Symptoms began several weeks ago.  Interventions attempted: OTC medications: Stopped taking Chlorthalidone on Thursday per nephrology recs for low BP, and Rest, hydration, or home remedies.  Symptoms are: gradually worsening.  Triage Disposition: See HCP Within 4 Hours (Or PCP Triage)  Patient/caregiver understands and will follow disposition?: Yes  Reports lightheadedness occurs when she first stands up, then goes away. Reports mild nausea without emesis. No SOB or CP. Appt scheduled for today at [facility]. Pt states she has someone that can drive her.   Copied from CRM [id number]. Topic: Clinical - Red Word Triage >> Nov 30, 2023 12:14 PM [place] J wrote: [facility] that prompted transfer to Nurse Triage: Patient is having extreme fatigue, nausea, and dizziness.  Reason for Disposition  [1] Systolic BP 90-110 AND [2] taking blood pressure medications AND [3] feeling weak or lightheaded  Answer Assessment - Initial Assessment Questions 1. BLOOD PRESSURE: What is your blood pressure? Did you take at least two measurements 5 minutes apart?     1: BP 101/56, HR of 50.        2: BP 84/58, HR of 84     3. BP 91/56, HR 53  2. ONSET: When did you take your blood pressure?     Now   3. HOW: How did you take your blood pressure? (e.g., visiting nurse, automatic home BP monitor)     Automatic home cuff.  4. HISTORY: Do you have a history of low blood pressure? What is your blood pressure normally?     Normal SBP 115  5. MEDICINES: Are you taking any medicines for blood pressure? If Yes, ask: Have they been changed recently?     Taking metoprolol , irbesartan , amlodipine , spironolactone  and chlorthalidone. Stopped taking the chlorthalidone lest Thursday per nephrology d/t low  BP.  6. PULSE RATE: Do you know what your pulse rate is?      HR 66, baseline per pt.  7. OTHER SYMPTOMS: Have you been sick recently? Have you had a recent injury?     Mild-moderate dizziness. Severe fatigue. Onset severe heartburn after starting Ozempic  2.5 weeks ago. Took dose 1x, then stopped d/t diarrhea. BM's now normal. Has been off of Ozempic  since.  Protocols used: Blood Pressure - Low-A-AH

## 2023-12-01 ENCOUNTER — Ambulatory Visit (HOSPITAL_COMMUNITY)
Admission: RE | Admit: 2023-12-01 | Discharge: 2023-12-01 | Disposition: A | Discharge status: Home / Self Care | Source: Ambulatory Visit | Attending: Family Medicine | Admitting: Family Medicine

## 2023-12-01 ENCOUNTER — Ambulatory Visit: Payer: Self-pay | Admitting: Family Medicine

## 2023-12-01 VITALS — BP 100/51 | HR 52 | Temp 97.7°F | Resp 16

## 2023-12-01 DIAGNOSIS — E860 Dehydration: Secondary | ICD-10-CM | POA: Diagnosis not present

## 2023-12-01 LAB — COMPREHENSIVE METABOLIC PANEL WITH GFR
ALT: 20 U/L (ref 0–35)
AST: 12 U/L (ref 0–37)
Albumin: 4.6 g/dL (ref 3.5–5.2)
Alkaline Phosphatase: 64 U/L (ref 39–117)
BUN: 62 mg/dL — ABNORMAL HIGH (ref 6–23)
CO2: 20 meq/L (ref 19–32)
Calcium: 10.3 mg/dL (ref 8.4–10.5)
Chloride: 108 meq/L (ref 96–112)
Creatinine, Ser: 2.44 mg/dL — ABNORMAL HIGH (ref 0.40–1.20)
GFR: 19.29 mL/min — ABNORMAL LOW
Glucose, Bld: 105 mg/dL — ABNORMAL HIGH (ref 70–99)
Potassium: 5.7 meq/L — ABNORMAL HIGH (ref 3.5–5.1)
Sodium: 136 meq/L (ref 135–145)
Total Bilirubin: 0.5 mg/dL (ref 0.2–1.2)
Total Protein: 6.9 g/dL (ref 6.0–8.3)

## 2023-12-01 LAB — CBC
HCT: 44 % (ref 36.0–46.0)
Hemoglobin: 14.2 g/dL (ref 12.0–15.0)
MCHC: 32.4 g/dL (ref 30.0–36.0)
MCV: 88.3 fl (ref 78.0–100.0)
Platelets: 359 K/uL (ref 150.0–400.0)
RBC: 4.98 Mil/uL (ref 3.87–5.11)
RDW: 15.3 % (ref 11.5–15.5)
WBC: 8.3 K/uL (ref 4.0–10.5)

## 2023-12-01 MED ORDER — SODIUM CHLORIDE 0.9 % IV BOLUS
1000.0000 mL | Freq: Once | INTRAVENOUS | Status: AC
  Administered 2023-12-01: 1000 mL via INTRAVENOUS

## 2023-12-02 ENCOUNTER — Other Ambulatory Visit (INDEPENDENT_AMBULATORY_CARE_PROVIDER_SITE_OTHER)

## 2023-12-02 ENCOUNTER — Other Ambulatory Visit: Payer: Self-pay

## 2023-12-02 ENCOUNTER — Telehealth: Payer: Self-pay

## 2023-12-02 DIAGNOSIS — E876 Hypokalemia: Secondary | ICD-10-CM | POA: Diagnosis not present

## 2023-12-02 NOTE — Telephone Encounter (Signed)
 Copied from CRM [id number]. Topic: General - Other >> Dec 02, 2023 10:57 AM Vena HERO wrote: Reason for CRM: pt calling in to ask Eliaz Fout to call her to schedule labs  Called pt lab appt scheduled.

## 2023-12-03 ENCOUNTER — Ambulatory Visit: Payer: Self-pay | Admitting: Family Medicine

## 2023-12-03 LAB — BASIC METABOLIC PANEL WITH GFR
BUN/Creatinine Ratio: 23 (calc) — ABNORMAL HIGH (ref 6–22)
BUN: 51 mg/dL — ABNORMAL HIGH (ref 7–25)
CO2: 20 mmol/L (ref 20–32)
Calcium: 9.7 mg/dL (ref 8.6–10.4)
Chloride: 113 mmol/L — ABNORMAL HIGH (ref 98–110)
Creat: 2.26 mg/dL — ABNORMAL HIGH (ref 0.60–1.00)
Glucose, Bld: 144 mg/dL — ABNORMAL HIGH (ref 65–99)
Potassium: 5.4 mmol/L — ABNORMAL HIGH (ref 3.5–5.3)
Sodium: 139 mmol/L (ref 135–146)
eGFR: 23 mL/min/1.73m2 — ABNORMAL LOW (ref >=60)

## 2023-12-06 ENCOUNTER — Other Ambulatory Visit: Payer: Self-pay | Admitting: Family Medicine

## 2023-12-06 DIAGNOSIS — E119 Type 2 diabetes mellitus without complications: Secondary | ICD-10-CM

## 2023-12-06 DIAGNOSIS — N1832 Chronic kidney disease, stage 3b: Secondary | ICD-10-CM

## 2023-12-08 ENCOUNTER — Other Ambulatory Visit: Payer: Self-pay | Admitting: Family Medicine

## 2023-12-08 DIAGNOSIS — I10 Essential (primary) hypertension: Secondary | ICD-10-CM

## 2023-12-20 ENCOUNTER — Encounter: Payer: Self-pay | Admitting: Family Medicine

## 2023-12-20 ENCOUNTER — Ambulatory Visit (INDEPENDENT_AMBULATORY_CARE_PROVIDER_SITE_OTHER): Admitting: Family Medicine

## 2023-12-20 VITALS — BP 130/72 | HR 55 | Temp 97.3°F | Ht 67.0 in | Wt 179.8 lb

## 2023-12-20 DIAGNOSIS — N1832 Chronic kidney disease, stage 3b: Secondary | ICD-10-CM | POA: Diagnosis not present

## 2023-12-20 DIAGNOSIS — R7689 Other specified abnormal immunological findings in serum: Secondary | ICD-10-CM | POA: Diagnosis not present

## 2023-12-20 DIAGNOSIS — N179 Acute kidney failure, unspecified: Secondary | ICD-10-CM | POA: Diagnosis not present

## 2023-12-20 DIAGNOSIS — E785 Hyperlipidemia, unspecified: Secondary | ICD-10-CM | POA: Diagnosis not present

## 2023-12-20 DIAGNOSIS — M329 Systemic lupus erythematosus, unspecified: Secondary | ICD-10-CM

## 2023-12-20 DIAGNOSIS — E1122 Type 2 diabetes mellitus with diabetic chronic kidney disease: Secondary | ICD-10-CM | POA: Diagnosis not present

## 2023-12-20 DIAGNOSIS — E119 Type 2 diabetes mellitus without complications: Secondary | ICD-10-CM

## 2023-12-20 DIAGNOSIS — I10 Essential (primary) hypertension: Secondary | ICD-10-CM

## 2023-12-20 DIAGNOSIS — Z7984 Long term (current) use of oral hypoglycemic drugs: Secondary | ICD-10-CM | POA: Diagnosis not present

## 2023-12-20 DIAGNOSIS — D631 Anemia in chronic kidney disease: Secondary | ICD-10-CM | POA: Diagnosis not present

## 2023-12-20 DIAGNOSIS — I129 Hypertensive chronic kidney disease with stage 1 through stage 4 chronic kidney disease, or unspecified chronic kidney disease: Secondary | ICD-10-CM | POA: Diagnosis not present

## 2023-12-20 DIAGNOSIS — N2581 Secondary hyperparathyroidism of renal origin: Secondary | ICD-10-CM | POA: Diagnosis not present

## 2023-12-20 DIAGNOSIS — N189 Chronic kidney disease, unspecified: Secondary | ICD-10-CM | POA: Diagnosis not present

## 2023-12-20 LAB — BASIC METABOLIC PANEL WITH GFR
BUN: 30 mg/dL — ABNORMAL HIGH (ref 6–23)
CO2: 26 meq/L (ref 19–32)
Calcium: 9.8 mg/dL (ref 8.4–10.5)
Chloride: 103 meq/L (ref 96–112)
Creatinine, Ser: 1.81 mg/dL — ABNORMAL HIGH (ref 0.40–1.20)
GFR: 27.59 mL/min — ABNORMAL LOW (ref >60.00)
Glucose, Bld: 103 mg/dL — ABNORMAL HIGH (ref 70–99)
Potassium: 4.1 meq/L (ref 3.5–5.1)
Sodium: 138 meq/L (ref 135–145)

## 2023-12-20 LAB — HEMOGLOBIN A1C: Hgb A1c MFr Bld: 6.7 % — ABNORMAL HIGH (ref 4.6–6.5)

## 2023-12-20 NOTE — Progress Notes (Signed)
 Established Patient Office Visit   Subjective:  Patient ID: Ashley Woodard, female    DOB: 24-Apr-1950  Age: 73 y.o. MRN: [id number]  Chief Complaint  Patient presents with   Hypertension    Pt states her blood is now elevated now. She started back taking her bp medications.     HPI Encounter Diagnoses  Name Primary?   Primary hypertension Yes   Type 2 diabetes mellitus without complication, without long-term current use of insulin  (HCC)    Stage 3b chronic kidney disease (HCC)    History of systemic lupus erythematosus (HCC)    Essential hypertension    For follow-up of above.  Currently continues Avapro  300 mg, Aldactone  25 mg, amlodipine  10 mg, and chlorthalidone 25 mg daily for her controlled blood pressure.  Last creatinine was 2.6 with a GFR of 23.  She did not tolerate the Ozempic .  She developed indigestion and stomach pain with it.  Looking back on that time, she wonders if she were in a lupus flare.   Review of Systems  Constitutional: Negative.   HENT: Negative.    Eyes:  Negative for blurred vision, discharge and redness.  Respiratory: Negative.    Cardiovascular: Negative.   Gastrointestinal:  Negative for abdominal pain.  Genitourinary: Negative.   Musculoskeletal: Negative.  Negative for myalgias.  Skin:  Negative for rash.  Neurological:  Negative for tingling, loss of consciousness and weakness.  Endo/Heme/Allergies:  Negative for polydipsia.     Current Outpatient Medications:    amLODipine  (NORVASC ) 10 MG tablet, TAKE 1 TABLET BY MOUTH EVERY DAY, Disp: 90 tablet, Rfl: 1   ASPIRIN  LOW DOSE 81 MG tablet, TAKE 1 TABLET (81 MG TOTAL) BY MOUTH DAILY. SWALLOW WHOLE., Disp: 90 tablet, Rfl: 3   atorvastatin  (LIPITOR) 10 MG tablet, TAKE 1 TABLET BY MOUTH EVERY DAY, Disp: 90 tablet, Rfl: 3   Blood Glucose Calibration (ONETOUCH VERIO) SOLN, 1 Act by In Vitro route in the morning and at bedtime., Disp: 1 each, Rfl: 5   Blood Glucose Monitoring Suppl (ONETOUCH VERIO FLEX  SYSTEM) w/Device KIT, 1 Act by Does not apply route in the morning and at bedtime., Disp: 1 kit, Rfl: 3   diphenhydrAMINE  HCl, Sleep, (SLEEP-AID) 50 MG CAPS, Take 50 mg by mouth at bedtime., Disp: , Rfl:    DULoxetine  (CYMBALTA ) 60 MG capsule, TAKE 1 CAPSULE BY MOUTH EVERY DAY, Disp: 90 capsule, Rfl: 3   FARXIGA  10 MG TABS tablet, TAKE 1 TABLET BY MOUTH DAILY BEFORE BREAKFAST., Disp: 90 tablet, Rfl: 1   glucose blood (ONETOUCH VERIO) test strip, 1 each by Other route in the morning and at bedtime. Use as instructed, Disp: 100 each, Rfl: 12   irbesartan  (AVAPRO ) 300 MG tablet, TAKE 1 TABLET BY MOUTH EVERY DAY, Disp: 90 tablet, Rfl: 1   Lancets (ONETOUCH ULTRASOFT) lancets, 1 each by Other route in the morning and at bedtime. Use as instructed, Disp: 100 each, Rfl: 5   levothyroxine  (SYNTHROID ) 75 MCG tablet, TAKE 1 TABLET BY MOUTH EVERY DAY, Disp: 90 tablet, Rfl: 3   metoprolol  tartrate (LOPRESSOR ) 25 MG tablet, Take 1 tablet (25 mg total) by mouth 2 (two) times daily., Disp: 180 tablet, Rfl: 3   pantoprazole  (PROTONIX ) 40 MG tablet, Take 1 tablet (40 mg total) by mouth daily., Disp: 30 tablet, Rfl: 1   spironolactone  (ALDACTONE ) 25 MG tablet, Take 25 mg by mouth once., Disp: , Rfl:   Current Facility-Administered Medications:    botulinum toxin Type A  (BOTOX )  injection 100 Units, 100 Units, Intramuscular, Once, Onita Duos, MD   Objective:     BP 130/72 (BP Location: Left Arm, Patient Position: Sitting, Cuff Size: Normal)   Pulse (!) 55   Temp (!) 97.3 F (36.3 C) (Temporal)   Ht 5' 7 (1.702 m)   Wt 179 lb 12.8 oz (81.6 kg)   SpO2 98%   BMI 28.16 kg/m    Physical Exam Constitutional:      General: She is not in acute distress.    Appearance: Normal appearance. She is not ill-appearing, toxic-appearing or diaphoretic.  HENT:     Head: Normocephalic and atraumatic.     Right Ear: External ear normal.     Left Ear: External ear normal.  Eyes:     General: No scleral icterus.        Right eye: No discharge.        Left eye: No discharge.     Extraocular Movements: Extraocular movements intact.     Conjunctiva/sclera: Conjunctivae normal.  Pulmonary:     Effort: Pulmonary effort is normal. No respiratory distress.  Skin:    General: Skin is warm and dry.  Neurological:     Mental Status: She is alert and oriented to person, place, and time.  Psychiatric:        Mood and Affect: Mood normal.        Behavior: Behavior normal.      No results found for any visits on 12/20/23.    The ASCVD Risk score (Arnett DK, et al., 2019) failed to calculate for the following reasons:   The valid total cholesterol range is 130 to 320 mg/dL    Assessment & Plan:   Primary hypertension -     Basic metabolic panel with GFR  Type 2 diabetes mellitus without complication, without long-term current use of insulin  (HCC) -     Basic metabolic panel with GFR -     Hemoglobin A1c  Stage 3b chronic kidney disease (HCC) -     Basic metabolic panel with GFR  History of systemic lupus erythematosus (HCC) -     Ambulatory referral to Rheumatology  Essential hypertension    Return in about 3 months (around 03/21/2024), or Stop metformin .  Continue Farxiga .  Check and record fasting sugars periodically..  Advised patient that it would be best for nephrology to manage her hypertension.  She agreed.  Along with her CKD 3B she is on 2 potassium sparing antihypertensives.  It may be best for both of these to be discontinued.  Sees her nephrologist this afternoon.  Discontinued metformin  today.  Believes that she may have been in a lupus flare when she started the Ozempic .  We could consider Rybelsus.  She is currently not seeing a rheumatologist.  Elsie Sim Lent, MD

## 2023-12-22 ENCOUNTER — Ambulatory Visit: Payer: Self-pay | Admitting: Family Medicine

## 2023-12-22 ENCOUNTER — Other Ambulatory Visit: Payer: Self-pay | Admitting: Family Medicine

## 2023-12-22 DIAGNOSIS — R1013 Epigastric pain: Secondary | ICD-10-CM

## 2023-12-27 NOTE — Progress Notes (Unsigned)
 Chief Complaint: Follow-up IBS mixed  HPI:    Ashley Woodard is a 73 year old Caucasian female with past medical history as listed below including CKD stage III, anxiety, A-fib (07/21/2023 echo with LVEF 60-65%) and CAD status post cath, known to Dr. Avram, who returns to clinic today for follow-up of IBS-mixed.     10/06/2018 colonoscopy for surveillance with a history of adenomatous polyps on last colonoscopy 5 years prior with diverticulosis in the sigmoid colon otherwise normal.  Repeat recommended 7 years for surveillance given history of adenomas 1 each 2005 and 2010.  At that time recommended referral to pelvic floor PT for fecal and urinary incontinence.    06/24/2023 MRI of the abdomen with and without contrast was normal.    08/22/2023 CBC normal 7, BMP with a glucose of 106, BUN 30, creatinine 1.47.  TSH normal.  Hemoglobin A1c normal.    10/07/2023 and 10/13/2023 patient seen in the urgent care for recurrent pansinusitis.  Patient given Augmentin  for recurrent pansinusitis.    10/28/2023 patient seen in clinic and at that time discussed issues with radiating stools back-and-forth from diarrhea to constipation.  At that time rectal exam with multiple external hemorrhoid tags 1 grade 2 hemorrhoid and decreased sphincter tone.  Recommended fiber supplement, hydrocortisone suppositories twice daily and discussed multifactorial symptoms closely linked to pelvic floor dysfunction, pelvic floor therapy had not worked in the past.  Discussed half a dose of Imodium a day to help slow her stools.  Patient instructed to follow-up in 2 to 3 months, if symptoms continued can consider an earlier colonoscopy to rule out other etiology.  Past Medical History:  Diagnosis Date   Abnormal cardiovascular stress test 08/21/2016   a. done for abnl EKG/cardiac risk factors -> admitted for cath 07/2016 which showed no significant CAD, normal LV contraction, mildly elevated filling pressure. Low dose Imdur  was added for  possible component of microvascular dysfunction.    Allergy    Anxiety    Arthritis    back, knees, fingers (05/10/2017)   Chronic kidney disease 2023   3b   Chronic lower back pain    Colon polyps    Deafness in right ear    Depression    Diverticulosis    Hx of adenomatous colonic polyps 05/08/2003   Hypertension    Hypertriglyceridemia    Hypothyroidism    Lupus (systemic lupus erythematosus) (HCC)    per pt   Migraine    none in the 2000s (05/10/2017)   Osteoporosis    Paroxysmal atrial fibrillation (HCC)    Pneumonia    several times (05/10/2017)   PONV (postoperative nausea and vomiting)    Presence of Watchman left atrial appendage closure device 03/04/2022   s/p LAAO with a 31 mm Watchman FLX by Dr. Cindie   S/P cardiac catheterization, 08/20/16 minimal CAD 08/21/2016   Sleep apnea    Type II diabetes mellitus (HCC)    Voice tremor     Past Surgical History:  Procedure Laterality Date   ABDOMINAL HYSTERECTOMY  1988   ATRIAL FIBRILLATION ABLATION N/A 05/10/2017   Procedure: ATRIAL FIBRILLATION ABLATION;  Surgeon: Kelsie Agent, MD;  Location: [facility];  Service: Cardiovascular;  Laterality: N/A;   CARDIAC CATHETERIZATION  2019   COCHLEAR IMPLANT Right 2008   COLONOSCOPY     FRACTURE SURGERY  01/16/20   LEFT ATRIAL APPENDAGE OCCLUSION N/A 03/04/2022   Procedure: LEFT ATRIAL APPENDAGE OCCLUSION;  Surgeon: Cindie Ole DASEN, MD;  Location: [facility]  INVASIVE CV LAB;  Service: Cardiovascular;  Laterality: N/A;   LEFT HEART CATH AND CORONARY ANGIOGRAPHY N/A 08/20/2016   Procedure: Left Heart Cath and Coronary Angiography;  Surgeon: Mady Bruckner, MD;  Location: [facility];  Service: Cardiovascular;  Laterality: N/A;   LOOP RECORDER INSERTION N/A 11/29/2017   Procedure: LOOP RECORDER INSERTION;  Surgeon: Kelsie Agent, MD;  Location: [facility];  Service: Cardiovascular;  Laterality: N/A;   ORIF ANKLE FRACTURE Right 02/06/2019   Procedure:  OPEN REDUCTION INTERNAL FIXATION (ORIF) ANKLE FRACTURE LATERAL MALLEOLUS, SYNDESMOSIS, RIGHT;  Surgeon: Elsa Bruckner SAUNDERS, MD;  Location: [facility];  Service: Orthopedics;  Laterality: Right;  SURGERY REQUEST TIME: 1.5 HOURS  CPT CODES: 72207, 27829, 72301, 72389   TEE WITHOUT CARDIOVERSION N/A 03/04/2022   Procedure: TRANSESOPHAGEAL ECHOCARDIOGRAM (TEE);  Surgeon: Cindie Ole DASEN, MD;  Location: [facility] INVAS[facility];  Service: Cardiovascular;  Laterality: N/A;   watchmen procedure     02/2022    Current Outpatient Medications  Medication Sig Dispense Refill   amLODipine  (NORVASC ) 10 MG tablet TAKE 1 TABLET BY MOUTH EVERY DAY 90 tablet 1   ASPIRIN  LOW DOSE 81 MG tablet TAKE 1 TABLET (81 MG TOTAL) BY MOUTH DAILY. SWALLOW WHOLE. 90 tablet 3   atorvastatin  (LIPITOR) 10 MG tablet TAKE 1 TABLET BY MOUTH EVERY DAY 90 tablet 3   Blood Glucose Calibration (ONETOUCH VERIO) SOLN 1 Act by In Vitro route in the morning and at bedtime. 1 each 5   Blood Glucose Monitoring Suppl (ONETOUCH VERIO FLEX SYSTEM) w/Device KIT 1 Act by Does not apply route in the morning and at bedtime. 1 kit 3   diphenhydrAMINE  HCl, Sleep, (SLEEP-AID) 50 MG CAPS Take 50 mg by mouth at bedtime.     DULoxetine  (CYMBALTA ) 60 MG capsule TAKE 1 CAPSULE BY MOUTH EVERY DAY 90 capsule 3   FARXIGA  10 MG TABS tablet TAKE 1 TABLET BY MOUTH DAILY BEFORE BREAKFAST. 90 tablet 1   glucose blood (ONETOUCH VERIO) test strip 1 each by Other route in the morning and at bedtime. Use as instructed 100 each 12   irbesartan  (AVAPRO ) 300 MG tablet TAKE 1 TABLET BY MOUTH EVERY DAY 90 tablet 1   Lancets (ONETOUCH ULTRASOFT) lancets 1 each by Other route in the morning and at bedtime. Use as instructed 100 each 5   levothyroxine  (SYNTHROID ) 75 MCG tablet TAKE 1 TABLET BY MOUTH EVERY DAY 90 tablet 3   metoprolol  tartrate (LOPRESSOR ) 25 MG tablet Take 1 tablet (25 mg total) by mouth 2 (two) times daily. 180 tablet 3   pantoprazole   (PROTONIX ) 40 MG tablet TAKE 1 TABLET BY MOUTH EVERY DAY 90 tablet 1   spironolactone  (ALDACTONE ) 25 MG tablet Take 25 mg by mouth once.     Current Facility-Administered Medications  Medication Dose Route Frequency Provider Last Rate Last Admin   botulinum toxin Type A  (BOTOX ) injection 100 Units  100 Units Intramuscular Once Yan, Yijun, MD        Allergies as of 12/28/2023 - Review Complete 12/20/2023  Allergen Reaction Noted   Amlodipine  Swelling 06/09/2020   Demeclocycline Rash 05/07/2014   Doxycycline Rash 05/07/2014   Erythromycin Rash 05/07/2014   Lisinopril Cough 06/29/2016   Tetracyclines & related Rash 05/07/2014    Family History  Problem Relation Age of Onset   Hypertension Mother    Diabetes Mother    Heart attack Mother    Colon polyps Mother    Arthritis Mother  Heart disease Mother    Vision loss Mother    Anemia Mother    Heart failure Mother    Hypertension Father    Heart attack Father    Stroke Father    Breast cancer Sister 51   Cancer Sister    Diabetes Sister    Breast cancer Maternal Aunt        in 28's   Breast cancer Maternal Aunt 25 - 59   Lung cancer Maternal Uncle    Cancer Maternal Uncle    Hypertension Brother    Diabetes Brother    Dementia Brother    Stroke Brother    Heart attack Brother    Prostate cancer Brother    Heart disease Brother    Kidney disease Brother    Heart failure Brother    Hypertension Son    Heart disease Brother    Hypertension Brother    Heart attack Brother    Heart failure Brother    Hearing loss Son    Hypertension Son    Colon cancer Neg Hx    Esophageal cancer Neg Hx    Rectal cancer Neg Hx    Stomach cancer Neg Hx     Social History   Socioeconomic History   Marital status: Married    Spouse name: Not on file   Number of children: 2   Years of education: 12   Highest education level: 12th grade  Occupational History   Occupation: Art Therapist  Tobacco Use   Smoking status: Never    Smokeless tobacco: Never  Vaping Use   Vaping status: Never Used  Substance and Sexual Activity   Alcohol use: No   Drug use: No   Sexual activity: Not Currently    Birth control/protection: Abstinence, Post-menopausal  Other Topics Concern   Not on file  Social History Narrative   Lives at home with husband in [place].   Right-handed.   No caffeine use.   Manages a call center   Social Drivers of Health   Financial Resource Strain: Low Risk  (12/16/2023)   Overall Financial Resource Strain (CARDIA)    Difficulty of Paying Living Expenses: Not hard at all  Food Insecurity: No Food Insecurity (12/16/2023)   Hunger Vital Sign    Worried About Running Out of Food in the Last Year: Never true    Ran Out of Food in the Last Year: Never true  Transportation Needs: No Transportation Needs (12/16/2023)   PRAPARE - Administrator, Civil Service (Medical): No    Lack of Transportation (Non-Medical): No  Physical Activity: Insufficiently Active (12/16/2023)   Exercise Vital Sign    Days of Exercise per Week: 1 day    Minutes of Exercise per Session: 60 min  Stress: No Stress Concern Present (12/16/2023)   [employer] of Occupational Health - Occupational Stress Questionnaire    Feeling of Stress: Only a little  Social Connections: Socially Integrated (12/16/2023)   Social Connection and Isolation Panel    Frequency of Communication with Friends and Family: Three times a week    Frequency of Social Gatherings with Friends and Family: Three times a week    Attends Religious Services: More than 4 times per year    Active Member of Clubs or Organizations: Yes    Attends Banker Meetings: More than 4 times per year    Marital Status: Married  Catering Manager Violence: Not At Risk (07/08/2023)   Humiliation, Afraid, Rape,  and Kick questionnaire    Fear of Current or Ex-Partner: No    Emotionally Abused: No    Physically Abused: No    Sexually Abused: No     Review of Systems:    Constitutional: No weight loss, fever, chills, weakness or fatigue HEENT: Eyes: No change in vision               Ears, Nose, Throat:  No change in hearing or congestion Skin: No rash or itching Cardiovascular: No chest pain, chest pressure or palpitations   Respiratory: No SOB or cough Gastrointestinal: See HPI and otherwise negative Genitourinary: No dysuria or change in urinary frequency Neurological: No headache, dizziness or syncope Musculoskeletal: No new muscle or joint pain Hematologic: No bleeding or bruising Psychiatric: No history of depression or anxiety    Physical Exam:  Vital signs: There were no vitals taken for this visit.  Constitutional:   Pleasant Caucasian female appears to be in NAD, Well developed, Well nourished, alert and cooperative Head:  Normocephalic and atraumatic. Eyes:   PEERL, EOMI. No icterus. Conjunctiva pink. Ears:  Normal auditory acuity. Neck:  Supple Throat: Oral cavity and pharynx without inflammation, swelling or lesion.  Respiratory: Respirations even and unlabored. Lungs clear to auscultation bilaterally.   No wheezes, crackles, or rhonchi.  Cardiovascular: Normal S1, S2. No MRG. Regular rate and rhythm. No peripheral edema, cyanosis or pallor.  Gastrointestinal:  Soft, nondistended, nontender. No rebound or guarding. Normal bowel sounds. No appreciable masses or hepatomegaly. Rectal:  Not performed.  Msk:  Symmetrical without gross deformities. Without edema, no deformity or joint abnormality.  Neurologic:  Alert and  oriented x4;  grossly normal neurologically.  Skin:   Dry and intact without significant lesions or rashes. Psychiatric: Oriented to person, place and time. Demonstrates good judgement and reason without abnormal affect or behaviors.  RELEVANT LABS AND IMAGING: CBC    Component Value Date/Time   WBC 8.3 11/30/2023 1448   RBC 4.98 11/30/2023 1448   HGB 14.2 11/30/2023 1448   HGB 11.7  03/01/2022 1047   HCT 44.0 11/30/2023 1448   HCT 35.8 03/01/2022 1047   PLT 359.0 11/30/2023 1448   PLT 315 03/01/2022 1047   MCV 88.3 11/30/2023 1448   MCV 87 03/01/2022 1047   MCH 28.3 03/01/2022 1047   MCH 28.0 01/05/2019 1512   MCHC 32.4 11/30/2023 1448   RDW 15.3 11/30/2023 1448   RDW 14.5 03/01/2022 1047   LYMPHSABS 1.9 12/17/2020 0934   LYMPHSABS 2.4 04/27/2017 0824   MONOABS 0.5 12/17/2020 0934   EOSABS 0.3 12/17/2020 0934   EOSABS 0.2 04/27/2017 0824   BASOSABS 0.1 12/17/2020 0934   BASOSABS 0.0 04/27/2017 0824    CMP     Component Value Date/Time   NA 138 12/20/2023 1133   NA 139 04/07/2022 1402   K 4.1 12/20/2023 1133   CL 103 12/20/2023 1133   CO2 26 12/20/2023 1133   GLUCOSE 103 (H) 12/20/2023 1133   BUN 30 (H) 12/20/2023 1133   BUN 26 04/07/2022 1402   CREATININE 1.81 (H) 12/20/2023 1133   CREATININE 2.26 (H) 12/02/2023 1200   CALCIUM  9.8 12/20/2023 1133   PROT 6.9 11/30/2023 1448   ALBUMIN 4.6 11/30/2023 1448   AST 12 11/30/2023 1448   ALT 20 11/30/2023 1448   ALKPHOS 64 11/30/2023 1448   BILITOT 0.5 11/30/2023 1448   GFRNONAA >60 02/02/2019 1244   GFRAA >60 02/02/2019 1244    Assessment: 1.  IBS-mixed: Radiates back-and-forth  from constipation to loose sticky mushy stool, going on for years, now some fecal incontinence, colonoscopy in 2020 referred to pelvic floor physical therapy, did not help, experiencing incontinence 2-3 times a week; likely due to pelvic floor dysfunction +/- IBS +/- decreased sphincter tone and hemorrhoids 2.  History of adenomatous polyps: Colonoscopy in 2020 with repeat recommended in 7 years 3.  Grade 2 hemorrhoids: Seen on exam in September  Plan: 1. ***     Delon Failing, PA-C [facility] 12/27/2023, 1:12 PM  Cc: Berneta Elsie Sim DEWAINE

## 2023-12-28 ENCOUNTER — Encounter: Payer: Self-pay | Admitting: Physician Assistant

## 2023-12-28 ENCOUNTER — Ambulatory Visit: Admitting: Physician Assistant

## 2023-12-28 VITALS — BP 108/70 | HR 63 | Ht 67.0 in | Wt 177.5 lb

## 2023-12-28 DIAGNOSIS — K641 Second degree hemorrhoids: Secondary | ICD-10-CM | POA: Diagnosis not present

## 2023-12-28 DIAGNOSIS — M6289 Other specified disorders of muscle: Secondary | ICD-10-CM

## 2023-12-28 DIAGNOSIS — R159 Full incontinence of feces: Secondary | ICD-10-CM

## 2023-12-28 DIAGNOSIS — R1013 Epigastric pain: Secondary | ICD-10-CM

## 2023-12-28 DIAGNOSIS — R152 Fecal urgency: Secondary | ICD-10-CM

## 2023-12-28 DIAGNOSIS — K219 Gastro-esophageal reflux disease without esophagitis: Secondary | ICD-10-CM | POA: Diagnosis not present

## 2023-12-28 MED ORDER — PANTOPRAZOLE SODIUM 40 MG PO TBEC: 40.0000 mg | DELAYED_RELEASE_TABLET | Freq: Every day | ORAL | 1 refills | Status: AC

## 2023-12-28 NOTE — Patient Instructions (Signed)
 Imodium- 1/2  tablet once in the morning after having morning bowel movement.   We have sent the following medications to your pharmacy for you to pick up at your convenience: Pantoprazole    Increase your Pepcid  to 20 mg twice daily for 2 weeks.   _______________________________________________________  If your blood pressure at your visit was 140/90 or greater, please contact your primary care physician to follow up on this.  _______________________________________________________  If you are age 72 or older, your body mass index should be between 23-30. Your Body mass index is 27.8 kg/m. If this is out of the aforementioned range listed, please consider follow up with your Primary Care Provider.  If you are age 72 or younger, your body mass index should be between 19-25. Your Body mass index is 27.8 kg/m. If this is out of the aformentioned range listed, please consider follow up with your Primary Care Provider.   ________________________________________________________  The [facility] providers would like to encourage you to use MYCHART to communicate with providers for non-urgent requests or questions.  Due to long hold times on the telephone, sending your provider a message by [facility] may be a faster and more efficient way to get a response.  Please allow 48 business hours for a response.  Please remember that this is for non-urgent requests.  _______________________________________________________  [facility] is using a team-based approach to care.  Your team is made up of your doctor and two to three APPS. Our APPS (Nurse Practitioners and Physician Assistants) work with your physician to ensure care continuity for you. They are fully qualified to address your health concerns and develop a treatment plan. They communicate directly with your gastroenterologist to care for you. Seeing the Advanced Practice Practitioners on your physician's team can help you by facilitating  care more promptly, often allowing for earlier appointments, access to diagnostic testing, procedures, and other specialty referrals.   Thank you for choosing me and  Gastroenterology.  Delon Failing, PA-C

## 2023-12-30 ENCOUNTER — Other Ambulatory Visit: Payer: Self-pay | Admitting: Family Medicine

## 2023-12-30 DIAGNOSIS — E119 Type 2 diabetes mellitus without complications: Secondary | ICD-10-CM

## 2024-01-04 ENCOUNTER — Telehealth: Payer: Self-pay

## 2024-01-04 NOTE — Telephone Encounter (Signed)
 Left voicemail for patient to return call to see how she is doing 2 year post LAAO.

## 2024-01-06 DIAGNOSIS — N1832 Chronic kidney disease, stage 3b: Secondary | ICD-10-CM | POA: Diagnosis not present

## 2024-01-06 NOTE — Telephone Encounter (Signed)
 Called to check in with patient, who had LAAO on 03/04/2022. The patient reports doing well with no issues.  The patient understands to call with questions or concerns.

## 2024-01-16 ENCOUNTER — Ambulatory Visit: Admitting: Family Medicine

## 2024-02-20 ENCOUNTER — Telehealth

## 2024-02-20 DIAGNOSIS — B9689 Other specified bacterial agents as the cause of diseases classified elsewhere: Secondary | ICD-10-CM

## 2024-02-20 DIAGNOSIS — J019 Acute sinusitis, unspecified: Secondary | ICD-10-CM

## 2024-02-20 MED ORDER — AMOXICILLIN-POT CLAVULANATE 875-125 MG PO TABS: 1.0000 | ORAL_TABLET | Freq: Two times a day (BID) | ORAL | 0 refills | Status: DC

## 2024-02-20 NOTE — Progress Notes (Signed)
 " Virtual Visit Consent   Ashley Woodard, you are scheduled for a virtual visit with a Ashley Woodard provider today. Just as with appointments in the office, your consent must be obtained to participate. Your consent will be active for this visit and any virtual visit you may have with one of our providers in the next 365 days. If you have a MyChart account, a copy of this consent can be sent to you electronically.  As this is a virtual visit, video technology does not allow for your provider to perform a traditional examination. This may limit your provider's ability to fully assess your condition. If your provider identifies any concerns that need to be evaluated in person or the need to arrange testing (such as labs, EKG, etc.), we will make arrangements to do so. Although advances in technology are sophisticated, we cannot ensure that it will always work on either your end or our end. If the connection with a video visit is poor, the visit may have to be switched to a telephone visit. With either a video or telephone visit, we are not always able to ensure that we have a secure connection.  By engaging in this virtual visit, you consent to the provision of healthcare and authorize for your insurance to be billed (if applicable) for the services provided during this visit. Depending on your insurance coverage, you may receive a charge related to this service.  I need to obtain your verbal consent now. Are you willing to proceed with your visit today? Ashley Woodard has provided verbal consent on 72/29/2025 for a virtual visit (video or telephone). Ashley CHRISTELLA Dickinson, PA-C  Date: 02/20/2024 8:27 AM   Virtual Visit via Video Note   I, Ashley Woodard, connected with  Ashley Woodard  ([id number], [birth year]) on 02/20/2024 at  8:15 AM EST by a video-enabled telemedicine application and verified that I am speaking with the correct person using two identifiers.  Location: Patient: Virtual Visit Location Patient:  Home Provider: Virtual Visit Location Provider: Home Office   I discussed the limitations of evaluation and management by telemedicine and the availability of in person appointments. The patient expressed understanding and agreed to proceed.    History of Present Illness: Ashley Woodard is a 73 y.o. who identifies as a female who was assigned female at birth, and is being seen today for sinus congestion.  HPI: Sinusitis This is a new problem. The current episode started 1 to 4 weeks ago (1.5 weeks, around 02/06/24). The problem has been gradually worsening since onset. There has been no fever. Associated symptoms include congestion, coughing, ear pain (started yesterday), headaches, a hoarse voice and sinus pressure. Pertinent negatives include no chills, diaphoresis, shortness of breath, sneezing, sore throat (does have a tickle from drainage) or swollen glands. Treatments tried: cold max, throat lozenges. The treatment provided no relief.     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   Flu vaccine need 12/17/2020   Estrogen deficiency 05/15/2020   Hyperlipidemia LDL goal <70 05/12/2020   Recurrent major depressive disorder, in full remission 05/12/2020   Stage 3a chronic kidney disease (HCC) 03/27/2020   Dyslipidemia, goal LDL below 100 03/27/2020   Diuretic-induced hypokalemia 03/27/2020   Primary  hypertension 03/26/2020   PAF (paroxysmal atrial fibrillation) (HCC) 05/10/2017   Hypothyroidism 02/24/2017   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  Resting comfortably at home.  Head is normocephalic, atraumatic.  No labored breathing.  Speech is clear and coherent with logical content.  Patient is alert and oriented at baseline.    Assessment and Plan: 1. Acute bacterial sinusitis (Primary) - amoxicillin -clavulanate (AUGMENTIN ) 875-125 MG tablet; Take 1 tablet by mouth 2 (two) times daily.  Dispense: 14 tablet; Refill: 0  - Worsening symptoms that have not responded to OTC medications.  - Will give Augmentin  - Continue allergy medications.  - Steam and humidifier can help - Stay well hydrated and get plenty of rest.  - Seek in person evaluation if no symptom improvement or if symptoms worsen   Follow Up Instructions: I discussed the assessment and treatment plan with the patient. The patient was provided an opportunity to ask questions and all were answered. The patient agreed with the plan and demonstrated an understanding of the instructions.  A copy of instructions were sent to the patient via MyChart unless otherwise noted below.    The patient was advised to call back or seek an in-person evaluation if the symptoms worsen or if the condition fails to improve as anticipated.    Ashley CHRISTELLA Dickinson, PA-C     [1]  Allergies Allergen Reactions   Demeclocycline Rash   Doxycycline Rash   Erythromycin Rash   Lisinopril Cough   Tetracyclines & Related Rash  [2]  Current Outpatient Medications:    amoxicillin -clavulanate (AUGMENTIN ) 875-125 MG tablet, Take 1 tablet by mouth 2 (two) times daily., Disp: 14 tablet, Rfl: 0   amLODipine  (NORVASC ) 10 MG tablet, TAKE 1 TABLET BY MOUTH EVERY DAY, Disp: 90 tablet, Rfl: 1   ASPIRIN  LOW DOSE 81 MG tablet, TAKE 1 TABLET (81 MG TOTAL) BY MOUTH DAILY. SWALLOW WHOLE., Disp: 90 tablet, Rfl: 3   atorvastatin  (LIPITOR) 10 MG tablet, TAKE 1 TABLET BY MOUTH EVERY DAY, Disp: 90 tablet, Rfl: 3   Blood Glucose Calibration (ONETOUCH VERIO)  SOLN, 1 Act by In Vitro route in the morning and at bedtime., Disp: 1 each, Rfl: 5   Blood Glucose Monitoring Suppl (ONETOUCH VERIO FLEX SYSTEM) w/Device KIT, 1 Act by Does not apply route in the morning and at bedtime., Disp: 1 kit, Rfl: 3   diphenhydrAMINE  HCl, Sleep, (SLEEP-AID) 50 MG CAPS, Take 50 mg by mouth at bedtime., Disp: , Rfl:    DULoxetine  (CYMBALTA ) 60 MG capsule, TAKE 1 CAPSULE BY MOUTH EVERY DAY, Disp: 90 capsule, Rfl: 3   FARXIGA  10 MG TABS tablet, TAKE 1 TABLET BY MOUTH DAILY BEFORE BREAKFAST., Disp: 90 tablet, Rfl: 1   glucose blood (ONETOUCH VERIO) test strip, 1 each by Other route in the morning and at bedtime. Use as instructed, Disp: 100 each, Rfl: 12   irbesartan  (AVAPRO ) 300 MG tablet, TAKE 1 TABLET BY MOUTH EVERY DAY (Patient not taking: Reported on 12/28/2023), Disp: 90 tablet, Rfl: 1   Lancets (ONETOUCH ULTRASOFT) lancets, 1 each by Other route in the morning and at bedtime. Use as instructed, Disp: 100 each, Rfl: 5   levothyroxine  (SYNTHROID ) 75 MCG tablet, TAKE 1 TABLET BY MOUTH EVERY DAY, Disp: 90 tablet, Rfl: 3   metoprolol  tartrate (LOPRESSOR ) 25 MG tablet, Take 1 tablet (25  mg total) by mouth 2 (two) times daily., Disp: 180 tablet, Rfl: 3   pantoprazole  (PROTONIX ) 40 MG tablet, Take 1 tablet (40 mg total) by mouth daily., Disp: 90 tablet, Rfl: 1   spironolactone  (ALDACTONE ) 25 MG tablet, Take 25 mg by mouth once., Disp: , Rfl:    Wheat Dextrin (BENEFIBER) TABS, , Disp: , Rfl:   Current Facility-Administered Medications:    botulinum toxin Type A  (BOTOX ) injection 100 Units, 100 Units, Intramuscular, Once, Onita Duos, MD  "

## 2024-02-20 NOTE — Patient Instructions (Signed)
 " [employer], thank you for joining Delon CHRISTELLA Dickinson, PA-C for today's virtual visit.  While this provider is not your primary care provider (PCP), if your PCP is located in our provider database this encounter information will be shared with them immediately following your visit.   A [facility] MyChart account gives you access to today's visit and all your visits, tests, and labs performed at [facility]  click here if you don't have a [facility] MyChart account or go to [email or web address]  Consent: (Patient) Ashley Woodard provided verbal consent for this virtual visit at the beginning of the encounter.  Current Medications:  Current Outpatient Medications:    amoxicillin -clavulanate (AUGMENTIN ) 875-125 MG tablet, Take 1 tablet by mouth 2 (two) times daily., Disp: 14 tablet, Rfl: 0   amLODipine  (NORVASC ) 10 MG tablet, TAKE 1 TABLET BY MOUTH EVERY DAY, Disp: 90 tablet, Rfl: 1   ASPIRIN  LOW DOSE 81 MG tablet, TAKE 1 TABLET (81 MG TOTAL) BY MOUTH DAILY. SWALLOW WHOLE., Disp: 90 tablet, Rfl: 3   atorvastatin  (LIPITOR) 10 MG tablet, TAKE 1 TABLET BY MOUTH EVERY DAY, Disp: 90 tablet, Rfl: 3   Blood Glucose Calibration (ONETOUCH VERIO) SOLN, 1 Act by In Vitro route in the morning and at bedtime., Disp: 1 each, Rfl: 5   Blood Glucose Monitoring Suppl (ONETOUCH VERIO FLEX SYSTEM) w/Device KIT, 1 Act by Does not apply route in the morning and at bedtime., Disp: 1 kit, Rfl: 3   diphenhydrAMINE  HCl, Sleep, (SLEEP-AID) 50 MG CAPS, Take 50 mg by mouth at bedtime., Disp: , Rfl:    DULoxetine  (CYMBALTA ) 60 MG capsule, TAKE 1 CAPSULE BY MOUTH EVERY DAY, Disp: 90 capsule, Rfl: 3   FARXIGA  10 MG TABS tablet, TAKE 1 TABLET BY MOUTH DAILY BEFORE BREAKFAST., Disp: 90 tablet, Rfl: 1   glucose blood (ONETOUCH VERIO) test strip, 1 each by Other route in the morning and at bedtime. Use as instructed, Disp: 100 each, Rfl: 12   irbesartan  (AVAPRO ) 300 MG tablet, TAKE 1 TABLET BY MOUTH EVERY DAY (Patient  not taking: Reported on 12/28/2023), Disp: 90 tablet, Rfl: 1   Lancets (ONETOUCH ULTRASOFT) lancets, 1 each by Other route in the morning and at bedtime. Use as instructed, Disp: 100 each, Rfl: 5   levothyroxine  (SYNTHROID ) 75 MCG tablet, TAKE 1 TABLET BY MOUTH EVERY DAY, Disp: 90 tablet, Rfl: 3   metoprolol  tartrate (LOPRESSOR ) 25 MG tablet, Take 1 tablet (25 mg total) by mouth 2 (two) times daily., Disp: 180 tablet, Rfl: 3   pantoprazole  (PROTONIX ) 40 MG tablet, Take 1 tablet (40 mg total) by mouth daily., Disp: 90 tablet, Rfl: 1   spironolactone  (ALDACTONE ) 25 MG tablet, Take 25 mg by mouth once., Disp: , Rfl:    Wheat Dextrin (BENEFIBER) TABS, , Disp: , Rfl:   Current Facility-Administered Medications:    botulinum toxin Type A  (BOTOX ) injection 100 Units, 100 Units, Intramuscular, Once, Onita Duos, MD   Medications ordered in this encounter:  Meds ordered this encounter  Medications   amoxicillin -clavulanate (AUGMENTIN ) 875-125 MG tablet    Sig: Take 1 tablet by mouth 2 (two) times daily.    Dispense:  14 tablet    Refill:  0    Supervising Provider:   BLAISE ALEENE KIDD [[id number]]     *If you need refills on other medications prior to your next appointment, please contact your pharmacy*  Follow-Up: Call back or seek an in-person evaluation if the symptoms worsen or if the condition fails to  improve as anticipated.  [facility] [phone]  Other Instructions Sinus Infection, Adult A sinus infection, also called sinusitis, is inflammation of your sinuses. Sinuses are hollow spaces in the bones around your face. Your sinuses are located: Around your eyes. In the middle of your forehead. Behind your nose. In your cheekbones. Mucus normally drains out of your sinuses. When your nasal tissues become inflamed or swollen, mucus can become trapped or blocked. This allows bacteria, viruses, and fungi to grow, which leads to infection. Most infections of the sinuses are  caused by a virus. A sinus infection can develop quickly. It can last for up to 4 weeks (acute) or for more than 12 weeks (chronic). A sinus infection often develops after a cold. What are the causes? This condition is caused by anything that creates swelling in the sinuses or stops mucus from draining. This includes: Allergies. Asthma. Infection from bacteria or viruses. Deformities or blockages in your nose or sinuses. Abnormal growths in the nose (nasal polyps). Pollutants, such as chemicals or irritants in the air. Infection from fungi. This is rare. What increases the risk? You are more likely to develop this condition if you: Have a weak body defense system (immune system). Do a lot of swimming or diving. Overuse nasal sprays. Smoke. What are the signs or symptoms? The main symptoms of this condition are pain and a feeling of pressure around the affected sinuses. Other symptoms include: Stuffy nose or congestion that makes it difficult to breathe through your nose. Thick yellow or greenish drainage from your nose. Tenderness, swelling, and warmth over the affected sinuses. A cough that may get worse at night. Decreased sense of smell and taste. Extra mucus that collects in the throat or the back of the nose (postnasal drip) causing a sore throat or bad breath. Tiredness (fatigue). Fever. How is this diagnosed? This condition is diagnosed based on: Your symptoms. Your medical history. A physical exam. Tests to find out if your condition is acute or chronic. This may include: Checking your nose for nasal polyps. Viewing your sinuses using a device that has a light (endoscope). Testing for allergies or bacteria. Imaging tests, such as an MRI or CT scan. In rare cases, a bone biopsy may be done to rule out more serious types of fungal sinus disease. How is this treated? Treatment for a sinus infection depends on the cause and whether your condition is chronic or acute. If  caused by a virus, your symptoms should go away on their own within 10 days. You may be given medicines to relieve symptoms. They include: Medicines that shrink swollen nasal passages (decongestants). A spray that eases inflammation of the nostrils (topical intranasal corticosteroids). Rinses that help get rid of thick mucus in your nose (nasal saline washes). Medicines that treat allergies (antihistamines). Over-the-counter pain relievers. If caused by bacteria, your health care provider may recommend waiting to see if your symptoms improve. Most bacterial infections will get better without antibiotic medicine. You may be given antibiotics if you have: A severe infection. A weak immune system. If caused by narrow nasal passages or nasal polyps, surgery may be needed. Follow these instructions at home: Medicines Take, use, or apply over-the-counter and prescription medicines only as told by your health care provider. These may include nasal sprays. If you were prescribed an antibiotic medicine, take it as told by your health care provider. Do not stop taking the antibiotic even if you start to feel better. Hydrate and humidify  Drink enough fluid to keep your urine pale yellow. Staying hydrated will help to thin your mucus. Use a cool mist humidifier to keep the humidity level in your home above 50%. Inhale steam for 10-15 minutes, 3-4 times a day, or as told by your health care provider. You can do this in the bathroom while a hot shower is running. Limit your exposure to cool or dry air. Rest Rest as much as possible. Sleep with your head raised (elevated). Make sure you get enough sleep each night. General instructions  Apply a warm, moist washcloth to your face 3-4 times a day or as told by your health care provider. This will help with discomfort. Use nasal saline washes as often as told by your health care provider. Wash your hands often with soap and water to reduce your exposure  to germs. If soap and water are not available, use hand sanitizer. Do not smoke. Avoid being around people who are smoking (secondhand smoke). Keep all follow-up visits. This is important. Contact a health care provider if: You have a fever. Your symptoms get worse. Your symptoms do not improve within 10 days. Get help right away if: You have a severe headache. You have persistent vomiting. You have severe pain or swelling around your face or eyes. You have vision problems. You develop confusion. Your neck is stiff. You have trouble breathing. These symptoms may be an emergency. Get help right away. Call 911. Do not wait to see if the symptoms will go away. Do not drive yourself to the hospital. Summary A sinus infection is soreness and inflammation of your sinuses. Sinuses are hollow spaces in the bones around your face. This condition is caused by nasal tissues that become inflamed or swollen. The swelling traps or blocks the flow of mucus. This allows bacteria, viruses, and fungi to grow, which leads to infection. If you were prescribed an antibiotic medicine, take it as told by your health care provider. Do not stop taking the antibiotic even if you start to feel better. Keep all follow-up visits. This is important. This information is not intended to replace advice given to you by your health care provider. Make sure you discuss any questions you have with your health care provider. Document Revised: 01/13/2021 Document Reviewed: 01/13/2021 Elsevier Patient Education  2024 Elsevier Inc.   If you have been instructed to have an in-person evaluation today at a local Urgent Care facility, please use the link below. It will take you to a list of all of our available [facility], including address, phone number and hours of operation. Please do not delay care.  [facility]  If you or a family member do not have a primary care provider, use the link below to  schedule a visit and establish care. When you choose a [facility] primary care physician or advanced practice provider, you gain a long-term partner in health. Find a Primary Care Provider  Learn more about [facility] in-office and virtual care options: [facility] Get Care Now "

## 2024-02-21 ENCOUNTER — Ambulatory Visit: Admitting: Family Medicine

## 2024-02-24 ENCOUNTER — Other Ambulatory Visit: Payer: Self-pay | Admitting: Internal Medicine

## 2024-03-22 ENCOUNTER — Ambulatory Visit: Payer: Self-pay | Admitting: Family Medicine

## 2024-03-22 ENCOUNTER — Encounter: Payer: Self-pay | Admitting: Family Medicine

## 2024-03-22 ENCOUNTER — Ambulatory Visit: Admitting: Family Medicine

## 2024-03-22 VITALS — BP 106/70 | HR 80 | Temp 97.9°F | Ht 67.0 in | Wt 185.6 lb

## 2024-03-22 DIAGNOSIS — E119 Type 2 diabetes mellitus without complications: Secondary | ICD-10-CM

## 2024-03-22 DIAGNOSIS — I10 Essential (primary) hypertension: Secondary | ICD-10-CM

## 2024-03-22 DIAGNOSIS — E875 Hyperkalemia: Secondary | ICD-10-CM

## 2024-03-22 DIAGNOSIS — N1832 Chronic kidney disease, stage 3b: Secondary | ICD-10-CM | POA: Diagnosis not present

## 2024-03-22 LAB — BASIC METABOLIC PANEL WITH GFR
BUN: 42 mg/dL — ABNORMAL HIGH (ref 6–23)
CO2: 26 meq/L (ref 19–32)
Calcium: 10 mg/dL (ref 8.4–10.5)
Chloride: 108 meq/L (ref 96–112)
Creatinine, Ser: 1.98 mg/dL — ABNORMAL HIGH (ref 0.40–1.20)
GFR: 24.73 mL/min — ABNORMAL LOW
Glucose, Bld: 111 mg/dL — ABNORMAL HIGH (ref 70–99)
Potassium: 5.3 meq/L — ABNORMAL HIGH (ref 3.5–5.1)
Sodium: 137 meq/L (ref 135–145)

## 2024-03-22 LAB — HEMOGLOBIN A1C: Hgb A1c MFr Bld: 6.8 % — ABNORMAL HIGH (ref 4.6–6.5)

## 2024-03-22 MED ORDER — RYBELSUS 3 MG PO TABS: 3.0000 mg | ORAL_TABLET | Freq: Every day | ORAL | 4 refills | Status: AC

## 2024-03-22 NOTE — Progress Notes (Addendum)
 "  Established Patient Office Visit   Subjective:  Patient ID: Ashley Woodard, female    DOB: [birth year]  Age: 74 y.o. MRN: [id number]  Chief Complaint  Patient presents with   Medical Management of Chronic Issues    Three month follow-up     HPI Encounter Diagnoses  Name Primary?   Stage 3b chronic kidney disease (HCC) Yes   Type 2 diabetes mellitus without complication, without long-term current use of insulin  (HCC)    Primary hypertension    Hyperkalemia    Follow-up of above.  Blood pressure remains stable in the 115/60 range with current regimen.  Nephrology discontinued the Avapro .  She continues with metoprolol  tartrate 25 twice daily and Aldactone  25 mg daily.  Patient is interested in trying semaglutide  again.  She had not done well with the injections but would be interested in alternative formulations.    Review of Systems  Constitutional: Negative.   HENT: Negative.    Eyes:  Negative for blurred vision, discharge and redness.  Respiratory: Negative.    Cardiovascular: Negative.   Gastrointestinal:  Negative for abdominal pain.  Genitourinary: Negative.   Musculoskeletal: Negative.  Negative for myalgias.  Skin:  Negative for rash.  Neurological:  Negative for tingling, loss of consciousness and weakness.  Endo/Heme/Allergies:  Negative for polydipsia.    Current Medications[1]   Objective:     BP 106/70   Pulse 80   Temp 97.9 F (36.6 C)   Ht 5' 7 (1.702 m)   Wt 185 lb 9.6 oz (84.2 kg)   SpO2 96%   BMI 29.07 kg/m  BP Readings from Last 3 Encounters:  03/22/24 106/70  12/28/23 108/70  12/20/23 130/72   Wt Readings from Last 3 Encounters:  03/22/24 185 lb 9.6 oz (84.2 kg)  12/28/23 177 lb 8 oz (80.5 kg)  12/20/23 179 lb 12.8 oz (81.6 kg)      Physical Exam Constitutional:      General: She is not in acute distress.    Appearance: Normal appearance. She is not ill-appearing, toxic-appearing or diaphoretic.  HENT:     Head: Normocephalic and  atraumatic.     Right Ear: External ear normal.     Left Ear: External ear normal.  Eyes:     General: No scleral icterus.       Right eye: No discharge.        Left eye: No discharge.     Extraocular Movements: Extraocular movements intact.     Conjunctiva/sclera: Conjunctivae normal.  Pulmonary:     Effort: Pulmonary effort is normal. No respiratory distress.  Skin:    General: Skin is warm and dry.  Neurological:     Mental Status: She is alert and oriented to person, place, and time.  Psychiatric:        Mood and Affect: Mood normal.        Behavior: Behavior normal.      Results for orders placed or performed in visit on 03/22/24  Basic metabolic panel with GFR  Result Value Ref Range   Sodium 137 135 - 145 mEq/L   Potassium 5.3 No hemolysis seen (H) 3.5 - 5.1 mEq/L   Chloride 108 96 - 112 mEq/L   CO2 26 19 - 32 mEq/L   Glucose, Bld 111 (H) 70 - 99 mg/dL   BUN 42 (H) 6 - 23 mg/dL   Creatinine, Ser 8.01 (H) 0.40 - 1.20 mg/dL   GFR 75.26 (L) >39.99 mL/min   Calcium   10.0 8.4 - 10.5 mg/dL  Hemoglobin J8r  Result Value Ref Range   Hgb A1c MFr Bld 6.8 (H) 4.6 - 6.5 %      The ASCVD Risk score (Arnett DK, et al., 2019) failed to calculate for the following reasons:   The valid total cholesterol range is 130 to 320 mg/dL    Assessment & Plan:   Stage 3b chronic kidney disease (HCC) -     Rybelsus ; Take 1 tablet (3 mg total) by mouth daily.  Dispense: 30 tablet; Refill: 4 -     Basic metabolic panel with GFR  Type 2 diabetes mellitus without complication, without long-term current use of insulin  (HCC) -     Rybelsus ; Take 1 tablet (3 mg total) by mouth daily.  Dispense: 30 tablet; Refill: 4 -     Basic metabolic panel with GFR -     Hemoglobin A1c  Primary hypertension -     Basic metabolic panel with GFR  Hyperkalemia -     Potassium; Future    Return in about 3 months (around 06/20/2024).  Will try Rybelsus  and low-dose 3 mg for improved diabetes control  and her history of CKD.  She will let me know if she develops any issues taking the medication.  Elsie Sim Lent, MD  1/29 addendum: Labs did show an mildly elevated potassium.  Will bring her in for repeat.  Do have room to decrease dosing of Aldactone .     [1]  Current Outpatient Medications:    amLODipine  (NORVASC ) 10 MG tablet, TAKE 1 TABLET BY MOUTH EVERY DAY, Disp: 90 tablet, Rfl: 1   aspirin  EC (ASPIRIN  LOW DOSE) 81 MG tablet, Take 1 tablet (81 mg total) by mouth daily. SWALLOW WHOLE., Disp: 90 tablet, Rfl: 0   atorvastatin  (LIPITOR) 10 MG tablet, TAKE 1 TABLET BY MOUTH EVERY DAY, Disp: 90 tablet, Rfl: 3   Blood Glucose Calibration (ONETOUCH VERIO) SOLN, 1 Act by In Vitro route in the morning and at bedtime., Disp: 1 each, Rfl: 5   Blood Glucose Monitoring Suppl (ONETOUCH VERIO FLEX SYSTEM) w/Device KIT, 1 Act by Does not apply route in the morning and at bedtime., Disp: 1 kit, Rfl: 3   diphenhydrAMINE  HCl, Sleep, (SLEEP-AID) 50 MG CAPS, Take 50 mg by mouth at bedtime., Disp: , Rfl:    DULoxetine  (CYMBALTA ) 60 MG capsule, TAKE 1 CAPSULE BY MOUTH EVERY DAY, Disp: 90 capsule, Rfl: 3   FARXIGA  10 MG TABS tablet, TAKE 1 TABLET BY MOUTH DAILY BEFORE BREAKFAST., Disp: 90 tablet, Rfl: 1   glucose blood (ONETOUCH VERIO) test strip, 1 each by Other route in the morning and at bedtime. Use as instructed, Disp: 100 each, Rfl: 12   Lancets (ONETOUCH ULTRASOFT) lancets, 1 each by Other route in the morning and at bedtime. Use as instructed, Disp: 100 each, Rfl: 5   levothyroxine  (SYNTHROID ) 75 MCG tablet, TAKE 1 TABLET BY MOUTH EVERY DAY, Disp: 90 tablet, Rfl: 3   metoprolol  tartrate (LOPRESSOR ) 25 MG tablet, Take 1 tablet (25 mg total) by mouth 2 (two) times daily., Disp: 180 tablet, Rfl: 3   pantoprazole  (PROTONIX ) 40 MG tablet, Take 1 tablet (40 mg total) by mouth daily., Disp: 90 tablet, Rfl: 1   Semaglutide  (RYBELSUS ) 3 MG TABS, Take 1 tablet (3 mg total) by mouth daily., Disp: 30 tablet,  Rfl: 4   spironolactone  (ALDACTONE ) 25 MG tablet, Take 25 mg by mouth once., Disp: , Rfl:    Wheat Dextrin (BENEFIBER) TABS, ,  Disp: , Rfl:   Current Facility-Administered Medications:    botulinum toxin Type A  (BOTOX ) injection 100 Units, 100 Units, Intramuscular, Once, Onita Duos, MD  "

## 2024-03-22 NOTE — Addendum Note (Signed)
 Addended by: BERNETA ELSIE LABOR on: 03/22/2024 03:20 PM   Modules accepted: Orders

## 2024-04-16 ENCOUNTER — Other Ambulatory Visit

## 2024-06-19 ENCOUNTER — Ambulatory Visit: Admitting: Family Medicine

## 2024-07-13 ENCOUNTER — Ambulatory Visit
